# Patient Record
Sex: Female | Born: 1945
Health system: Southern US, Community
[De-identification: ages and names within clinical notes are randomized; demographics above are authoritative.]

## PROBLEM LIST (undated history)

## (undated) DIAGNOSIS — R570 Cardiogenic shock: Secondary | ICD-10-CM

## (undated) DIAGNOSIS — E039 Hypothyroidism, unspecified: Secondary | ICD-10-CM

## (undated) DIAGNOSIS — M1990 Unspecified osteoarthritis, unspecified site: Secondary | ICD-10-CM

## (undated) DIAGNOSIS — I10 Essential (primary) hypertension: Secondary | ICD-10-CM

## (undated) DIAGNOSIS — E785 Hyperlipidemia, unspecified: Secondary | ICD-10-CM

## (undated) DIAGNOSIS — E079 Disorder of thyroid, unspecified: Secondary | ICD-10-CM

## (undated) DIAGNOSIS — K635 Polyp of colon: Secondary | ICD-10-CM

## (undated) DIAGNOSIS — R112 Nausea with vomiting, unspecified: Secondary | ICD-10-CM

## (undated) DIAGNOSIS — J189 Pneumonia, unspecified organism: Secondary | ICD-10-CM

## (undated) DIAGNOSIS — Z9889 Other specified postprocedural states: Secondary | ICD-10-CM

## (undated) DIAGNOSIS — K5792 Diverticulitis of intestine, part unspecified, without perforation or abscess without bleeding: Secondary | ICD-10-CM

## (undated) DIAGNOSIS — N390 Urinary tract infection, site not specified: Secondary | ICD-10-CM

## (undated) HISTORY — DX: Disorder of thyroid, unspecified: E07.9

## (undated) HISTORY — DX: Urinary tract infection, site not specified: N39.0

## (undated) HISTORY — DX: Unspecified osteoarthritis, unspecified site: M19.90

## (undated) HISTORY — DX: Polyp of colon: K63.5

## (undated) HISTORY — DX: Diverticulitis of intestine, part unspecified, without perforation or abscess without bleeding: K57.92

## (undated) HISTORY — DX: Hyperlipidemia, unspecified: E78.5

## (undated) HISTORY — DX: Essential (primary) hypertension: I10

---

## 1991-11-03 HISTORY — PX: ABDOMINAL HYSTERECTOMY: SHX81

## 2000-11-02 HISTORY — PX: CHOLECYSTECTOMY: SHX55

## 2007-11-16 LAB — HM COLONOSCOPY: HM Colonoscopy: NORMAL

## 2009-02-18 ENCOUNTER — Encounter: Admission: RE | Admit: 2009-02-18 | Discharge: 2009-02-18 | Payer: Self-pay | Admitting: Family Medicine

## 2010-03-11 ENCOUNTER — Encounter: Admission: RE | Admit: 2010-03-11 | Discharge: 2010-03-11 | Payer: Self-pay | Admitting: Family Medicine

## 2011-02-10 ENCOUNTER — Other Ambulatory Visit: Payer: Self-pay | Admitting: Family Medicine

## 2011-02-10 DIAGNOSIS — Z1231 Encounter for screening mammogram for malignant neoplasm of breast: Secondary | ICD-10-CM

## 2011-03-16 ENCOUNTER — Ambulatory Visit
Admission: RE | Admit: 2011-03-16 | Discharge: 2011-03-16 | Disposition: A | Discharge status: Home / Self Care | Payer: BC Managed Care – PPO | Source: Ambulatory Visit | Attending: Family Medicine | Admitting: Family Medicine

## 2011-03-16 DIAGNOSIS — Z1231 Encounter for screening mammogram for malignant neoplasm of breast: Secondary | ICD-10-CM

## 2011-04-27 ENCOUNTER — Other Ambulatory Visit: Payer: Self-pay | Admitting: Otolaryngology

## 2011-04-27 DIAGNOSIS — J3800 Paralysis of vocal cords and larynx, unspecified: Secondary | ICD-10-CM

## 2011-04-30 ENCOUNTER — Ambulatory Visit
Admission: RE | Admit: 2011-04-30 | Discharge: 2011-04-30 | Disposition: A | Discharge status: Home / Self Care | Payer: BC Managed Care – PPO | Source: Ambulatory Visit | Attending: Otolaryngology | Admitting: Otolaryngology

## 2011-04-30 DIAGNOSIS — J3800 Paralysis of vocal cords and larynx, unspecified: Secondary | ICD-10-CM

## 2011-04-30 MED ORDER — IOHEXOL 300 MG/ML  SOLN
75.0000 mL | Freq: Once | INTRAMUSCULAR | Status: AC | PRN
  Administered 2011-04-30: 75 mL via INTRAVENOUS

## 2011-11-16 LAB — HM MAMMOGRAPHY: HM Mammogram: NEGATIVE

## 2012-02-17 ENCOUNTER — Other Ambulatory Visit: Payer: Self-pay | Admitting: Family Medicine

## 2012-02-17 DIAGNOSIS — Z1231 Encounter for screening mammogram for malignant neoplasm of breast: Secondary | ICD-10-CM

## 2012-03-18 ENCOUNTER — Ambulatory Visit
Admission: RE | Admit: 2012-03-18 | Discharge: 2012-03-18 | Disposition: A | Discharge status: Home / Self Care | Payer: Medicare Other | Source: Ambulatory Visit | Attending: Family Medicine | Admitting: Family Medicine

## 2012-03-18 DIAGNOSIS — Z1231 Encounter for screening mammogram for malignant neoplasm of breast: Secondary | ICD-10-CM

## 2012-03-22 ENCOUNTER — Other Ambulatory Visit: Payer: Self-pay | Admitting: Family Medicine

## 2012-03-22 DIAGNOSIS — R928 Other abnormal and inconclusive findings on diagnostic imaging of breast: Secondary | ICD-10-CM

## 2012-03-30 ENCOUNTER — Ambulatory Visit
Admission: RE | Admit: 2012-03-30 | Discharge: 2012-03-30 | Disposition: A | Discharge status: Home / Self Care | Payer: Medicare Other | Source: Ambulatory Visit | Attending: Family Medicine | Admitting: Family Medicine

## 2012-03-30 DIAGNOSIS — R928 Other abnormal and inconclusive findings on diagnostic imaging of breast: Secondary | ICD-10-CM

## 2012-09-15 ENCOUNTER — Other Ambulatory Visit: Payer: Self-pay | Admitting: Family Medicine

## 2012-09-15 DIAGNOSIS — Z780 Asymptomatic menopausal state: Secondary | ICD-10-CM

## 2012-09-20 ENCOUNTER — Ambulatory Visit
Admission: RE | Admit: 2012-09-20 | Discharge: 2012-09-20 | Disposition: A | Discharge status: Home / Self Care | Payer: Medicare Other | Source: Ambulatory Visit | Attending: Family Medicine | Admitting: Family Medicine

## 2012-09-20 DIAGNOSIS — Z780 Asymptomatic menopausal state: Secondary | ICD-10-CM

## 2012-11-15 ENCOUNTER — Encounter: Payer: Self-pay | Admitting: Family Medicine

## 2012-11-15 ENCOUNTER — Ambulatory Visit (INDEPENDENT_AMBULATORY_CARE_PROVIDER_SITE_OTHER): Payer: Medicare Other | Admitting: Family Medicine

## 2012-11-15 VITALS — BP 120/60 | HR 72 | Temp 98.5°F | Resp 12 | Ht 65.0 in | Wt 141.0 lb

## 2012-11-15 DIAGNOSIS — M899 Disorder of bone, unspecified: Secondary | ICD-10-CM

## 2012-11-15 DIAGNOSIS — K219 Gastro-esophageal reflux disease without esophagitis: Secondary | ICD-10-CM | POA: Insufficient documentation

## 2012-11-15 DIAGNOSIS — Z8719 Personal history of other diseases of the digestive system: Secondary | ICD-10-CM

## 2012-11-15 DIAGNOSIS — M1990 Unspecified osteoarthritis, unspecified site: Secondary | ICD-10-CM

## 2012-11-15 DIAGNOSIS — I10 Essential (primary) hypertension: Secondary | ICD-10-CM

## 2012-11-15 DIAGNOSIS — M8580 Other specified disorders of bone density and structure, unspecified site: Secondary | ICD-10-CM | POA: Insufficient documentation

## 2012-11-15 DIAGNOSIS — F411 Generalized anxiety disorder: Secondary | ICD-10-CM

## 2012-11-15 DIAGNOSIS — E039 Hypothyroidism, unspecified: Secondary | ICD-10-CM | POA: Insufficient documentation

## 2012-11-15 DIAGNOSIS — M949 Disorder of cartilage, unspecified: Secondary | ICD-10-CM

## 2012-11-15 DIAGNOSIS — F419 Anxiety disorder, unspecified: Secondary | ICD-10-CM

## 2012-11-15 DIAGNOSIS — E785 Hyperlipidemia, unspecified: Secondary | ICD-10-CM

## 2012-11-15 DIAGNOSIS — J019 Acute sinusitis, unspecified: Secondary | ICD-10-CM

## 2012-11-15 MED ORDER — AMOXICILLIN 875 MG PO TABS: 875.0000 mg | ORAL_TABLET | Freq: Two times a day (BID) | ORAL | Status: DC

## 2012-11-15 NOTE — Patient Instructions (Addendum)

## 2012-11-15 NOTE — Progress Notes (Signed)
  Subjective:    Patient ID: Tonya Hamilton, female    DOB: 01/14/1946, 67 y.o.   MRN: [id number]  HPI New to establish care. Patient has past medical history significant osteoarthritis mostly involving hands, diverticulitis, hypertension, hyperlipidemia with intolerance to many statins, benign colon polyps, hypothyroidism, recurrent UTI, and chronic anxiety. Previous cholecystectomy 2002. Total abdominal hysterectomy 1993 secondary to endometriosis.  Family history - both parents with hypertension and hyperlipidemia. Father had coronary disease in his 67s and history of stroke.  Patient is married. She is retired and previously did Orthoptist work. Nonsmoker. No alcohol use.  Acute issue of 2 week history of cough. Productive of yellow sputum. Intermittent chills but no fever. No dyspnea. She has fatigue and mild dizziness which is non-orthostatic related. Dizziness has improved past couple days. She apparently been taking lisinopril 40 mg 2 daily and she reduced this on her own to once daily and symptoms have improved. Good fluid intake. No nausea or vomiting. No diarrhea. Nonsmoker. No history of wheezing. No GERD symptoms. Right maxillary sinus pressure.   Review of Systems  Constitutional: Positive for chills and fatigue. Negative for fever.  HENT: Positive for congestion and sinus pressure.   Respiratory: Positive for cough. Negative for shortness of breath and wheezing.   Cardiovascular: Negative for chest pain and leg swelling.  Gastrointestinal: Negative for abdominal pain.  Genitourinary: Negative for dysuria.  Musculoskeletal: Positive for arthralgias. Negative for gait problem.  Skin: Negative for rash.  Neurological: Positive for dizziness. Negative for syncope and weakness.  Hematological: Negative for adenopathy.  Psychiatric/Behavioral: Negative for dysphoric mood.       Objective:   Physical Exam  Constitutional: She appears well-developed and well-nourished.  HENT:    Right Ear: External ear normal.  Left Ear: External ear normal.  Mouth/Throat: Oropharynx is clear and moist.  Neck: Neck supple. No thyromegaly present.  Cardiovascular: Normal rate and regular rhythm.   Pulmonary/Chest: Effort normal and breath sounds normal. No respiratory distress. She has no wheezes. She has no rales.  Musculoskeletal: She exhibits no edema.  Lymphadenopathy:    She has no cervical adenopathy.          Assessment & Plan:  #1 cough. Question secondary to acute sinusitis. Given duration of symptoms start amoxicillin 875 mg twice daily for 10 days. #2 history of diverticulitis  #3 hypertension stable  #4 history of hyperlipidemia with reported intolerance to multiple statins #5 hypothyroidism. Patient reportedly had labs in October of this year with physical and TSH normal-per patient. #6 history of benign colon polyps  #7 history of osteoarthritis mostly involving hands

## 2013-01-26 ENCOUNTER — Other Ambulatory Visit: Payer: Self-pay | Admitting: Family Medicine

## 2013-02-20 ENCOUNTER — Ambulatory Visit (INDEPENDENT_AMBULATORY_CARE_PROVIDER_SITE_OTHER): Payer: Medicare Other | Admitting: Family Medicine

## 2013-02-20 ENCOUNTER — Ambulatory Visit (INDEPENDENT_AMBULATORY_CARE_PROVIDER_SITE_OTHER)
Admission: RE | Admit: 2013-02-20 | Discharge: 2013-02-20 | Disposition: A | Discharge status: Home / Self Care | Payer: Medicare Other | Source: Ambulatory Visit | Attending: Family Medicine | Admitting: Family Medicine

## 2013-02-20 ENCOUNTER — Encounter: Payer: Self-pay | Admitting: Family Medicine

## 2013-02-20 VITALS — BP 150/64 | Temp 98.6°F

## 2013-02-20 DIAGNOSIS — R300 Dysuria: Secondary | ICD-10-CM

## 2013-02-20 DIAGNOSIS — M1611 Unilateral primary osteoarthritis, right hip: Secondary | ICD-10-CM

## 2013-02-20 DIAGNOSIS — M5416 Radiculopathy, lumbar region: Secondary | ICD-10-CM

## 2013-02-20 DIAGNOSIS — IMO0002 Reserved for concepts with insufficient information to code with codable children: Secondary | ICD-10-CM

## 2013-02-20 DIAGNOSIS — M169 Osteoarthritis of hip, unspecified: Secondary | ICD-10-CM

## 2013-02-20 DIAGNOSIS — R109 Unspecified abdominal pain: Secondary | ICD-10-CM

## 2013-02-20 LAB — POCT URINALYSIS DIPSTICK
Bilirubin, UA: NEGATIVE
Glucose, UA: NEGATIVE
Ketones, UA: NEGATIVE
Protein, UA: NEGATIVE
pH, UA: 6

## 2013-02-20 MED ORDER — CIPROFLOXACIN HCL 250 MG PO TABS: 250.0000 mg | ORAL_TABLET | Freq: Two times a day (BID) | ORAL | Status: DC

## 2013-02-20 NOTE — Progress Notes (Signed)
  Subjective:    Patient ID: Tonya Hamilton, female    DOB: [birth year], 67 y.o.   MRN: [id number]  HPI Several week hx of right buttock pain radiating down RLE. Sharp pain.  Radiates all way to ankle at times. No weakness.  No numbness. Pain worse rolling over in bed.  Tried thermal OTC skin patch with some relief. No recent appetite or weight changes.  Patient also has some mild dysuria. Noted past couple days. She had some chills last night but no definite fever. She has had some mild suprapubic discomfort. No antibiotic allergies. No nausea or vomiting.  Past Medical History  Diagnosis Date  . Arthritis   . Diverticulitis   . Hypertension   . Hyperlipidemia   . Thyroid disease   . UTI (urinary tract infection)   . Colon polyps    Past Surgical History  Procedure Laterality Date  . Cholecystectomy  2002  . Abdominal hysterectomy  1993    TAH, endometriosis    reports that she has never smoked. She does not have any smokeless tobacco history on file. Her alcohol and drug histories are not on file. family history includes Arthritis in her mother; Heart disease in her father and mother; Hypertension in her father and mother; and Stroke in her father. Allergies  Allergen Reactions  . Statins     Body aches all over      Review of Systems  Constitutional: Negative for fever, appetite change and unexpected weight change.  Gastrointestinal: Negative for nausea and vomiting.  Genitourinary: Positive for dysuria. Negative for hematuria and flank pain.  Musculoskeletal: Positive for back pain.  Neurological: Negative for weakness and numbness.       Objective:   Physical Exam  Constitutional: She appears well-developed and well-nourished. No distress.  Cardiovascular: Normal rate and regular rhythm.   Pulmonary/Chest: Effort normal and breath sounds normal. No respiratory distress. She has no wheezes. She has no rales.  Musculoskeletal: She exhibits no edema.  Straight  leg raise are negative  Neurological:  Symmetric reflexes ankle and knee bilaterally. Full strength lower extremities. Normal sensory function throughout          Assessment & Plan:  Mild dysuria. Rule out UTI. Urine culture sent. Cipro 250 mg twice daily pending culture results  Right lumbar radiculopathy symptoms. Duration over 2 months. No history of injury. Nonfocal neuro exam. Start with plain lumbar films. Consider trial of physical therapy. May eventually need MRI scan to further assess  X-rays reveal degenerative changes.  Severe degenerative arthritis right hip.  I have discussed with patient.  I still think her symptoms above c/w with lumbar radiculopathy but she does describe some progressive difficulties with right hip pain in recent months.

## 2013-02-22 ENCOUNTER — Ambulatory Visit: Payer: Medicare Other | Admitting: Family Medicine

## 2013-02-23 LAB — URINE CULTURE

## 2013-02-24 NOTE — Progress Notes (Signed)
Quick Note:  Pt called back to report ongoing burning sensation. She has one Cipro to take tonight and then she will be out. ______

## 2013-02-24 NOTE — Progress Notes (Signed)
Quick Note:  LMTCB on personally identified VM ______

## 2013-04-28 ENCOUNTER — Encounter: Payer: Self-pay | Admitting: Family Medicine

## 2013-04-28 ENCOUNTER — Ambulatory Visit (INDEPENDENT_AMBULATORY_CARE_PROVIDER_SITE_OTHER): Payer: Medicare Other | Admitting: Family Medicine

## 2013-04-28 VITALS — BP 130/70 | HR 84 | Temp 98.5°F | Resp 20 | Wt 148.0 lb

## 2013-04-28 DIAGNOSIS — R0989 Other specified symptoms and signs involving the circulatory and respiratory systems: Secondary | ICD-10-CM

## 2013-04-28 DIAGNOSIS — I10 Essential (primary) hypertension: Secondary | ICD-10-CM

## 2013-04-28 DIAGNOSIS — Z01818 Encounter for other preprocedural examination: Secondary | ICD-10-CM

## 2013-04-28 DIAGNOSIS — E785 Hyperlipidemia, unspecified: Secondary | ICD-10-CM

## 2013-04-28 NOTE — Progress Notes (Signed)
  Subjective:    Patient ID: Tonya Hamilton, female    DOB: September 30, 1946, 67 y.o.   MRN: [id number]  HPI Is here for preoperative visit. Scheduled for total hip  replacement in July. She has history of hypertension treated with lisinopril. She has hypothyroidism on levothyroxin. GERD controlled with omeprazole. Nonsmoker. No history of peripheral vascular disease or CAD. No chronic respiratory difficulties. Blood pressures have been stable. Denies recent chest pains. No syncope. No dizziness. No recent TIA-type symptoms.  Father had coronary disease age 66 mother had MI 41.  Past Medical History  Diagnosis Date  . Arthritis   . Diverticulitis   . Hypertension   . Hyperlipidemia   . Thyroid disease   . UTI (urinary tract infection)   . Colon polyps    Past Surgical History  Procedure Laterality Date  . Cholecystectomy  2002  . Abdominal hysterectomy  1993    TAH, endometriosis    reports that she has never smoked. She does not have any smokeless tobacco history on file. Her alcohol and drug histories are not on file. family history includes Arthritis in her mother; Heart disease in her father and mother; Hypertension in her father and mother; and Stroke in her father. Allergies  Allergen Reactions  . Statins     Body aches all over      Review of Systems  Constitutional: Negative for fatigue.  Eyes: Negative for visual disturbance.  Respiratory: Negative for cough, chest tightness, shortness of breath and wheezing.   Cardiovascular: Negative for chest pain, palpitations and leg swelling.  Genitourinary: Negative for dysuria.  Neurological: Negative for dizziness, seizures, syncope, weakness, light-headedness and headaches.  Hematological: Negative for adenopathy. Does not bruise/bleed easily.  Psychiatric/Behavioral: Negative for dysphoric mood.       Objective:   Physical Exam  Constitutional: She appears well-developed and well-nourished.  HENT:  Mouth/Throat:  Oropharynx is clear and moist.  Eyes: Pupils are equal, round, and reactive to light.  Neck: Neck supple.  Patient has soft left carotid bruit. None noted on the right.  Cardiovascular: Normal rate and regular rhythm.   Pulmonary/Chest: Breath sounds normal. No respiratory distress. She has no wheezes. She has no rales.  Musculoskeletal: She exhibits no edema.          Assessment & Plan:  Preoperative assessment for total hip replacement. Obtain EKG. Consider carotid Dopplers to assess left carotid bruit  EKG NSR with no acute changes.

## 2013-05-01 ENCOUNTER — Telehealth: Payer: Self-pay | Admitting: Family Medicine

## 2013-05-01 NOTE — Telephone Encounter (Signed)
error 

## 2013-05-02 ENCOUNTER — Encounter (INDEPENDENT_AMBULATORY_CARE_PROVIDER_SITE_OTHER): Payer: Medicare Other

## 2013-05-02 DIAGNOSIS — I6529 Occlusion and stenosis of unspecified carotid artery: Secondary | ICD-10-CM

## 2013-05-02 DIAGNOSIS — R0989 Other specified symptoms and signs involving the circulatory and respiratory systems: Secondary | ICD-10-CM

## 2013-05-15 ENCOUNTER — Ambulatory Visit: Payer: Medicare Other | Admitting: Family Medicine

## 2013-05-21 ENCOUNTER — Other Ambulatory Visit: Payer: Self-pay | Admitting: Orthopedic Surgery

## 2013-05-21 MED ORDER — DEXAMETHASONE SODIUM PHOSPHATE 10 MG/ML IJ SOLN: 10.0000 mg | Freq: Once | INTRAMUSCULAR | Status: DC

## 2013-05-21 MED ORDER — BUPIVACAINE LIPOSOME 1.3 % IJ SUSP: 20.0000 mL | Freq: Once | INTRAMUSCULAR | Status: DC

## 2013-05-21 NOTE — Progress Notes (Signed)
Preoperative surgical orders have been place into the Epic hospital system for Hunt Oris on 05/21/2013, 10:11 AM  by Patrica Duel for surgery on 05/31/13.  Preop Total Hip - Anterior Approach orders including Experel Injecion, PO Tylenol, and IV Decadron as long as there are no contraindications to the above medications. Avel Peace, PA-C

## 2013-05-22 ENCOUNTER — Encounter (HOSPITAL_COMMUNITY): Payer: Self-pay | Admitting: Pharmacy Technician

## 2013-05-24 NOTE — Patient Instructions (Signed)
Tonya Hamilton  05/24/2013   Your procedure is scheduled on:  05/31/13              Surgery 230pm-430pm  Report to [facility] at   1200noon  Call this number if you have problems the morning of surgery: [phone]   Remember:   Do not eat food after midnite.  May have clear liquids until 0730am then npo.    Take these medicines the morning of surgery with A SIP OF WATER:    Do not wear jewelry, make-up or nail polish.  Do not wear lotions, powders, or perfumes.   Do not shave 48 hours prior to surgery.   Do not bring valuables to the hospital.  Contacts, dentures or bridgework may not be worn into surgery.  Leave suitcase in the car. After surgery it may be brought to your room.  For patients admitted to the hospital, checkout time is 11:00 AM the day of  discharge.    SEE CHG INSTRUCTION SHEET    Please read over the following fact sheets that you were given: MRSA Information, coughing and deep breathing exercises, leg exercises, Blood Transfusion Fact Sheet, Incentive Spirometry Fact Sheet                Failure to comply with these instructions may result in cancellation of your surgery.                Patient Signature ____________________________              Nurse Signature _____________________________

## 2013-05-25 ENCOUNTER — Encounter (HOSPITAL_COMMUNITY): Payer: Self-pay

## 2013-05-25 ENCOUNTER — Ambulatory Visit (HOSPITAL_COMMUNITY)
Admission: RE | Admit: 2013-05-25 | Discharge: 2013-05-25 | Disposition: A | Discharge status: Home / Self Care | Payer: Medicare Other | Source: Ambulatory Visit | Attending: Orthopedic Surgery | Admitting: Orthopedic Surgery

## 2013-05-25 ENCOUNTER — Encounter (HOSPITAL_COMMUNITY)
Admission: RE | Admit: 2013-05-25 | Discharge: 2013-05-25 | Disposition: A | Discharge status: Home / Self Care | Payer: Medicare Other | Source: Ambulatory Visit | Attending: Orthopedic Surgery | Admitting: Orthopedic Surgery

## 2013-05-25 DIAGNOSIS — Z01812 Encounter for preprocedural laboratory examination: Secondary | ICD-10-CM | POA: Insufficient documentation

## 2013-05-25 DIAGNOSIS — Z01818 Encounter for other preprocedural examination: Secondary | ICD-10-CM | POA: Insufficient documentation

## 2013-05-25 DIAGNOSIS — Z0183 Encounter for blood typing: Secondary | ICD-10-CM | POA: Insufficient documentation

## 2013-05-25 DIAGNOSIS — I700 Atherosclerosis of aorta: Secondary | ICD-10-CM | POA: Insufficient documentation

## 2013-05-25 HISTORY — DX: Nausea with vomiting, unspecified: R11.2

## 2013-05-25 HISTORY — DX: Other specified postprocedural states: Z98.890

## 2013-05-25 HISTORY — DX: Hypothyroidism, unspecified: E03.9

## 2013-05-25 LAB — CBC
MCHC: 32.8 g/dL (ref 30.0–36.0)
RDW: 15.2 % (ref 11.5–15.5)

## 2013-05-25 LAB — SURGICAL PCR SCREEN: Staphylococcus aureus: POSITIVE — AB

## 2013-05-25 LAB — COMPREHENSIVE METABOLIC PANEL
ALT: 19 U/L (ref 0–35)
Albumin: 3.9 g/dL (ref 3.5–5.2)
Alkaline Phosphatase: 69 U/L (ref 39–117)
BUN: 12 mg/dL (ref 6–23)
Potassium: 3.7 mEq/L (ref 3.5–5.1)
Sodium: 142 mEq/L (ref 135–145)
Total Protein: 7 g/dL (ref 6.0–8.3)

## 2013-05-25 LAB — URINALYSIS, ROUTINE W REFLEX MICROSCOPIC
Bilirubin Urine: NEGATIVE
Nitrite: NEGATIVE
Protein, ur: NEGATIVE mg/dL
Specific Gravity, Urine: 1.019 (ref 1.005–1.030)
Urobilinogen, UA: 0.2 mg/dL (ref 0.0–1.0)

## 2013-05-25 LAB — PROTIME-INR: Prothrombin Time: 11.8 seconds (ref 11.6–15.2)

## 2013-05-25 LAB — APTT: aPTT: 27 seconds (ref 24–37)

## 2013-05-25 LAB — URINE MICROSCOPIC-ADD ON

## 2013-05-26 ENCOUNTER — Other Ambulatory Visit: Payer: Self-pay

## 2013-05-26 MED ORDER — MUPIROCIN CALCIUM 2 % NA OINT: 1.0000 "application " | TOPICAL_OINTMENT | Freq: Two times a day (BID) | NASAL | Status: DC

## 2013-05-28 ENCOUNTER — Other Ambulatory Visit: Payer: Self-pay | Admitting: Surgical

## 2013-05-28 NOTE — H&P (Signed)
TOTAL HIP ADMISSION H&P  Patient is admitted for right total hip arthroplasty.  Subjective:  Chief Complaint: right hip pain  HPI: Tonya Hamilton, 67 y.o. female, has a history of pain and functional disability in the right hip(s) due to arthritis and patient has failed non-surgical conservative treatments for greater than 12 weeks to include NSAID's and/or analgesics, corticosteriod injections and activity modification.  Onset of symptoms was gradual starting 1 year ago with gradually worsening course since that time.The patient noted no past surgery on the right hip(s).  Patient currently rates pain in the right hip at 6 out of 10 with activity. Patient has night pain, worsening of pain with activity and weight bearing, pain that interfers with activities of daily living and pain with passive range of motion. Patient has evidence of subchondral sclerosis, periarticular osteophytes and joint space narrowing by imaging studies. This condition presents safety issues increasing the risk of falls.  There is no current active infection.  Patient Active Problem List   Diagnosis Date Noted  . Osteoarthritis of right hip 02/20/2013  . History of colonic diverticulitis 11/15/2012  . Osteoarthritis 11/15/2012  . Hypertension 11/15/2012  . Hyperlipidemia 11/15/2012  . Hypothyroid 11/15/2012  . GERD (gastroesophageal reflux disease) 11/15/2012  . Osteopenia 11/15/2012  . Anxiety 11/15/2012   Past Medical History  Diagnosis Date  . Arthritis   . Diverticulitis   . Hypertension   . Hyperlipidemia   . Thyroid disease   . UTI (urinary tract infection)   . Colon polyps   . PONV (postoperative nausea and vomiting)   . Hypothyroidism     Past Surgical History  Procedure Laterality Date  . Cholecystectomy  2002  . Abdominal hysterectomy  1993    TAH, endometriosis     Current outpatient prescriptions: alendronate (FOSAMAX) 70 MG tablet, Take 70 mg by mouth every 7 (seven) days. Take with a  full glass of water on an empty stomach., Disp: , Rfl: ;  levothyroxine (SYNTHROID, LEVOTHROID) 25 MCG tablet, Take 25 mcg by mouth daily before breakfast., Disp: , Rfl: ;  lisinopril (PRINIVIL,ZESTRIL) 40 MG tablet, Take 40 mg by mouth every evening. , Disp: , Rfl:  mupirocin nasal ointment (BACTROBAN) 2 %, Place 1 application into the nose 2 (two) times daily. Use one-half of tube in each nostril twice daily for five (5) days. After application, press sides of nose together and gently massage., Disp: 10 g, Rfl: 0;  omeprazole (PRILOSEC) 20 MG capsule, Take 20 mg by mouth daily. , Disp: , Rfl: ;  PREMARIN vaginal cream, Place 0.5 g vaginally once a week. No certain day, Disp: , Rfl:   Allergies  Allergen Reactions  . Statins     Body aches all over    History  Substance Use Topics  . Smoking status: Never Smoker   . Smokeless tobacco: Never Used  . Alcohol Use: No    Family History  Problem Relation Age of Onset  . Arthritis Mother   . Hypertension Mother   . Heart disease Mother   . Hypertension Father   . Stroke Father   . Heart disease Father      Review of Systems  Constitutional: Negative.   HENT: Negative.  Negative for neck pain.   Eyes: Negative.   Respiratory: Negative.   Cardiovascular: Negative.   Gastrointestinal: Negative.   Genitourinary: Negative.   Musculoskeletal: Positive for joint pain. Negative for myalgias, back pain and falls.       Right hip pain  Skin: Negative.   Neurological: Negative.   Endo/Heme/Allergies: Negative.   Psychiatric/Behavioral: Negative.     Objective:  Physical Exam  Constitutional: She is oriented to person, place, and time. She appears well-developed and well-nourished. No distress.  HENT:  Head: Normocephalic and atraumatic.  Right Ear: External ear normal.  Left Ear: External ear normal.  Mouth/Throat: Oropharynx is clear and moist.  Eyes: Conjunctivae and EOM are normal.  Neck: Normal range of motion. Neck supple. No  tracheal deviation present.  Cardiovascular: Normal rate, regular rhythm, normal heart sounds and intact distal pulses.   No murmur heard. Respiratory: Effort normal and breath sounds normal. No respiratory distress. She has no wheezes. She exhibits no tenderness.  GI: Soft. Bowel sounds are normal. She exhibits no distension and no mass. There is no tenderness.  Musculoskeletal:       Right hip: She exhibits decreased range of motion and decreased strength.       Left hip: Normal.       Right knee: Normal.       Left knee: Normal.       Right lower leg: She exhibits no tenderness and no swelling.       Left lower leg: She exhibits no tenderness and no swelling.  The opposite left hip side, she has excellent motion with flexion at 130 internal rotation of 40 degrees, external rotation 45, abduction 45. The right hip has reduced range of motion with only flexion to about 115. Internal rotation is reduced to 20. External rotation is very limited, only about 10 degrees. Abduction is still pretty good at 40 degrees. She does have a little bit of discomfort on that external rotation endpoint at 10 degrees.  Lymphadenopathy:    She has no cervical adenopathy.  Neurological: She is alert and oriented to person, place, and time. She has normal strength and normal reflexes. No sensory deficit.  Skin: No rash noted. She is not diaphoretic. No erythema.  Psychiatric: She has a normal mood and affect. Her behavior is normal.    Vitals Pulse: 84 (Regular) BP: 138/76 (Sitting, Left Arm, Standard)  Estimated body mass index is 23.46 kg/(m^2) as calculated from the following:   Height as of 11/15/12: 5\' 5"  (1.651 m).   Weight as of 11/15/12: 63.957 kg (141 lb).   Imaging Review Plain radiographs demonstrate severe degenerative joint disease of the right hip(s). The bone quality appears to be fair for age and reported activity level.  Assessment/Plan:  End stage arthritis, right  hip(s)  The patient history, physical examination, clinical judgement of the provider and imaging studies are consistent with end stage degenerative joint disease of the right hip(s) and total hip arthroplasty is deemed medically necessary. The treatment options including medical management, injection therapy, arthroscopy and arthroplasty were discussed at length. The risks and benefits of total hip arthroplasty were presented and reviewed. The risks due to aseptic loosening, infection, stiffness, dislocation/subluxation,  thromboembolic complications and other imponderables were discussed.  The patient acknowledged the explanation, agreed to proceed with the plan and consent was signed. Patient is being admitted for inpatient treatment for surgery, pain control, PT, OT, prophylactic antibiotics, VTE prophylaxis, progressive ambulation and ADL's and discharge planning.The patient is planning to be discharged home with home health services    [place], [place]

## 2013-05-31 ENCOUNTER — Inpatient Hospital Stay (HOSPITAL_COMMUNITY): Payer: Medicare Other

## 2013-05-31 ENCOUNTER — Encounter (HOSPITAL_COMMUNITY)
Admission: RE | Disposition: A | Discharge status: Home Health | Payer: Self-pay | Source: Ambulatory Visit | Attending: Orthopedic Surgery

## 2013-05-31 ENCOUNTER — Encounter (HOSPITAL_COMMUNITY): Payer: Self-pay | Admitting: *Deleted

## 2013-05-31 ENCOUNTER — Ambulatory Visit (HOSPITAL_COMMUNITY): Payer: Medicare Other | Admitting: Anesthesiology

## 2013-05-31 ENCOUNTER — Encounter (HOSPITAL_COMMUNITY): Payer: Self-pay | Admitting: Anesthesiology

## 2013-05-31 ENCOUNTER — Inpatient Hospital Stay (HOSPITAL_COMMUNITY)
Admission: RE | Admit: 2013-05-31 | Discharge: 2013-06-02 | DRG: 470 | Disposition: A | Discharge status: Home Health | Payer: Medicare Other | Source: Ambulatory Visit | Attending: Orthopedic Surgery | Admitting: Orthopedic Surgery

## 2013-05-31 ENCOUNTER — Ambulatory Visit (HOSPITAL_COMMUNITY): Payer: Medicare Other

## 2013-05-31 DIAGNOSIS — M1611 Unilateral primary osteoarthritis, right hip: Secondary | ICD-10-CM

## 2013-05-31 DIAGNOSIS — M949 Disorder of cartilage, unspecified: Secondary | ICD-10-CM | POA: Diagnosis present

## 2013-05-31 DIAGNOSIS — E039 Hypothyroidism, unspecified: Secondary | ICD-10-CM | POA: Diagnosis present

## 2013-05-31 DIAGNOSIS — K219 Gastro-esophageal reflux disease without esophagitis: Secondary | ICD-10-CM | POA: Diagnosis present

## 2013-05-31 DIAGNOSIS — E785 Hyperlipidemia, unspecified: Secondary | ICD-10-CM | POA: Diagnosis present

## 2013-05-31 DIAGNOSIS — F411 Generalized anxiety disorder: Secondary | ICD-10-CM | POA: Diagnosis present

## 2013-05-31 DIAGNOSIS — M169 Osteoarthritis of hip, unspecified: Principal | ICD-10-CM | POA: Diagnosis present

## 2013-05-31 DIAGNOSIS — Z96649 Presence of unspecified artificial hip joint: Secondary | ICD-10-CM

## 2013-05-31 DIAGNOSIS — M1610 Unilateral primary osteoarthritis, unspecified hip: Principal | ICD-10-CM | POA: Diagnosis present

## 2013-05-31 DIAGNOSIS — M899 Disorder of bone, unspecified: Secondary | ICD-10-CM | POA: Diagnosis present

## 2013-05-31 DIAGNOSIS — I10 Essential (primary) hypertension: Secondary | ICD-10-CM | POA: Diagnosis present

## 2013-05-31 HISTORY — PX: TOTAL HIP ARTHROPLASTY: SHX124

## 2013-05-31 LAB — TYPE AND SCREEN: Antibody Screen: NEGATIVE

## 2013-05-31 SURGERY — ARTHROPLASTY, HIP, TOTAL, ANTERIOR APPROACH
Anesthesia: General | Site: Hip | Laterality: Right | Wound class: Clean

## 2013-05-31 MED ORDER — BUPIVACAINE LIPOSOME 1.3 % IJ SUSP
20.0000 mL | Freq: Once | INTRAMUSCULAR | Status: DC
  Filled 2013-05-31: qty 20

## 2013-05-31 MED ORDER — METHOCARBAMOL 100 MG/ML IJ SOLN
500.0000 mg | Freq: Four times a day (QID) | INTRAVENOUS | Status: DC | PRN
  Administered 2013-05-31: 500 mg via INTRAVENOUS
  Filled 2013-05-31 (×2): qty 5

## 2013-05-31 MED ORDER — ZOLPIDEM TARTRATE 5 MG PO TABS
5.0000 mg | ORAL_TABLET | Freq: Every evening | ORAL | Status: DC | PRN
  Administered 2013-05-31: 5 mg via ORAL
  Filled 2013-05-31: qty 1

## 2013-05-31 MED ORDER — BISACODYL 10 MG RE SUPP: 10.0000 mg | Freq: Every day | RECTAL | Status: DC | PRN

## 2013-05-31 MED ORDER — PROPOFOL INFUSION 10 MG/ML OPTIME
INTRAVENOUS | Status: DC | PRN
  Administered 2013-05-31 (×2): 100 ug/kg/min via INTRAVENOUS

## 2013-05-31 MED ORDER — CEFAZOLIN SODIUM 1-5 GM-% IV SOLN
1.0000 g | Freq: Four times a day (QID) | INTRAVENOUS | Status: AC
  Administered 2013-05-31 – 2013-06-01 (×2): 1 g via INTRAVENOUS
  Filled 2013-05-31 (×2): qty 50

## 2013-05-31 MED ORDER — 0.9 % SODIUM CHLORIDE (POUR BTL) OPTIME
TOPICAL | Status: DC | PRN
  Administered 2013-05-31: 1000 mL

## 2013-05-31 MED ORDER — MENTHOL 3 MG MT LOZG: 1.0000 | LOZENGE | OROMUCOSAL | Status: DC | PRN

## 2013-05-31 MED ORDER — FLEET ENEMA 7-19 GM/118ML RE ENEM: 1.0000 | ENEMA | Freq: Once | RECTAL | Status: AC | PRN

## 2013-05-31 MED ORDER — ONDANSETRON HCL 4 MG/2ML IJ SOLN
4.0000 mg | Freq: Four times a day (QID) | INTRAMUSCULAR | Status: DC | PRN
  Administered 2013-05-31: 4 mg via INTRAVENOUS
  Filled 2013-05-31: qty 2

## 2013-05-31 MED ORDER — BUPIVACAINE HCL 0.25 % IJ SOLN
INTRAMUSCULAR | Status: DC | PRN
  Administered 2013-05-31: 20 mL

## 2013-05-31 MED ORDER — SODIUM CHLORIDE 0.9 % IJ SOLN
INTRAMUSCULAR | Status: DC | PRN
  Administered 2013-05-31: 16:00:00

## 2013-05-31 MED ORDER — DEXAMETHASONE SODIUM PHOSPHATE 10 MG/ML IJ SOLN
10.0000 mg | Freq: Every day | INTRAMUSCULAR | Status: AC
  Filled 2013-05-31: qty 1

## 2013-05-31 MED ORDER — TRANEXAMIC ACID 100 MG/ML IV SOLN
1000.0000 mg | INTRAVENOUS | Status: AC
  Administered 2013-05-31: 1000 mg via INTRAVENOUS
  Filled 2013-05-31: qty 10

## 2013-05-31 MED ORDER — TRAMADOL HCL 50 MG PO TABS: 50.0000 mg | ORAL_TABLET | Freq: Four times a day (QID) | ORAL | Status: DC | PRN

## 2013-05-31 MED ORDER — ONDANSETRON HCL 4 MG/2ML IJ SOLN
INTRAMUSCULAR | Status: DC | PRN
  Administered 2013-05-31: 4 mg via INTRAVENOUS

## 2013-05-31 MED ORDER — METOCLOPRAMIDE HCL 10 MG PO TABS: 5.0000 mg | ORAL_TABLET | Freq: Three times a day (TID) | ORAL | Status: DC | PRN

## 2013-05-31 MED ORDER — FENTANYL CITRATE 0.05 MG/ML IJ SOLN
INTRAMUSCULAR | Status: DC | PRN
  Administered 2013-05-31: 25 ug via INTRAVENOUS
  Administered 2013-05-31: 50 ug via INTRAVENOUS

## 2013-05-31 MED ORDER — EPHEDRINE SULFATE 50 MG/ML IJ SOLN
INTRAMUSCULAR | Status: DC | PRN
  Administered 2013-05-31 (×2): 10 mg via INTRAVENOUS

## 2013-05-31 MED ORDER — ACETAMINOPHEN 500 MG PO TABS
1000.0000 mg | ORAL_TABLET | Freq: Once | ORAL | Status: AC
  Administered 2013-05-31: 1000 mg via ORAL
  Filled 2013-05-31: qty 2

## 2013-05-31 MED ORDER — PROMETHAZINE HCL 25 MG/ML IJ SOLN: 6.2500 mg | INTRAMUSCULAR | Status: DC | PRN

## 2013-05-31 MED ORDER — PANTOPRAZOLE SODIUM 40 MG PO TBEC
40.0000 mg | DELAYED_RELEASE_TABLET | Freq: Every day | ORAL | Status: DC
  Filled 2013-05-31: qty 1

## 2013-05-31 MED ORDER — CEFAZOLIN SODIUM-DEXTROSE 2-3 GM-% IV SOLR
INTRAVENOUS | Status: AC
  Filled 2013-05-31: qty 50

## 2013-05-31 MED ORDER — LEVOTHYROXINE SODIUM 25 MCG PO TABS
25.0000 ug | ORAL_TABLET | Freq: Every day | ORAL | Status: DC
  Administered 2013-06-01 – 2013-06-02 (×2): 25 ug via ORAL
  Filled 2013-05-31 (×3): qty 1

## 2013-05-31 MED ORDER — PHENOL 1.4 % MT LIQD: 1.0000 | OROMUCOSAL | Status: DC | PRN

## 2013-05-31 MED ORDER — CEFAZOLIN SODIUM-DEXTROSE 2-3 GM-% IV SOLR
2.0000 g | INTRAVENOUS | Status: AC
  Administered 2013-05-31: 2 g via INTRAVENOUS

## 2013-05-31 MED ORDER — MUPIROCIN CALCIUM 2 % NA OINT
1.0000 "application " | TOPICAL_OINTMENT | Freq: Two times a day (BID) | NASAL | Status: DC
  Administered 2013-06-01: 1 via NASAL
  Filled 2013-05-31 (×29): qty 1

## 2013-05-31 MED ORDER — OXYCODONE HCL 5 MG PO TABS
5.0000 mg | ORAL_TABLET | ORAL | Status: DC | PRN
  Administered 2013-05-31: 5 mg via ORAL
  Administered 2013-05-31 – 2013-06-01 (×2): 10 mg via ORAL
  Administered 2013-06-01: 5 mg via ORAL
  Administered 2013-06-01 – 2013-06-02 (×5): 10 mg via ORAL
  Filled 2013-05-31 (×2): qty 2
  Filled 2013-05-31: qty 1
  Filled 2013-05-31 (×5): qty 2

## 2013-05-31 MED ORDER — KETOROLAC TROMETHAMINE 15 MG/ML IJ SOLN
INTRAMUSCULAR | Status: AC
  Filled 2013-05-31: qty 1

## 2013-05-31 MED ORDER — POTASSIUM CHLORIDE IN NACL 20-0.45 MEQ/L-% IV SOLN
INTRAVENOUS | Status: DC
  Administered 2013-05-31: 18:00:00 via INTRAVENOUS
  Filled 2013-05-31 (×2): qty 1000

## 2013-05-31 MED ORDER — DEXAMETHASONE SODIUM PHOSPHATE 10 MG/ML IJ SOLN
INTRAMUSCULAR | Status: DC | PRN
  Administered 2013-05-31: 10 mg via INTRAVENOUS

## 2013-05-31 MED ORDER — PHENYLEPHRINE HCL 10 MG/ML IJ SOLN
INTRAMUSCULAR | Status: DC | PRN
  Administered 2013-05-31 (×3): 80 ug via INTRAVENOUS

## 2013-05-31 MED ORDER — SODIUM CHLORIDE 0.9 % IV SOLN: INTRAVENOUS | Status: DC

## 2013-05-31 MED ORDER — ACETAMINOPHEN 500 MG PO TABS
1000.0000 mg | ORAL_TABLET | Freq: Four times a day (QID) | ORAL | Status: AC
  Administered 2013-05-31 – 2013-06-01 (×3): 1000 mg via ORAL
  Filled 2013-05-31 (×3): qty 2

## 2013-05-31 MED ORDER — CHLORHEXIDINE GLUCONATE 4 % EX LIQD
60.0000 mL | Freq: Once | CUTANEOUS | Status: DC
  Filled 2013-05-31: qty 60

## 2013-05-31 MED ORDER — MIDAZOLAM HCL 5 MG/5ML IJ SOLN
INTRAMUSCULAR | Status: DC | PRN
  Administered 2013-05-31: 2 mg via INTRAVENOUS

## 2013-05-31 MED ORDER — ONDANSETRON HCL 4 MG PO TABS: 4.0000 mg | ORAL_TABLET | Freq: Four times a day (QID) | ORAL | Status: DC | PRN

## 2013-05-31 MED ORDER — BUPIVACAINE HCL (PF) 0.25 % IJ SOLN
INTRAMUSCULAR | Status: AC
  Filled 2013-05-31: qty 30

## 2013-05-31 MED ORDER — KETOROLAC TROMETHAMINE 15 MG/ML IJ SOLN
7.5000 mg | Freq: Four times a day (QID) | INTRAMUSCULAR | Status: AC | PRN
  Administered 2013-05-31: 7.5 mg via INTRAVENOUS

## 2013-05-31 MED ORDER — POLYETHYLENE GLYCOL 3350 17 G PO PACK: 17.0000 g | PACK | Freq: Every day | ORAL | Status: DC | PRN

## 2013-05-31 MED ORDER — DEXAMETHASONE 6 MG PO TABS
10.0000 mg | ORAL_TABLET | Freq: Every day | ORAL | Status: AC
  Administered 2013-06-01: 10 mg via ORAL
  Filled 2013-05-31: qty 1

## 2013-05-31 MED ORDER — LACTATED RINGERS IV SOLN
INTRAVENOUS | Status: DC
  Administered 2013-05-31: 1000 mL via INTRAVENOUS
  Administered 2013-05-31: 16:00:00 via INTRAVENOUS

## 2013-05-31 MED ORDER — ACETAMINOPHEN 650 MG RE SUPP
650.0000 mg | Freq: Four times a day (QID) | RECTAL | Status: AC
Start: 2013-05-31 — End: 2013-06-01
  Filled 2013-05-31: qty 1

## 2013-05-31 MED ORDER — HYDROMORPHONE HCL PF 1 MG/ML IJ SOLN
0.2500 mg | INTRAMUSCULAR | Status: DC | PRN
  Administered 2013-05-31: 0.25 mg via INTRAVENOUS

## 2013-05-31 MED ORDER — DIPHENHYDRAMINE HCL 12.5 MG/5ML PO ELIX: 12.5000 mg | ORAL_SOLUTION | ORAL | Status: DC | PRN

## 2013-05-31 MED ORDER — MORPHINE SULFATE 2 MG/ML IJ SOLN: 1.0000 mg | INTRAMUSCULAR | Status: DC | PRN

## 2013-05-31 MED ORDER — HYDROMORPHONE HCL PF 1 MG/ML IJ SOLN
INTRAMUSCULAR | Status: AC
  Filled 2013-05-31: qty 1

## 2013-05-31 MED ORDER — BUPIVACAINE IN DEXTROSE 0.75-8.25 % IT SOLN
INTRATHECAL | Status: DC | PRN
  Administered 2013-05-31: 2 mL via INTRATHECAL

## 2013-05-31 MED ORDER — DOCUSATE SODIUM 100 MG PO CAPS
100.0000 mg | ORAL_CAPSULE | Freq: Two times a day (BID) | ORAL | Status: DC
  Administered 2013-05-31 – 2013-06-02 (×4): 100 mg via ORAL

## 2013-05-31 MED ORDER — METOCLOPRAMIDE HCL 5 MG/ML IJ SOLN: 5.0000 mg | Freq: Three times a day (TID) | INTRAMUSCULAR | Status: DC | PRN

## 2013-05-31 MED ORDER — RIVAROXABAN 10 MG PO TABS
10.0000 mg | ORAL_TABLET | Freq: Every day | ORAL | Status: DC
  Administered 2013-06-01 – 2013-06-02 (×2): 10 mg via ORAL
  Filled 2013-05-31 (×3): qty 1

## 2013-05-31 MED ORDER — METHOCARBAMOL 500 MG PO TABS
500.0000 mg | ORAL_TABLET | Freq: Four times a day (QID) | ORAL | Status: DC | PRN
  Administered 2013-06-01: 500 mg via ORAL
  Filled 2013-05-31: qty 1

## 2013-05-31 SURGICAL SUPPLY — 41 items
BAG ZIPLOCK 12X15 (MISCELLANEOUS) ×4 IMPLANT
BLADE SAW SGTL 18X1.27X75 (BLADE) ×2 IMPLANT
CAPT HIP PF COP ×2 IMPLANT
CLOTH BEACON ORANGE TIMEOUT ST (SAFETY) ×2 IMPLANT
CLSR STERI-STRIP ANTIMIC 1/2X4 (GAUZE/BANDAGES/DRESSINGS) ×4 IMPLANT
DECANTER SPIKE VIAL GLASS SM (MISCELLANEOUS) ×2 IMPLANT
DRAPE C-ARM 42X120 X-RAY (DRAPES) ×2 IMPLANT
DRAPE STERI IOBAN 125X83 (DRAPES) ×2 IMPLANT
DRAPE U-SHAPE 47X51 STRL (DRAPES) ×6 IMPLANT
DRSG ADAPTIC 3X8 NADH LF (GAUZE/BANDAGES/DRESSINGS) ×2 IMPLANT
DRSG MEPILEX BORDER 4X4 (GAUZE/BANDAGES/DRESSINGS) ×2 IMPLANT
DRSG MEPILEX BORDER 4X8 (GAUZE/BANDAGES/DRESSINGS) ×2 IMPLANT
DURAPREP 26ML APPLICATOR (WOUND CARE) ×2 IMPLANT
ELECT BLADE 6.5 EXT (BLADE) ×2 IMPLANT
ELECT REM PT RETURN 9FT ADLT (ELECTROSURGICAL) ×2
ELECTRODE REM PT RTRN 9FT ADLT (ELECTROSURGICAL) ×1 IMPLANT
EVACUATOR 1/8 PVC DRAIN (DRAIN) IMPLANT
FACESHIELD LNG OPTICON STERILE (SAFETY) ×8 IMPLANT
GLOVE BIO SURGEON STRL SZ7.5 (GLOVE) ×2 IMPLANT
GLOVE BIO SURGEON STRL SZ8 (GLOVE) ×4 IMPLANT
GLOVE BIOGEL PI IND STRL 8 (GLOVE) ×2 IMPLANT
GLOVE BIOGEL PI INDICATOR 8 (GLOVE) ×2
GOWN STRL NON-REIN LRG LVL3 (GOWN DISPOSABLE) ×2 IMPLANT
GOWN STRL REIN XL XLG (GOWN DISPOSABLE) ×2 IMPLANT
KIT BASIN OR (CUSTOM PROCEDURE TRAY) ×2 IMPLANT
NDL SAFETY ECLIPSE 18X1.5 (NEEDLE) ×1 IMPLANT
NEEDLE HYPO 18GX1.5 SHARP (NEEDLE) ×1
PACK TOTAL JOINT (CUSTOM PROCEDURE TRAY) ×2 IMPLANT
PADDING CAST COTTON 6X4 STRL (CAST SUPPLIES) ×2 IMPLANT
STRIP CLOSURE SKIN 1/2X4 (GAUZE/BANDAGES/DRESSINGS) ×2 IMPLANT
SUCTION FRAZIER 12FR DISP (SUCTIONS) ×2 IMPLANT
SUT ETHIBOND NAB CT1 #1 30IN (SUTURE) ×2 IMPLANT
SUT MNCRL AB 4-0 PS2 18 (SUTURE) ×2 IMPLANT
SUT VIC AB 1 CT1 27 (SUTURE) ×1
SUT VIC AB 1 CT1 27XBRD ANTBC (SUTURE) ×1 IMPLANT
SUT VIC AB 2-0 CT1 27 (SUTURE) ×2
SUT VIC AB 2-0 CT1 TAPERPNT 27 (SUTURE) ×2 IMPLANT
SUT VLOC 180 0 24IN GS25 (SUTURE) ×2 IMPLANT
SYR 50ML LL SCALE MARK (SYRINGE) ×2 IMPLANT
TOWEL OR 17X26 10 PK STRL BLUE (TOWEL DISPOSABLE) ×4 IMPLANT
TRAY FOLEY CATH 14FRSI W/METER (CATHETERS) ×2 IMPLANT

## 2013-05-31 NOTE — Op Note (Signed)
OPERATIVE REPORT  PREOPERATIVE DIAGNOSIS: Osteoarthritis of the Right hip.   POSTOPERATIVE DIAGNOSIS: Osteoarthritis of the Right  hip.   PROCEDURE: Right total hip arthroplasty, anterior approach.   SURGEON: Ollen Gross, MD   ASSISTANT: Avel Peace, PA-C  ANESTHESIA:  Spinal  ESTIMATED BLOOD LOSS:-150 ml  DRAINS: Hemovac x1.   COMPLICATIONS: None   CONDITION: PACU - hemodynamically stable.   BRIEF CLINICAL NOTE: Tonya Hamilton is a 67 y.o. female who has advanced end-  stage arthritis of his Right  hip with progressively worsening pain and  dysfunction.The patient has failed nonoperative management and presents for  total hip arthroplasty.   PROCEDURE IN DETAIL: After successful administration of spinal  anesthetic, the traction boots for the [facility] bed were placed on both  feet and the patient was placed onto the [facility] bed, boots placed into the leg  holders. The Right hip was then isolated from the perineum with plastic  drapes and prepped and draped in the usual sterile fashion. ASIS and  greater trochanter were marked and a oblique incision was made, starting  at about 1 cm lateral and 2 cm distal to the ASIS and coursing towards  the anterior cortex of the femur. The skin was cut with a 10 blade  through subcutaneous tissue to the level of the fascia overlying the  tensor fascia lata muscle. The fascia was then incised in line with the  incision at the junction of the anterior third and posterior 2/3rd. The  muscle was teased off the fascia and then the interval between the TFL  and the rectus was developed. The Hohmann retractor was then placed at  the top of the femoral neck over the capsule. The vessels overlying the  capsule were cauterized and the fat on top of the capsule was removed.  A Hohmann retractor was then placed anterior underneath the rectus  femoris to give exposure to the entire anterior capsule. A T-shaped  capsulotomy was performed.  The edges were tagged and the femoral head  was identified.       Osteophytes are removed off the superior acetabulum.  The femoral neck was then cut in situ with an oscillating saw. Traction  was then applied to the left lower extremity utilizing the [facility]  traction. The femoral head was then removed. Retractors were placed  around the acetabulum and then circumferential removal of the labrum was  performed. Osteophytes were also removed. Reaming starts at 45 mm to  medialize and  Increased in 2 mm increments to 49 mm. We reamed in  approximately 40 degrees of abduction, 20 degrees anteversion. A 50 mm  pinnacle acetabular shell was then impacted in anatomic position under  fluoroscopic guidance with excellent purchase. We did not need to place  any additional dome screws. A 32 mm neutral + 4 marathon liner was then  placed into the acetabular shell.       The femoral lift was then placed along the lateral aspect of the femur  just distal to the vastus ridge. The leg was  externally rotated and capsule  was stripped off the inferior aspect of the femoral neck down to the  level of the lesser trochanter, this was done with electrocautery. The femur was lifted after this was performed. The  leg was then placed and extended in adducted position to essentially delivering the femur. We also removed the capsule superiorly and the  piriformis from the piriformis fossa to  gain excellent exposure of the  proximal femur. Rongeur was used to remove some cancellous bone to get  into the lateral portion of the proximal femur for placement of the  initial starter reamer. The starter broaches was placed  the starter broach  and was shown to go down the center of the canal. Broaching  with the  Corail system was then performed starting at size 8, coursing  Up to size 10. A size 10 had excellent torsional and rotational  and axial stability. The trial standard offset neck was then placed  with a 32 + 1  trial head. The hip was then reduced. We confirmed that  the stem was in the canal both on AP and lateral x-rays. It also has excellent sizing. The hip was reduced with outstanding stability through full extension, full external rotation,  and then flexion in adduction internal rotation. AP pelvis was taken  and the leg lengths were measured and found to be exactly equal. Hip  was then dislocated again and the femoral head and neck removed. The  femoral broach was removed. Size 10 Corail stem with a standard offset  neck was then impacted into the femur following native anteversion. Has  excellent purchase in the canal. Excellent torsional and rotational and  axial stability. It is confirmed to be in the canal on AP and lateral  fluoroscopic views. The 32 + 1  ceramic head was placed and the hip  reduced with outstanding stability. Again AP pelvis was taken and it  confirmed that the leg lengths were equal. The wound was then copiously  irrigated with saline solution and the capsule reattached and repaired  with Ethibond suture.  20 mL of Exparel mixed with 50 mL of saline then additional 20 ml of .25% Bupivicaine injected into the capsule and into the edge of the tensor fascia lata as well as subcutaneous tissue. The fascia overlying the tensor fascia lata was  then closed with a running #1 V-Loc. Subcu was closed with interrupted  2-0 Vicryl and subcuticular running 4-0 Monocryl. Incision was cleaned  and dried. Steri-Strips and a bulky sterile dressing applied. Hemovac  drain was hooked to suction and then he was awakened and transported to  recovery in stable condition.        Please note that a surgical assistant was a medical necessity for this procedure to perform it in a safe and expeditious manner. Assistant was necessary to provide appropriate retraction of vital neurovascular structures and to prevent femoral fracture and allow for anatomic placement of the prosthesis.  Ollen Gross,  M.D.

## 2013-05-31 NOTE — Anesthesia Postprocedure Evaluation (Signed)
  Anesthesia Post-op Note  Patient: Tonya Hamilton  Procedure(s) Performed: Procedure(s) (LRB): RIGHT TOTAL HIP ARTHROPLASTY ANTERIOR APPROACH (Right)  Patient Location: PACU  Anesthesia Type: Spinal  Level of Consciousness: awake and alert   Airway and Oxygen Therapy: Patient Spontanous Breathing  Post-op Pain: mild  Post-op Assessment: Post-op Vital signs reviewed, Patient's Cardiovascular Status Stable, Respiratory Function Stable, Patent Airway and No signs of Nausea or vomiting  Last Vitals:  Filed Vitals:   05/31/13 1700  BP: 120/60  Pulse: 82  Temp:   Resp: 12    Post-op Vital Signs: stable   Complications: No apparent anesthesia complications. Moving both feet. No complaints.

## 2013-05-31 NOTE — Transfer of Care (Signed)
Immediate Anesthesia Transfer of Care Note  Patient: Tonya Hamilton  Procedure(s) Performed: Procedure(s) (LRB): RIGHT TOTAL HIP ARTHROPLASTY ANTERIOR APPROACH (Right)  Patient Location: PACU  Anesthesia Type: Spinal  Level of Consciousness: sedated, patient cooperative and responds to stimulaton  Airway & Oxygen Therapy: Patient Spontanous Breathing and Patient connected to face mask oxgen  Post-op Assessment: Report given to PACU RN and Post -op Vital signs reviewed and stable  Post vital signs: Reviewed and stable  Complications: No apparent anesthesia complications

## 2013-05-31 NOTE — Preoperative (Signed)
Beta Blockers   Reason not to administer Beta Blockers:Not Applicable 

## 2013-05-31 NOTE — Interval H&P Note (Signed)
History and Physical Interval Note:  05/31/2013 1:52 PM  Tonya Hamilton  has presented today for surgery, with the diagnosis of OA OF RIGHT HIP  The various methods of treatment have been discussed with the patient and family. After consideration of risks, benefits and other options for treatment, the patient has consented to  Procedure(s): RIGHT TOTAL HIP ARTHROPLASTY ANTERIOR APPROACH (Right) as a surgical intervention .  The patient's history has been reviewed, patient examined, no change in status, stable for surgery.  I have reviewed the patient's chart and labs.  Questions were answered to the patient's satisfaction.     Loanne Drilling

## 2013-05-31 NOTE — Anesthesia Preprocedure Evaluation (Addendum)
Anesthesia Evaluation  Patient identified by MRN, date of birth, ID band Patient awake  General Assessment Comment:  Arthritis     .  Diverticulitis     .  Hypertension     .  Hyperlipidemia     .  Thyroid disease     .  UTI (urinary tract infection)     .  Colon polyps     .  PONV (postoperative nausea and vomiting)     .  Hypothyroidism     Reviewed: Allergy & Precautions, H&P , NPO status , Patient's Chart, lab work & pertinent test results  History of Anesthesia Complications (+) PONV  Airway Mallampati: II TM Distance: >3 FB Neck ROM: Full    Dental no notable dental hx.    Pulmonary neg pulmonary ROS,  breath sounds clear to auscultation  Pulmonary exam normal       Cardiovascular Exercise Tolerance: Good hypertension, Pt. on medications negative cardio ROS  Rhythm:Regular Rate:Normal     Neuro/Psych PSYCHIATRIC DISORDERS Anxiety negative neurological ROS     GI/Hepatic Neg liver ROS, GERD-  Medicated,  Endo/Other  Hypothyroidism   Renal/GU negative Renal ROS  negative genitourinary   Musculoskeletal negative musculoskeletal ROS (+)   Abdominal   Peds negative pediatric ROS (+)  Hematology negative hematology ROS (+)   Anesthesia Other Findings   Reproductive/Obstetrics negative OB ROS                          Anesthesia Physical Anesthesia Plan  ASA: II  Anesthesia Plan: Spinal   Post-op Pain Management:    Induction: Intravenous  Airway Management Planned:   Additional Equipment:   Intra-op Plan:   Post-operative Plan: Extubation in OR  Informed Consent: I have reviewed the patients History and Physical, chart, labs and discussed the procedure including the risks, benefits and alternatives for the proposed anesthesia with the patient or authorized representative who has indicated his/her understanding and acceptance.   Dental advisory given  Plan Discussed  with: CRNA  Anesthesia Plan Comments: (Discussed general versus spinal. H/O severe nausea with general anesthesia. Plan spinal.Discussed risks/benefits of spinal including headache, backache, failure, bleeding, infection, and nerve damage. Patient consents to spinal. Questions answered. Coagulation studies and platelet count acceptable.)       Anesthesia Quick Evaluation

## 2013-05-31 NOTE — Anesthesia Procedure Notes (Addendum)
Spinal  Patient location during procedure: OR Start time: 05/31/2013 2:20 PM End time: 05/31/2013 2:23 PM Staffing CRNA/Resident: Early Osmond E Performed by: resident/CRNA  Preanesthetic Checklist Completed: patient identified, site marked, surgical consent, pre-op evaluation, timeout performed, IV checked, risks and benefits discussed and monitors and equipment checked Spinal Block Patient position: sitting Prep: Betadine Patient monitoring: heart rate, continuous pulse ox and blood pressure Approach: midline Location: L3-4 Injection technique: single-shot Needle Needle type: Spinocan  Needle gauge: 22 G Needle length: 9 cm Assessment Sensory level: T6 Additional Notes Clear CSF pre/post injection. Pt. Tolerated well.

## 2013-06-01 ENCOUNTER — Encounter (HOSPITAL_COMMUNITY): Payer: Self-pay | Admitting: Orthopedic Surgery

## 2013-06-01 LAB — CBC
Hemoglobin: 11.3 g/dL — ABNORMAL LOW (ref 12.0–15.0)
MCH: 28.8 pg (ref 26.0–34.0)
MCHC: 32.8 g/dL (ref 30.0–36.0)
RDW: 14.7 % (ref 11.5–15.5)

## 2013-06-01 LAB — BASIC METABOLIC PANEL
BUN: 8 mg/dL (ref 6–23)
Calcium: 8.9 mg/dL (ref 8.4–10.5)
GFR calc Af Amer: >90 mL/min (ref >90)
GFR calc non Af Amer: >90 mL/min (ref >90)
Glucose, Bld: 143 mg/dL — ABNORMAL HIGH (ref 70–99)
Potassium: 4.1 mEq/L (ref 3.5–5.1)
Sodium: 138 mEq/L (ref 135–145)

## 2013-06-01 MED ORDER — OMEPRAZOLE 20 MG PO CPDR
20.0000 mg | DELAYED_RELEASE_CAPSULE | Freq: Every day | ORAL | Status: DC
  Administered 2013-06-01 – 2013-06-02 (×2): 20 mg via ORAL
  Filled 2013-06-01 (×2): qty 1

## 2013-06-01 MED ORDER — NON FORMULARY: 20.0000 mg | Freq: Every day | Status: DC

## 2013-06-01 NOTE — Evaluation (Signed)
Physical Therapy Evaluation Patient Details Name: Tonya Hamilton MRN: [id number] DOB: 05/17/1946 Today's Date: 06/01/2013 Time: 7846-9629 PT Time Calculation (min): 30 min  PT Assessment / Plan / Recommendation History of Present Illness     Clinical Impression  Pt s/p R THR presents with functional mobility limitations 2* decreased R LE strength/ROM and post op discomfort.     PT Assessment  Patient needs continued PT services    Follow Up Recommendations  Home health PT    Does the patient have the potential to tolerate intense rehabilitation      Barriers to Discharge        Equipment Recommendations  None recommended by PT    Recommendations for Other Services OT consult   Frequency 7X/week    Precautions / Restrictions Precautions Precautions: Fall Restrictions Weight Bearing Restrictions: No   Pertinent Vitals/Pain 2/10; premed, ice pack provided      Mobility  Bed Mobility Bed Mobility: Supine to Sit Supine to Sit: 5: Supervision Transfers Transfers: Sit to Stand;Stand to Sit Sit to Stand: 4: Min guard Stand to Sit: 4: Min guard Details for Transfer Assistance: cues for saftey and use of UEs to self assist Ambulation/Gait Ambulation/Gait Assistance: 4: Min guard Ambulation Distance (Feet): 400 Feet Assistive device: Rolling [place] Ambulation/Gait Assistance Details: cues for posture and position from RW Gait Pattern: Step-to pattern;Step-through pattern Stairs: No    Exercises Total Joint Exercises Ankle Circles/Pumps: AROM;10 reps;Supine;Both Quad Sets: AROM;Both;10 reps;Supine Heel Slides: AAROM;15 reps;Supine;Right Hip ABduction/ADduction: Right;AAROM;15 reps;Supine   PT Diagnosis: Difficulty walking  PT Problem List: Decreased strength;Decreased range of motion;Decreased activity tolerance;Decreased balance;Decreased mobility;Decreased knowledge of use of DME;Pain PT Treatment Interventions: DME instruction;Gait training;Stair  training;Functional mobility training;Therapeutic activities;Therapeutic exercise;Patient/family education     PT Goals(Current goals can be found in the care plan section) Acute Rehab PT Goals Patient Stated Goal: Get back to shopping PT Goal Formulation: With patient Time For Goal Achievement: 06/07/13 Potential to Achieve Goals: Good  Visit Information  Last PT Received On: 06/01/13 Assistance Needed: +1       Prior Functioning  Home Living Family/patient expects to be discharged to:: Private residence Living Arrangements: Spouse/significant other Available Help at Discharge: Family Type of Home: House Home Access: Stairs to enter Secretary/administrator of Steps: 5 Entrance Stairs-Rails: Right Home Layout: One level Home Equipment: Environmental consultant - 2 wheels Prior Function Level of Independence: Independent Communication Communication: No difficulties Dominant Hand: Right    Cognition  Cognition Arousal/Alertness: Awake/alert Behavior During Therapy: WFL for tasks assessed/performed Overall Cognitive Status: Within Functional Limits for tasks assessed    Extremity/Trunk Assessment Upper Extremity Assessment Upper Extremity Assessment: Overall WFL for tasks assessed Lower Extremity Assessment Lower Extremity Assessment: RLE deficits/detail RLE Deficits / Details: Hip strength 3-/5 with AAROM at hip to 95 flex and 25 abd   Balance    End of Session PT - End of Session Equipment Utilized During Treatment: Gait belt Activity Tolerance: Patient tolerated treatment well Patient left: in chair;with call bell/phone within reach Nurse Communication: Mobility status  GP     Denica Web 06/01/2013, 12:18 PM

## 2013-06-01 NOTE — Progress Notes (Signed)
OT Cancellation Note  Patient Details Name: Tonya Hamilton MRN: [id number] DOB: Aug 11, 1946   Cancelled Treatment:    Reason Eval/Treat Not Completed: OT screened, no needs identified, will sign off  Larri Brewton 06/01/2013, 12:28 PM Marica Otter, OTR/L [phone] 06/01/2013

## 2013-06-01 NOTE — Progress Notes (Signed)
   Subjective: 1 Day Post-Op Procedure(s) (LRB): RIGHT TOTAL HIP ARTHROPLASTY ANTERIOR APPROACH (Right) Patient reports pain as mild.   Patient seen in rounds with Dr. Lequita Halt. Patient is well, and has had no acute complaints or problems We will start therapy today.  Plan is to go Home after hospital stay.  Objective: Vital signs in last 24 hours: Temp:  [97.8 F (36.6 C)-98.4 F (36.9 C)] 98.2 F (36.8 C) (07/31 0524) Pulse Rate:  [66-90] 66 (07/31 0524) Resp:  [12-20] 14 (07/31 0524) BP: (94-147)/(42-77) 108/65 mmHg (07/31 0524) SpO2:  [96 %-100 %] 96 % (07/31 0524) Weight:  [63.504 kg (140 lb)] 63.504 kg (140 lb) (07/30 2300)  Intake/Output from previous day:  Intake/Output Summary (Last 24 hours) at 06/01/13 1015 Last data filed at 06/01/13 0900  Gross per 24 hour  Intake 2128.58 ml  Output   2084 ml  Net  44.58 ml    Intake/Output this shift: Total I/O In: 240 [P.O.:240] Out: -   Labs:  Recent Labs  06/01/13 0420  HGB 11.3*    Recent Labs  06/01/13 0420  WBC 12.9*  RBC 3.93  HCT 34.5*  PLT 344    Recent Labs  06/01/13 0420  NA 138  K 4.1  CL 102  CO2 26  BUN 8  CREATININE 0.62  GLUCOSE 143*  CALCIUM 8.9   No results found for this basename: LABPT, INR,  in the last 72 hours  EXAM General - Patient is Alert, Appropriate and Oriented Extremity - Neurovascular intact Sensation intact distally Dorsiflexion/Plantar flexion intact Dressing - dressing C/D/I Motor Function - intact, moving foot and toes well on exam.  Hemovac pulled without difficulty.  Past Medical History  Diagnosis Date  . Arthritis   . Diverticulitis   . Hypertension   . Hyperlipidemia   . Thyroid disease   . UTI (urinary tract infection)   . Colon polyps   . PONV (postoperative nausea and vomiting)   . Hypothyroidism     Assessment/Plan: 1 Day Post-Op Procedure(s) (LRB): RIGHT TOTAL HIP ARTHROPLASTY ANTERIOR APPROACH (Right) Active Problems:   Hypertension  - lisinopril on hold, resume if pressure goes up   Hypothyroid - resumed levothyroxine   GERD (gastroesophageal reflux disease) - resumed omeprazole  Estimated body mass index is 24.02 kg/(m^2) as calculated from the following:   Height as of this encounter: 5\' 4"  (1.626 m).   Weight as of this encounter: 63.504 kg (140 lb). Up with therapy Plan for discharge tomorrow Discharge home with home health  DVT Prophylaxis - Xarelto Weight Bearing As Tolerated right Leg Hemovac Pulled Begin Therapy No vaccines.  Tonya Hamilton 06/01/2013, 10:15 AM

## 2013-06-01 NOTE — Progress Notes (Signed)
Physical Therapy Treatment Patient Details Name: Tonya Hamilton MRN: [id number] DOB: 1946-01-07 Today's Date: 06/01/2013 Time: 4098-1191 PT Time Calculation (min): 15 min  PT Assessment / Plan / Recommendation  History of Present Illness     PT Comments   Pt doing exceptionally well and at Sup level for all mobility.    Follow Up Recommendations  Home health PT     Does the patient have the potential to tolerate intense rehabilitation     Barriers to Discharge        Equipment Recommendations  None recommended by PT    Recommendations for Other Services OT consult  Frequency 7X/week   Progress towards PT Goals Progress towards PT goals: Progressing toward goals  Plan Current plan remains appropriate    Precautions / Restrictions Precautions Precautions: Fall Restrictions Weight Bearing Restrictions: No   Pertinent Vitals/Pain 2/10    Mobility  Bed Mobility Bed Mobility: Supine to Sit;Sit to Supine Supine to Sit: 5: Supervision Sit to Supine: 5: Supervision Transfers Transfers: Sit to Stand;Stand to Sit Sit to Stand: 5: Supervision Stand to Sit: 5: Supervision Details for Transfer Assistance: cues for saftey and use of UEs to self assist Ambulation/Gait Ambulation/Gait Assistance: 4: Min guard;5: Supervision Ambulation Distance (Feet): 400 Feet Assistive device: Rolling [place] Ambulation/Gait Assistance Details: cues for position from RW and pace Gait Pattern: Within Functional Limits Stairs: Yes Stairs Assistance: 4: Min guard;5: Supervision Stairs Assistance Details (indicate cue type and reason): cues for sequence Stair Management Technique: One rail Right;Forwards;Step to pattern Number of Stairs: 8    Exercises     PT Diagnosis:    PT Problem List:   PT Treatment Interventions:     PT Goals (current goals can now be found in the care plan section) Acute Rehab PT Goals Patient Stated Goal: Get back to shopping PT Goal Formulation: With  patient Time For Goal Achievement: 06/07/13 Potential to Achieve Goals: Good  Visit Information  Last PT Received On: 06/01/13 Assistance Needed: +1    Subjective Data  Subjective: I am ready, can I go without the [place] Patient Stated Goal: Get back to shopping   Cognition  Cognition Arousal/Alertness: Awake/alert Behavior During Therapy: WFL for tasks assessed/performed Overall Cognitive Status: Within Functional Limits for tasks assessed    Balance     End of Session PT - End of Session Equipment Utilized During Treatment: Gait belt Activity Tolerance: Patient tolerated treatment well Patient left: in bed;with call bell/phone within reach;with family/visitor present Nurse Communication: Mobility status   GP     Tonya Hamilton 06/01/2013, 4:08 PM

## 2013-06-01 NOTE — Progress Notes (Signed)
Utilization review completed.  

## 2013-06-02 LAB — CBC
HCT: 33.2 % — ABNORMAL LOW (ref 36.0–46.0)
MCH: 29.4 pg (ref 26.0–34.0)
MCHC: 33.1 g/dL (ref 30.0–36.0)
MCV: 88.8 fL (ref 78.0–100.0)
Platelets: 357 10*3/uL (ref 150–400)
RDW: 15.2 % (ref 11.5–15.5)
WBC: 17.7 10*3/uL — ABNORMAL HIGH (ref 4.0–10.5)

## 2013-06-02 LAB — BASIC METABOLIC PANEL
BUN: 13 mg/dL (ref 6–23)
Calcium: 8.9 mg/dL (ref 8.4–10.5)
Chloride: 103 mEq/L (ref 96–112)
Creatinine, Ser: 0.84 mg/dL (ref 0.50–1.10)
GFR calc Af Amer: 82 mL/min — ABNORMAL LOW (ref >90)

## 2013-06-02 MED ORDER — RIVAROXABAN 10 MG PO TABS: 10.0000 mg | ORAL_TABLET | Freq: Every day | ORAL | Status: DC

## 2013-06-02 MED ORDER — METHOCARBAMOL 500 MG PO TABS: 500.0000 mg | ORAL_TABLET | Freq: Four times a day (QID) | ORAL | Status: DC | PRN

## 2013-06-02 MED ORDER — OXYCODONE HCL 5 MG PO TABS: 5.0000 mg | ORAL_TABLET | ORAL | Status: DC | PRN

## 2013-06-02 MED ORDER — TRAMADOL HCL 50 MG PO TABS: 50.0000 mg | ORAL_TABLET | Freq: Four times a day (QID) | ORAL | Status: DC | PRN

## 2013-06-02 NOTE — Discharge Summary (Signed)
Physician Discharge Summary   Patient ID: Tonya Hamilton MRN: [id number] DOB/AGE: May 22, 1946 67 y.o.  Admit date: 05/31/2013 Discharge date: 06/02/2013  Primary Diagnosis:  Osteoarthritis of the Right hip.   Admission Diagnoses:  Past Medical History  Diagnosis Date  . Arthritis   . Diverticulitis   . Hypertension   . Hyperlipidemia   . Thyroid disease   . UTI (urinary tract infection)   . Colon polyps   . PONV (postoperative nausea and vomiting)   . Hypothyroidism    Discharge Diagnoses:   Active Problems:   Hypertension   Hypothyroid   GERD (gastroesophageal reflux disease)  Estimated body mass index is 24.02 kg/(m^2) as calculated from the following:   Height as of this encounter: 5\' 4"  (1.626 m).   Weight as of this encounter: 63.504 kg (140 lb).  Procedure(s) (LRB): RIGHT TOTAL HIP ARTHROPLASTY ANTERIOR APPROACH (Right)   Consults: None  HPI: Tonya Hamilton is a 67 y.o. female who has advanced end-  stage arthritis of his Right hip with progressively worsening pain and  dysfunction.The patient has failed nonoperative management and presents for  total hip arthroplasty.  Laboratory Data: Admission on 05/31/2013, Discharged on 06/02/2013  Component Date Value Range Status  . WBC 06/01/2013 12.9* 4.0 - 10.5 K/uL Final  . RBC 06/01/2013 3.93  3.87 - 5.11 MIL/uL Final  . Hemoglobin 06/01/2013 11.3* 12.0 - 15.0 g/dL Final  . HCT 40/98/1191 34.5* 36.0 - 46.0 % Final  . MCV 06/01/2013 87.8  78.0 - 100.0 fL Final  . MCH 06/01/2013 28.8  26.0 - 34.0 pg Final  . MCHC 06/01/2013 32.8  30.0 - 36.0 g/dL Final  . RDW 47/82/9562 14.7  11.5 - 15.5 % Final  . Platelets 06/01/2013 344  150 - 400 K/uL Final  . Sodium 06/01/2013 138  135 - 145 mEq/L Final  . Potassium 06/01/2013 4.1  3.5 - 5.1 mEq/L Final  . Chloride 06/01/2013 102  96 - 112 mEq/L Final  . CO2 06/01/2013 26  19 - 32 mEq/L Final  . Glucose, Bld 06/01/2013 143* 70 - 99 mg/dL Final  . BUN 13/06/6577 8  6 - 23  mg/dL Final  . Creatinine, Ser 06/01/2013 0.62  0.50 - 1.10 mg/dL Final  . Calcium 46/96/2952 8.9  8.4 - 10.5 mg/dL Final  . GFR calc non Af Amer 06/01/2013 >90  >90 mL/min Final  . GFR calc Af Amer 06/01/2013 >90  >90 mL/min Final   Comment:                                 The eGFR has been calculated                          using the CKD EPI equation.                          This calculation has not been                          validated in all clinical                          situations.  eGFR's persistently                          <90 mL/min signify                          possible Chronic Kidney Disease.  . WBC 06/02/2013 17.7* 4.0 - 10.5 K/uL Final  . RBC 06/02/2013 3.74* 3.87 - 5.11 MIL/uL Final  . Hemoglobin 06/02/2013 11.0* 12.0 - 15.0 g/dL Final  . HCT 16/08/9603 33.2* 36.0 - 46.0 % Final  . MCV 06/02/2013 88.8  78.0 - 100.0 fL Final  . MCH 06/02/2013 29.4  26.0 - 34.0 pg Final  . MCHC 06/02/2013 33.1  30.0 - 36.0 g/dL Final  . RDW 54/07/8118 15.2  11.5 - 15.5 % Final  . Platelets 06/02/2013 357  150 - 400 K/uL Final  . Sodium 06/02/2013 141  135 - 145 mEq/L Final  . Potassium 06/02/2013 3.8  3.5 - 5.1 mEq/L Final  . Chloride 06/02/2013 103  96 - 112 mEq/L Final  . CO2 06/02/2013 29  19 - 32 mEq/L Final  . Glucose, Bld 06/02/2013 169* 70 - 99 mg/dL Final  . BUN 14/78/2956 13  6 - 23 mg/dL Final  . Creatinine, Ser 06/02/2013 0.84  0.50 - 1.10 mg/dL Final  . Calcium 21/30/8657 8.9  8.4 - 10.5 mg/dL Final  . GFR calc non Af Amer 06/02/2013 70* >90 mL/min Final  . GFR calc Af Amer 06/02/2013 82* >90 mL/min Final   Comment:                                 The eGFR has been calculated                          using the CKD EPI equation.                          This calculation has not been                          validated in all clinical                          situations.                          eGFR's persistently                           <90 mL/min signify                          possible Chronic Kidney Disease.  Hospital Outpatient Visit on 05/25/2013  Component Date Value Range Status  . aPTT 05/25/2013 27  24 - 37 seconds Final  . WBC 05/25/2013 6.0  4.0 - 10.5 K/uL Final  . RBC 05/25/2013 4.53  3.87 - 5.11 MIL/uL Final  . Hemoglobin 05/25/2013 13.2  12.0 - 15.0 g/dL Final  . HCT 84/69/6295 40.2  36.0 - 46.0 % Final  . MCV 05/25/2013 88.7  78.0 - 100.0 fL Final  . MCH 05/25/2013 29.1  26.0 - 34.0 pg Final  . MCHC 05/25/2013 32.8  30.0 - 36.0 g/dL Final  . RDW 40/34/7425 15.2  11.5 - 15.5 % Final  . Platelets 05/25/2013 392  150 - 400 K/uL Final  . Sodium 05/25/2013 142  135 - 145 mEq/L Final  . Potassium 05/25/2013 3.7  3.5 - 5.1 mEq/L Final  . Chloride 05/25/2013 103  96 - 112 mEq/L Final  . CO2 05/25/2013 31  19 - 32 mEq/L Final  . Glucose, Bld 05/25/2013 103* 70 - 99 mg/dL Final  . BUN 95/63/8756 12  6 - 23 mg/dL Final  . Creatinine, Ser 05/25/2013 0.71  0.50 - 1.10 mg/dL Final  . Calcium 43/32/9518 9.3  8.4 - 10.5 mg/dL Final  . Total Protein 05/25/2013 7.0  6.0 - 8.3 g/dL Final  . Albumin 84/16/6063 3.9  3.5 - 5.2 g/dL Final  . AST 01/60/1093 16  0 - 37 U/L Final  . ALT 05/25/2013 19  0 - 35 U/L Final  . Alkaline Phosphatase 05/25/2013 69  39 - 117 U/L Final  . Total Bilirubin 05/25/2013 0.4  0.3 - 1.2 mg/dL Final  . GFR calc non Af Amer 05/25/2013 87* >90 mL/min Final  . GFR calc Af Amer 05/25/2013 >90  >90 mL/min Final   Comment:                                 The eGFR has been calculated                          using the CKD EPI equation.                          This calculation has not been                          validated in all clinical                          situations.                          eGFR's persistently                          <90 mL/min signify                          possible Chronic Kidney Disease.  Marland Kitchen Prothrombin Time 05/25/2013 11.8  11.6 - 15.2 seconds Final  . INR  05/25/2013 0.88  0.00 - 1.49 Final  . ABO/RH(D) 05/25/2013 O NEG   Final  . Antibody Screen 05/25/2013 NEG   Final  . Sample Expiration 05/25/2013 06/03/2013   Final  . Color, Urine 05/25/2013 YELLOW  YELLOW Final  . APPearance 05/25/2013 CLEAR  CLEAR Final  . Specific Gravity, Urine 05/25/2013 1.019  1.005 - 1.030 Final  . pH 05/25/2013 5.5  5.0 - 8.0 Final  . Glucose, UA 05/25/2013 NEGATIVE  NEGATIVE mg/dL Final  . Hgb urine dipstick 05/25/2013 NEGATIVE  NEGATIVE Final  . Bilirubin Urine 05/25/2013 NEGATIVE  NEGATIVE Final  . Ketones, ur 05/25/2013 NEGATIVE  NEGATIVE mg/dL Final  . Protein, ur 23/55/7322 NEGATIVE  NEGATIVE mg/dL Final  . Urobilinogen, UA 05/25/2013 0.2  0.0 - 1.0 mg/dL Final  .  Nitrite 05/25/2013 NEGATIVE  NEGATIVE Final  . Leukocytes, UA 05/25/2013 MODERATE* NEGATIVE Final  . MRSA, PCR 05/25/2013 NEGATIVE  NEGATIVE Final  . Staphylococcus aureus 05/25/2013 POSITIVE* NEGATIVE Final   Comment:                                 The Xpert SA Assay (FDA                          approved for NASAL specimens                          in patients over 62 years of age),                          is one component of                          a comprehensive surveillance                          program.  Test performance has                          been validated by [employer] for patients greater                          than or equal to 42 year old.                          It is not intended                          to diagnose infection nor to                          guide or monitor treatment.  . ABO/RH(D) 05/25/2013 O NEG   Final  . Squamous Epithelial / LPF 05/25/2013 RARE  RARE Final  . WBC, UA 05/25/2013 7-10  <3 WBC/hpf Final  . RBC / HPF 05/25/2013 0-2  <3 RBC/hpf Final  . Bacteria, UA 05/25/2013 RARE  RARE Final     X-Rays:Dg Chest 2 View  05/25/2013   *RADIOLOGY REPORT*  Clinical Data: Preoperative evaluation for right-sided hip  replacement.  CHEST - 2 VIEW  Comparison: No priors.  Findings: Lung volumes are normal.  No consolidative airspace disease.  No pleural effusions.  No pneumothorax.  No pulmonary nodule or mass noted.  Pulmonary vasculature and the cardiomediastinal silhouette are within normal limits. Atherosclerosis in the thoracic aorta.  Surgical clips project over the right upper quadrant of the abdomen, likely from prior cholecystectomy.  IMPRESSION: 1. No radiographic evidence of acute cardiopulmonary disease. 2.  Atherosclerosis.   Original Report Authenticated By: Trudie Reed, M.D.   Dg Hip Complete Right  05/25/2013   *RADIOLOGY REPORT*  Clinical Data: Preoperative evaluation prior to right hip replacement.  RIGHT HIP - COMPLETE 2+ VIEW  Comparison: No priors.  Findings: AP view of the pelvis and  AP and lateral views of the right hip demonstrate no acute displaced fracture, subluxation or dislocation.  There is advanced joint space narrowing, subchondral sclerosis, subchondral cyst formation and osteophyte formation at the right hip joint (with similar but lesser changes in the left hip joint), compatible with advanced osteoarthritis.  A suture line is noted in the right hemi pelvis and surgical clips are noted along the left pelvic side wall.  IMPRESSION: 1.  Advanced degenerative changes of osteoarthritis of the right hip joint, as above.   Original Report Authenticated By: Trudie Reed, M.D.   Dg Pelvis Portable  05/31/2013   *RADIOLOGY REPORT*  Clinical Data: Postop right total hip arthroplasty.  PORTABLE PELVIS  Comparison: None.  Findings: Exam demonstrates a right total hip arthroplasty which appears in adequate position and is intact.  There is a surgical drain over the right hip.  There are surgical sutures over the pelvis with metallic clips over the left pelvis.  IMPRESSION: Postoperative changes compatible with recent right total hip arthroplasty. No complicating features.   Original Report  Authenticated By: Elberta Fortis, M.D.   Dg C-arm 1-60 Min-no Report  05/31/2013   CLINICAL DATA: right anterior hip   C-ARM 1-60 MINUTES  Fluoroscopy was utilized by the requesting physician.  No radiographic  interpretation.     EKG: Orders placed in visit on 04/28/13  . EKG 12-LEAD     Hospital Course: Patient was admitted to [facility] and taken to the OR and underwent the above state procedure without complications.  Patient tolerated the procedure well and was later transferred to the recovery room and then to the orthopaedic floor for postoperative care.  They were given PO and IV analgesics for pain control following their surgery.  They were given 24 hours of postoperative antibiotics of  Anti-infectives   Start     Dose/Rate Route Frequency Ordered Stop   05/31/13 2000  ceFAZolin (ANCEF) IVPB 1 g/50 mL premix     1 g 100 mL/hr over 30 Minutes Intravenous Every 6 hours 05/31/13 1752 06/01/13 0300   05/31/13 1145  ceFAZolin (ANCEF) IVPB 2 g/50 mL premix     2 g 100 mL/hr over 30 Minutes Intravenous On call to O.R. 05/31/13 1138 05/31/13 1420     and started on DVT prophylaxis in the form of Xarelto.   PT and OT were ordered for total hip protocol.  The patient was allowed to be WBAT with therapy. Discharge planning was consulted to help with postop disposition and equipment needs.  Patient had a decent night on the evening of surgery.  They started to get up OOB with therapy on day one.  Hemovac drain was pulled without difficulty.  The knee i wellmmobilizer was removed and discontinued.  Continued to work with therapy into day two.  Dressing was changed on day two and the incision was healing.   Patient was seen in rounds and was ready to go home later on POD 2.   Discharge Medications: Prior to Admission medications   Medication Sig Start Date End Date Taking? Authorizing Provider  levothyroxine (SYNTHROID, LEVOTHROID) 25 MCG tablet Take 25 mcg by mouth daily before  breakfast.   Yes Historical Provider, MD  lisinopril (PRINIVIL,ZESTRIL) 40 MG tablet Take 40 mg by mouth every evening.  09/02/12  Yes Historical Provider, MD  omeprazole (PRILOSEC) 20 MG capsule Take 20 mg by mouth daily.  09/14/12  Yes Historical Provider, MD  methocarbamol (ROBAXIN) 500 MG tablet Take 1 tablet (  500 mg total) by mouth every 6 (six) hours as needed. 06/02/13   Alexzandrew Perkins, PA-C  oxyCODONE (OXY IR/ROXICODONE) 5 MG immediate release tablet Take 1-2 tablets (5-10 mg total) by mouth every 3 (three) hours as needed. 06/02/13   Alexzandrew Julien Girt, PA-C  rivaroxaban (XARELTO) 10 MG TABS tablet Take 1 tablet (10 mg total) by mouth daily with breakfast. Take Xarelto for two and a half more weeks, then discontinue Xarelto. Once the patient has completed the blood thinner regimen, then take a Baby 81 mg Aspirin daily for four more weeks. 06/02/13   Alexzandrew Perkins, PA-C  traMADol (ULTRAM) 50 MG tablet Take 1-2 tablets (50-100 mg total) by mouth every 6 (six) hours as needed (mild pain). 06/02/13   Alexzandrew Julien Girt, PA-C   Discharge home with home health  Diet - Cardiac diet  Follow up - in 2 weeks  Activity - WBAT  Disposition - Home  Condition Upon Discharge - Good  D/C Meds - See DC Summary  DVT Prophylaxis - Xarelto       Discharge Orders   Future Orders Complete By Expires     Call MD / Call 911  As directed     Comments:      If you experience chest pain or shortness of breath, CALL 911 and be transported to the hospital emergency room.  If you develope a fever above 101 F, pus (white drainage) or increased drainage or redness at the wound, or calf pain, call your surgeon's office.    Change dressing  As directed     Comments:      You may change your dressing dressing daily with sterile 4 x 4 inch gauze dressing and paper tape.  Do not submerge the incision under water.    Constipation Prevention  As directed     Comments:      Drink plenty of fluids.  Prune juice  may be helpful.  You may use a stool softener, such as Colace (over the counter) 100 mg twice a day.  Use MiraLax (over the counter) for constipation as needed.    Diet - low sodium heart healthy  As directed     Discharge instructions  As directed     Comments:      Pick up stool softner and laxative for home. Do not submerge incision under water. May shower. Continue to use ice for pain and swelling from surgery.  Total Hip Protocol.  Take Xarelto for two and a half more weeks, then discontinue Xarelto. Once the patient has completed the blood thinner regimen, then take a Baby 81 mg Aspirin daily for four more weeks.    Do not sit on low chairs, stoools or toilet seats, as it may be difficult to get up from low surfaces  As directed     Driving restrictions  As directed     Comments:      No driving until released by the physician.    Increase activity slowly as tolerated  As directed     Lifting restrictions  As directed     Comments:      No lifting until released by the physician.    Patient may shower  As directed     Comments:      You may shower without a dressing once there is no drainage.  Do not wash over the wound.  If drainage remains, do not shower until drainage stops.    TED hose  As directed  Comments:      Use stockings (TED hose) for 3 weeks on both leg(s).  You may remove them at night for sleeping.    Weight bearing as tolerated  As directed         Medication List    STOP taking these medications       alendronate 70 MG tablet  Commonly known as:  FOSAMAX     mupirocin nasal ointment 2 %  Commonly known as:  BACTROBAN     PREMARIN vaginal cream  Generic drug:  conjugated estrogens      TAKE these medications       levothyroxine 25 MCG tablet  Commonly known as:  SYNTHROID, LEVOTHROID  Take 25 mcg by mouth daily before breakfast.     lisinopril 40 MG tablet  Commonly known as:  PRINIVIL,ZESTRIL  Take 40 mg by mouth every evening.      methocarbamol 500 MG tablet  Commonly known as:  ROBAXIN  Take 1 tablet (500 mg total) by mouth every 6 (six) hours as needed.     omeprazole 20 MG capsule  Commonly known as:  PRILOSEC  Take 20 mg by mouth daily.     oxyCODONE 5 MG immediate release tablet  Commonly known as:  Oxy IR/ROXICODONE  Take 1-2 tablets (5-10 mg total) by mouth every 3 (three) hours as needed.     rivaroxaban 10 MG Tabs tablet  Commonly known as:  XARELTO  - Take 1 tablet (10 mg total) by mouth daily with breakfast. Take Xarelto for two and a half more weeks, then discontinue Xarelto.  - Once the patient has completed the blood thinner regimen, then take a Baby 81 mg Aspirin daily for four more weeks.     traMADol 50 MG tablet  Commonly known as:  ULTRAM  Take 1-2 tablets (50-100 mg total) by mouth every 6 (six) hours as needed (mild pain).       Follow-up Information   Follow up with Loanne Drilling, MD. Schedule an appointment as soon as possible for a visit in 2 weeks.   Contact information:   [address] [place] [address] [phone]       Signed: Patrica Duel 06/07/2013, 6:43 PM

## 2013-06-02 NOTE — Progress Notes (Signed)
Physical Therapy Treatment Patient Details Name: Tonya Hamilton MRN: [id number] DOB: 25-Nov-1945 Today's Date: 06/02/2013 Time: 7829-5621 PT Time Calculation (min): 30 min  PT Assessment / Plan / Recommendation  History of Present Illness     PT Comments     Follow Up Recommendations  Home health PT     Does the patient have the potential to tolerate intense rehabilitation     Barriers to Discharge        Equipment Recommendations  None recommended by PT    Recommendations for Other Services OT consult  Frequency 7X/week   Progress towards PT Goals Progress towards PT goals: Progressing toward goals  Plan Current plan remains appropriate    Precautions / Restrictions Precautions Precautions: Fall Restrictions Weight Bearing Restrictions: No   Pertinent Vitals/Pain 2/10    Mobility  Bed Mobility Bed Mobility: Supine to Sit;Sit to Supine Supine to Sit: 5: Supervision Sit to Supine: 5: Supervision Transfers Transfers: Sit to Stand;Stand to Sit Sit to Stand: 5: Supervision Stand to Sit: 5: Supervision Details for Transfer Assistance: cues for saftey and use of UEs to self assist Ambulation/Gait Ambulation/Gait Assistance: 5: Supervision Ambulation Distance (Feet): 400 Feet Assistive device: [place] Ambulation/Gait Assistance Details: min cues for position from RW Gait Pattern: Within Functional Limits Stairs: Yes Stairs Assistance: 5: Supervision Stair Management Technique: One rail Right;Forwards;Step to pattern Number of Stairs: 4    Exercises Total Joint Exercises Ankle Circles/Pumps: AROM;Supine;Both;20 reps Quad Sets: AROM;Both;10 reps;Supine Gluteal Sets: AROM;10 reps;Both;Supine Heel Slides: AAROM;Supine;Right;20 reps Hip ABduction/ADduction: Right;AAROM;Supine;20 reps;10 reps;Standing Marching in Standing: AROM;10 reps;Right;Standing   PT Diagnosis:    PT Problem List:   PT Treatment Interventions:     PT Goals (current goals can now be  found in the care plan section) Acute Rehab PT Goals Patient Stated Goal: Get back to shopping PT Goal Formulation: With patient Time For Goal Achievement: 06/07/13 Potential to Achieve Goals: Good  Visit Information  Last PT Received On: 06/02/13 Assistance Needed: +1    Subjective Data  Patient Stated Goal: Get back to shopping   Cognition  Cognition Arousal/Alertness: Awake/alert Behavior During Therapy: [facility] for tasks assessed/performed Overall Cognitive Status: Within Functional Limits for tasks assessed    Balance     End of Session PT - End of Session Equipment Utilized During Treatment: Gait belt Activity Tolerance: Patient tolerated treatment well Patient left: in chair;with call bell/phone within reach Nurse Communication: Mobility status   GP     Tonya Hamilton 06/02/2013, 12:46 PM

## 2013-06-02 NOTE — Care Management Note (Signed)
    Page 1 of 1   06/02/2013     7:29:22 PM   CARE MANAGEMENT NOTE 06/02/2013  Patient:  Tonya Hamilton, Tonya Hamilton   Account Number:  [id number]  Date Initiated:  06/02/2013  Documentation initiated by:  Colleen Can  Subjective/Objective Assessment:   dx rt hip arthroplasty     Action/Plan:   CM spoke with patient. Plans are for her to return to her home where spouse will be caregiver. Already has dme.   Anticipated DC Date:  06/02/2013   Anticipated DC Plan:  HOME W HOME HEALTH SERVICES      DC Planning Services  CM consult      [facility] Choice  HOME HEALTH   Choice offered to / List presented to:  C-1 Patient        HH arranged  HH-2 PT      [facility] agency  [facility]   Status of service:  Completed, signed off Medicare Important Message given?  NA - LOS <3 / Initial given by admissions (If response is "NO", the following Medicare IM given date fields will be blank) Date Medicare IM given:   Date Additional Medicare IM given:    Discharge Disposition:  HOME W HOME HEALTH SERVICES  Per UR Regulation:    If discussed at Long Length of Stay Meetings, dates discussed:    Comments:  06/02/2013 Colleen Can BSN RN CCM [phone] Aundra Millet will provide [facility] services within 48hrs of discharge.

## 2013-06-02 NOTE — Progress Notes (Signed)
Discharge summary sent to payer through [facility]  

## 2013-06-02 NOTE — Progress Notes (Signed)
   Subjective: 2 Days Post-Op Procedure(s) (LRB): RIGHT TOTAL HIP ARTHROPLASTY ANTERIOR APPROACH (Right) Patient reports pain as mild.   Patient seen in rounds with Dr. Lequita Halt. Patient is well, and has had no acute complaints or problems Patient is ready to go home toady.  Objective: Vital signs in last 24 hours: Temp:  [97.7 F (36.5 C)-98.8 F (37.1 C)] 98.8 F (37.1 C) (08/01 0605) Pulse Rate:  [69-79] 76 (08/01 0605) Resp:  [16] 16 (08/01 0605) BP: (110-125)/(58-68) 110/58 mmHg (08/01 0605) SpO2:  [94 %-96 %] 94 % (08/01 0605)  Intake/Output from previous day:  Intake/Output Summary (Last 24 hours) at 06/02/13 0736 Last data filed at 06/01/13 1700  Gross per 24 hour  Intake    720 ml  Output   1800 ml  Net  -1080 ml    Intake/Output this shift:    Labs:  Recent Labs  06/01/13 0420 06/02/13 0420  HGB 11.3* 11.0*    Recent Labs  06/01/13 0420 06/02/13 0420  WBC 12.9* 17.7*  RBC 3.93 3.74*  HCT 34.5* 33.2*  PLT 344 357    Recent Labs  06/01/13 0420 06/02/13 0420  NA 138 141  K 4.1 3.8  CL 102 103  CO2 26 29  BUN 8 13  CREATININE 0.62 0.84  GLUCOSE 143* 169*  CALCIUM 8.9 8.9   No results found for this basename: LABPT, INR,  in the last 72 hours  EXAM: General - Patient is Alert, Appropriate and Oriented Extremity - Neurovascular intact Sensation intact distally Dorsiflexion/Plantar flexion intact No cellulitis present Incision - clean, dry, no drainage, healing Motor Function - intact, moving foot and toes well on exam.   Assessment/Plan: 2 Days Post-Op Procedure(s) (LRB): RIGHT TOTAL HIP ARTHROPLASTY ANTERIOR APPROACH (Right) Procedure(s) (LRB): RIGHT TOTAL HIP ARTHROPLASTY ANTERIOR APPROACH (Right) Past Medical History  Diagnosis Date  . Arthritis   . Diverticulitis   . Hypertension   . Hyperlipidemia   . Thyroid disease   . UTI (urinary tract infection)   . Colon polyps   . PONV (postoperative nausea and vomiting)   .  Hypothyroidism    Active Problems:   Hypertension   Hypothyroid   GERD (gastroesophageal reflux disease)  Estimated body mass index is 24.02 kg/(m^2) as calculated from the following:   Height as of this encounter: 5\' 4"  (1.626 m).   Weight as of this encounter: 63.504 kg (140 lb). Up with therapy Discharge home with home health Diet - Cardiac diet Follow up - in 2 weeks Activity - WBAT Disposition - Home Condition Upon Discharge - Good D/C Meds - See DC Summary DVT Prophylaxis - Xarelto  PERKINS, ALEXZANDREW 06/02/2013, 7:36 AM

## 2013-06-07 ENCOUNTER — Other Ambulatory Visit: Payer: Self-pay

## 2013-07-10 ENCOUNTER — Other Ambulatory Visit: Payer: Self-pay | Admitting: Family Medicine

## 2013-08-18 ENCOUNTER — Other Ambulatory Visit: Payer: Self-pay | Admitting: Family Medicine

## 2013-08-21 ENCOUNTER — Other Ambulatory Visit: Payer: Self-pay

## 2013-08-21 DIAGNOSIS — Z1231 Encounter for screening mammogram for malignant neoplasm of breast: Secondary | ICD-10-CM

## 2013-08-30 ENCOUNTER — Ambulatory Visit
Admission: RE | Admit: 2013-08-30 | Discharge: 2013-08-30 | Disposition: A | Discharge status: Home / Self Care | Payer: Medicare Other | Source: Ambulatory Visit

## 2013-08-30 DIAGNOSIS — Z1231 Encounter for screening mammogram for malignant neoplasm of breast: Secondary | ICD-10-CM

## 2013-09-04 ENCOUNTER — Telehealth: Payer: Self-pay | Admitting: Family Medicine

## 2013-09-04 NOTE — Telephone Encounter (Signed)
Lets see about getting her added on to end of morning tomorrow to be seen.

## 2013-09-04 NOTE — Telephone Encounter (Signed)
Pt has another uti and also a sinus inf? Pt refused to see another provider. Only a sd on tues. Pt would like to come in or have RX sent to [facility] [address]

## 2013-09-05 ENCOUNTER — Encounter: Payer: Self-pay | Admitting: Family Medicine

## 2013-09-05 ENCOUNTER — Ambulatory Visit (INDEPENDENT_AMBULATORY_CARE_PROVIDER_SITE_OTHER): Payer: Medicare Other | Admitting: Family Medicine

## 2013-09-05 ENCOUNTER — Ambulatory Visit: Payer: Medicare Other

## 2013-09-05 VITALS — BP 126/80 | HR 91 | Temp 98.2°F | Wt 143.0 lb

## 2013-09-05 DIAGNOSIS — R300 Dysuria: Secondary | ICD-10-CM

## 2013-09-05 DIAGNOSIS — J019 Acute sinusitis, unspecified: Secondary | ICD-10-CM

## 2013-09-05 LAB — POCT URINALYSIS DIPSTICK
Bilirubin, UA: NEGATIVE
Blood, UA: NEGATIVE
Nitrite, UA: NEGATIVE
Protein, UA: NEGATIVE
pH, UA: 7

## 2013-09-05 MED ORDER — LEVOFLOXACIN 500 MG PO TABS: 500.0000 mg | ORAL_TABLET | Freq: Every day | ORAL | Status: DC

## 2013-09-05 NOTE — Progress Notes (Signed)
  Subjective:    Patient ID: Tonya Hamilton, female    DOB: 01-31-1946, 67 y.o.   MRN: [id number]  HPI  Acute visit. Patient seen with 2 day history of some frequent urination and burning with urination. She has some bilateral flank pain. No nausea or vomiting. No definite fever but she states her temperature is about 1 higher than unusual. She also complains of some sinus symptoms with right facial pain and upper teeth pain. Nasal congestion. Occasional dry cough. Denies sore throat. Sinus symptoms present for a few days.  Past Medical History  Diagnosis Date  . Arthritis   . Diverticulitis   . Hypertension   . Hyperlipidemia   . Thyroid disease   . UTI (urinary tract infection)   . Colon polyps   . PONV (postoperative nausea and vomiting)   . Hypothyroidism    Past Surgical History  Procedure Laterality Date  . Cholecystectomy  2002  . Abdominal hysterectomy  1993    TAH, endometriosis  . Total hip arthroplasty Right 05/31/2013    Procedure: RIGHT TOTAL HIP ARTHROPLASTY ANTERIOR APPROACH;  Surgeon: Loanne Drilling, MD;  Location: [facility];  Service: Orthopedics;  Laterality: Right;    reports that she has never smoked. She has never used smokeless tobacco. She reports that she does not drink alcohol or use illicit drugs. family history includes Arthritis in her mother; Heart disease in her father and mother; Hypertension in her father and mother; Stroke in her father. Allergies  Allergen Reactions  . Statins     Body aches all over      Review of Systems  Constitutional: Positive for fatigue. Negative for chills.  HENT: Positive for sinus pressure. Negative for sore throat.   Respiratory: Positive for cough. Negative for shortness of breath and wheezing.   Neurological: Negative for headaches.       Objective:   Physical Exam  Constitutional: She appears well-developed and well-nourished.  HENT:  Right Ear: External ear normal.  Left Ear: External ear normal.   Mouth/Throat: Oropharynx is clear and moist.  Neck: Neck supple.  Cardiovascular: Normal rate and regular rhythm.   Pulmonary/Chest: Effort normal and breath sounds normal. No respiratory distress. She has no wheezes. She has no rales.  Lymphadenopathy:    She has no cervical adenopathy.          Assessment & Plan:  #1 possible UTI. Start Levaquin 500 milligrams once daily for 7 days (see below) #2 possible acute sinusitis. Given the above, start Levaquin. Avoid amoxicillin because of possible resistance issues (for UTI)

## 2013-09-05 NOTE — Patient Instructions (Signed)

## 2013-09-07 ENCOUNTER — Other Ambulatory Visit: Payer: Self-pay

## 2013-12-15 ENCOUNTER — Telehealth: Payer: Self-pay | Admitting: Family Medicine

## 2013-12-15 NOTE — Telephone Encounter (Signed)
Refill OK times 6

## 2013-12-15 NOTE — Telephone Encounter (Signed)
[id number] - [place], [place] requesting script for AZELASTINE 0.15% NASAL SPRAY(not on pt med list) last filled 07/17/13

## 2013-12-16 MED ORDER — AZELASTINE HCL 0.15 % NA SOLN: 2.0000 | Freq: Two times a day (BID) | NASAL | Status: DC

## 2014-01-13 ENCOUNTER — Other Ambulatory Visit: Payer: Self-pay | Admitting: Family Medicine

## 2014-04-16 ENCOUNTER — Encounter: Payer: Self-pay | Admitting: Family Medicine

## 2014-04-16 ENCOUNTER — Ambulatory Visit (INDEPENDENT_AMBULATORY_CARE_PROVIDER_SITE_OTHER): Payer: Commercial Managed Care - HMO | Admitting: Family Medicine

## 2014-04-16 VITALS — BP 120/68 | HR 81 | Temp 98.5°F | Wt 146.0 lb

## 2014-04-16 DIAGNOSIS — L259 Unspecified contact dermatitis, unspecified cause: Secondary | ICD-10-CM

## 2014-04-16 MED ORDER — METHYLPREDNISOLONE ACETATE 80 MG/ML IJ SUSP
80.0000 mg | Freq: Once | INTRAMUSCULAR | Status: AC
Start: 2014-04-16 — End: 2014-04-16
  Administered 2014-04-16: 80 mg via INTRAMUSCULAR

## 2014-04-16 NOTE — Progress Notes (Signed)
   Subjective:    Patient ID: Tonya Hamilton, female    DOB: 05/07/1946, 68 y.o.   MRN: [id number]  Rash Pertinent negatives include no fever.   Pruritic rash right wrist. Onset of last week. Rash is moderately pruritic. History of similar rash of contact dermatitis in past. No fevers or chills. No associated pain. Aggravated by heat. No alleviating factors. Has received cortisone injection in past for similar rash.  Past Medical History  Diagnosis Date  . Arthritis   . Diverticulitis   . Hypertension   . Hyperlipidemia   . Thyroid disease   . UTI (urinary tract infection)   . Colon polyps   . PONV (postoperative nausea and vomiting)   . Hypothyroidism    Past Surgical History  Procedure Laterality Date  . Cholecystectomy  2002  . Abdominal hysterectomy  1993    TAH, endometriosis  . Total hip arthroplasty Right 05/31/2013    Procedure: RIGHT TOTAL HIP ARTHROPLASTY ANTERIOR APPROACH;  Surgeon: Gearlean Alf, MD;  Location: [facility];  Service: Orthopedics;  Laterality: Right;    reports that she has never smoked. She has never used smokeless tobacco. She reports that she does not drink alcohol or use illicit drugs. family history includes Arthritis in her mother; Heart disease in her father and mother; Hypertension in her father and mother; Stroke in her father. Allergies  Allergen Reactions  . Statins     Body aches all over      Review of Systems  Constitutional: Negative for fever and chills.  Skin: Positive for rash.       Objective:   Physical Exam  Constitutional: She appears well-developed and well-nourished.  Cardiovascular: Normal rate and regular rhythm.   Pulmonary/Chest: Effort normal and breath sounds normal. No respiratory distress. She has no wheezes. She has no rales.  Skin: Rash noted.  Patient has slightly raised vesicular rash with erythematous base right wrist scattered and a few patches. Nontender          Assessment & Plan:  Contact  dermatitis. Patient requesting corticosteroid injection. Reviewed risks and benefits. Depo-Medrol 80 mg IM given

## 2014-04-16 NOTE — Progress Notes (Signed)
Pre visit review using our clinic review tool, if applicable. No additional management support is needed unless otherwise documented below in the visit note. 

## 2014-04-16 NOTE — Patient Instructions (Signed)
Poison Ivy Poison ivy is a inflammation of the skin (contact dermatitis) caused by touching the allergens on the leaves of the ivy plant following previous exposure to the plant. The rash usually appears 48 hours after exposure. The rash is usually bumps (papules) or blisters (vesicles) in a linear pattern. Depending on your own sensitivity, the rash may simply cause redness and itching, or it may also progress to blisters which may break open. These must be well cared for to prevent secondary bacterial (germ) infection, followed by scarring. Keep any open areas dry, clean, dressed, and covered with an antibacterial ointment if needed. The eyes may also get puffy. The puffiness is worst in the morning and gets better as the day progresses. This dermatitis usually heals without scarring, within 2 to 3 weeks without treatment. HOME CARE INSTRUCTIONS  Thoroughly wash with soap and water as soon as you have been exposed to poison ivy. You have about one half hour to remove the plant resin before it will cause the rash. This washing will destroy the oil or antigen on the skin that is causing, or will cause, the rash. Be sure to wash under your fingernails as any plant resin there will continue to spread the rash. Do not rub skin vigorously when washing affected area. Poison ivy cannot spread if no oil from the plant remains on your body. A rash that has progressed to weeping sores will not spread the rash unless you have not washed thoroughly. It is also important to wash any clothes you have been wearing as these may carry active allergens. The rash will return if you wear the unwashed clothing, even several days later. Avoidance of the plant in the future is the best measure. Poison ivy plant can be recognized by the number of leaves. Generally, poison ivy has three leaves with flowering branches on a single stem. Diphenhydramine may be purchased over the counter and used as needed for itching. Do not drive with  this medication if it makes you drowsy.Ask your caregiver about medication for children. SEEK MEDICAL CARE IF:  Open sores develop.  Redness spreads beyond area of rash.  You notice purulent (pus-like) discharge.  You have increased pain.  Other signs of infection develop (such as fever). Document Released: 10/16/2000 Document Revised: 01/11/2012 Document Reviewed: 09/04/2009 [employer] Patient Information 2014 [employer].  

## 2014-04-16 NOTE — Addendum Note (Signed)
Addended by: Marcina Millard on: 04/16/2014 10:31 AM   Modules accepted: Orders

## 2014-05-27 ENCOUNTER — Other Ambulatory Visit: Payer: Self-pay | Admitting: Family Medicine

## 2014-08-10 ENCOUNTER — Ambulatory Visit: Payer: Commercial Managed Care - HMO

## 2014-09-04 ENCOUNTER — Encounter: Payer: Self-pay | Admitting: Family Medicine

## 2014-09-04 ENCOUNTER — Ambulatory Visit (INDEPENDENT_AMBULATORY_CARE_PROVIDER_SITE_OTHER): Payer: Commercial Managed Care - HMO | Admitting: Family Medicine

## 2014-09-04 VITALS — BP 128/82 | HR 65 | Temp 98.1°F | Ht 64.0 in | Wt 144.6 lb

## 2014-09-04 DIAGNOSIS — R300 Dysuria: Secondary | ICD-10-CM

## 2014-09-04 DIAGNOSIS — B349 Viral infection, unspecified: Secondary | ICD-10-CM

## 2014-09-04 LAB — POCT URINALYSIS DIPSTICK
Bilirubin, UA: NEGATIVE
GLUCOSE UA: NEGATIVE
Ketones, UA: NEGATIVE
NITRITE UA: NEGATIVE
Protein, UA: NEGATIVE
RBC UA: NEGATIVE
Spec Grav, UA: 1.01
UROBILINOGEN UA: 0.2
pH, UA: 6

## 2014-09-04 MED ORDER — CIPROFLOXACIN HCL 250 MG PO TABS: 250.0000 mg | ORAL_TABLET | Freq: Two times a day (BID) | ORAL | Status: DC

## 2014-09-04 NOTE — Addendum Note (Signed)
Addended by: Lucretia Kern on: 09/04/2014 11:32 AM   Modules accepted: Orders

## 2014-09-04 NOTE — Addendum Note (Signed)
Addended by: Lahoma Crocker A on: 09/04/2014 11:15 AM   Modules accepted: Orders

## 2014-09-04 NOTE — Progress Notes (Signed)
Pre visit review using our clinic review tool, if applicable. No additional management support is needed unless otherwise documented below in the visit note. 

## 2014-09-04 NOTE — Patient Instructions (Signed)
BEFORE YOU LEAVE: -urine dip and culture - the culture will take several days -follow up with PCP in 1-2 weeks or sooner if needed  Viral Infections A virus is a type of germ. Viruses can cause:  Minor sore throats.  Aches and pains.  Headaches.  Runny nose.  Rashes.  Watery eyes.  Tiredness.  Coughs.  Loss of appetite.  Diarrhea  Feeling sick to your stomach (nausea).  Throwing up (vomiting).  Watery poop (diarrhea). HOME CARE   Only take medicines as told by your doctor.  Drink enough water and fluids to keep your pee (urine) clear or pale yellow. Sports drinks are a good choice.  Get plenty of rest and eat healthy. Soups and broths with crackers or rice are fine. GET HELP RIGHT AWAY IF:   You have a very bad headache.  You have shortness of breath.  You have chest pain or neck pain.  You have an unusual rash.  You cannot stop throwing up.  You have watery poop that does not stop.  You cannot keep fluids down.  You or your child has a temperature by mouth above 102 F (38.9 C), not controlled by medicine.  Your baby is older than 3 months with a rectal temperature of 102 F (38.9 C) or higher.  Your baby is 90 months old or younger with a rectal temperature of 100.4 F (38 C) or higher. MAKE SURE YOU:   Understand these instructions.  Will watch this condition.  Will get help right away if you are not doing well or get worse. Document Released: 10/01/2008 Document Revised: 01/11/2012 Document Reviewed: 02/24/2011 [facility] Patient Information 2015 [place], [place]. This information is not intended to replace advice given to you by your health care provider. Make sure you discuss any questions you have with your health care provider.

## 2014-09-04 NOTE — Progress Notes (Addendum)
No chief complaint on file.   HPI:   Acute visit for:  1) Dysuria: -started about 2 weeks ago, feels like prior UTI -urinary urgency and frequency, low back pain, dysuria -denies: fevers, hematuria, vomiting, flank pain -hx of UTI  2)URI/viral infection: -started the last week -symptoms: nasal congestion, cough, PND, body aches, diarrhea, nausea, chills, R ear pain -denies: fevers, vomiting, flu exposure, recent travel outside the country, SOB or wheezing -has allergies - has not been using her nasal spray   ROS: See pertinent positives and negatives per HPI.  Past Medical History  Diagnosis Date  . Arthritis   . Diverticulitis   . Hypertension   . Hyperlipidemia   . Thyroid disease   . UTI (urinary tract infection)   . Colon polyps   . PONV (postoperative nausea and vomiting)   . Hypothyroidism     Past Surgical History  Procedure Laterality Date  . Cholecystectomy  2002  . Abdominal hysterectomy  1993    TAH, endometriosis  . Total hip arthroplasty Right 05/31/2013    Procedure: RIGHT TOTAL HIP ARTHROPLASTY ANTERIOR APPROACH;  Surgeon: Gearlean Alf, MD;  Location: [facility];  Service: Orthopedics;  Laterality: Right;    Family History  Problem Relation Age of Onset  . Arthritis Mother   . Hypertension Mother   . Heart disease Mother   . Hypertension Father   . Stroke Father   . Heart disease Father     History   Social History  . Marital Status: Married    Spouse Name: N/A    Number of Children: N/A  . Years of Education: N/A   Social History Main Topics  . Smoking status: Never Smoker   . Smokeless tobacco: Never Used  . Alcohol Use: No  . Drug Use: No  . Sexual Activity: None   Other Topics Concern  . None   Social History Narrative    Current outpatient prescriptions: aspirin 81 MG tablet, Take 81 mg by mouth daily., Disp: , Rfl: ;  levothyroxine (SYNTHROID, LEVOTHROID) 25 MCG tablet, TAKE 1 TABLET BY MOUTH DAILY, Disp: 90 tablet, Rfl:  2;  lisinopril (PRINIVIL,ZESTRIL) 40 MG tablet, TAKE 1 TABLET BY MOUTH TWICE DAILY, Disp: 180 tablet, Rfl: 1;  omeprazole (PRILOSEC) 20 MG capsule, Take 20 mg by mouth daily. , Disp: , Rfl:  ciprofloxacin (CIPRO) 250 MG tablet, Take 1 tablet (250 mg total) by mouth 2 (two) times daily., Disp: 6 tablet, Rfl: 0  EXAM:  Filed Vitals:   09/04/14 1044  BP: 128/82  Pulse: 65  Temp: 98.1 F (36.7 C)    Body mass index is 24.81 kg/(m^2).  GENERAL: vitals reviewed and listed above, alert, oriented, appears well hydrated and in no acute distress  HEENT: atraumatic, conjunttiva clear, no obvious abnormalities on inspection of external nose and ears, normal appearance of ear canals and TMs, clear nasal congestion, mild post oropharyngeal erythema with PND, no tonsillar edema or exudate, no sinus TTP  NECK: no obvious masses on inspection  LUNGS: clear to auscultation bilaterally, no wheezes, rales or rhonchi, good air movement  ABD: BS+, soft, NTTP, no CVA TTP  CV: HRRR, no peripheral edema  MS: moves all extremities without noticeable abnormality  PSYCH: pleasant and cooperative, no obvious depression or anxiety  ASSESSMENT AND PLAN:  Discussed the following assessment and plan:  Dysuria -check udip and culture Addendum: leuks on udip and after discussion she opted for empiric abx given similar symptoms to UTI in the past.  Viral illness -benign exam with only upper resp findings -suspect viral illness -advised supportive care and follow up with PCP if persists or new symptoms  -Patient advised to return or notify a doctor immediately if symptoms worsen or persist or new concerns arise.  Patient Instructions  BEFORE YOU LEAVE: -urine dip and culture - the culture will take several days -follow up with PCP in 1-2 weeks or sooner if needed  Viral Infections A virus is a type of germ. Viruses can cause:  Minor sore throats.  Aches and pains.  Headaches.  Runny  nose.  Rashes.  Watery eyes.  Tiredness.  Coughs.  Loss of appetite.  Diarrhea  Feeling sick to your stomach (nausea).  Throwing up (vomiting).  Watery poop (diarrhea). HOME CARE   Only take medicines as told by your doctor.  Drink enough water and fluids to keep your pee (urine) clear or pale yellow. Sports drinks are a good choice.  Get plenty of rest and eat healthy. Soups and broths with crackers or rice are fine. GET HELP RIGHT AWAY IF:   You have a very bad headache.  You have shortness of breath.  You have chest pain or neck pain.  You have an unusual rash.  You cannot stop throwing up.  You have watery poop that does not stop.  You cannot keep fluids down.  You or your child has a temperature by mouth above 102 F (38.9 C), not controlled by medicine.  Your baby is older than 3 months with a rectal temperature of 102 F (38.9 C) or higher.  Your baby is 4 months old or younger with a rectal temperature of 100.4 F (38 C) or higher. MAKE SURE YOU:   Understand these instructions.  Will watch this condition.  Will get help right away if you are not doing well or get worse. Document Released: 10/01/2008 Document Revised: 01/11/2012 Document Reviewed: 02/24/2011 [facility] Patient Information 2015 [place], [place]. This information is not intended to replace advice given to you by your health care provider. Make sure you discuss any questions you have with your health care provider.      Colin Benton R.

## 2014-09-05 ENCOUNTER — Ambulatory Visit: Payer: Commercial Managed Care - HMO | Admitting: Family Medicine

## 2014-09-06 LAB — URINE CULTURE

## 2014-09-19 ENCOUNTER — Ambulatory Visit (INDEPENDENT_AMBULATORY_CARE_PROVIDER_SITE_OTHER): Payer: Commercial Managed Care - HMO

## 2014-09-19 DIAGNOSIS — Z23 Encounter for immunization: Secondary | ICD-10-CM

## 2014-10-23 ENCOUNTER — Encounter: Payer: Self-pay | Admitting: Family Medicine

## 2014-10-23 ENCOUNTER — Ambulatory Visit (INDEPENDENT_AMBULATORY_CARE_PROVIDER_SITE_OTHER): Payer: Commercial Managed Care - HMO | Admitting: Family Medicine

## 2014-10-23 VITALS — BP 130/70 | HR 69 | Wt 142.0 lb

## 2014-10-23 DIAGNOSIS — R1032 Left lower quadrant pain: Secondary | ICD-10-CM

## 2014-10-23 DIAGNOSIS — R300 Dysuria: Secondary | ICD-10-CM

## 2014-10-23 LAB — POCT URINALYSIS DIPSTICK
Bilirubin, UA: NEGATIVE
Glucose, UA: NEGATIVE
Ketones, UA: NEGATIVE
Nitrite, UA: NEGATIVE
PH UA: 5.5
PROTEIN UA: NEGATIVE
SPEC GRAV UA: 1.01
UROBILINOGEN UA: 0.2

## 2014-10-23 MED ORDER — METRONIDAZOLE 500 MG PO TABS: 500.0000 mg | ORAL_TABLET | Freq: Three times a day (TID) | ORAL | Status: DC

## 2014-10-23 MED ORDER — CIPROFLOXACIN HCL 500 MG PO TABS: 500.0000 mg | ORAL_TABLET | Freq: Two times a day (BID) | ORAL | Status: DC

## 2014-10-23 NOTE — Progress Notes (Signed)
   Subjective:    Patient ID: Tonya Hamilton, female    DOB: 19-May-1946, 68 y.o.   MRN: [id number]  HPI Patient just returned from a cruise. She has had couple weeks of some urine frequency and mild burning with urination and some suprapubic pressure. Over the past several day she's also developed some left lower quadrant abdominal pain. This is somewhat reminiscent of when she had acute diverticulitis many years ago. She's had decreased appetite. No nausea or vomiting. She states her fever was 101 on Sunday but none since then. Normal bowel movements. No diarrhea.  No radiation of pain. Pain is moderate at times. No alleviating factors.  Past Medical History  Diagnosis Date  . Arthritis   . Diverticulitis   . Hypertension   . Hyperlipidemia   . Thyroid disease   . UTI (urinary tract infection)   . Colon polyps   . PONV (postoperative nausea and vomiting)   . Hypothyroidism    Past Surgical History  Procedure Laterality Date  . Cholecystectomy  2002  . Abdominal hysterectomy  1993    TAH, endometriosis  . Total hip arthroplasty Right 05/31/2013    Procedure: RIGHT TOTAL HIP ARTHROPLASTY ANTERIOR APPROACH;  Surgeon: Gearlean Alf, MD;  Location: [facility];  Service: Orthopedics;  Laterality: Right;    reports that she has never smoked. She has never used smokeless tobacco. She reports that she does not drink alcohol or use illicit drugs. family history includes Arthritis in her mother; Heart disease in her father and mother; Hypertension in her father and mother; Stroke in her father. Allergies  Allergen Reactions  . Statins     Body aches all over      Review of Systems  Constitutional: Positive for appetite change. Negative for fever and chills.  Respiratory: Negative for cough.   Cardiovascular: Negative for chest pain.  Gastrointestinal: Positive for abdominal pain.  Genitourinary: Positive for dysuria and frequency. Negative for hematuria, vaginal discharge and pelvic pain.        Objective:   Physical Exam  Constitutional: She appears well-developed and well-nourished.  HENT:  Mouth/Throat: Oropharynx is clear and moist.  Cardiovascular: Normal rate and regular rhythm.   Pulmonary/Chest: Effort normal and breath sounds normal. No respiratory distress. She has no wheezes. She has no rales.  Abdominal: Soft. Bowel sounds are normal. She exhibits no distension and no mass. There is tenderness. There is no rebound and no guarding.  Mild tenderness left lower quadrant. No guarding or rebound  Musculoskeletal: She exhibits no edema.          Assessment & Plan:  Patient presents with couple week history of dysuria and couple day history of possible low-grade fever along with left lower quadrant abdominal pain. Suspect she may have recurrent acute diverticulitis. Nonacute abdomen. Urine culture sent. Cover with Cipro 500 milligrams twice a day and Flagyl 500 mg 3 times a day for 10 days. Low residue diet. Follow-up promptly for any fever or worsening symptoms.

## 2014-10-23 NOTE — Patient Instructions (Signed)

## 2014-10-23 NOTE — Progress Notes (Signed)
Pre visit review using our clinic review tool, if applicable. No additional management support is needed unless otherwise documented below in the visit note. 

## 2014-10-24 LAB — URINE CULTURE
COLONY COUNT: NO GROWTH
Organism ID, Bacteria: NO GROWTH

## 2014-10-31 ENCOUNTER — Telehealth: Payer: Self-pay | Admitting: Family Medicine

## 2014-10-31 NOTE — Telephone Encounter (Signed)
Urine cx negative.  OK to take stool softener.

## 2014-10-31 NOTE — Telephone Encounter (Signed)
Pt states she is having trouble having a bm. Would like to know if it is ok to take a stool softener w/ her diverticulitis acting up and the meds she was put on for the UTI as well  Would like to hear if the urine culture has come back yet.  Pt states she is not completely over her diverticulitis and only has one day of meds left. Its better but not gone. pls advise.

## 2014-10-31 NOTE — Telephone Encounter (Signed)
Pt aware.

## 2014-11-04 ENCOUNTER — Encounter (HOSPITAL_BASED_OUTPATIENT_CLINIC_OR_DEPARTMENT_OTHER): Payer: Self-pay | Admitting: *Deleted

## 2014-11-04 ENCOUNTER — Emergency Department (HOSPITAL_BASED_OUTPATIENT_CLINIC_OR_DEPARTMENT_OTHER)
Admission: EM | Admit: 2014-11-04 | Discharge: 2014-11-05 | Disposition: A | Discharge status: Home / Self Care | Payer: Commercial Managed Care - HMO | Attending: Emergency Medicine | Admitting: Emergency Medicine

## 2014-11-04 DIAGNOSIS — Y9389 Activity, other specified: Secondary | ICD-10-CM | POA: Insufficient documentation

## 2014-11-04 DIAGNOSIS — I10 Essential (primary) hypertension: Secondary | ICD-10-CM | POA: Insufficient documentation

## 2014-11-04 DIAGNOSIS — X58XXXA Exposure to other specified factors, initial encounter: Secondary | ICD-10-CM | POA: Diagnosis not present

## 2014-11-04 DIAGNOSIS — E039 Hypothyroidism, unspecified: Secondary | ICD-10-CM | POA: Insufficient documentation

## 2014-11-04 DIAGNOSIS — Z86010 Personal history of colonic polyps: Secondary | ICD-10-CM | POA: Diagnosis not present

## 2014-11-04 DIAGNOSIS — Z792 Long term (current) use of antibiotics: Secondary | ICD-10-CM | POA: Insufficient documentation

## 2014-11-04 DIAGNOSIS — M1990 Unspecified osteoarthritis, unspecified site: Secondary | ICD-10-CM | POA: Diagnosis not present

## 2014-11-04 DIAGNOSIS — Z8719 Personal history of other diseases of the digestive system: Secondary | ICD-10-CM | POA: Diagnosis not present

## 2014-11-04 DIAGNOSIS — Z79899 Other long term (current) drug therapy: Secondary | ICD-10-CM | POA: Insufficient documentation

## 2014-11-04 DIAGNOSIS — Y998 Other external cause status: Secondary | ICD-10-CM | POA: Insufficient documentation

## 2014-11-04 DIAGNOSIS — Y9289 Other specified places as the place of occurrence of the external cause: Secondary | ICD-10-CM | POA: Diagnosis not present

## 2014-11-04 DIAGNOSIS — T783XXA Angioneurotic edema, initial encounter: Secondary | ICD-10-CM | POA: Insufficient documentation

## 2014-11-04 DIAGNOSIS — Z7982 Long term (current) use of aspirin: Secondary | ICD-10-CM | POA: Diagnosis not present

## 2014-11-04 DIAGNOSIS — E079 Disorder of thyroid, unspecified: Secondary | ICD-10-CM | POA: Diagnosis not present

## 2014-11-04 DIAGNOSIS — Z87440 Personal history of urinary (tract) infections: Secondary | ICD-10-CM | POA: Insufficient documentation

## 2014-11-04 MED ORDER — METHYLPREDNISOLONE SODIUM SUCC 125 MG IJ SOLR
125.0000 mg | Freq: Once | INTRAMUSCULAR | Status: AC
  Administered 2014-11-04: 125 mg via INTRAVENOUS
  Filled 2014-11-04: qty 2

## 2014-11-04 MED ORDER — FAMOTIDINE IN NACL 20-0.9 MG/50ML-% IV SOLN
20.0000 mg | Freq: Once | INTRAVENOUS | Status: AC
  Administered 2014-11-04: 20 mg via INTRAVENOUS
  Filled 2014-11-04: qty 50

## 2014-11-04 NOTE — ED Notes (Addendum)
C/o lip swelling, onset at 2200, facial & lip swelling noted, minimal throat swelling noted. Mentions sensation of itching on chin. Takes lisinopril, took 2 benadryl at 2205. Alert, NAD, calm, interactive, resps e/u, speaking in clear complete sentences, no dyspnea noted. No hives noted. Husband with pt.

## 2014-11-04 NOTE — ED Provider Notes (Signed)
CSN: [id number]     Arrival date & time 11/04/14  2306 History  This chart was scribed for Malvin Johns, MD by Lowella Petties, ED Scribe. The patient was seen in room MH06/MH06. Patient's care was started at 11:33 PM.    Chief Complaint  Patient presents with  . Angioedema   The history is provided by the patient. No language interpreter was used.   HPI Comments: Tonya Hamilton is a 69 y.o. female who presents to the Emergency Department complaining of gradually worsening, upper lip and chin swelling which began 1.5 hours ago. She reports associated itchiness on her chin. She denies tongue swelling. She denies SOB. She denies having a reaction like this in the past. She denies starting any new medications or using any new soaps or lotions. She reports talking lisinopril tonight as usual, and she has been taking this medication for the past 10 years. She reports taking 2 benadryl at 10:05 without relief. She reports an attack of diverticulitis which began December 26th, and she has completed her course of ABX for this.   Past Medical History  Diagnosis Date  . Arthritis   . Diverticulitis   . Hypertension   . Hyperlipidemia   . Thyroid disease   . UTI (urinary tract infection)   . Colon polyps   . PONV (postoperative nausea and vomiting)   . Hypothyroidism    Past Surgical History  Procedure Laterality Date  . Cholecystectomy  2002  . Abdominal hysterectomy  1993    TAH, endometriosis  . Total hip arthroplasty Right 05/31/2013    Procedure: RIGHT TOTAL HIP ARTHROPLASTY ANTERIOR APPROACH;  Surgeon: Gearlean Alf, MD;  Location: [facility];  Service: Orthopedics;  Laterality: Right;   Family History  Problem Relation Age of Onset  . Arthritis Mother   . Hypertension Mother   . Heart disease Mother   . Hypertension Father   . Stroke Father   . Heart disease Father    History  Substance Use Topics  . Smoking status: Never Smoker   . Smokeless tobacco: Never Used  . Alcohol Use:  No   OB History    No data available     Review of Systems  Constitutional: Negative for fever, chills, diaphoresis and fatigue.  HENT: Positive for facial swelling. Negative for congestion, rhinorrhea and sneezing.   Eyes: Negative.   Respiratory: Negative for cough, chest tightness and shortness of breath.   Cardiovascular: Negative for chest pain and leg swelling.  Gastrointestinal: Negative for nausea, vomiting, abdominal pain, diarrhea and blood in stool.  Genitourinary: Negative for frequency, hematuria, flank pain and difficulty urinating.  Musculoskeletal: Negative for back pain and arthralgias.  Skin: Positive for rash.  Neurological: Negative for dizziness, speech difficulty, weakness, numbness and headaches.    Allergies  Statins  Home Medications   Prior to Admission medications   Medication Sig Start Date End Date Taking? Authorizing Provider  aspirin 81 MG tablet Take 81 mg by mouth daily.    Historical Provider, MD  ciprofloxacin (CIPRO) 500 MG tablet Take 1 tablet (500 mg total) by mouth 2 (two) times daily. 10/23/14   [name], MD  levothyroxine (SYNTHROID, LEVOTHROID) 25 MCG tablet TAKE 1 TABLET BY MOUTH DAILY    [name], MD  metroNIDAZOLE (FLAGYL) 500 MG tablet Take 1 tablet (500 mg total) by mouth 3 (three) times daily. 10/23/14   [name], MD  omeprazole (PRILOSEC) 20 MG capsule Take 20 mg by mouth  daily.  09/14/12   Historical Provider, MD  predniSONE (DELTASONE) 20 MG tablet 3 tabs po day one, then 2 po daily x 4 days 11/05/14   Malvin Johns, MD   Triage Vitals: BP 150/65 mmHg  Pulse 73  Temp(Src) 98.4 F (36.9 C) (Oral)  Resp 18  SpO2 97% Physical Exam  Constitutional: She is oriented to person, place, and time. She appears well-developed and well-nourished.  HENT:  Head: Normocephalic and atraumatic.  Patient swelling to the upper lip and some swelling to the chin. There some erythema to the face as well. There is no  tongue swelling. No trismus.  Eyes: Pupils are equal, round, and reactive to light.  Neck: Normal range of motion. Neck supple.  Cardiovascular: Normal rate, regular rhythm and normal heart sounds.   Pulmonary/Chest: Effort normal and breath sounds normal. No respiratory distress. She has no wheezes. She has no rales. She exhibits no tenderness.  Abdominal: Soft. Bowel sounds are normal. There is no tenderness. There is no rebound and no guarding.  Musculoskeletal: Normal range of motion. She exhibits no edema.  Lymphadenopathy:    She has no cervical adenopathy.  Neurological: She is alert and oriented to person, place, and time.  Skin: Skin is warm and dry. No rash noted.  Psychiatric: She has a normal mood and affect.    ED Course  Procedures (including critical care time) DIAGNOSTIC STUDIES: Oxygen Saturation is 97% on room air, normal by my interpretation.    COORDINATION OF CARE: 11:39 PM-Discussed treatment plan which includes lab work, Solu-medrol, Benadryl, and Pepcid with pt at bedside and pt agreed to plan.   Labs Review Labs Reviewed - No data to display  Imaging Review No results found.   EKG Interpretation None      MDM   Final diagnoses:  Angioedema, initial encounter    Patient is given Solu-Medrol and Pepcid. She started taken 50 mg of Benadryl prior to arrival. She was monitored in the ED for about 3 and half hours. She had continuing improvement of symptoms. She never had any tongue swelling or airway compromise. Her symptoms are likely related to her lisinopril. I advised her not to take anymore of the lisinopril and contact her primary care physician tomorrow regarding switching her medication to something else for blood pressure management. Strict return precautions were given. She was discharged with a prescription for a five-day course of prednisone to start tomorrow.  I personally performed the services described in this documentation, which was  scribed in my presence.  The recorded information has been reviewed and considered.    Malvin Johns, MD 11/05/14 [phone]

## 2014-11-05 ENCOUNTER — Telehealth: Payer: Self-pay | Admitting: Family Medicine

## 2014-11-05 MED ORDER — PREDNISONE 20 MG PO TABS: ORAL_TABLET | ORAL | Status: DC

## 2014-11-05 NOTE — Telephone Encounter (Signed)
ED notes in chart.

## 2014-11-05 NOTE — Telephone Encounter (Signed)
Pt went to ed Sunday and diagnosed w/ Angioedema.  Advised pt to call dr Elease Hashimoto and have her  lisinopril (PRINIVIL,ZESTRIL) 40 MG tablet dc'd and switched to something else. Pt would like generic whatever it is.  Do you want pt to make a fup?  Advised pt to make appt. She did not want to, but states if she really needs to come in she will.  Pt states feels fine. Pt will start prednisone tonight.  [facility]/ [address]

## 2014-11-05 NOTE — Discharge Instructions (Signed)
Angioedema °Angioedema is a sudden swelling of tissues, often of the skin. It can occur on the face or genitals or in the abdomen or other body parts. The swelling usually develops over a short period and gets better in 24 to 48 hours. It often begins during the night and is found when the person wakes up. The person may also get red, itchy patches of skin (hives). Angioedema can be dangerous if it involves swelling of the air passages.  °Depending on the cause, episodes of angioedema may only happen once, come back in unpredictable patterns, or repeat for several years and then gradually fade away.  °CAUSES  °Angioedema can be caused by an allergic reaction to various triggers. It can also result from nonallergic causes, including reactions to drugs, immune system disorders, viral infections, or an abnormal gene that is passed to you from your parents (hereditary). For some people with angioedema, the cause is unknown.  °Some things that can trigger angioedema include:  °· Foods.   °· Medicines, such as ACE inhibitors, ARBs, nonsteroidal anti-inflammatory agents, or estrogen.   °· Latex.   °· Animal saliva.   °· Insect stings.   °· Dyes used in X-rays.   °· Mild injury.   °· Dental work. °· Surgery. °· Stress.   °· Sudden changes in temperature.   °· Exercise. °SIGNS AND SYMPTOMS  °· Swelling of the skin. °· Hives. If these are present, there is also intense itching. °· Redness in the affected area.   °· Pain in the affected area. °· Swollen lips or tongue. °· Breathing problems. This may happen if the air passages swell. °· Wheezing. °If internal organs are involved, there may be:  °· Nausea.   °· Abdominal pain.   °· Vomiting.   °· Difficulty swallowing.   °· Difficulty passing urine. °DIAGNOSIS  °· Your health care provider will examine the affected area and take a medical and family history. °· Various tests may be done to help determine the cause. Tests may include: °¨ Allergy skin tests to see if the problem  is an allergic reaction.   °¨ Blood tests to check for hereditary angioedema.   °¨ Tests to check for underlying diseases that could cause the condition.   °· A review of your medicines, including over-the-counter medicines, may be done. °TREATMENT  °Treatment will depend on the cause of the angioedema. Possible treatments include:  °· Removal of anything that triggered the condition (such as stopping certain medicines).   °· Medicines to treat symptoms or prevent attacks. Medicines given may include:   °¨ Antihistamines.   °¨ Epinephrine injection.   °¨ Steroids.   °· Hospitalization may be required for severe attacks. If the air passages are affected, it can be an emergency. Tubes may need to be placed to keep the airway open. °HOME CARE INSTRUCTIONS  °· Take all medicines as directed by your health care provider. °· If you were given medicines for emergency allergy treatment, always carry them with you. °· Wear a medical bracelet as directed by your health care provider.   °· Avoid known triggers. °SEEK MEDICAL CARE IF:  °· You have repeat attacks of angioedema.   °· Your attacks are more frequent or more severe despite preventive measures.   °· You have hereditary angioedema and are considering having children. It is important to discuss with your health care provider the risks of passing the condition on to your children. °SEEK IMMEDIATE MEDICAL CARE IF:  °· You have severe swelling of the mouth, tongue, or lips. °· You have difficulty breathing.   °· You have difficulty swallowing.   °· You faint. °MAKE   SURE YOU: °· Understand these instructions. °· Will watch your condition. °· Will get help right away if you are not doing well or get worse. °Document Released: 12/28/2001 Document Revised: 03/05/2014 Document Reviewed: 06/12/2013 °Exit[employer]® Patient Information ©2015 [employer]. This information is not intended to replace advice given to you by your health care provider. Make sure you discuss any questions  you have with your health care provider. ° °

## 2014-11-05 NOTE — Telephone Encounter (Signed)
Really does need follow up.  We need to consider several factors in deciding which to change to.

## 2014-11-06 ENCOUNTER — Ambulatory Visit (INDEPENDENT_AMBULATORY_CARE_PROVIDER_SITE_OTHER): Payer: Commercial Managed Care - HMO | Admitting: Family Medicine

## 2014-11-06 ENCOUNTER — Encounter: Payer: Self-pay | Admitting: Family Medicine

## 2014-11-06 VITALS — BP 162/78 | HR 81 | Temp 98.1°F | Wt 141.0 lb

## 2014-11-06 DIAGNOSIS — I10 Essential (primary) hypertension: Secondary | ICD-10-CM

## 2014-11-06 DIAGNOSIS — T783XXA Angioneurotic edema, initial encounter: Secondary | ICD-10-CM

## 2014-11-06 MED ORDER — EPINEPHRINE 0.3 MG/0.3ML IJ SOAJ: 0.3000 mg | Freq: Once | INTRAMUSCULAR | Status: DC

## 2014-11-06 MED ORDER — AMLODIPINE BESYLATE 5 MG PO TABS: 5.0000 mg | ORAL_TABLET | Freq: Every day | ORAL | Status: DC

## 2014-11-06 NOTE — Patient Instructions (Signed)
Angioedema °Angioedema is a sudden swelling of tissues, often of the skin. It can occur on the face or genitals or in the abdomen or other body parts. The swelling usually develops over a short period and gets better in 24 to 48 hours. It often begins during the night and is found when the person wakes up. The person may also get red, itchy patches of skin (hives). Angioedema can be dangerous if it involves swelling of the air passages.  °Depending on the cause, episodes of angioedema may only happen once, come back in unpredictable patterns, or repeat for several years and then gradually fade away.  °CAUSES  °Angioedema can be caused by an allergic reaction to various triggers. It can also result from nonallergic causes, including reactions to drugs, immune system disorders, viral infections, or an abnormal gene that is passed to you from your parents (hereditary). For some people with angioedema, the cause is unknown.  °Some things that can trigger angioedema include:  °· Foods.   °· Medicines, such as ACE inhibitors, ARBs, nonsteroidal anti-inflammatory agents, or estrogen.   °· Latex.   °· Animal saliva.   °· Insect stings.   °· Dyes used in X-rays.   °· Mild injury.   °· Dental work. °· Surgery. °· Stress.   °· Sudden changes in temperature.   °· Exercise. °SIGNS AND SYMPTOMS  °· Swelling of the skin. °· Hives. If these are present, there is also intense itching. °· Redness in the affected area.   °· Pain in the affected area. °· Swollen lips or tongue. °· Breathing problems. This may happen if the air passages swell. °· Wheezing. °If internal organs are involved, there may be:  °· Nausea.   °· Abdominal pain.   °· Vomiting.   °· Difficulty swallowing.   °· Difficulty passing urine. °DIAGNOSIS  °· Your health care provider will examine the affected area and take a medical and family history. °· Various tests may be done to help determine the cause. Tests may include: °¨ Allergy skin tests to see if the problem  is an allergic reaction.   °¨ Blood tests to check for hereditary angioedema.   °¨ Tests to check for underlying diseases that could cause the condition.   °· A review of your medicines, including over-the-counter medicines, may be done. °TREATMENT  °Treatment will depend on the cause of the angioedema. Possible treatments include:  °· Removal of anything that triggered the condition (such as stopping certain medicines).   °· Medicines to treat symptoms or prevent attacks. Medicines given may include:   °¨ Antihistamines.   °¨ Epinephrine injection.   °¨ Steroids.   °· Hospitalization may be required for severe attacks. If the air passages are affected, it can be an emergency. Tubes may need to be placed to keep the airway open. °HOME CARE INSTRUCTIONS  °· Take all medicines as directed by your health care provider. °· If you were given medicines for emergency allergy treatment, always carry them with you. °· Wear a medical bracelet as directed by your health care provider.   °· Avoid known triggers. °SEEK MEDICAL CARE IF:  °· You have repeat attacks of angioedema.   °· Your attacks are more frequent or more severe despite preventive measures.   °· You have hereditary angioedema and are considering having children. It is important to discuss with your health care provider the risks of passing the condition on to your children. °SEEK IMMEDIATE MEDICAL CARE IF:  °· You have severe swelling of the mouth, tongue, or lips. °· You have difficulty breathing.   °· You have difficulty swallowing.   °· You faint. °MAKE   SURE YOU: °· Understand these instructions. °· Will watch your condition. °· Will get help right away if you are not doing well or get worse. °Document Released: 12/28/2001 Document Revised: 03/05/2014 Document Reviewed: 06/12/2013 °Exit[employer]® Patient Information ©2015 [employer]. This information is not intended to replace advice given to you by your health care provider. Make sure you discuss any questions  you have with your health care provider. ° °

## 2014-11-06 NOTE — Progress Notes (Signed)
Pre visit review using our clinic review tool, if applicable. No additional management support is needed unless otherwise documented below in the visit note. 

## 2014-11-06 NOTE — Progress Notes (Signed)
   Subjective:    Patient ID: Tonya Hamilton, female    DOB: 1945/11/21, 69 y.o.   MRN: [id number]  HPI Patient seen for ER follow-up. She presented there on January 3 with some upper lip and chin edema. This started about one half hours prior to admission there. She had taken her lisinopril it just minutes before onset of the symptoms. She took 2 Benadryl initially and then reported to ED for further day way should. She denied any tongue swelling or any shortness of breath. No prior history of angioedema reaction. No history of food allergy. She had been on lisinopril for several years. She was treated with site Medrol and Pepcid and Benadryl and symptoms quickly resolved. She has not had any difficulty since then. She is finishing up course of prednisone. It was felt that her angioedema type reaction was likely related to lisinopril. She was not given prescription for EpiPen. She's had no recent skin rash.  Not monitoring blood pressure since discharge. Currently on no blood pressure medication. No headaches or dizziness.  Past Medical History  Diagnosis Date  . Arthritis   . Diverticulitis   . Hypertension   . Hyperlipidemia   . Thyroid disease   . UTI (urinary tract infection)   . Colon polyps   . PONV (postoperative nausea and vomiting)   . Hypothyroidism    Past Surgical History  Procedure Laterality Date  . Cholecystectomy  2002  . Abdominal hysterectomy  1993    TAH, endometriosis  . Total hip arthroplasty Right 05/31/2013    Procedure: RIGHT TOTAL HIP ARTHROPLASTY ANTERIOR APPROACH;  Surgeon: Gearlean Alf, MD;  Location: [facility];  Service: Orthopedics;  Laterality: Right;    reports that she has never smoked. She has never used smokeless tobacco. She reports that she does not drink alcohol or use illicit drugs. family history includes Arthritis in her mother; Heart disease in her father and mother; Hypertension in her father and mother; Stroke in her father. Allergies    Allergen Reactions  . Lisinopril Swelling  . Statins     Body aches all over     Review of Systems  Constitutional: Negative for fatigue.  Eyes: Negative for visual disturbance.  Respiratory: Negative for cough, chest tightness, shortness of breath and wheezing.   Cardiovascular: Negative for chest pain, palpitations and leg swelling.  Neurological: Negative for dizziness, seizures, syncope, weakness, light-headedness and headaches.       Objective:   Physical Exam  Constitutional: She appears well-developed and well-nourished.  HENT:  Mouth/Throat: Oropharynx is clear and moist.  Neck: Neck supple.  Cardiovascular: Normal rate and regular rhythm.   No murmur heard. Pulmonary/Chest: Effort normal and breath sounds normal. No respiratory distress. She has no wheezes. She has no rales.  Skin: No rash noted.          Assessment & Plan:  #1 recent angioedema type reaction probably related to ACE inhibitor. She's not had any further recurrence. Avoid ACE inhibitor and also angiotensin receptor blockers. Prescription for EpiPen given. Follow-up immediately for any recurrent angioedema type symptoms. #2 hypertension currently untreated. Elevated systolic reading today. Start amlodipine 5 mg once daily. Reassess blood pressure one month

## 2014-11-06 NOTE — Telephone Encounter (Signed)
Confirmed w/ pt, she will be here.

## 2014-11-07 ENCOUNTER — Other Ambulatory Visit: Payer: Self-pay

## 2014-11-07 DIAGNOSIS — Z1231 Encounter for screening mammogram for malignant neoplasm of breast: Secondary | ICD-10-CM

## 2014-11-08 ENCOUNTER — Telehealth: Payer: Self-pay | Admitting: Family Medicine

## 2014-11-08 NOTE — Telephone Encounter (Signed)
emmi emailed °

## 2014-11-20 ENCOUNTER — Ambulatory Visit
Admission: RE | Admit: 2014-11-20 | Discharge: 2014-11-20 | Disposition: A | Discharge status: Home / Self Care | Payer: Commercial Managed Care - HMO | Source: Ambulatory Visit

## 2014-11-20 DIAGNOSIS — Z1231 Encounter for screening mammogram for malignant neoplasm of breast: Secondary | ICD-10-CM

## 2014-12-07 ENCOUNTER — Encounter: Payer: Self-pay | Admitting: Family Medicine

## 2014-12-07 ENCOUNTER — Ambulatory Visit (INDEPENDENT_AMBULATORY_CARE_PROVIDER_SITE_OTHER): Payer: Commercial Managed Care - HMO | Admitting: Family Medicine

## 2014-12-07 VITALS — BP 130/74 | HR 78 | Temp 97.9°F | Wt 139.0 lb

## 2014-12-07 DIAGNOSIS — I10 Essential (primary) hypertension: Secondary | ICD-10-CM

## 2014-12-07 NOTE — Progress Notes (Signed)
Pre visit review using our clinic review tool, if applicable. No additional management support is needed unless otherwise documented below in the visit note. 

## 2014-12-07 NOTE — Progress Notes (Signed)
   Subjective:    Patient ID: Tonya Hamilton, female    DOB: 1946-04-03, 69 y.o.   MRN: [id number]  HPI Patient seen for follow-up hypertension. We added amlodipine last visit. She's had previous probable angioedema with ACE inhibitor and thus we were avoiding ace inhibitors and angiotensin receptor blockers. Blood pressure greatly improved. No headaches. No peripheral edema. Compliant with diet. Watches sodium closely. No excessive alcohol use.  Past Medical History  Diagnosis Date  . Arthritis   . Diverticulitis   . Hypertension   . Hyperlipidemia   . Thyroid disease   . UTI (urinary tract infection)   . Colon polyps   . PONV (postoperative nausea and vomiting)   . Hypothyroidism    Past Surgical History  Procedure Laterality Date  . Cholecystectomy  2002  . Abdominal hysterectomy  1993    TAH, endometriosis  . Total hip arthroplasty Right 05/31/2013    Procedure: RIGHT TOTAL HIP ARTHROPLASTY ANTERIOR APPROACH;  Surgeon: Gearlean Alf, MD;  Location: [facility];  Service: Orthopedics;  Laterality: Right;    reports that she has never smoked. She has never used smokeless tobacco. She reports that she does not drink alcohol or use illicit drugs. family history includes Arthritis in her mother; Heart disease in her father and mother; Hypertension in her father and mother; Stroke in her father. Allergies  Allergen Reactions  . Lisinopril Swelling  . Statins     Body aches all over      Review of Systems  Constitutional: Negative for fatigue.  Eyes: Negative for visual disturbance.  Respiratory: Negative for cough, chest tightness, shortness of breath and wheezing.   Cardiovascular: Negative for chest pain, palpitations and leg swelling.  Neurological: Negative for dizziness, seizures, syncope, weakness, light-headedness and headaches.       Objective:   Physical Exam  Constitutional: She appears well-developed and well-nourished.  Cardiovascular: Normal rate and regular  rhythm.  Exam reveals no gallop.   Pulmonary/Chest: Effort normal and breath sounds normal. No respiratory distress. She has no wheezes. She has no rales.  Musculoskeletal: She exhibits no edema.          Assessment & Plan:  #1 hypertension. Improved and at goal. Continue amlodipine. Information on DASH diet given. Routine follow-up 6 months

## 2014-12-07 NOTE — Patient Instructions (Signed)
DASH Eating Plan °DASH stands for "Dietary Approaches to Stop Hypertension." The DASH eating plan is a healthy eating plan that has been shown to reduce high blood pressure (hypertension). Additional health benefits may include reducing the risk of type 2 diabetes mellitus, heart disease, and stroke. The DASH eating plan may also help with weight loss. °WHAT DO I NEED TO KNOW ABOUT THE DASH EATING PLAN? °For the DASH eating plan, you will follow these general guidelines: °· Choose foods with a percent daily value for sodium of less than 5% (as listed on the food label). °· Use salt-free seasonings or herbs instead of table salt or sea salt. °· Check with your health care provider or pharmacist before using salt substitutes. °· Eat lower-sodium products, often labeled as "lower sodium" or "no salt added." °· Eat fresh foods. °· Eat more vegetables, fruits, and low-fat dairy products. °· Choose whole grains. Look for the word "whole" as the first word in the ingredient list. °· Choose fish and skinless chicken or turkey more often than red meat. Limit fish, poultry, and meat to 6 oz (170 g) each day. °· Limit sweets, desserts, sugars, and sugary drinks. °· Choose heart-healthy fats. °· Limit cheese to 1 oz (28 g) per day. °· Eat more home-cooked food and less restaurant, buffet, and fast food. °· Limit fried foods. °· Cook foods using methods other than frying. °· Limit canned vegetables. If you do use them, rinse them well to decrease the sodium. °· When eating at a restaurant, ask that your food be prepared with less salt, or no salt if possible. °WHAT FOODS CAN I EAT? °Seek help from a dietitian for individual calorie needs. °Grains °Whole grain or whole wheat bread. Brown rice. Whole grain or whole wheat pasta. Quinoa, bulgur, and whole grain cereals. Low-sodium cereals. Corn or whole wheat flour tortillas. Whole grain cornbread. Whole grain crackers. Low-sodium crackers. °Vegetables °Fresh or frozen vegetables  (raw, steamed, roasted, or grilled). Low-sodium or reduced-sodium tomato and vegetable juices. Low-sodium or reduced-sodium tomato sauce and paste. Low-sodium or reduced-sodium canned vegetables.  °Fruits °All fresh, canned (in natural juice), or frozen fruits. °Meat and Other Protein Products °Ground beef (85% or leaner), grass-fed beef, or beef trimmed of fat. Skinless chicken or turkey. Ground chicken or turkey. Pork trimmed of fat. All fish and seafood. Eggs. Dried beans, peas, or lentils. Unsalted nuts and seeds. Unsalted canned beans. °Dairy °Low-fat dairy products, such as skim or 1% milk, 2% or reduced-fat cheeses, low-fat ricotta or cottage cheese, or plain low-fat yogurt. Low-sodium or reduced-sodium cheeses. °Fats and Oils °Tub margarines without trans fats. Light or reduced-fat mayonnaise and salad dressings (reduced sodium). Avocado. Safflower, olive, or canola oils. Natural peanut or almond butter. °Other °Unsalted popcorn and pretzels. °The items listed above may not be a complete list of recommended foods or beverages. Contact your dietitian for more options. °WHAT FOODS ARE NOT RECOMMENDED? °Grains °White bread. White pasta. White rice. Refined cornbread. Bagels and croissants. Crackers that contain trans fat. °Vegetables °Creamed or fried vegetables. Vegetables in a cheese sauce. Regular canned vegetables. Regular canned tomato sauce and paste. Regular tomato and vegetable juices. °Fruits °Dried fruits. Canned fruit in light or heavy syrup. Fruit juice. °Meat and Other Protein Products °Fatty cuts of meat. Ribs, chicken wings, bacon, sausage, bologna, salami, chitterlings, fatback, hot dogs, bratwurst, and packaged luncheon meats. Salted nuts and seeds. Canned beans with salt. °Dairy °Whole or 2% milk, cream, half-and-half, and cream cheese. Whole-fat or sweetened yogurt. Full-fat   cheeses or blue cheese. Nondairy creamers and whipped toppings. Processed cheese, cheese spreads, or cheese  curds. °Condiments °Onion and garlic salt, seasoned salt, table salt, and sea salt. Canned and packaged gravies. Worcestershire sauce. Tartar sauce. Barbecue sauce. Teriyaki sauce. Soy sauce, including reduced sodium. Steak sauce. Fish sauce. Oyster sauce. Cocktail sauce. Horseradish. Ketchup and mustard. Meat flavorings and tenderizers. Bouillon cubes. Hot sauce. Tabasco sauce. Marinades. Taco seasonings. Relishes. °Fats and Oils °Butter, stick margarine, lard, shortening, ghee, and bacon fat. Coconut, palm kernel, or palm oils. Regular salad dressings. °Other °Pickles and olives. Salted popcorn and pretzels. °The items listed above may not be a complete list of foods and beverages to avoid. Contact your dietitian for more information. °WHERE CAN I FIND MORE INFORMATION? °[employer], [employer], [employer]: www.nhlbi.nih.gov/health/health-topics/topics/dash/ °Document Released: 10/08/2011 Document Revised: 03/05/2014 Document Reviewed: 08/23/2013 °Exit[employer]® Patient Information ©2015 [employer]. This information is not intended to replace advice given to you by your health care provider. Make sure you discuss any questions you have with your health care provider. ° °

## 2014-12-31 ENCOUNTER — Encounter: Payer: Self-pay | Admitting: Family Medicine

## 2014-12-31 ENCOUNTER — Ambulatory Visit (INDEPENDENT_AMBULATORY_CARE_PROVIDER_SITE_OTHER): Payer: Commercial Managed Care - HMO | Admitting: Family Medicine

## 2014-12-31 DIAGNOSIS — R300 Dysuria: Secondary | ICD-10-CM

## 2014-12-31 LAB — POCT URINALYSIS DIPSTICK
Bilirubin, UA: NEGATIVE
Glucose, UA: NEGATIVE
Ketones, UA: NEGATIVE
Nitrite, UA: POSITIVE
PROTEIN UA: NEGATIVE
Urobilinogen, UA: 0.2
pH, UA: 5

## 2014-12-31 MED ORDER — CEPHALEXIN 500 MG PO CAPS: 500.0000 mg | ORAL_CAPSULE | Freq: Three times a day (TID) | ORAL | Status: DC

## 2014-12-31 NOTE — Patient Instructions (Signed)

## 2014-12-31 NOTE — Progress Notes (Signed)
Pre visit review using our clinic review tool, if applicable. No additional management support is needed unless otherwise documented below in the visit note. 

## 2014-12-31 NOTE — Progress Notes (Signed)
   Subjective:    Patient ID: Tonya Hamilton, female    DOB: 1946/05/03, 69 y.o.   MRN: [id number]  HPI Patient seen for acute visit with 3 day history of urine frequency and burning with urination. She started Azo-Standard couple days ago. This helped slightly. She's not any fevers or chills. No flank pain. No nausea or vomiting. History of frequent UTIs in the past  Past Medical History  Diagnosis Date  . Arthritis   . Diverticulitis   . Hypertension   . Hyperlipidemia   . Thyroid disease   . UTI (urinary tract infection)   . Colon polyps   . PONV (postoperative nausea and vomiting)   . Hypothyroidism    Past Surgical History  Procedure Laterality Date  . Cholecystectomy  2002  . Abdominal hysterectomy  1993    TAH, endometriosis  . Total hip arthroplasty Right 05/31/2013    Procedure: RIGHT TOTAL HIP ARTHROPLASTY ANTERIOR APPROACH;  Surgeon: Gearlean Alf, MD;  Location: [facility];  Service: Orthopedics;  Laterality: Right;    reports that she has never smoked. She has never used smokeless tobacco. She reports that she does not drink alcohol or use illicit drugs. family history includes Arthritis in her mother; Heart disease in her father and mother; Hypertension in her father and mother; Stroke in her father. Allergies  Allergen Reactions  . Lisinopril Swelling  . Statins     Body aches all over      Review of Systems  Constitutional: Negative for fever, chills and appetite change.  Gastrointestinal: Negative for nausea, vomiting, abdominal pain, diarrhea and constipation.  Genitourinary: Positive for dysuria and frequency.  Musculoskeletal: Negative for back pain.  Neurological: Negative for dizziness.       Objective:   Physical Exam  Constitutional: She appears well-developed and well-nourished.  HENT:  Head: Normocephalic and atraumatic.  Cardiovascular: Normal rate, regular rhythm and normal heart sounds.   Pulmonary/Chest: Breath sounds normal.          Assessment & Plan:  Dysuria. Suspect uncomplicated cystitis. Urine culture sent. Keflex 500 mg 3 times a day for 7 days pending culture

## 2015-01-03 LAB — URINE CULTURE: Colony Count: >=100000

## 2015-02-15 ENCOUNTER — Ambulatory Visit (INDEPENDENT_AMBULATORY_CARE_PROVIDER_SITE_OTHER): Payer: Commercial Managed Care - HMO | Admitting: Adult Health

## 2015-02-15 ENCOUNTER — Encounter: Payer: Self-pay | Admitting: Adult Health

## 2015-02-15 VITALS — BP 124/78 | Temp 98.3°F | Wt 140.0 lb

## 2015-02-15 DIAGNOSIS — N390 Urinary tract infection, site not specified: Secondary | ICD-10-CM

## 2015-02-15 DIAGNOSIS — R300 Dysuria: Secondary | ICD-10-CM

## 2015-02-15 LAB — POCT URINALYSIS DIPSTICK
BILIRUBIN UA: NEGATIVE
Glucose, UA: NEGATIVE
KETONES UA: NEGATIVE
Nitrite, UA: POSITIVE
PROTEIN UA: NEGATIVE
RBC UA: NEGATIVE
Spec Grav, UA: <=1.005
Urobilinogen, UA: 0.2
pH, UA: 6

## 2015-02-15 MED ORDER — CIPROFLOXACIN HCL 500 MG PO TABS: 500.0000 mg | ORAL_TABLET | Freq: Two times a day (BID) | ORAL | Status: DC

## 2015-02-15 MED ORDER — PHENAZOPYRIDINE HCL 200 MG PO TABS: 200.0000 mg | ORAL_TABLET | Freq: Three times a day (TID) | ORAL | Status: DC | PRN

## 2015-02-15 NOTE — Progress Notes (Signed)
   Subjective:    Patient ID: Tonya Hamilton, female    DOB: 03-Apr-1946, 69 y.o.   MRN: [id number]  HPI  Patient presents to the office for UTI type symptoms. She endorses burning with urination that started this morning around 5:30 am. Endorses back pain, frequency and odor.   Took Azo with slight relief.   Review of Systems  Constitutional: Negative for fever and chills.  Gastrointestinal: Positive for abdominal pain (pressure in lower abdomen). Negative for nausea, vomiting, diarrhea, constipation, abdominal distention and rectal pain.  Genitourinary: Positive for dysuria, urgency, frequency and flank pain. Negative for hematuria, vaginal discharge, difficulty urinating and vaginal pain.       Objective:   Physical Exam  Constitutional: She is oriented to person, place, and time. She appears well-developed and well-nourished.  Abdominal: She exhibits no distension and no mass. There is no tenderness. There is no rebound and no guarding.  Musculoskeletal: Normal range of motion. She exhibits no edema or tenderness.  Neurological: She is alert and oriented to person, place, and time.  Skin: Skin is warm and dry. No rash noted. No erythema. No pallor.  Psychiatric: She has a normal mood and affect. Her behavior is normal. Judgment normal.        Assessment & Plan:  1. Dysuria  - POCT urinalysis dipstick; Standing - POCT urinalysis dipstick - Culture, Urine  2. Urinary tract infection without hematuria, site unspecified - Prescription for Cipro 500 mg BID x 7 days given  - Prescription for Pyridium 200mg  TID PRN given for symptom relief.  - Ambulatory referral to Urology for constant UTI's

## 2015-02-15 NOTE — Progress Notes (Signed)
Pre visit review using our clinic review tool, if applicable. No additional management support is needed unless otherwise documented below in the visit note. 

## 2015-02-15 NOTE — Patient Instructions (Signed)

## 2015-02-18 ENCOUNTER — Encounter: Payer: Self-pay | Admitting: Adult Health

## 2015-02-18 ENCOUNTER — Telehealth: Payer: Self-pay | Admitting: Family Medicine

## 2015-02-18 MED ORDER — CEPHALEXIN 500 MG PO CAPS: 500.0000 mg | ORAL_CAPSULE | Freq: Three times a day (TID) | ORAL | Status: DC

## 2015-02-18 NOTE — Telephone Encounter (Signed)
Called and spoke with pt and pt is aware new rx sent to pharmacy.  Pt verbalizes understanding. Advised pt to call office if she experiences any difficulty with new medication.

## 2015-02-18 NOTE — Addendum Note (Signed)
Addended by: Apolinar Junes on: 02/18/2015 09:57 AM   Modules accepted: Orders

## 2015-02-18 NOTE — Telephone Encounter (Signed)
Pt called to say that the following medicine made her sick ciprofloxacin (CIPRO) 500 MG tablet   She is asking if something else can be called in for her    [place] [facility]

## 2015-02-18 NOTE — Telephone Encounter (Signed)
Sent script for Keflex, take three times a day for 7 days.

## 2015-02-18 NOTE — Telephone Encounter (Signed)
Pls advise.  

## 2015-02-19 LAB — URINE CULTURE: Colony Count: >=100000

## 2015-03-08 ENCOUNTER — Encounter: Payer: Self-pay | Admitting: Family Medicine

## 2015-03-08 ENCOUNTER — Ambulatory Visit (INDEPENDENT_AMBULATORY_CARE_PROVIDER_SITE_OTHER): Payer: Commercial Managed Care - HMO | Admitting: Family Medicine

## 2015-03-08 VITALS — BP 128/74 | HR 82 | Temp 98.2°F | Wt 137.0 lb

## 2015-03-08 DIAGNOSIS — R300 Dysuria: Secondary | ICD-10-CM

## 2015-03-08 LAB — POCT URINALYSIS DIPSTICK
Bilirubin, UA: NEGATIVE
Blood, UA: NEGATIVE
Glucose, UA: NEGATIVE
Ketones, UA: NEGATIVE
Nitrite, UA: NEGATIVE
PH UA: 5.5
PROTEIN UA: NEGATIVE
SPEC GRAV UA: 1.015
Urobilinogen, UA: 0.2

## 2015-03-08 MED ORDER — NITROFURANTOIN MONOHYD MACRO 100 MG PO CAPS
100.0000 mg | ORAL_CAPSULE | Freq: Two times a day (BID) | ORAL | Status: DC
Start: 2015-03-08 — End: 2015-07-09

## 2015-03-08 NOTE — Progress Notes (Signed)
   Subjective:    Patient ID: Tonya Hamilton, female    DOB: 1946/05/04, 69 y.o.   MRN: [id number]  HPI Acute visit for dysuria. Patient has history of recurrent UTI. She was treated just a few weeks ago for Klebsiella pneumonia with Cipro. She developed some abdominal cramps and pain and was transitioned over to Keflex. Her symptoms did improve but she is not for sure if these fully resolve. She has some burning with urination and frequency for the past couple days. No fevers or chills. No flank pain. No nausea or vomiting. She has pending appointment with urology scheduled  Past Medical History  Diagnosis Date  . Arthritis   . Diverticulitis   . Hypertension   . Hyperlipidemia   . Thyroid disease   . UTI (urinary tract infection)   . Colon polyps   . PONV (postoperative nausea and vomiting)   . Hypothyroidism    Past Surgical History  Procedure Laterality Date  . Cholecystectomy  2002  . Abdominal hysterectomy  1993    TAH, endometriosis  . Total hip arthroplasty Right 05/31/2013    Procedure: RIGHT TOTAL HIP ARTHROPLASTY ANTERIOR APPROACH;  Surgeon: Gearlean Alf, MD;  Location: [facility];  Service: Orthopedics;  Laterality: Right;    reports that she has never smoked. She has never used smokeless tobacco. She reports that she does not drink alcohol or use illicit drugs. family history includes Arthritis in her mother; Heart disease in her father and mother; Hypertension in her father and mother; Stroke in her father. Allergies  Allergen Reactions  . Celecoxib   . Diclofenac   . Lisinopril Swelling  . Simvastatin   . Statins     Body aches all over      Review of Systems  Constitutional: Negative for fever and chills.  Gastrointestinal: Negative for nausea, vomiting and abdominal pain.  Genitourinary: Positive for dysuria.       Objective:   Physical Exam  Constitutional: She appears well-developed and well-nourished.  Cardiovascular: Normal rate and regular rhythm.    Pulmonary/Chest: Effort normal and breath sounds normal. No respiratory distress. She has no wheezes. She has no rales.  Neurological: She is alert.          Assessment & Plan:  Recurrent dysuria. Urine dipstick reveals 1+ leukocytes, otherwise negative. Urine culture sent. Macrobid 1 twice a day for 7 days pending culture results.

## 2015-03-08 NOTE — Progress Notes (Signed)
Pre visit review using our clinic review tool, if applicable. No additional management support is needed unless otherwise documented below in the visit note. 

## 2015-03-08 NOTE — Patient Instructions (Signed)

## 2015-03-10 LAB — URINE CULTURE

## 2015-06-11 ENCOUNTER — Other Ambulatory Visit: Payer: Self-pay | Admitting: Family Medicine

## 2015-06-11 ENCOUNTER — Ambulatory Visit (INDEPENDENT_AMBULATORY_CARE_PROVIDER_SITE_OTHER): Payer: Commercial Managed Care - HMO | Admitting: Family Medicine

## 2015-06-11 ENCOUNTER — Encounter: Payer: Self-pay | Admitting: Family Medicine

## 2015-06-11 VITALS — BP 130/80 | HR 69 | Temp 97.8°F | Wt 137.0 lb

## 2015-06-11 DIAGNOSIS — J069 Acute upper respiratory infection, unspecified: Secondary | ICD-10-CM

## 2015-06-11 DIAGNOSIS — R21 Rash and other nonspecific skin eruption: Secondary | ICD-10-CM | POA: Diagnosis not present

## 2015-06-11 DIAGNOSIS — B9789 Other viral agents as the cause of diseases classified elsewhere: Principal | ICD-10-CM

## 2015-06-11 DIAGNOSIS — J029 Acute pharyngitis, unspecified: Secondary | ICD-10-CM | POA: Diagnosis not present

## 2015-06-11 LAB — POCT RAPID STREP A (OFFICE): Rapid Strep A Screen: NEGATIVE

## 2015-06-11 NOTE — Addendum Note (Signed)
Addended by: Noe Gens E on: 06/11/2015 01:10 PM   Modules accepted: Orders

## 2015-06-11 NOTE — Progress Notes (Signed)
Pre visit review using our clinic review tool, if applicable. No additional management support is needed unless otherwise documented below in the visit note. 

## 2015-06-11 NOTE — Patient Instructions (Addendum)
Viral Infections A viral infection can be caused by different types of viruses.Most viral infections are not serious and resolve on their own. However, some infections may cause severe symptoms and may lead to further complications. SYMPTOMS Viruses can frequently cause:  Minor sore throat.  Aches and pains.  Headaches.  Runny nose.  Different types of rashes.  Watery eyes.  Tiredness.  Cough.  Loss of appetite.  Gastrointestinal infections, resulting in nausea, vomiting, and diarrhea. These symptoms do not respond to antibiotics because the infection is not caused by bacteria. However, you might catch a bacterial infection following the viral infection. This is sometimes called a "superinfection." Symptoms of such a bacterial infection may include:  Worsening sore throat with pus and difficulty swallowing.  Swollen neck glands.  Chills and a high or persistent fever.  Severe headache.  Tenderness over the sinuses.  Persistent overall ill feeling (malaise), muscle aches, and tiredness (fatigue).  Persistent cough.  Yellow, green, or brown mucus production with coughing. HOME CARE INSTRUCTIONS   Only take over-the-counter or prescription medicines for pain, discomfort, diarrhea, or fever as directed by your caregiver.  Drink enough water and fluids to keep your urine clear or pale yellow. Sports drinks can provide valuable electrolytes, sugars, and hydration.  Get plenty of rest and maintain proper nutrition. Soups and broths with crackers or rice are fine. SEEK IMMEDIATE MEDICAL CARE IF:   You have severe headaches, shortness of breath, chest pain, neck pain, or an unusual rash.  You have uncontrolled vomiting, diarrhea, or you are unable to keep down fluids.  You or your child has an oral temperature above 102 F (38.9 C), not controlled by medicine.  Your baby is older than 3 months with a rectal temperature of 102 F (38.9 C) or higher.  Your baby is 60  months old or younger with a rectal temperature of 100.4 F (38 C) or higher. MAKE SURE YOU:   Understand these instructions.  Will watch your condition.  Will get help right away if you are not doing well or get worse. Document Released: 07/29/2005 Document Revised: 01/11/2012 Document Reviewed: 02/23/2011 [facility] Patient Information 2015 [place], [place]. This information is not intended to replace advice given to you by your health care provider. Make sure you discuss any questions you have with your health care provider.   Stay well hydrated Continue with Advil as needed for sore throat and body aches Consider Afrin nasal as needed for nasal congestion but no more than 3 days continuous May also use Flonase or Nasacort for nasal congestion.

## 2015-06-11 NOTE — Progress Notes (Signed)
   Subjective:    Patient ID: Tonya Hamilton, female    DOB: [birth year], 69 y.o.   MRN: [id number]  HPI Patient seen for couple of issues  Acute upper respiratory illness. Started about 4 days ago. She's had some nasal congestion, sore throat, intermittent headaches, body aches, and sinusitis symptoms, and dry cough. Increased fatigue. No sick contacts. Denies any nausea, vomiting, or diarrhea. She's taken Advil with mild relief of sore throat. Cough is relatively mild. No dyspnea.  Small scaly spot right anterior leg. She was using some type of prescription medication her husband had- presumably steroid-induced which has helped slightly.  Past Medical History  Diagnosis Date  . Arthritis   . Diverticulitis   . Hypertension   . Hyperlipidemia   . Thyroid disease   . UTI (urinary tract infection)   . Colon polyps   . PONV (postoperative nausea and vomiting)   . Hypothyroidism    Past Surgical History  Procedure Laterality Date  . Cholecystectomy  2002  . Abdominal hysterectomy  1993    TAH, endometriosis  . Total hip arthroplasty Right 05/31/2013    Procedure: RIGHT TOTAL HIP ARTHROPLASTY ANTERIOR APPROACH;  Surgeon: Gearlean Alf, MD;  Location: [facility];  Service: Orthopedics;  Laterality: Right;    reports that she has never smoked. She has never used smokeless tobacco. She reports that she does not drink alcohol or use illicit drugs. family history includes Arthritis in her mother; Heart disease in her father and mother; Hypertension in her father and mother; Stroke in her father. Allergies  Allergen Reactions  . Celecoxib   . Diclofenac   . Lisinopril Swelling  . Simvastatin   . Statins     Body aches all over     Review of Systems  Constitutional: Positive for chills and fatigue. Negative for fever.  HENT: Positive for congestion, postnasal drip and sore throat.   Respiratory: Positive for cough.   Cardiovascular: Negative for chest pain.  Neurological: Positive  for headaches.       Objective:   Physical Exam  Constitutional: She appears well-developed and well-nourished.  HENT:  Right Ear: External ear normal.  Left Ear: External ear normal.  Oropharynx is erythematous. No exudate.  Neck: Neck supple.  Cardiovascular: Normal rate and regular rhythm.   Pulmonary/Chest: Effort normal and breath sounds normal. No respiratory distress. She has no wheezes. She has no rales.  Lymphadenopathy:    She has no cervical adenopathy.  Skin: Rash noted.  Right anterior leg approximately 1 cm area which is slightly scaly. Nontender. No pustules. No vesicles.          Assessment & Plan:  #1 viral URI with cough. Rapid strep is negative. Treat symptomatically. Stay well-hydrated. Continue Advil as needed. She will try Flonase or Nasacort for nasal congestive symptoms. #2 nonspecific rash right anterior leg. Questionable reaction from recent bite. Continue topical cortisone cream. No signs of abscess or infection

## 2015-06-13 ENCOUNTER — Telehealth: Payer: Self-pay | Admitting: Family Medicine

## 2015-06-13 NOTE — Telephone Encounter (Signed)
Pt seen tues for sore throat. Pt states she is not any better. Pt states this is the worst sore throat she can ever remember having. Pt was told she should be better by Friday, and pt has seen no improvement.   Worse if anything. Would like your advice please   [employer] [facility] [place]

## 2015-06-14 MED ORDER — AMOXICILLIN 875 MG PO TABS: 875.0000 mg | ORAL_TABLET | Freq: Two times a day (BID) | ORAL | Status: DC

## 2015-06-14 NOTE — Telephone Encounter (Signed)
Pt is informed. Rx sent to pharmacy

## 2015-06-14 NOTE — Telephone Encounter (Signed)
Let's call in Amoxicillin 875 mg po bid for 10 days.  We will be happy to see her back if necessary.

## 2015-07-09 ENCOUNTER — Ambulatory Visit (INDEPENDENT_AMBULATORY_CARE_PROVIDER_SITE_OTHER): Payer: Commercial Managed Care - HMO | Admitting: Family Medicine

## 2015-07-09 ENCOUNTER — Encounter: Payer: Self-pay | Admitting: Family Medicine

## 2015-07-09 VITALS — BP 128/78 | HR 82 | Temp 97.8°F | Wt 138.0 lb

## 2015-07-09 DIAGNOSIS — R300 Dysuria: Secondary | ICD-10-CM

## 2015-07-09 LAB — POCT URINALYSIS DIPSTICK
BILIRUBIN UA: NEGATIVE
GLUCOSE UA: NEGATIVE
KETONES UA: NEGATIVE
Nitrite, UA: NEGATIVE
Protein, UA: NEGATIVE
Spec Grav, UA: <=1.005
UROBILINOGEN UA: 0.2
pH, UA: 6

## 2015-07-09 MED ORDER — PHENAZOPYRIDINE HCL 200 MG PO TABS: 200.0000 mg | ORAL_TABLET | Freq: Three times a day (TID) | ORAL | Status: DC | PRN

## 2015-07-09 MED ORDER — CEPHALEXIN 500 MG PO CAPS: 500.0000 mg | ORAL_CAPSULE | Freq: Three times a day (TID) | ORAL | Status: DC

## 2015-07-09 NOTE — Progress Notes (Signed)
   Subjective:    Patient ID: Tonya Hamilton, female    DOB: August 15, 1946, 69 y.o.   MRN: [id number]  HPI Acute visit for today history of dysuria. Frequent urination and burning with urination. She took some Pyridium which has helped symptomatically. She had some chills last night but no documented fever. No nausea or vomiting. No flank pain. No pelvic pain. History of frequent UTIs in the past. No gross hematuria  Past Medical History  Diagnosis Date  . Arthritis   . Diverticulitis   . Hypertension   . Hyperlipidemia   . Thyroid disease   . UTI (urinary tract infection)   . Colon polyps   . PONV (postoperative nausea and vomiting)   . Hypothyroidism    Past Surgical History  Procedure Laterality Date  . Cholecystectomy  2002  . Abdominal hysterectomy  1993    TAH, endometriosis  . Total hip arthroplasty Right 05/31/2013    Procedure: RIGHT TOTAL HIP ARTHROPLASTY ANTERIOR APPROACH;  Surgeon: Gearlean Alf, MD;  Location: [facility];  Service: Orthopedics;  Laterality: Right;    reports that she has never smoked. She has never used smokeless tobacco. She reports that she does not drink alcohol or use illicit drugs. family history includes Arthritis in her mother; Heart disease in her father and mother; Hypertension in her father and mother; Stroke in her father. Allergies  Allergen Reactions  . Celecoxib   . Diclofenac   . Lisinopril Swelling  . Simvastatin   . Statins     Body aches all over      Review of Systems  Constitutional: Negative for fever, chills and appetite change.  Gastrointestinal: Negative for nausea, vomiting, abdominal pain, diarrhea and constipation.  Genitourinary: Positive for dysuria and frequency.  Musculoskeletal: Negative for back pain.  Neurological: Negative for dizziness.       Objective:   Physical Exam  Constitutional: She appears well-developed and well-nourished.  HENT:  Head: Normocephalic and atraumatic.  Neck: Neck supple. No  thyromegaly present.  Cardiovascular: Normal rate, regular rhythm and normal heart sounds.   Pulmonary/Chest: Breath sounds normal.  Abdominal: Soft. Bowel sounds are normal. There is no tenderness.          Assessment & Plan:  Dysuria. Probable uncomplicated cystitis. Urine culture sent. Keflex 500 milligrams 3 times a day pending culture results. Refill Pyridium for as needed use.

## 2015-07-09 NOTE — Progress Notes (Signed)
Pre visit review using our clinic review tool, if applicable. No additional management support is needed unless otherwise documented below in the visit note. 

## 2015-07-09 NOTE — Patient Instructions (Signed)

## 2015-07-12 LAB — URINE CULTURE: Colony Count: >=100000

## 2015-07-17 ENCOUNTER — Telehealth: Payer: Self-pay

## 2015-07-17 NOTE — Telephone Encounter (Signed)
Open by mistake

## 2015-07-18 ENCOUNTER — Other Ambulatory Visit (INDEPENDENT_AMBULATORY_CARE_PROVIDER_SITE_OTHER): Payer: Commercial Managed Care - HMO

## 2015-07-18 ENCOUNTER — Other Ambulatory Visit: Payer: Self-pay | Admitting: Family Medicine

## 2015-07-18 DIAGNOSIS — R300 Dysuria: Secondary | ICD-10-CM

## 2015-07-18 LAB — POCT URINALYSIS DIPSTICK
Bilirubin, UA: NEGATIVE
Blood, UA: NEGATIVE
Glucose, UA: NEGATIVE
Ketones, UA: NEGATIVE
NITRITE UA: NEGATIVE
PROTEIN UA: NEGATIVE
SPEC GRAV UA: 1.015
UROBILINOGEN UA: 0.2
pH, UA: 5.5

## 2015-07-19 LAB — URINE CULTURE

## 2015-10-30 ENCOUNTER — Other Ambulatory Visit: Payer: Self-pay | Admitting: Family Medicine

## 2015-12-10 ENCOUNTER — Other Ambulatory Visit: Payer: Self-pay | Admitting: Family Medicine

## 2015-12-31 ENCOUNTER — Other Ambulatory Visit: Payer: Self-pay

## 2015-12-31 DIAGNOSIS — Z1231 Encounter for screening mammogram for malignant neoplasm of breast: Secondary | ICD-10-CM

## 2016-01-02 ENCOUNTER — Ambulatory Visit (INDEPENDENT_AMBULATORY_CARE_PROVIDER_SITE_OTHER): Payer: Commercial Managed Care - HMO | Admitting: Family Medicine

## 2016-01-02 VITALS — BP 142/82 | HR 82 | Temp 98.5°F | Ht 64.0 in | Wt 139.0 lb

## 2016-01-02 DIAGNOSIS — R300 Dysuria: Secondary | ICD-10-CM | POA: Diagnosis not present

## 2016-01-02 DIAGNOSIS — J019 Acute sinusitis, unspecified: Secondary | ICD-10-CM | POA: Diagnosis not present

## 2016-01-02 LAB — POCT URINALYSIS DIPSTICK
Bilirubin, UA: NEGATIVE
Blood, UA: NEGATIVE
Glucose, UA: NEGATIVE
Ketones, UA: NEGATIVE
Nitrite, UA: NEGATIVE
Protein, UA: NEGATIVE
Spec Grav, UA: 1.025
UROBILINOGEN UA: 0.2
pH, UA: 5.5

## 2016-01-02 MED ORDER — DOXYCYCLINE HYCLATE 100 MG PO CAPS: 100.0000 mg | ORAL_CAPSULE | Freq: Two times a day (BID) | ORAL | Status: DC

## 2016-01-02 NOTE — Progress Notes (Signed)
Pre visit review using our clinic review tool, if applicable. No additional management support is needed unless otherwise documented below in the visit note. 

## 2016-01-02 NOTE — Progress Notes (Signed)
   Subjective:    Patient ID: Tonya Hamilton, female    DOB: [birth year], 70 y.o.   MRN: [id number]  HPI  Patient seen for the following issues  Onset this past Sunday of some urinary frequency and burning. Symptoms somewhat intermittent. No flank pain. No fever. Chills intermittently.   Patient noticed onset about the same time of increased maxillary and frontal sinus pressure bilaterally with some colored nasal discharge. Mild headaches. Increased malaise. Occasional dry cough. Intermittent sore throat.  Past Medical History  Diagnosis Date  . Arthritis   . Diverticulitis   . Hypertension   . Hyperlipidemia   . Thyroid disease   . UTI (urinary tract infection)   . Colon polyps   . PONV (postoperative nausea and vomiting)   . Hypothyroidism    Past Surgical History  Procedure Laterality Date  . Cholecystectomy  2002  . Abdominal hysterectomy  1993    TAH, endometriosis  . Total hip arthroplasty Right 05/31/2013    Procedure: RIGHT TOTAL HIP ARTHROPLASTY ANTERIOR APPROACH;  Surgeon: Gearlean Alf, MD;  Location: [facility];  Service: Orthopedics;  Laterality: Right;    reports that she has never smoked. She has never used smokeless tobacco. She reports that she does not drink alcohol or use illicit drugs. family history includes Arthritis in her mother; Heart disease in her father and mother; Hypertension in her father and mother; Stroke in her father. Allergies  Allergen Reactions  . Celecoxib   . Diclofenac   . Lisinopril Swelling  . Simvastatin   . Statins     Body aches all over     Review of Systems  Constitutional: Negative for fever, chills and appetite change.  HENT: Positive for congestion and sinus pressure.   Respiratory: Positive for cough.   Gastrointestinal: Negative for nausea, vomiting, abdominal pain, diarrhea and constipation.  Genitourinary: Positive for dysuria and frequency.  Musculoskeletal: Negative for back pain.  Neurological: Negative for  dizziness.       Objective:   Physical Exam  Constitutional: She appears well-developed and well-nourished.  HENT:  Right Ear: External ear normal.  Left Ear: External ear normal.  Mouth/Throat: Oropharynx is clear and moist.  Neck: Neck supple.  Cardiovascular: Normal rate and regular rhythm.   Pulmonary/Chest: Effort normal. No respiratory distress. She has no wheezes. She has no rales.  She has a few coarse rhonchi in both bases. No rales  Lymphadenopathy:    She has no cervical adenopathy.          Assessment & Plan:   #1 dysuria. Urine dipstick reveals small leukocytes otherwise negative. Urine culture sent. Cover with doxycycline 100 mg twice daily  #2 acute sinusitis. Question viral. Covering with antibiotics as above for possible UTI.

## 2016-01-02 NOTE — Patient Instructions (Signed)

## 2016-01-05 LAB — URINE CULTURE
Colony Count: NO GROWTH
ORGANISM ID, BACTERIA: NO GROWTH

## 2016-02-06 ENCOUNTER — Ambulatory Visit
Admission: RE | Admit: 2016-02-06 | Discharge: 2016-02-06 | Disposition: A | Discharge status: Home / Self Care | Payer: Commercial Managed Care - HMO | Source: Ambulatory Visit

## 2016-02-06 DIAGNOSIS — Z1231 Encounter for screening mammogram for malignant neoplasm of breast: Secondary | ICD-10-CM

## 2016-04-07 ENCOUNTER — Ambulatory Visit (INDEPENDENT_AMBULATORY_CARE_PROVIDER_SITE_OTHER): Payer: Commercial Managed Care - HMO | Admitting: Family Medicine

## 2016-04-07 VITALS — HR 100 | Temp 98.2°F | Ht 64.0 in | Wt 141.0 lb

## 2016-04-07 DIAGNOSIS — R300 Dysuria: Secondary | ICD-10-CM | POA: Diagnosis not present

## 2016-04-07 LAB — POCT URINALYSIS DIPSTICK
BILIRUBIN UA: NEGATIVE
GLUCOSE UA: NEGATIVE
KETONES UA: NEGATIVE
NITRITE UA: NEGATIVE
Protein, UA: NEGATIVE
Spec Grav, UA: 1.01
Urobilinogen, UA: 0.2
pH, UA: 5.5

## 2016-04-07 MED ORDER — TRIMETHOPRIM 100 MG PO TABS: 100.0000 mg | ORAL_TABLET | Freq: Every day | ORAL | Status: DC

## 2016-04-07 MED ORDER — NITROFURANTOIN MONOHYD MACRO 100 MG PO CAPS: 100.0000 mg | ORAL_CAPSULE | Freq: Two times a day (BID) | ORAL | Status: DC

## 2016-04-07 NOTE — Patient Instructions (Signed)

## 2016-04-07 NOTE — Progress Notes (Signed)
Pre visit review using our clinic review tool, if applicable. No additional management support is needed unless otherwise documented below in the visit note. 

## 2016-04-07 NOTE — Progress Notes (Signed)
   Subjective:    Patient ID: Tonya Hamilton, female    DOB: 07-16-1946, 70 y.o.   MRN: [id number]  HPI Sunday night chills and fatigue Onset yesterday on urine frequency and burning. No fever.  No flank pain She's had multiple episodes over the past year of dysuria. On many occasions her cultures have come back negative. No history of pyelonephritis  Past Medical History  Diagnosis Date  . Arthritis   . Diverticulitis   . Hypertension   . Hyperlipidemia   . Thyroid disease   . UTI (urinary tract infection)   . Colon polyps   . PONV (postoperative nausea and vomiting)   . Hypothyroidism    Past Surgical History  Procedure Laterality Date  . Cholecystectomy  2002  . Abdominal hysterectomy  1993    TAH, endometriosis  . Total hip arthroplasty Right 05/31/2013    Procedure: RIGHT TOTAL HIP ARTHROPLASTY ANTERIOR APPROACH;  Surgeon: Gearlean Alf, MD;  Location: [facility];  Service: Orthopedics;  Laterality: Right;    reports that she has never smoked. She has never used smokeless tobacco. She reports that she does not drink alcohol or use illicit drugs. family history includes Arthritis in her mother; Heart disease in her father and mother; Hypertension in her father and mother; Stroke in her father. Allergies  Allergen Reactions  . Celecoxib   . Diclofenac   . Lisinopril Swelling  . Simvastatin   . Statins     Body aches all over      Review of Systems  Constitutional: Negative for fever, chills and appetite change.  Gastrointestinal: Negative for nausea, vomiting, abdominal pain, diarrhea and constipation.  Genitourinary: Positive for dysuria and frequency.  Musculoskeletal: Negative for back pain.  Neurological: Negative for dizziness.       Objective:   Physical Exam  Constitutional: She appears well-developed and well-nourished.  HENT:  Head: Normocephalic and atraumatic.  Neck: Neck supple. No thyromegaly present.  Cardiovascular: Normal rate, regular  rhythm and normal heart sounds.   Pulmonary/Chest: Breath sounds normal.  Abdominal: Soft. Bowel sounds are normal. There is no tenderness.          Assessment & Plan:  Recurrent dysuria. Urine culture sent. Urine dipstick reveals 2+ blood and leukocytes. Cover with Macrobid 1 twice a day for 7 days pending culture results. She had several questions regarding prophylactic antibiotic. Even though some of her cultures have been negative consider low-dose trimethoprim as prophylaxis- 100 mg po qd.  Tonya Post MD [facility] at [facility]

## 2016-04-10 LAB — URINE CULTURE: Colony Count: 80000

## 2016-04-22 ENCOUNTER — Ambulatory Visit: Payer: Commercial Managed Care - HMO | Admitting: Family Medicine

## 2016-05-01 ENCOUNTER — Ambulatory Visit (INDEPENDENT_AMBULATORY_CARE_PROVIDER_SITE_OTHER): Payer: Commercial Managed Care - HMO | Admitting: Family Medicine

## 2016-05-01 DIAGNOSIS — Z23 Encounter for immunization: Secondary | ICD-10-CM | POA: Diagnosis not present

## 2016-06-03 ENCOUNTER — Other Ambulatory Visit: Payer: Self-pay | Admitting: Family Medicine

## 2016-07-01 ENCOUNTER — Ambulatory Visit (INDEPENDENT_AMBULATORY_CARE_PROVIDER_SITE_OTHER): Payer: Commercial Managed Care - HMO | Admitting: Family Medicine

## 2016-07-01 VITALS — BP 120/74 | HR 113 | Temp 98.2°F | Ht 64.0 in | Wt 139.4 lb

## 2016-07-01 DIAGNOSIS — Z23 Encounter for immunization: Secondary | ICD-10-CM | POA: Diagnosis not present

## 2016-07-01 DIAGNOSIS — H6121 Impacted cerumen, right ear: Secondary | ICD-10-CM

## 2016-07-01 DIAGNOSIS — J0140 Acute pansinusitis, unspecified: Secondary | ICD-10-CM

## 2016-07-01 MED ORDER — AMOXICILLIN-POT CLAVULANATE 875-125 MG PO TABS: 1.0000 | ORAL_TABLET | Freq: Two times a day (BID) | ORAL | 0 refills | Status: DC

## 2016-07-01 NOTE — Progress Notes (Signed)
Subjective:     Patient ID: Tonya Hamilton, female   DOB: 1946/10/08, 70 y.o.   MRN: [id number]  HPI Patient here for the following items  Requesting flu vaccination. No contraindications  2 month history of intermittent headaches which have become almost daily. She has facial pressure and tenderness over her maxillary sinuses and frontal sinuses bilaterally with occasional headaches occipital area. Increased fatigue. No fevers or chills. Frequent postnasal drip symptoms. Occasional hoarseness. No cough. Headaches and facial pressure worse when lying flat. Also has frequent upper teeth pain. No fevers or chills  Increased fullness in the right ear. No dizziness. No total hearing loss.  Past Medical History:  Diagnosis Date  . Arthritis   . Colon polyps   . Diverticulitis   . Hyperlipidemia   . Hypertension   . Hypothyroidism   . PONV (postoperative nausea and vomiting)   . Thyroid disease   . UTI (urinary tract infection)    Past Surgical History:  Procedure Laterality Date  . ABDOMINAL HYSTERECTOMY  1993   TAH, endometriosis  . CHOLECYSTECTOMY  2002  . TOTAL HIP ARTHROPLASTY Right 05/31/2013   Procedure: RIGHT TOTAL HIP ARTHROPLASTY ANTERIOR APPROACH;  Surgeon: Gearlean Alf, MD;  Location: [facility];  Service: Orthopedics;  Laterality: Right;    reports that she has never smoked. She has never used smokeless tobacco. She reports that she does not drink alcohol or use drugs. family history includes Arthritis in her mother; Heart disease in her father and mother; Hypertension in her father and mother; Stroke in her father. Allergies  Allergen Reactions  . Celecoxib   . Diclofenac   . Lisinopril Swelling  . Simvastatin   . Statins     Body aches all over     Review of Systems  Constitutional: Positive for fatigue. Negative for chills and fever.  HENT: Positive for congestion and sinus pressure.   Respiratory: Negative for cough.   Cardiovascular: Negative for chest pain.   Neurological: Positive for headaches.       Objective:   Physical Exam  Constitutional: She is oriented to person, place, and time. She appears well-developed and well-nourished.  HENT:  Cerumen impaction right canal. Left eardrum appears normal. Nasal mucosa erythematous otherwise normal  Neck: Neck supple.  Cardiovascular: Normal rate and regular rhythm.   Pulmonary/Chest: Effort normal and breath sounds normal. No respiratory distress. She has no wheezes. She has no rales.  Lymphadenopathy:    She has no cervical adenopathy.  Neurological: She is alert and oriented to person, place, and time. No cranial nerve deficit.       Assessment:     #1 cerumen impaction right ear canal- this was removed by irrigation with no difficulty.  #2 probable pansinusitis involving maxillary and frontal sinuses bilaterally with symptoms over 2 months duration    Plan:     -Flu vaccine given -Irrigation right ear canal -Augmentin 875 mrem twice daily for 10 days -Drink plenty fluids and consider plain Mucinex. Touch base in 2 weeks if symptoms not resolving  [name] MD [facility] at [facility]

## 2016-07-01 NOTE — Progress Notes (Signed)
Pre visit review using our clinic review tool, if applicable. No additional management support is needed unless otherwise documented below in the visit note. 

## 2016-07-01 NOTE — Patient Instructions (Signed)

## 2016-08-06 ENCOUNTER — Other Ambulatory Visit: Payer: Self-pay | Admitting: Family Medicine

## 2016-08-31 ENCOUNTER — Other Ambulatory Visit: Payer: Self-pay | Admitting: Family Medicine

## 2016-09-14 ENCOUNTER — Telehealth: Payer: Self-pay | Admitting: Family Medicine

## 2016-09-14 NOTE — Telephone Encounter (Signed)
Pt would like to have the Rx Celebrex that you all spoke about at last visit.  [facility] [address].

## 2016-09-14 NOTE — Telephone Encounter (Signed)
Pt would like to try Celebrex per your last OV conversation.    Dr. Elease Hashimoto - Please advise. Thanks!

## 2016-09-14 NOTE — Telephone Encounter (Signed)
We have Celebrex listed on her allergy list.  I would not feel comfortable giving without further clarification.

## 2016-09-15 NOTE — Telephone Encounter (Signed)
Pt has made appt to discuss further evaluation on this med

## 2016-09-18 ENCOUNTER — Ambulatory Visit (INDEPENDENT_AMBULATORY_CARE_PROVIDER_SITE_OTHER): Payer: Commercial Managed Care - HMO | Admitting: Family Medicine

## 2016-09-18 VITALS — BP 134/82 | HR 97 | Temp 98.2°F | Ht 64.0 in | Wt 137.9 lb

## 2016-09-18 DIAGNOSIS — M19041 Primary osteoarthritis, right hand: Secondary | ICD-10-CM | POA: Diagnosis not present

## 2016-09-18 DIAGNOSIS — E039 Hypothyroidism, unspecified: Secondary | ICD-10-CM | POA: Diagnosis not present

## 2016-09-18 DIAGNOSIS — R300 Dysuria: Secondary | ICD-10-CM

## 2016-09-18 DIAGNOSIS — R5383 Other fatigue: Secondary | ICD-10-CM

## 2016-09-18 DIAGNOSIS — I10 Essential (primary) hypertension: Secondary | ICD-10-CM

## 2016-09-18 DIAGNOSIS — M19042 Primary osteoarthritis, left hand: Secondary | ICD-10-CM

## 2016-09-18 LAB — HEPATIC FUNCTION PANEL
ALK PHOS: 64 U/L (ref 39–117)
ALT: 15 U/L (ref 0–35)
AST: 14 U/L (ref 0–37)
Albumin: 4.2 g/dL (ref 3.5–5.2)
BILIRUBIN DIRECT: 0.1 mg/dL (ref 0.0–0.3)
BILIRUBIN TOTAL: 0.4 mg/dL (ref 0.2–1.2)
Total Protein: 6.4 g/dL (ref 6.0–8.3)

## 2016-09-18 LAB — POCT URINALYSIS DIPSTICK
Bilirubin, UA: NEGATIVE
Blood, UA: NEGATIVE
Glucose, UA: NEGATIVE
Ketones, UA: NEGATIVE
Nitrite, UA: NEGATIVE
PROTEIN UA: NEGATIVE
SPEC GRAV UA: 1.01
UROBILINOGEN UA: 0.2
pH, UA: 7

## 2016-09-18 LAB — CBC WITH DIFFERENTIAL/PLATELET
BASOS ABS: 0.1 10*3/uL (ref 0.0–0.1)
Basophils Relative: 1.4 % (ref 0.0–3.0)
EOS ABS: 0.2 10*3/uL (ref 0.0–0.7)
Eosinophils Relative: 3.5 % (ref 0.0–5.0)
HCT: 38.9 % (ref 36.0–46.0)
Hemoglobin: 12.9 g/dL (ref 12.0–15.0)
LYMPHS ABS: 1.5 10*3/uL (ref 0.7–4.0)
Lymphocytes Relative: 24.6 % (ref 12.0–46.0)
MCHC: 33.1 g/dL (ref 30.0–36.0)
MCV: 89.3 fl (ref 78.0–100.0)
MONO ABS: 0.5 10*3/uL (ref 0.1–1.0)
MONOS PCT: 7.7 % (ref 3.0–12.0)
NEUTROS ABS: 3.8 10*3/uL (ref 1.4–7.7)
NEUTROS PCT: 62.8 % (ref 43.0–77.0)
PLATELETS: 351 10*3/uL (ref 150.0–400.0)
RBC: 4.35 Mil/uL (ref 3.87–5.11)
RDW: 14.8 % (ref 11.5–15.5)
WBC: 6.1 10*3/uL (ref 4.0–10.5)

## 2016-09-18 LAB — BASIC METABOLIC PANEL
BUN: 13 mg/dL (ref 6–23)
CALCIUM: 9 mg/dL (ref 8.4–10.5)
CO2: 28 meq/L (ref 19–32)
CREATININE: 0.72 mg/dL (ref 0.40–1.20)
Chloride: 105 mEq/L (ref 96–112)
GFR: 84.93 mL/min (ref >60.00)
GLUCOSE: 100 mg/dL — AB (ref 70–99)
Potassium: 4.4 mEq/L (ref 3.5–5.1)
SODIUM: 141 meq/L (ref 135–145)

## 2016-09-18 LAB — TSH: TSH: 2.21 u[IU]/mL (ref 0.35–4.50)

## 2016-09-18 MED ORDER — CELECOXIB 200 MG PO CAPS: 200.0000 mg | ORAL_CAPSULE | Freq: Every day | ORAL | 1 refills | Status: DC

## 2016-09-18 MED ORDER — CEPHALEXIN 500 MG PO CAPS: 500.0000 mg | ORAL_CAPSULE | Freq: Three times a day (TID) | ORAL | 0 refills | Status: DC

## 2016-09-18 NOTE — Patient Instructions (Signed)

## 2016-09-18 NOTE — Progress Notes (Signed)
Pre visit review using our clinic review tool, if applicable. No additional management support is needed unless otherwise documented below in the visit note. 

## 2016-09-18 NOTE — Progress Notes (Signed)
Subjective:     Patient ID: Tonya Hamilton, female   DOB: 05/10/1946, 70 y.o.   MRN: [id number]  HPI Patient is here to discuss multiple issues as follows  Primary osteoarthritis of the hands. She's taken Tylenol in the past without success. She currently takes Advil about 3-4 per day. She specifically called requesting Celebrex. We will, we saw this listed as an allergy. However, patient states she took this years ago with great success with no side effects whatsoever. She denies any allergies. She states she had this added to the list with never some negative reports about possible association with Celebrex heart disease but again she never had any issues whatsoever. She has no history of sulfa allergy.  Dysuria past 1-2 days. History of frequent UTI in the past. Currently takes trimethoprim 100 mg once those for prevention. No fevers or chills. No gross hematuria. No flank pain.  Hypothyroidism. Levels have not been assessed in quite some time. She is compliant with low-dose levothyroxine. She does have some general fatigue issues. She has hypertension treated with amlodipine and blood pressure stable. No dizziness. No chest pains. No dyspnea. Appetite and weight are stable.  Past Medical History:  Diagnosis Date  . Arthritis   . Colon polyps   . Diverticulitis   . Hyperlipidemia   . Hypertension   . Hypothyroidism   . PONV (postoperative nausea and vomiting)   . Thyroid disease   . UTI (urinary tract infection)    Past Surgical History:  Procedure Laterality Date  . ABDOMINAL HYSTERECTOMY  1993   TAH, endometriosis  . CHOLECYSTECTOMY  2002  . TOTAL HIP ARTHROPLASTY Right 05/31/2013   Procedure: RIGHT TOTAL HIP ARTHROPLASTY ANTERIOR APPROACH;  Surgeon: Gearlean Alf, MD;  Location: [facility];  Service: Orthopedics;  Laterality: Right;    reports that she has never smoked. She has never used smokeless tobacco. She reports that she does not drink alcohol or use drugs. family history  includes Arthritis in her mother; Heart disease in her father and mother; Hypertension in her father and mother; Stroke in her father. Allergies  Allergen Reactions  . Diclofenac   . Lisinopril Swelling  . Simvastatin   . Statins     Body aches all over     Review of Systems  Constitutional: Positive for fatigue.  Eyes: Negative for visual disturbance.  Respiratory: Negative for cough, chest tightness, shortness of breath and wheezing.   Cardiovascular: Negative for chest pain, palpitations and leg swelling.  Genitourinary: Positive for dysuria.  Musculoskeletal: Positive for arthralgias.  Neurological: Negative for dizziness, seizures, syncope, weakness, light-headedness and headaches.       Objective:   Physical Exam  Constitutional: She appears well-developed and well-nourished.  Eyes: Pupils are equal, round, and reactive to light.  Neck: Neck supple. No JVD present. No thyromegaly present.  Cardiovascular: Normal rate and regular rhythm.  Exam reveals no gallop.   Pulmonary/Chest: Effort normal and breath sounds normal. No respiratory distress. She has no wheezes. She has no rales.  Musculoskeletal: She exhibits no edema.  She has significant degenerative changes of DIP and PIP joints of both hands  Neurological: She is alert.       Assessment:     #1 primary osteoarthritis of both hands. Poorly controlled with Advil and Tylenol  #2 hypertension stable and at goal  #3 dysuria with trace leukocytes on dipstick otherwise normal  #4 hypothyroidism    Plan:     -Check labs with TSH, CBC, basic  metabolic panel, hepatic panel -Urine culture sent -Start Keflex 500 mg 3 times a day for 5 days pending culture results -Start Celebrex 200 mg once daily. We reviewed potential risk of Celebrex. Avoid concomitant use of other non-steroidals  [name] MD [facility] at [facility]

## 2016-09-20 LAB — URINE CULTURE: ORGANISM ID, BACTERIA: NO GROWTH

## 2016-09-21 ENCOUNTER — Telehealth: Payer: Self-pay | Admitting: Family Medicine

## 2016-09-21 NOTE — Telephone Encounter (Signed)
Pt is aware of results. 

## 2016-09-21 NOTE — Telephone Encounter (Signed)
Pt would like results of labs. Please call back. °

## 2016-09-28 ENCOUNTER — Telehealth: Payer: Self-pay | Admitting: Family Medicine

## 2016-09-28 MED ORDER — CELECOXIB 200 MG PO CAPS: 200.0000 mg | ORAL_CAPSULE | Freq: Every day | ORAL | 1 refills | Status: DC

## 2016-09-28 MED ORDER — TRIMETHOPRIM 100 MG PO TABS: 100.0000 mg | ORAL_TABLET | Freq: Every day | ORAL | 1 refills | Status: DC

## 2016-09-28 MED ORDER — AMLODIPINE BESYLATE 5 MG PO TABS: 5.0000 mg | ORAL_TABLET | Freq: Every day | ORAL | 2 refills | Status: DC

## 2016-09-28 NOTE — Telephone Encounter (Signed)
Medication sent in for patient. 

## 2016-09-28 NOTE — Telephone Encounter (Signed)
° °  Pt request refill of the following:  trimethoprim (TRIMPEX) 100 MG tablet  amLODipine (NORVASC) 5 MG tablet   [facility]  [address]   celecoxib (CELEBREX) 200 MG capsule   Phamacy:  [facility]

## 2016-12-10 ENCOUNTER — Telehealth: Payer: Self-pay

## 2016-12-10 NOTE — Telephone Encounter (Signed)
Received PA request from [facility] for Celecoxib 200 mg capsules. PA submitted & is pending. Key:  Tonya Hamilton

## 2016-12-10 NOTE — Telephone Encounter (Signed)
Received form from insurance requesting more information. Form filled out & faxed back.

## 2016-12-11 NOTE — Telephone Encounter (Signed)
Please read message below. Thank you

## 2016-12-11 NOTE — Telephone Encounter (Signed)
I do not have a list of preferred drugs.

## 2016-12-11 NOTE — Telephone Encounter (Signed)
PA denied. The drug is not on the formulary and before it can be covered, the insurance needs to know why patient cannot take the preferred drugs.

## 2016-12-14 NOTE — Telephone Encounter (Signed)
Patient will have to call

## 2016-12-14 NOTE — Telephone Encounter (Signed)
Is there a way to get the preferred list or does the patient need to call?

## 2016-12-15 NOTE — Telephone Encounter (Signed)
Pt is aware to contact her insurance company--FYI.

## 2017-01-07 ENCOUNTER — Other Ambulatory Visit: Payer: Self-pay | Admitting: Family Medicine

## 2017-01-07 DIAGNOSIS — Z1231 Encounter for screening mammogram for malignant neoplasm of breast: Secondary | ICD-10-CM

## 2017-02-08 ENCOUNTER — Ambulatory Visit
Admission: RE | Admit: 2017-02-08 | Discharge: 2017-02-08 | Disposition: A | Discharge status: Home / Self Care | Payer: Medicare PPO | Source: Ambulatory Visit | Attending: Family Medicine | Admitting: Family Medicine

## 2017-02-08 DIAGNOSIS — Z1231 Encounter for screening mammogram for malignant neoplasm of breast: Secondary | ICD-10-CM

## 2017-03-18 ENCOUNTER — Other Ambulatory Visit: Payer: Self-pay | Admitting: Family Medicine

## 2017-03-22 ENCOUNTER — Telehealth: Payer: Self-pay | Admitting: *Deleted

## 2017-03-22 MED ORDER — TRIMETHOPRIM 100 MG PO TABS: 100.0000 mg | ORAL_TABLET | Freq: Every day | ORAL | 1 refills | Status: DC

## 2017-03-22 NOTE — Telephone Encounter (Signed)
trimethoprim (TRIMPEX) 100 MG tablet Last refill 09/28/16 and last office visit 09/18/16.  Okay to fill?

## 2017-03-22 NOTE — Telephone Encounter (Signed)
Refill for 6 months. 

## 2017-03-23 MED ORDER — TRIMETHOPRIM 100 MG PO TABS: 100.0000 mg | ORAL_TABLET | Freq: Every day | ORAL | 1 refills | Status: DC

## 2017-03-23 NOTE — Addendum Note (Signed)
Addended by: Westley Hummer B on: 03/23/2017 08:16 AM   Modules accepted: Orders

## 2017-04-17 ENCOUNTER — Other Ambulatory Visit: Payer: Self-pay | Admitting: Family Medicine

## 2017-04-20 ENCOUNTER — Telehealth: Payer: Self-pay | Admitting: Family Medicine

## 2017-04-20 NOTE — Telephone Encounter (Signed)
Called pt to advise dr Elease Hashimoto would prefer to see her. Pt states her back is really hurting and she does not feel well. Offered pt to see cory today at 5:15, pt declined and will wait for appointment in the am.

## 2017-04-20 NOTE — Telephone Encounter (Signed)
Pt was in the office yesterday with her husband.  Pt states she is having pain with urination and lower back pain.  Was hoping she could come in and give a urine.  Advised pt she would need to be seen.  Declined to see another provider today.  appt made for wed am. Would like the UA if possible

## 2017-04-21 ENCOUNTER — Ambulatory Visit (INDEPENDENT_AMBULATORY_CARE_PROVIDER_SITE_OTHER): Payer: Medicare PPO | Admitting: Family Medicine

## 2017-04-21 ENCOUNTER — Encounter: Payer: Self-pay | Admitting: Family Medicine

## 2017-04-21 DIAGNOSIS — R300 Dysuria: Secondary | ICD-10-CM

## 2017-04-21 LAB — POCT URINALYSIS DIPSTICK
Bilirubin, UA: NEGATIVE
Glucose, UA: NEGATIVE
Ketones, UA: NEGATIVE
NITRITE UA: NEGATIVE
PROTEIN UA: NEGATIVE
Spec Grav, UA: 1.015 (ref 1.010–1.025)
UROBILINOGEN UA: 0.2 U/dL
pH, UA: 6 (ref 5.0–8.0)

## 2017-04-21 MED ORDER — CEPHALEXIN 500 MG PO CAPS: 500.0000 mg | ORAL_CAPSULE | Freq: Three times a day (TID) | ORAL | 0 refills | Status: DC

## 2017-04-21 NOTE — Patient Instructions (Signed)
Stay well hydrated. Go ahead and start the antibiotic.

## 2017-04-21 NOTE — Progress Notes (Signed)
Subjective:     Patient ID: Tonya Hamilton, female   DOB: December 06, 1945, 71 y.o.   MRN: [id number]  HPI Patient seen with approximately one-week history of some dysuria. She has some frequency of urination and burning with urination. She's had frequent symptoms in the past with frequent UTIs. Last culture was last November and negative. She has no known antibiotic allergies. She has been taking trimethoprim 100 mg once daily for prevention. She has some bilateral lower lumbar pain. No gross hematuria. No documented fever. No nausea or vomiting.  Tried topical estrogen once previously for atrophic vaginitis but had severe burning symptoms  Past Medical History:  Diagnosis Date  . Arthritis   . Colon polyps   . Diverticulitis   . Hyperlipidemia   . Hypertension   . Hypothyroidism   . PONV (postoperative nausea and vomiting)   . Thyroid disease   . UTI (urinary tract infection)    Past Surgical History:  Procedure Laterality Date  . ABDOMINAL HYSTERECTOMY  1993   TAH, endometriosis  . CHOLECYSTECTOMY  2002  . TOTAL HIP ARTHROPLASTY Right 05/31/2013   Procedure: RIGHT TOTAL HIP ARTHROPLASTY ANTERIOR APPROACH;  Surgeon: Gearlean Alf, MD;  Location: [facility];  Service: Orthopedics;  Laterality: Right;    reports that she has never smoked. She has never used smokeless tobacco. She reports that she does not drink alcohol or use drugs. family history includes Arthritis in her mother; Heart disease in her father and mother; Hypertension in her father and mother; Stroke in her father. Allergies  Allergen Reactions  . Diclofenac   . Lisinopril Swelling  . Simvastatin   . Statins     Body aches all over     Review of Systems  Constitutional: Negative for chills and fever.  Genitourinary: Positive for dysuria. Negative for flank pain, hematuria, pelvic pain and vaginal discharge.       Objective:   Physical Exam  Constitutional: She appears well-developed and well-nourished.   Cardiovascular: Normal rate and regular rhythm.   Pulmonary/Chest: Effort normal and breath sounds normal. No respiratory distress. She has no wheezes. She has no rales.       Assessment:     Dysuria. Rule out UTI. Urine dipstick reveals trace leukocytes and positive blood    Plan:     -Urine culture sent -Start Keflex 500 mg 3 times daily for 7 days pending culture results -Increase hydration  [name] MD [facility] at [facility]

## 2017-04-24 LAB — URINE CULTURE

## 2017-04-27 ENCOUNTER — Telehealth: Payer: Self-pay | Admitting: Family Medicine

## 2017-04-27 NOTE — Telephone Encounter (Signed)
Left message on machine for patient to return our call 

## 2017-04-27 NOTE — Telephone Encounter (Signed)
Tonya Hamilton pt returning your call

## 2017-04-28 ENCOUNTER — Other Ambulatory Visit: Payer: Self-pay | Admitting: Family Medicine

## 2017-04-28 MED ORDER — CIPROFLOXACIN HCL 500 MG PO TABS: 500.0000 mg | ORAL_TABLET | Freq: Two times a day (BID) | ORAL | 0 refills | Status: DC

## 2017-04-28 NOTE — Telephone Encounter (Signed)
Patient is aware of lab result. 

## 2017-05-20 ENCOUNTER — Other Ambulatory Visit: Payer: Self-pay | Admitting: Family Medicine

## 2017-06-14 ENCOUNTER — Ambulatory Visit (INDEPENDENT_AMBULATORY_CARE_PROVIDER_SITE_OTHER): Payer: Medicare PPO | Admitting: Family Medicine

## 2017-06-14 ENCOUNTER — Encounter: Payer: Self-pay | Admitting: Family Medicine

## 2017-06-14 VITALS — BP 130/80 | HR 88 | Temp 98.2°F | Wt 135.4 lb

## 2017-06-14 DIAGNOSIS — R300 Dysuria: Secondary | ICD-10-CM | POA: Diagnosis not present

## 2017-06-14 DIAGNOSIS — H6121 Impacted cerumen, right ear: Secondary | ICD-10-CM

## 2017-06-14 LAB — POCT URINALYSIS DIPSTICK
Bilirubin, UA: NEGATIVE
Blood, UA: NEGATIVE
GLUCOSE UA: NEGATIVE
Ketones, UA: NEGATIVE
LEUKOCYTES UA: NEGATIVE
Nitrite, UA: NEGATIVE
PROTEIN UA: NEGATIVE
Spec Grav, UA: 1.01 (ref 1.010–1.025)
UROBILINOGEN UA: 0.2 U/dL
pH, UA: 6 (ref 5.0–8.0)

## 2017-06-14 NOTE — Progress Notes (Addendum)
Subjective:     Patient ID: Tonya Hamilton, female   DOB: [birth year], 71 y.o.   MRN: [id number]  HPI Patient seen as a work in with one-day history of some urine frequency and burning with urination. She's had some diffuse mild headaches which are typical of frequent UTIs in the past. No fever. No flank pain. No gross hematuria. She had urine infection back in June with Klebsiella. She's been taking prevention with trimethoprim 100 mg once daily. Denies any vaginal discharge. No vaginal drying.  Also complains of some fullness right ear.  No drainage.  No hearing changes.  Past Medical History:  Diagnosis Date  . Arthritis   . Colon polyps   . Diverticulitis   . Hyperlipidemia   . Hypertension   . Hypothyroidism   . PONV (postoperative nausea and vomiting)   . Thyroid disease   . UTI (urinary tract infection)    Past Surgical History:  Procedure Laterality Date  . ABDOMINAL HYSTERECTOMY  1993   TAH, endometriosis  . CHOLECYSTECTOMY  2002  . TOTAL HIP ARTHROPLASTY Right 05/31/2013   Procedure: RIGHT TOTAL HIP ARTHROPLASTY ANTERIOR APPROACH;  Surgeon: Gearlean Alf, MD;  Location: [facility];  Service: Orthopedics;  Laterality: Right;    reports that she has never smoked. She has never used smokeless tobacco. She reports that she does not drink alcohol or use drugs. family history includes Arthritis in her mother; Heart disease in her father and mother; Hypertension in her father and mother; Stroke in her father. Allergies  Allergen Reactions  . Diclofenac   . Lisinopril Swelling  . Simvastatin   . Statins     Body aches all over     Review of Systems  Constitutional: Negative for chills and fever.  Genitourinary: Positive for dysuria and frequency. Negative for difficulty urinating, flank pain and hematuria.       Objective:   Physical Exam  Constitutional: She appears well-developed and well-nourished.  HENT:  Cerumen impaction right canal.  Left clear.  Cardiovascular:  Normal rate and regular rhythm.   Pulmonary/Chest: Effort normal and breath sounds normal. No respiratory distress. She has no wheezes. She has no rales.  Neurological: No cranial nerve deficit.       Assessment:     #1 Dysuria. Urine dipstick is normal.  #2 cerumen impaction right canal-cleared with curette.    Plan:     -We elected to go ahead and get urine culture as she has classic symptoms -We decided not to start any new antibiotics pending culture results but follow-up promptly for any fever, gross hematuria, or other concerns  [name] MD [facility] at [facility]

## 2017-06-14 NOTE — Patient Instructions (Signed)
We will call you with the culture results Stay well hydrated.

## 2017-06-15 ENCOUNTER — Other Ambulatory Visit: Payer: Self-pay | Admitting: Family Medicine

## 2017-06-15 ENCOUNTER — Ambulatory Visit: Payer: Medicare PPO | Admitting: Family Medicine

## 2017-06-15 LAB — URINE CULTURE: ORGANISM ID, BACTERIA: NO GROWTH

## 2017-07-19 ENCOUNTER — Other Ambulatory Visit: Payer: Self-pay | Admitting: Family Medicine

## 2017-09-16 NOTE — Progress Notes (Addendum)
Subjective:   Tonya Hamilton is a 71 y.o. female who presents for Medicare Annual (Subsequent) preventive examination.  The Patient was informed that the wellness visit is to identify future health risk and educate and initiate measures that can reduce risk for increased disease through the lifespan.    Annual Wellness Assessment  Reports health as fine But spouse has bladder cancer dx this past year  Will go back Dec 17th and more tx expected  Had just moved to townhome when dx.   Dtr got  married in [place]  Just got a new puppy / Bella; part sheep dog and poodle   [place] from [place]  [place]; [place]  Has upstairs and bedroom downstairs  Walk in shower  [place] from [place] in 2005  Dtr lives in [place] / son in [place] -Counseling & Management  Medicare Annual Preventive Care Visit - Subsequent Last OV 06/2017  Health Maintenance Due  Topic Date Due  . Hepatitis C Screening  [birth year]  . PNA vac Low Risk Adult (2 of 2 - PPSV23) 05/01/2017  . INFLUENZA VACCINE  06/02/2017   Agreed to Hep c at the next blood draw Colonoscopy 11/2007 due 11/2017 Mammogram 01/2017  Dexa 09/2012 lFN -1.9  Re-schedule Dexa was done at [facility]  Also referred to the [facility] osteoporosis [facility] for more information on treatment if needed in the future   Will take psv 23 today  VS reviewed;   Diet  Breakfast - cheerios; go out for breakfast  Lunch; Sandwich;  Supper good supper ; balanced meal Meat with vegetables or salad   BMI 23   Exercise Walks with the new dog Up and moving all the time  Is constantly moving   Hearing Screening Comments: No hearing issues  Vision Screening Comments: Vision is good Just was seen recently  Dr. Macarthur Critchley     Cardiac Risk Factors Addressed Family hx HTN, HD Stroke  Hyperlipidemia Diabetes - glucose 100   Advanced Directives completed   Patient Care Team: [name], MD as PCP - General (Family Medicine)   Cardiac Risk Factors include: advanced age (>31men, >47 women);hypertension    Objective:     Vitals: BP 120/70   Pulse 75   Ht 5\' 4"  (1.626 m)   Wt 135 lb 5 oz (61.4 kg)   SpO2 98%   BMI 23.23 kg/m   Body mass index is 23.23 kg/m.   Tobacco Social History   Tobacco Use  Smoking Status Never Smoker  Smokeless Tobacco Never Used     Counseling given: Yes   Past Medical History:  Diagnosis Date  . Arthritis   . Colon polyps   . Diverticulitis   . Hyperlipidemia   . Hypertension   . Hypothyroidism   . PONV (postoperative nausea and vomiting)   . Thyroid disease   . UTI (urinary tract infection)    Past Surgical History:  Procedure Laterality Date  . ABDOMINAL HYSTERECTOMY  1993   TAH, endometriosis  . CHOLECYSTECTOMY  2002  . RIGHT TOTAL HIP ARTHROPLASTY ANTERIOR APPROACH Right 05/31/2013   Performed by Gearlean Alf, MD at [facility]   Family History  Problem Relation Age of Onset  . Arthritis Mother   . Hypertension Mother   . Heart disease Mother   . Hypertension Father   . Stroke Father   . Heart disease Father    Social History  Substance and Sexual Activity  Sexual Activity Not on file    Outpatient Encounter Medications as of 09/17/2017  Medication Sig  . amLODipine (NORVASC) 5 MG tablet TAKE ONE TABLET BY MOUTH DAILY  . celecoxib (CELEBREX) 200 MG capsule TAKE 1 CAPSULE(200 MG) BY MOUTH DAILY  . levothyroxine (SYNTHROID, LEVOTHROID) 25 MCG tablet TAKE ONE TABLET BY MOUTH DAILY  . omeprazole (PRILOSEC) 20 MG capsule Take 20 mg by mouth daily.   . phenazopyridine (PYRIDIUM) 200 MG tablet Take 1 tablet (200 mg total) by mouth 3 (three) times daily as needed for pain.  Marland Kitchen trimethoprim (TRIMPEX) 100 MG tablet TAKE 1 TABLET(100 MG) BY MOUTH DAILY   No facility-administered encounter medications on file as of 09/17/2017.     Activities of Daily Living In your present state of health, do you have any  difficulty performing the following activities: 09/17/2017  Hearing? N  Vision? N  Difficulty concentrating or making decisions? N  Walking or climbing stairs? N  Dressing or bathing? N  Doing errands, shopping? N  Preparing Food and eating ? N  Using the Toilet? N  In the past six months, have you accidently leaked urine? N  Do you have problems with loss of bowel control? N  Managing your Medications? N  Managing your Finances? N  Housekeeping or managing your Housekeeping? N  Some recent data might be hidden    Patient Care Team: [name], MD as PCP - General (Family Medicine)    Assessment:     Exercise Activities and Dietary recommendations Current Exercise Habits: Home exercise routine, Type of exercise: walking;strength training/weights;Other - see comments(house keeping, just moved, extremely active ), Frequency (Times/Week): 6, Intensity: Moderate  Goals    None     Fall Risk Fall Risk  09/17/2017 04/07/2016 03/08/2015 10/23/2014 09/05/2013  Falls in the past year? No No No No No   Depression Screen PHQ 2/9 Scores 09/17/2017 04/07/2016 03/08/2015 10/23/2014  PHQ - 2 Score 0 0 0 0     Cognitive Function MMSE - Mini Mental State Exam 09/17/2017  Not completed: (No Data)     Ad8 score reviewed for issues:  Issues making decisions:  Less interest in hobbies / activities:  Repeats questions, stories (family complaining):  Trouble using ordinary gadgets (microwave, computer, phone):  Forgets the month or year:   Mismanaging finances:   Remembering appts:  Daily problems with thinking and/or memory: Ad8 score is=0    Immunization History  Administered Date(s) Administered  . Influenza Split 08/15/2012  . Influenza, High Dose Seasonal PF 09/19/2014, 07/01/2016  . Influenza-Unspecified 08/31/2017  . Pneumococcal Conjugate-13 05/01/2016  . Pneumococcal Polysaccharide-23 09/17/2017  . Tdap 11/15/2008   Screening Tests Health Maintenance  Topic  Date Due  . Hepatitis C Screening  1946/06/07  . PNA vac Low Risk Adult (2 of 2 - PPSV23) 05/01/2017  . INFLUENZA VACCINE  06/02/2017  . COLONOSCOPY  11/15/2017  . TETANUS/TDAP  11/15/2018  . MAMMOGRAM  02/09/2019  . DEXA SCAN  Completed      Plan:     PCP Notes  Health Maintenance Educated on hep c screening- will have at her next blood draw  Will repeat dexa to track any further osteopenia;  Last dexa was 09/2012  Mammogram annually  And completed this year  Had flu vaccine at pharmacy  Agreed to PSV 23 today    Abnormal Screens  No   Referrals  None   Patient concerns; Was curious as to her  lipids. Requested she make apt with Dr. Elease Hashimoto to review annual labs;  Also c/o of being "fatigued" but agreed it has been overwhelming year with move; spouse dx bladder cancer and recent marriage of her dtr.  Was afraid her fatigue may be due to UTI which she has often; POC ur checked today and was neg    Nurse Concerns; As noted  Next PCP apt 12/12 2018 made today for medical fup of blood work (lipids per her request) and other    I have personally reviewed and noted the following in the patient's chart:   . Medical and social history . Use of alcohol, tobacco or illicit drugs  . Current medications and supplements . Functional ability and status . Nutritional status . Physical activity . Advanced directives . List of other physicians . Hospitalizations, surgeries, and ER visits in previous 12 months . Vitals . Screenings to include cognitive, depression, and falls . Referrals and appointments  In addition, I have reviewed and discussed with patient certain preventive protocols, quality metrics, and best practice recommendations. A written personalized care plan for preventive services as well as general preventive health recommendations were provided to patient.     AXENM,MHWKG, RN  09/17/2017  Above reviewed and agree  [name] MD [place]  Primary Care at [facility]

## 2017-09-17 ENCOUNTER — Ambulatory Visit (INDEPENDENT_AMBULATORY_CARE_PROVIDER_SITE_OTHER): Payer: Medicare PPO

## 2017-09-17 VITALS — BP 120/70 | HR 75 | Ht 64.0 in | Wt 135.3 lb

## 2017-09-17 DIAGNOSIS — Z23 Encounter for immunization: Secondary | ICD-10-CM | POA: Diagnosis not present

## 2017-09-17 DIAGNOSIS — Z1159 Encounter for screening for other viral diseases: Secondary | ICD-10-CM

## 2017-09-17 DIAGNOSIS — R5383 Other fatigue: Secondary | ICD-10-CM | POA: Diagnosis not present

## 2017-09-17 DIAGNOSIS — E2839 Other primary ovarian failure: Secondary | ICD-10-CM

## 2017-09-17 DIAGNOSIS — Z0000 Encounter for general adult medical examination without abnormal findings: Secondary | ICD-10-CM

## 2017-09-17 LAB — POCT URINALYSIS DIPSTICK
BILIRUBIN UA: NEGATIVE
Blood, UA: NEGATIVE
Clarity, UA: NEGATIVE
Color, UA: NEGATIVE
GLUCOSE UA: NEGATIVE
Ketones, UA: NEGATIVE
LEUKOCYTES UA: NEGATIVE
NITRITE UA: NEGATIVE
PH UA: 5 (ref 5.0–8.0)
Protein, UA: NEGATIVE
Spec Grav, UA: 1.025 (ref 1.010–1.025)
UROBILINOGEN UA: NEGATIVE U/dL — AB

## 2017-09-17 NOTE — Patient Instructions (Addendum)
Tonya Hamilton , Thank you for taking time to come for your Medicare Wellness Visit. I appreciate your ongoing commitment to your health goals. Please review the following plan we discussed and let me know if I can assist you in the future.   Will make apt with Dr. Elease Hashimoto for your annual medical review  Will order DEXA scan and will give you 6 months to get it   [employer]   Consider pedometer to track your steps   Will check on Urine  Will check on lipid draw    These are the goals we discussed:but continue to MOVE; continue to walk the dogs Goals    None      This is a list of the screening recommended for you and due dates:  Health Maintenance  Topic Date Due  .  Hepatitis C: One time screening is recommended by Center for Disease Control  (CDC) for  adults born from [birth year] through 1965.   71 25, 1947  . Pneumonia vaccines (2 of 2 - PPSV23) 71/30/2018  . Flu Shot  71/11/2016  . Colon Cancer Screening  71/14/2019  . Tetanus Vaccine  71/14/2020  . Mammogram  71/07/2019  . DEXA scan (bone density measurement)  Completed     Bone Densitometry Bone densitometry is an imaging test that uses a special X-ray to measure the amount of calcium and other minerals in your bones (bone density). This test is also known as a bone mineral density test or dual-energy X-ray absorptiometry (DXA). The test can measure bone density at your hip and your spine. It is similar to having a regular X-ray. You may have this test to:  Diagnose a condition that causes weak or thin bones (osteoporosis).  Predict your risk of a broken bone (fracture).  Determine how well osteoporosis treatment is working.  Tell a health care provider about:  Any allergies you have.  All medicines you are taking, including vitamins, herbs, eye drops, creams, and over-the-counter medicines.  Any problems you or family members have had with anesthetic medicines.  Any blood disorders you  have.  Any surgeries you have had.  Any medical conditions you have.  Possibility of pregnancy.  Any other medical test you had within the previous 14 days that used contrast material. What are the risks? Generally, this is a safe procedure. However, problems can occur and may include the following:  This test exposes you to a very small amount of radiation.  The risks of radiation exposure may be greater to unborn children.  What happens before the procedure?  Do not take any calcium supplements for 24 hours before having the test. You can otherwise eat and drink what you usually do.  Take off all metal jewelry, eyeglasses, dental appliances, and any other metal objects. What happens during the procedure?  You may lie on an exam table. There will be an X-ray generator below you and an imaging device above you.  Other devices, such as boxes or braces, may be used to position your body properly for the scan.  You will need to lie still while the machine slowly scans your body.  The images will show up on a computer monitor. What happens after the procedure? You may need more testing at a later time. This information is not intended to replace advice given to you by your health care provider. Make sure you discuss any questions you have with your health care provider. Document Released: 11/10/2004 Document Revised: 03/26/2016 Document Reviewed:  03/29/2014 Elsevier Interactive Patient Education  2018 [employer] Prevention in the Home Falls can cause injuries. They can happen to people of all ages. There are many things you can do to make your home safe and to help prevent falls. What can I do on the outside of my home?  Regularly fix the edges of walkways and driveways and fix any cracks.  Remove anything that might make you trip as you walk through a door, such as a raised step or threshold.  Trim any bushes or trees on the path to your home.  Use bright  outdoor lighting.  Clear any walking paths of anything that might make someone trip, such as rocks or tools.  Regularly check to see if handrails are loose or broken. Make sure that both sides of any steps have handrails.  Any raised decks and porches should have guardrails on the edges.  Have any leaves, snow, or ice cleared regularly.  Use sand or salt on walking paths during winter.  Clean up any spills in your garage right away. This includes oil or grease spills. What can I do in the bathroom?  Use night lights.  Install grab bars by the toilet and in the tub and shower. Do not use towel bars as grab bars.  Use non-skid mats or decals in the tub or shower.  If you need to sit down in the shower, use a plastic, non-slip stool.  Keep the floor dry. Clean up any water that spills on the floor as soon as it happens.  Remove soap buildup in the tub or shower regularly.  Attach bath mats securely with double-sided non-slip rug tape.  Do not have throw rugs and other things on the floor that can make you trip. What can I do in the bedroom?  Use night lights.  Make sure that you have a light by your bed that is easy to reach.  Do not use any sheets or blankets that are too big for your bed. They should not hang down onto the floor.  Have a firm chair that has side arms. You can use this for support while you get dressed.  Do not have throw rugs and other things on the floor that can make you trip. What can I do in the kitchen?  Clean up any spills right away.  Avoid walking on wet floors.  Keep items that you use a lot in easy-to-reach places.  If you need to reach something above you, use a strong step stool that has a grab bar.  Keep electrical cords out of the way.  Do not use floor polish or wax that makes floors slippery. If you must use wax, use non-skid floor wax.  Do not have throw rugs and other things on the floor that can make you trip. What can I do  with my stairs?  Do not leave any items on the stairs.  Make sure that there are handrails on both sides of the stairs and use them. Fix handrails that are broken or loose. Make sure that handrails are as long as the stairways.  Check any carpeting to make sure that it is firmly attached to the stairs. Fix any carpet that is loose or worn.  Avoid having throw rugs at the top or bottom of the stairs. If you do have throw rugs, attach them to the floor with carpet tape.  Make sure that you have a light switch at the top  of the stairs and the bottom of the stairs. If you do not have them, ask someone to add them for you. What else can I do to help prevent falls?  Wear shoes that: ? Do not have high heels. ? Have rubber bottoms. ? Are comfortable and fit you well. ? Are closed at the toe. Do not wear sandals.  If you use a stepladder: ? Make sure that it is fully opened. Do not climb a closed stepladder. ? Make sure that both sides of the stepladder are locked into place. ? Ask someone to hold it for you, if possible.  Clearly mark and make sure that you can see: ? Any grab bars or handrails. ? First and last steps. ? Where the edge of each step is.  Use tools that help you move around (mobility aids) if they are needed. These include: ? Canes. ? Walkers. ? Scooters. ? Crutches.  Turn on the lights when you go into a dark area. Replace any light bulbs as soon as they burn out.  Set up your furniture so you have a clear path. Avoid moving your furniture around.  If any of your floors are uneven, fix them.  If there are any pets around you, be aware of where they are.  Review your medicines with your doctor. Some medicines can make you feel dizzy. This can increase your chance of falling. Ask your doctor what other things that you can do to help prevent falls. This information is not intended to replace advice given to you by your health care provider. Make sure you discuss any  questions you have with your health care provider. Document Released: 08/15/2009 Document Revised: 03/26/2016 Document Reviewed: 11/23/2014 Elsevier Interactive Patient Education  2018 [place] Maintenance, Female Adopting a healthy lifestyle and getting preventive care can go a long way to promote health and wellness. Talk with your health care provider about what schedule of regular examinations is right for you. This is a good chance for you to check in with your provider about disease prevention and staying healthy. In between checkups, there are plenty of things you can do on your own. Experts have done a lot of research about which lifestyle changes and preventive measures are most likely to keep you healthy. Ask your health care provider for more information. Weight and diet Eat a healthy diet  Be sure to include plenty of vegetables, fruits, low-fat dairy products, and lean protein.  Do not eat a lot of foods high in solid fats, added sugars, or salt.  Get regular exercise. This is one of the most important things you can do for your health. ? Most adults should exercise for at least 150 minutes each week. The exercise should increase your heart rate and make you sweat (moderate-intensity exercise). ? Most adults should also do strengthening exercises at least twice a week. This is in addition to the moderate-intensity exercise.  Maintain a healthy weight  Body mass index (BMI) is a measurement that can be used to identify possible weight problems. It estimates body fat based on height and weight. Your health care provider can help determine your BMI and help you achieve or maintain a healthy weight.  For females 71 years of age and older: ? A BMI below 18.5 is considered underweight. ? A BMI of 18.5 to 24.9 is normal. ? A BMI of 25 to 29.9 is considered overweight. ? A BMI of 30 and above is considered obese.  Watch levels of cholesterol and blood lipids  You  should start having your blood tested for lipids and cholesterol at 71 years of age, then have this test every 5 years.  You may need to have your cholesterol levels checked more often if: ? Your lipid or cholesterol levels are high. ? You are older than 71 years of age. ? You are at high risk for heart disease.  Cancer screening Lung Cancer  Lung cancer screening is recommended for adults 6-11 years old who are at high risk for lung cancer because of a history of smoking.  A yearly low-dose CT scan of the lungs is recommended for people who: ? Currently smoke. ? Have quit within the past 15 years. ? Have at least a 30-pack-year history of smoking. A pack year is smoking an average of one pack of cigarettes a day for 1 year.  Yearly screening should continue until it has been 15 years since you quit.  Yearly screening should stop if you develop a health problem that would prevent you from having lung cancer treatment.  Breast Cancer  Practice breast self-awareness. This means understanding how your breasts normally appear and feel.  It also means doing regular breast self-exams. Let your health care provider know about any changes, no matter how small.  If you are in your 20s or 30s, you should have a clinical breast exam (CBE) by a health care provider every 1-3 years as part of a regular health exam.  If you are 77 or older, have a CBE every year. Also consider having a breast X-ray (mammogram) every year.  If you have a family history of breast cancer, talk to your health care provider about genetic screening.  If you are at high risk for breast cancer, talk to your health care provider about having an MRI and a mammogram every year.  Breast cancer gene (BRCA) assessment is recommended for women who have family members with BRCA-related cancers. BRCA-related cancers include: ? Breast. ? Ovarian. ? Tubal. ? Peritoneal cancers.  Results of the assessment will determine the  need for genetic counseling and BRCA1 and BRCA2 testing.  Cervical Cancer Your health care provider may recommend that you be screened regularly for cancer of the pelvic organs (ovaries, uterus, and vagina). This screening involves a pelvic examination, including checking for microscopic changes to the surface of your cervix (Pap test). You may be encouraged to have this screening done every 3 years, beginning at age 64.  For women ages 12-65, health care providers may recommend pelvic exams and Pap testing every 3 years, or they may recommend the Pap and pelvic exam, combined with testing for human papilloma virus (HPV), every 5 years. Some types of HPV increase your risk of cervical cancer. Testing for HPV may also be done on women of any age with unclear Pap test results.  Other health care providers may not recommend any screening for nonpregnant women who are considered low risk for pelvic cancer and who do not have symptoms. Ask your health care provider if a screening pelvic exam is right for you.  If you have had past treatment for cervical cancer or a condition that could lead to cancer, you need Pap tests and screening for cancer for at least 20 years after your treatment. If Pap tests have been discontinued, your risk factors (such as having a new sexual partner) need to be reassessed to determine if screening should resume. Some women have medical problems that increase  the chance of getting cervical cancer. In these cases, your health care provider may recommend more frequent screening and Pap tests.  Colorectal Cancer  This type of cancer can be detected and often prevented.  Routine colorectal cancer screening usually begins at 71 years of age and continues through 71 years of age.  Your health care provider may recommend screening at an earlier age if you have risk factors for colon cancer.  Your health care provider may also recommend using home test kits to check for hidden blood  in the stool.  A small camera at the end of a tube can be used to examine your colon directly (sigmoidoscopy or colonoscopy). This is done to check for the earliest forms of colorectal cancer.  Routine screening usually begins at age 29.  Direct examination of the colon should be repeated every 5-10 years through 71 years of age. However, you may need to be screened more often if early forms of precancerous polyps or small growths are found.  Skin Cancer  Check your skin from head to toe regularly.  Tell your health care provider about any new moles or changes in moles, especially if there is a change in a mole's shape or color.  Also tell your health care provider if you have a mole that is larger than the size of a pencil eraser.  Always use sunscreen. Apply sunscreen liberally and repeatedly throughout the day.  Protect yourself by wearing long sleeves, pants, a wide-brimmed hat, and sunglasses whenever you are outside.  Heart disease, diabetes, and high blood pressure  High blood pressure causes heart disease and increases the risk of stroke. High blood pressure is more likely to develop in: ? People who have blood pressure in the high end of the normal range (130-139/85-89 mm Hg). ? People who are overweight or obese. ? People who are African American.  If you are 5-40 years of age, have your blood pressure checked every 3-5 years. If you are 100 years of age or older, have your blood pressure checked every year. You should have your blood pressure measured twice-once when you are at a hospital or clinic, and once when you are not at a hospital or clinic. Record the average of the two measurements. To check your blood pressure when you are not at a hospital or clinic, you can use: ? An automated blood pressure machine at a pharmacy. ? A home blood pressure monitor.  If you are between 19 years and 70 years old, ask your health care provider if you should take aspirin to prevent  strokes.  Have regular diabetes screenings. This involves taking a blood sample to check your fasting blood sugar level. ? If you are at a normal weight and have a low risk for diabetes, have this test once every three years after 70 years of age. ? If you are overweight and have a high risk for diabetes, consider being tested at a younger age or more often. Preventing infection Hepatitis B  If you have a higher risk for hepatitis B, you should be screened for this virus. You are considered at high risk for hepatitis B if: ? You were born in a country where hepatitis B is common. Ask your health care provider which countries are considered high risk. ? Your parents were born in a high-risk country, and you have not been immunized against hepatitis B (hepatitis B vaccine). ? You have HIV or AIDS. ? You use needles to inject street  drugs. ? You live with someone who has hepatitis B. ? You have had sex with someone who has hepatitis B. ? You get hemodialysis treatment. ? You take certain medicines for conditions, including cancer, organ transplantation, and autoimmune conditions.  Hepatitis C  Blood testing is recommended for: ? Everyone born from [birth year] through 1965. ? Anyone with known risk factors for hepatitis C.  Sexually transmitted infections (STIs)  You should be screened for sexually transmitted infections (STIs) including gonorrhea and chlamydia if: ? You are sexually active and are younger than 71 years of age. ? You are older than 71 years of age and your health care provider tells you that you are at risk for this type of infection. ? Your sexual activity has changed since you were last screened and you are at an increased risk for chlamydia or gonorrhea. Ask your health care provider if you are at risk.  If you do not have HIV, but are at risk, it may be recommended that you take a prescription medicine daily to prevent HIV infection. This is called pre-exposure prophylaxis  (PrEP). You are considered at risk if: ? You are sexually active and do not regularly use condoms or know the HIV status of your partner(s). ? You take drugs by injection. ? You are sexually active with a partner who has HIV.  Talk with your health care provider about whether you are at high risk of being infected with HIV. If you choose to begin PrEP, you should first be tested for HIV. You should then be tested every 3 months for as long as you are taking PrEP. Pregnancy  If you are premenopausal and you may become pregnant, ask your health care provider about preconception counseling.  If you may become pregnant, take 400 to 800 micrograms (mcg) of folic acid every day.  If you want to prevent pregnancy, talk to your health care provider about birth control (contraception). Osteoporosis and menopause  Osteoporosis is a disease in which the bones lose minerals and strength with aging. This can result in serious bone fractures. Your risk for osteoporosis can be identified using a bone density scan.  If you are 33 years of age or older, or if you are at risk for osteoporosis and fractures, ask your health care provider if you should be screened.  Ask your health care provider whether you should take a calcium or vitamin D supplement to lower your risk for osteoporosis.  Menopause may have certain physical symptoms and risks.  Hormone replacement therapy may reduce some of these symptoms and risks. Talk to your health care provider about whether hormone replacement therapy is right for you. Follow these instructions at home:  Schedule regular health, dental, and eye exams.  Stay current with your immunizations.  Do not use any tobacco products including cigarettes, chewing tobacco, or electronic cigarettes.  If you are pregnant, do not drink alcohol.  If you are breastfeeding, limit how much and how often you drink alcohol.  Limit alcohol intake to no more than 1 drink per day for  nonpregnant women. One drink equals 12 ounces of beer, 5 ounces of wine, or 1 ounces of hard liquor.  Do not use street drugs.  Do not share needles.  Ask your health care provider for help if you need support or information about quitting drugs.  Tell your health care provider if you often feel depressed.  Tell your health care provider if you have ever been abused or do not  feel safe at home. This information is not intended to replace advice given to you by your health care provider. Make sure you discuss any questions you have with your health care provider. Document Released: 05/04/2011 Document Revised: 03/26/2016 Document Reviewed: 07/23/2015 Elsevier Interactive Patient Education  [employer].

## 2017-10-05 ENCOUNTER — Other Ambulatory Visit: Payer: Self-pay | Admitting: Family Medicine

## 2017-10-13 ENCOUNTER — Encounter: Payer: Medicare PPO | Admitting: Family Medicine

## 2017-10-19 ENCOUNTER — Other Ambulatory Visit: Payer: Self-pay | Admitting: Family Medicine

## 2017-10-28 ENCOUNTER — Ambulatory Visit (INDEPENDENT_AMBULATORY_CARE_PROVIDER_SITE_OTHER): Payer: Medicare PPO | Admitting: Family Medicine

## 2017-10-28 ENCOUNTER — Encounter: Payer: Self-pay | Admitting: Family Medicine

## 2017-10-28 VITALS — BP 128/70 | HR 77 | Temp 98.6°F | Ht 64.5 in | Wt 131.8 lb

## 2017-10-28 DIAGNOSIS — Z0000 Encounter for general adult medical examination without abnormal findings: Secondary | ICD-10-CM | POA: Diagnosis not present

## 2017-10-28 DIAGNOSIS — Z1331 Encounter for screening for depression: Secondary | ICD-10-CM

## 2017-10-28 LAB — LIPID PANEL
CHOL/HDL RATIO: 5
Cholesterol: 190 mg/dL (ref 0–200)
HDL: 39 mg/dL — AB (ref >39.00)
LDL CALC: 131 mg/dL — AB (ref 0–99)
NonHDL: 151.3
TRIGLYCERIDES: 101 mg/dL (ref 0.0–149.0)
VLDL: 20.2 mg/dL (ref 0.0–40.0)

## 2017-10-28 LAB — BASIC METABOLIC PANEL
BUN: 12 mg/dL (ref 6–23)
CHLORIDE: 104 meq/L (ref 96–112)
CO2: 31 meq/L (ref 19–32)
CREATININE: 0.78 mg/dL (ref 0.40–1.20)
Calcium: 9 mg/dL (ref 8.4–10.5)
GFR: 77.19 mL/min (ref >60.00)
Glucose, Bld: 96 mg/dL (ref 70–99)
POTASSIUM: 4 meq/L (ref 3.5–5.1)
Sodium: 143 mEq/L (ref 135–145)

## 2017-10-28 LAB — CBC WITH DIFFERENTIAL/PLATELET
BASOS ABS: 0.1 10*3/uL (ref 0.0–0.1)
Basophils Relative: 0.9 % (ref 0.0–3.0)
EOS ABS: 0.3 10*3/uL (ref 0.0–0.7)
Eosinophils Relative: 3.2 % (ref 0.0–5.0)
HCT: 39.2 % (ref 36.0–46.0)
Hemoglobin: 13 g/dL (ref 12.0–15.0)
LYMPHS ABS: 1.3 10*3/uL (ref 0.7–4.0)
LYMPHS PCT: 13.4 % (ref 12.0–46.0)
MCHC: 33.1 g/dL (ref 30.0–36.0)
MCV: 90.1 fl (ref 78.0–100.0)
MONO ABS: 0.7 10*3/uL (ref 0.1–1.0)
Monocytes Relative: 7 % (ref 3.0–12.0)
NEUTROS ABS: 7.1 10*3/uL (ref 1.4–7.7)
NEUTROS PCT: 75.5 % (ref 43.0–77.0)
PLATELETS: 400 10*3/uL (ref 150.0–400.0)
RBC: 4.36 Mil/uL (ref 3.87–5.11)
RDW: 14 % (ref 11.5–15.5)
WBC: 9.4 10*3/uL (ref 4.0–10.5)

## 2017-10-28 LAB — HEPATIC FUNCTION PANEL
ALT: 17 U/L (ref 0–35)
AST: 13 U/L (ref 0–37)
Albumin: 4.1 g/dL (ref 3.5–5.2)
Alkaline Phosphatase: 71 U/L (ref 39–117)
BILIRUBIN DIRECT: 0.1 mg/dL (ref 0.0–0.3)
BILIRUBIN TOTAL: 0.5 mg/dL (ref 0.2–1.2)
Total Protein: 6.6 g/dL (ref 6.0–8.3)

## 2017-10-28 LAB — TSH: TSH: 3.51 u[IU]/mL (ref 0.35–4.50)

## 2017-10-28 NOTE — Progress Notes (Signed)
Subjective:     Patient ID: Tonya Hamilton, female   DOB: 03/20/1946, 71 y.o.   MRN: [id number]  HPI Patient is here for physical exam. Her chronic problems include history of hypertension, GERD, hypothyroidism, osteoarthritis, recurrent UTIs. She is currently doing well on trimethoprim for prevention.  She had recent Pneumovax in October and apparently had some local reaction. She is due for repeat colonoscopy in 2019. She has been scheduled for repeat bone density scan February 5. Previous DEXA scan showed osteopenia 2013. No history of hepatitis C screening. Low risk. Patient has never smoked.  She had some recent fleeting abdominal pain which she thinks may been a mild diverticulitis flare. Symptoms improved at this time  Past Medical History:  Diagnosis Date  . Arthritis   . Colon polyps   . Diverticulitis   . Hyperlipidemia   . Hypertension   . Hypothyroidism   . PONV (postoperative nausea and vomiting)   . Thyroid disease   . UTI (urinary tract infection)    Past Surgical History:  Procedure Laterality Date  . ABDOMINAL HYSTERECTOMY  1993   TAH, endometriosis  . CHOLECYSTECTOMY  2002  . TOTAL HIP ARTHROPLASTY Right 05/31/2013   Procedure: RIGHT TOTAL HIP ARTHROPLASTY ANTERIOR APPROACH;  Surgeon: Gearlean Alf, MD;  Location: [facility];  Service: Orthopedics;  Laterality: Right;    reports that  has never smoked. she has never used smokeless tobacco. She reports that she does not drink alcohol or use drugs. family history includes Arthritis in her mother; Heart disease in her father and mother; Hypertension in her father and mother; Stroke in her father. Allergies  Allergen Reactions  . Diclofenac   . Lisinopril Swelling  . Simvastatin   . Statins     Body aches all over     Review of Systems  Constitutional: Negative for activity change, appetite change, fatigue, fever and unexpected weight change.  HENT: Negative for ear pain, hearing loss, sore throat and trouble  swallowing.   Eyes: Negative for visual disturbance.  Respiratory: Negative for cough and shortness of breath.   Cardiovascular: Negative for chest pain and palpitations.  Gastrointestinal: Negative for abdominal pain, blood in stool, constipation and diarrhea.  Endocrine: Negative for polydipsia and polyuria.  Genitourinary: Negative for dysuria and hematuria.  Musculoskeletal: Positive for arthralgias. Negative for back pain and myalgias.  Skin: Negative for rash.  Neurological: Negative for dizziness, syncope and headaches.  Hematological: Negative for adenopathy.  Psychiatric/Behavioral: Negative for confusion and dysphoric mood.       Objective:   Physical Exam  Constitutional: She is oriented to person, place, and time. She appears well-developed and well-nourished.  HENT:  Head: Normocephalic and atraumatic.  Mouth/Throat: Oropharynx is clear and moist.  Eyes: EOM are normal. Pupils are equal, round, and reactive to light.  Neck: Normal range of motion. Neck supple. No thyromegaly present.  Cardiovascular: Normal rate, regular rhythm and normal heart sounds. Exam reveals no gallop.  Pulmonary/Chest: Breath sounds normal. No respiratory distress. She has no wheezes. She has no rales.  Abdominal: Soft. Bowel sounds are normal. She exhibits no distension and no mass. There is no tenderness. There is no rebound and no guarding.  Musculoskeletal: Normal range of motion. She exhibits no edema.  Lymphadenopathy:    She has no cervical adenopathy.  Neurological: She is alert and oriented to person, place, and time. She displays normal reflexes. No cranial nerve deficit.  Skin: No rash noted.  Psychiatric: She has a normal mood  and affect. Her behavior is normal. Judgment and thought content normal.       Assessment:     Physical exam. The following issues were addressed    Plan:     -set up lab work including hepatitis C antibody -We discussed shingles vaccine and she will  check on coverage -Needs repeat colonoscopy 2019. Patient is not ready to schedule this point and will let us know when ready -Repeat DEXA scan early next year -Continue daily calcium and vitamin D along with weightbearing exercise  [name] MD [facility] at [facility]

## 2017-10-28 NOTE — Patient Instructions (Signed)
Consider new shingles vaccine- Shingrix We need to set up repeat colonoscopy for 2019.

## 2017-10-29 LAB — HEPATITIS C ANTIBODY
Hepatitis C Ab: NONREACTIVE
SIGNAL TO CUT-OFF: 0.01 (ref <1.00)

## 2017-11-05 ENCOUNTER — Other Ambulatory Visit: Payer: Self-pay | Admitting: Family Medicine

## 2017-11-30 ENCOUNTER — Encounter: Payer: Self-pay | Admitting: Family Medicine

## 2017-11-30 ENCOUNTER — Ambulatory Visit (INDEPENDENT_AMBULATORY_CARE_PROVIDER_SITE_OTHER): Payer: Medicare HMO | Admitting: Family Medicine

## 2017-11-30 VITALS — BP 130/80 | HR 90 | Temp 98.2°F | Wt 133.0 lb

## 2017-11-30 DIAGNOSIS — J0110 Acute frontal sinusitis, unspecified: Secondary | ICD-10-CM | POA: Diagnosis not present

## 2017-11-30 MED ORDER — AMOXICILLIN-POT CLAVULANATE 875-125 MG PO TABS: 1.0000 | ORAL_TABLET | Freq: Two times a day (BID) | ORAL | 0 refills | Status: DC

## 2017-11-30 NOTE — Patient Instructions (Signed)

## 2017-11-30 NOTE — Progress Notes (Signed)
Subjective:     Patient ID: Tonya Hamilton, female   DOB: September 19, 1946, 72 y.o.   MRN: [id number]  HPI Patient seen with sinus congestion and some yellowish nasal discharge over the past week or so. She feels she is getting worse each day. She's tried Mucinex and Sudafed without much improvement. She has frontal sinus pressure and some neck pain. Cough. Yellowish nasal discharge. Initially had some sore throat  Past Medical History:  Diagnosis Date  . Arthritis   . Colon polyps   . Diverticulitis   . Hyperlipidemia   . Hypertension   . Hypothyroidism   . PONV (postoperative nausea and vomiting)   . Thyroid disease   . UTI (urinary tract infection)    Past Surgical History:  Procedure Laterality Date  . ABDOMINAL HYSTERECTOMY  1993   TAH, endometriosis  . CHOLECYSTECTOMY  2002  . TOTAL HIP ARTHROPLASTY Right 05/31/2013   Procedure: RIGHT TOTAL HIP ARTHROPLASTY ANTERIOR APPROACH;  Surgeon: Gearlean Alf, MD;  Location: [facility];  Service: Orthopedics;  Laterality: Right;    reports that  has never smoked. she has never used smokeless tobacco. She reports that she does not drink alcohol or use drugs. family history includes Arthritis in her mother; Heart disease in her father and mother; Hypertension in her father and mother; Stroke in her father. Allergies  Allergen Reactions  . Diclofenac   . Lisinopril Swelling  . Simvastatin   . Statins     Body aches all over     Review of Systems  Constitutional: Negative for fever.  HENT: Positive for congestion, sinus pressure and sinus pain. Negative for sore throat.   Respiratory: Positive for cough.   Neurological: Positive for headaches.       Objective:   Physical Exam  Constitutional: She appears well-developed and well-nourished.  HENT:  Right Ear: External ear normal.  Left Ear: External ear normal.  Mouth/Throat: Oropharynx is clear and moist.  Nasal mucosa erythematous  Neck: Neck supple.  Cardiovascular: Normal rate  and regular rhythm.  Pulmonary/Chest: Effort normal and breath sounds normal. No respiratory distress. She has no wheezes. She has no rales.  Lymphadenopathy:    She has no cervical adenopathy.       Assessment:     Acute sinusitis-frontal and maxillary    Plan:     -Augmentin 875 mg twice daily for 10 days -Continue Mucinex and good hydration -Touch base in one week if not improving  [name] MD [facility] at [facility]

## 2017-12-07 ENCOUNTER — Inpatient Hospital Stay: Admission: RE | Admit: 2017-12-07 | Payer: Medicare PPO | Source: Ambulatory Visit

## 2017-12-14 ENCOUNTER — Other Ambulatory Visit: Payer: Self-pay | Admitting: Family Medicine

## 2017-12-15 ENCOUNTER — Telehealth: Payer: Self-pay | Admitting: Family Medicine

## 2017-12-15 NOTE — Telephone Encounter (Signed)
Copied from [place]. Topic: General - Other >> Dec 15, 2017  1:55 PM Synthia Innocent wrote: Reason for CRM: Patient was told by Dr Elease Hashimoto to contact insurance to see if cologuard would be covered, per pt insurance will cover. Would like to move forward.

## 2017-12-22 ENCOUNTER — Ambulatory Visit
Admission: RE | Admit: 2017-12-22 | Discharge: 2017-12-22 | Disposition: A | Discharge status: Home / Self Care | Payer: Medicare HMO | Source: Ambulatory Visit | Attending: Family Medicine | Admitting: Family Medicine

## 2017-12-22 DIAGNOSIS — Z780 Asymptomatic menopausal state: Secondary | ICD-10-CM | POA: Diagnosis not present

## 2017-12-22 DIAGNOSIS — E2839 Other primary ovarian failure: Secondary | ICD-10-CM

## 2017-12-22 DIAGNOSIS — M85852 Other specified disorders of bone density and structure, left thigh: Secondary | ICD-10-CM | POA: Diagnosis not present

## 2017-12-22 NOTE — Telephone Encounter (Signed)
Order has been placed.

## 2018-01-10 ENCOUNTER — Other Ambulatory Visit: Payer: Self-pay | Admitting: Family Medicine

## 2018-01-10 DIAGNOSIS — Z1231 Encounter for screening mammogram for malignant neoplasm of breast: Secondary | ICD-10-CM

## 2018-01-17 DIAGNOSIS — Z1212 Encounter for screening for malignant neoplasm of rectum: Secondary | ICD-10-CM | POA: Diagnosis not present

## 2018-01-17 DIAGNOSIS — Z1211 Encounter for screening for malignant neoplasm of colon: Secondary | ICD-10-CM | POA: Diagnosis not present

## 2018-01-17 LAB — COLOGUARD: COLOGUARD: NEGATIVE

## 2018-02-07 ENCOUNTER — Telehealth: Payer: Self-pay | Admitting: Family Medicine

## 2018-02-07 NOTE — Telephone Encounter (Signed)
Copied from [place]. Topic: Quick Communication - See Telephone Encounter >> Feb 07, 2018 10:08 AM Percell Belt A wrote: CRM for notification. See Telephone encounter for: 02/07/18. Pt called in looking to get her cologaurd results .  She would like someone to call her with those results   Best number  [phone]

## 2018-02-07 NOTE — Telephone Encounter (Signed)
Cologuard is negative.  Patient is aware.  Noted in chart.

## 2018-02-09 ENCOUNTER — Ambulatory Visit
Admission: RE | Admit: 2018-02-09 | Discharge: 2018-02-09 | Disposition: A | Discharge status: Home / Self Care | Payer: Medicare HMO | Source: Ambulatory Visit | Attending: Family Medicine | Admitting: Family Medicine

## 2018-02-09 DIAGNOSIS — Z1231 Encounter for screening mammogram for malignant neoplasm of breast: Secondary | ICD-10-CM | POA: Diagnosis not present

## 2018-02-22 ENCOUNTER — Ambulatory Visit: Payer: Self-pay | Admitting: Family Medicine

## 2018-02-28 ENCOUNTER — Encounter: Payer: Self-pay | Admitting: Family Medicine

## 2018-02-28 ENCOUNTER — Ambulatory Visit (INDEPENDENT_AMBULATORY_CARE_PROVIDER_SITE_OTHER): Payer: Medicare HMO | Admitting: Family Medicine

## 2018-02-28 VITALS — BP 102/70 | HR 100 | Temp 98.9°F | Wt 134.9 lb

## 2018-02-28 DIAGNOSIS — M8580 Other specified disorders of bone density and structure, unspecified site: Secondary | ICD-10-CM

## 2018-02-28 NOTE — Progress Notes (Signed)
  Subjective:     Patient ID: Tonya Hamilton, female   DOB: [birth year], 72 y.o.   MRN: [id number]  HPI Patient here to discuss recent bone density report. She had T score -2.2 left femur. T score of 0.1 spine. She's had previous right hip replacement secondary to osteoarthritis. No history of fracture. She takes vitamin D 2000 international units daily and recently started some calcium supplementation. She walks frequently for exercise.  She's never been on any FDA approved treatments for osteoporosis. Her FRAX score came back 20.3% 10 year risk of major osteoporotic fracture and 5% risk of hip fracture  She has long-standing history of GERD and takes omeprazole. She is very reluctant to the idea of taking bisphosphonates.  Past Medical History:  Diagnosis Date  . Arthritis   . Colon polyps   . Diverticulitis   . Hyperlipidemia   . Hypertension   . Hypothyroidism   . PONV (postoperative nausea and vomiting)   . Thyroid disease   . UTI (urinary tract infection)    Past Surgical History:  Procedure Laterality Date  . ABDOMINAL HYSTERECTOMY  1993   TAH, endometriosis  . CHOLECYSTECTOMY  2002  . TOTAL HIP ARTHROPLASTY Right 05/31/2013   Procedure: RIGHT TOTAL HIP ARTHROPLASTY ANTERIOR APPROACH;  Surgeon: Gearlean Alf, MD;  Location: [facility];  Service: Orthopedics;  Laterality: Right;    reports that she has never smoked. She has never used smokeless tobacco. She reports that she does not drink alcohol or use drugs. family history includes Arthritis in her mother; Heart disease in her father and mother; Hypertension in her father and mother; Stroke in her father. Allergies  Allergen Reactions  . Diclofenac   . Lisinopril Swelling  . Simvastatin   . Statins     Body aches all over     Review of Systems  Constitutional: Negative for fatigue.  Eyes: Negative for visual disturbance.  Respiratory: Negative for cough, chest tightness, shortness of breath and wheezing.    Cardiovascular: Negative for chest pain, palpitations and leg swelling.  Neurological: Negative for dizziness, seizures, syncope, weakness, light-headedness and headaches.       Objective:   Physical Exam  Constitutional: She appears well-developed and well-nourished.  Cardiovascular: Normal rate, regular rhythm and normal heart sounds.  Pulmonary/Chest: Effort normal and breath sounds normal.       Assessment:     Osteopenia.  FRAX score of 20.3 and 5.0% 10 year risk of major osteoporotic fractures and hip fracture    Plan:     -we discussed options for treatment and she is very reluctant to look at any FDA approved medical therapies. She has history of GERD and declines bisphosphonates. We discussed limitations of Evista (possible side effects and no data for hip fracture prevention). We discussed potential injectable therapy such as Prolia and Reclast but she declines at this time -Continue regular weightbearing exercise -Continue daily vitamin D 1000 to 2000 international units -we have recommended goal of calcium 1200 milligrams daily and get as much as possible through dietary means -Recheck DEXA scan in 2 years  [name] MD [facility] at [facility]

## 2018-02-28 NOTE — Patient Instructions (Signed)
Calcium Intake Recommendations Calcium is a mineral that affects many functions in the body, including:  Blood clotting.  Blood vessel function.  Nerve impulse conduction.  Hormone secretion.  Muscle contraction.  Bone and teeth functions.  Most of your body's calcium supply is stored in your bones and teeth. When your calcium stores are low, you may be at risk for low bone mass, bone loss, and bone fractures. Consuming enough calcium helps to grow healthy bones and teeth and to prevent breakdown over time. It is very important that you get enough calcium if you are:  A child undergoing rapid growth.  An adolescent girl.  A pre- or post-menopausal woman.  A woman whose menstrual cycle has stopped due to anorexia nervosa or regular intense exercise.  An individual with lactose intolerance or a milk allergy.  A vegetarian.  What is my plan? Try to consume the recommended amount of calcium daily based on your age. Depending on your overall health, your health care provider may recommend increased calcium intake.General daily calcium intake recommendations by age are:  Birth to 6 months: 200 mg.  Infants 7 to 12 months: 260 mg.  Children 1 to 3 years: 700 mg.  Children 4 to 8 years: 1,000 mg.  Children 9 to 13 years: 1,300 mg.  Teens 14 to 18 years: 1,300 mg.  Adults 19 to 50 years: 1,000 mg.  Adult women 51 to 70 years: 1,200 mg.  Adult men 51 to 70 years: 1,000 mg.  Adults 71 years and older: 1,200 mg.  Pregnant and breastfeeding teens: 1,300 mg.  Pregnant and breastfeeding adults: 1,000 mg.  What do I need to know about calcium intake?  In order for the body to absorb calcium, it needs vitamin D. You can get vitamin D through: ? Direct exposure of the skin to sunlight. ? Foods, such as egg yolks, liver, saltwater fish, and fortified milk. ? Supplements.  Consuming too much calcium may cause: ? Constipation. ? Decreased absorption of iron and  zinc. ? Kidney stones.  Calcium supplements may interact with certain medicines. Check with your health care provider before starting any calcium supplements.  Try to get most of your calcium from food. What foods can I eat? Grains  Fortified oatmeal. Fortified ready-to-eat cereals. Fortified frozen waffles. Vegetables Turnip greens. Broccoli. Fruits Fortified orange juice. Meats and Other Protein Sources Canned sardines with bones. Canned salmon with bones. Soy beans. Tofu. Baked beans. Almonds. [place] nuts. Sunflower seeds. Dairy Milk. Yogurt. Cheese. Cottage cheese. Beverages Fortified soy milk. Fortified rice milk. Sweets/Desserts Pudding. Ice Cream. Milkshakes. Blackstrap molasses. The items listed above may not be a complete list of recommended foods or beverages. Contact your dietitian for more options. What foods can affect my calcium intake? It may be more difficult for your body to use calcium or calcium may leave your body more quickly if you consume large amounts of:  Sodium.  Protein.  Caffeine.  Alcohol.  This information is not intended to replace advice given to you by your health care provider. Make sure you discuss any questions you have with your health care provider. Document Released: 06/02/2004 Document Revised: 05/08/2016 Document Reviewed: 03/27/2014 Elsevier Interactive Patient Education  2018 [place] protect organs, store calcium, and anchor muscles. Good health habits, such as eating nutritious foods and exercising regularly, are important for maintaining healthy bones. They can also help to prevent a condition that causes bones to lose density and become weak and brittle (osteoporosis). Why  is bone mass important? Bone mass refers to the amount of bone tissue that you have. The higher your bone mass, the stronger your bones. An important step toward having healthy bones throughout life is to have strong and dense bones  during childhood. A young adult who has a high bone mass is more likely to have a high bone mass later in life. Bone mass at its greatest it is called peak bone mass. A large decline in bone mass occurs in older adults. In women, it occurs about the time of menopause. During this time, it is important to practice good health habits, because if more bone is lost than what is replaced, the bones will become less healthy and more likely to break (fracture). If you find that you have a low bone mass, you may be able to prevent osteoporosis or further bone loss by changing your diet and lifestyle. How can I find out if my bone mass is low? Bone mass can be measured with an X-ray test that is called a bone mineral density (BMD) test. This test is recommended for all women who are age 478 or older. It may also be recommended for men who are age 70 or older, or for people who are more likely to develop osteoporosis due to:  Having bones that break easily.  Having a long-term disease that weakens bones, such as kidney disease or rheumatoid arthritis.  Having menopause earlier than normal.  Taking medicine that weakens bones, such as steroids, thyroid hormones, or hormone treatment for breast cancer or prostate cancer.  Smoking.  Drinking three or more alcoholic drinks each day.  What are the nutritional recommendations for healthy bones? To have healthy bones, you need to get enough of the right minerals and vitamins. Most nutrition experts recommend getting these nutrients from the foods that you eat. Nutritional recommendations vary from person to person. Ask your health care provider what is healthy for you. Here are some general guidelines. Calcium Recommendations Calcium is the most important (essential) mineral for bone health. Most people can get enough calcium from their diet, but supplements may be recommended for people who are at risk for osteoporosis. Good sources of calcium include:  Dairy  products, such as low-fat or nonfat milk, cheese, and yogurt.  Dark green leafy vegetables, such as bok choy and broccoli.  Calcium-fortified foods, such as orange juice, cereal, bread, soy beverages, and tofu products.  Nuts, such as almonds.  Follow these recommended amounts for daily calcium intake:  Children, age 70?3: 700 mg.  Children, age 47?8: 1,000 mg.  Children, age 95?13: 1,300 mg.  Teens, age 32?18: 1,300 mg.  Adults, age 67?50: 1,000 mg.  Adults, age 53?70: ? Men: 1,000 mg. ? Women: 1,200 mg.  Adults, age 84 or older: 1,200 mg.  Pregnant and breastfeeding females: ? Teens: 1,300 mg. ? Adults: 1,000 mg.  Vitamin D Recommendations Vitamin D is the most essential vitamin for bone health. It helps the body to absorb calcium. Sunlight stimulates the skin to make vitamin D, so be sure to get enough sunlight. If you live in a cold climate or you do not get outside often, your health care provider may recommend that you take vitamin D supplements. Good sources of vitamin D in your diet include:  Egg yolks.  Saltwater fish.  Milk and cereal fortified with vitamin D.  Follow these recommended amounts for daily vitamin D intake:  Children and teens, age 475?18: 48 international units.  Adults, age  82 or younger: 400-800 international units.  Adults, age 80 or older: 800-1,000 international units.  Other Nutrients Other nutrients for bone health include:  Phosphorus. This mineral is found in meat, poultry, dairy foods, nuts, and legumes. The recommended daily intake for adult men and adult women is 700 mg.  Magnesium. This mineral is found in seeds, nuts, dark green vegetables, and legumes. The recommended daily intake for adult men is 400?420 mg. For adult women, it is 310?320 mg.  Vitamin K. This vitamin is found in green leafy vegetables. The recommended daily intake is 120 mg for adult men and 90 mg for adult women.  What type of physical activity is best for  building and maintaining healthy bones? Weight-bearing and strength-building activities are important for building and maintaining peak bone mass. Weight-bearing activities cause muscles and bones to work against gravity. Strength-building activities increases muscle strength that supports bones. Weight-bearing and muscle-building activities include:  Walking and hiking.  Jogging and running.  Dancing.  Gym exercises.  Lifting weights.  Tennis and racquetball.  Climbing stairs.  Aerobics.  Adults should get at least 30 minutes of moderate physical activity on most days. Children should get at least 60 minutes of moderate physical activity on most days. Ask your health care provide what type of exercise is best for you. Where can I find more information? For more information, check out the following websites:  [place]: YardHomes.se  [employer] of Health: http://www.niams.AnonymousEar.fr.asp  This information is not intended to replace advice given to you by your health care provider. Make sure you discuss any questions you have with your health care provider. Document Released: 01/09/2004 Document Revised: 05/08/2016 Document Reviewed: 10/24/2014 Elsevier Interactive Patient Education  2018 [place] D supplement    Aim for calcium 1,200 mg daily.

## 2018-04-22 DIAGNOSIS — L821 Other seborrheic keratosis: Secondary | ICD-10-CM | POA: Diagnosis not present

## 2018-04-22 DIAGNOSIS — L918 Other hypertrophic disorders of the skin: Secondary | ICD-10-CM | POA: Diagnosis not present

## 2018-04-22 DIAGNOSIS — L708 Other acne: Secondary | ICD-10-CM | POA: Diagnosis not present

## 2018-04-22 DIAGNOSIS — D229 Melanocytic nevi, unspecified: Secondary | ICD-10-CM | POA: Diagnosis not present

## 2018-05-18 ENCOUNTER — Other Ambulatory Visit: Payer: Self-pay | Admitting: *Deleted

## 2018-05-18 ENCOUNTER — Telehealth: Payer: Self-pay | Admitting: Family Medicine

## 2018-05-18 MED ORDER — AMLODIPINE BESYLATE 5 MG PO TABS: 5.0000 mg | ORAL_TABLET | Freq: Every day | ORAL | 5 refills | Status: DC

## 2018-05-18 MED ORDER — LEVOTHYROXINE SODIUM 25 MCG PO TABS: 25.0000 ug | ORAL_TABLET | Freq: Every day | ORAL | 1 refills | Status: DC

## 2018-05-18 NOTE — Telephone Encounter (Signed)
Rx refilled per protocol. LOV/lab: 10/28/17

## 2018-05-18 NOTE — Telephone Encounter (Signed)
Copied from [place] [phone]. Topic: Quick Communication - See Telephone Encounter >> May 18, 2018  8:29 AM Mylinda Latina, NT wrote: CRM for notification. See Telephone encounter for: 05/18/18. Patient  called and states she needs a refill of her amLODipine (NORVASC) 5 MG tablet,levothyroxine (SYNTHROID, LEVOTHROID) 25 MCG tablet  [id number] - [place], [place] - [address] [place] [address] [phone] (Phone) [phone] (Fax)

## 2018-05-19 ENCOUNTER — Telehealth: Payer: Self-pay

## 2018-05-19 NOTE — Telephone Encounter (Signed)
Pt is requesting refills for amlodipine 5 MG and levothyroxine 25 MCG   Last OV 02/28/2018   Sent to [facility]

## 2018-05-19 NOTE — Telephone Encounter (Signed)
Rxs sent on 7/17 by nurse at [facility].

## 2018-07-03 ENCOUNTER — Other Ambulatory Visit: Payer: Self-pay | Admitting: Family Medicine

## 2018-07-18 ENCOUNTER — Other Ambulatory Visit: Payer: Self-pay

## 2018-07-18 ENCOUNTER — Encounter: Payer: Self-pay | Admitting: Family Medicine

## 2018-07-18 ENCOUNTER — Ambulatory Visit (INDEPENDENT_AMBULATORY_CARE_PROVIDER_SITE_OTHER): Payer: Medicare HMO | Admitting: Family Medicine

## 2018-07-18 VITALS — BP 142/86 | HR 88 | Temp 98.4°F | Resp 17 | Ht 64.5 in | Wt 135.8 lb

## 2018-07-18 DIAGNOSIS — E039 Hypothyroidism, unspecified: Secondary | ICD-10-CM | POA: Diagnosis not present

## 2018-07-18 DIAGNOSIS — R5383 Other fatigue: Secondary | ICD-10-CM

## 2018-07-18 DIAGNOSIS — I10 Essential (primary) hypertension: Secondary | ICD-10-CM | POA: Diagnosis not present

## 2018-07-18 DIAGNOSIS — R002 Palpitations: Secondary | ICD-10-CM | POA: Diagnosis not present

## 2018-07-18 NOTE — Patient Instructions (Signed)
Palpitations A palpitation is the feeling that your heartbeat is irregular or is faster than normal. It may feel like your heart is fluttering or skipping a beat. Palpitations are usually not a serious problem. They may be caused by many things, including smoking, caffeine, alcohol, stress, and certain medicines. Although most causes of palpitations are not serious, palpitations can be a sign of a serious medical problem. In some cases, you may need further medical evaluation. Follow these instructions at home: Pay attention to any changes in your symptoms. Take these actions to help with your condition:  Avoid the following: ? Caffeinated coffee, tea, soft drinks, diet pills, and energy drinks. ? Chocolate. ? Alcohol.  Do not use any tobacco products, such as cigarettes, chewing tobacco, and e-cigarettes. If you need help quitting, ask your health care provider.  Try to reduce your stress and anxiety. Things that can help you relax include: ? Yoga. ? Meditation. ? Physical activity, such as swimming, jogging, or walking. ? Biofeedback. This is a method that helps you learn to use your mind to control things in your body, such as your heartbeats.  Get plenty of rest and sleep.  Take over-the-counter and prescription medicines only as told by your health care provider.  Keep all follow-up visits as told by your health care provider. This is important.  Contact a health care provider if:  You continue to have a fast or irregular heartbeat after 24 hours.  Your palpitations occur more often. Get help right away if:  You have chest pain or shortness of breath.  You have a severe headache.  You feel dizzy or you faint. This information is not intended to replace advice given to you by your health care provider. Make sure you discuss any questions you have with your health care provider. Document Released: 10/16/2000 Document Revised: 03/23/2016 Document Reviewed: 07/04/2015 Elsevier  Interactive Patient Education  2018 Elsevier Inc.  

## 2018-07-18 NOTE — Progress Notes (Signed)
  Subjective:     Patient ID: Tonya Hamilton, female   DOB: Aug 22, 1946, 72 y.o.   MRN: [id number]  HPI Patient states she's had about 2 weeks now of increased palpitations. She's had some lengthy travel with driving trip up to [place] and back. They drove 11.5 hours straight second day back. When she got home that night she laid down in bed and noticed some palpitations and then had a brief period where she had some "shakes". She states she was not febrile. No chest pain. No dyspnea. No syncope.  She states she has had some palpitations for years intermittently.  She has hypothyroidism on replacement. only drinks one cup of coffee per day and sometimes one soda. No regular alcohol use.  She also has some non-specific fatigue.  No appetite or weight changes.  Past Medical History:  Diagnosis Date  . Arthritis   . Colon polyps   . Diverticulitis   . Hyperlipidemia   . Hypertension   . Hypothyroidism   . PONV (postoperative nausea and vomiting)   . Thyroid disease   . UTI (urinary tract infection)    Past Surgical History:  Procedure Laterality Date  . ABDOMINAL HYSTERECTOMY  1993   TAH, endometriosis  . CHOLECYSTECTOMY  2002  . TOTAL HIP ARTHROPLASTY Right 05/31/2013   Procedure: RIGHT TOTAL HIP ARTHROPLASTY ANTERIOR APPROACH;  Surgeon: Gearlean Alf, MD;  Location: [facility];  Service: Orthopedics;  Laterality: Right;    reports that she has never smoked. She has never used smokeless tobacco. She reports that she does not drink alcohol or use drugs. family history includes Arthritis in her mother; Heart disease in her father and mother; Hypertension in her father and mother; Stroke in her father. Allergies  Allergen Reactions  . Diclofenac   . Lisinopril Swelling  . Simvastatin   . Statins     Body aches all over     Review of Systems  Constitutional: Positive for fatigue. Negative for chills and fever.  Eyes: Negative for visual disturbance.  Respiratory: Negative for  cough, chest tightness, shortness of breath and wheezing.   Cardiovascular: Positive for palpitations. Negative for chest pain and leg swelling.  Endocrine: Negative for polydipsia and polyuria.  Neurological: Negative for dizziness, seizures, syncope, weakness, light-headedness and headaches.       Objective:   Physical Exam  Constitutional: She appears well-developed and well-nourished.  Neck: Neck supple.  Cardiovascular: Normal rate and regular rhythm. Exam reveals no gallop.  No murmur heard. Pulmonary/Chest: Effort normal and breath sounds normal. She has no wheezes. She has no rales.  Musculoskeletal: She exhibits no edema.       Assessment:     #1  2 week history of increased palpitations. She does not have any red flags such as chest pains, dyspnea, dizziness. Question symptomatic PACs or PVCs.  #2 hypertension- stable    Plan:     -check EKG= NSR with no acute changes -Minimize caffeine intake -Repeat TSH -consider event monitor for any persistent or worsening symptoms.  [name] MD [facility] at [facility]

## 2018-07-19 ENCOUNTER — Encounter: Payer: Self-pay | Admitting: Family Medicine

## 2018-07-19 LAB — CBC WITH DIFFERENTIAL/PLATELET
BASOS ABS: 0.1 10*3/uL (ref 0.0–0.1)
BASOS PCT: 0.7 % (ref 0.0–3.0)
EOS ABS: 0.3 10*3/uL (ref 0.0–0.7)
Eosinophils Relative: 3 % (ref 0.0–5.0)
HEMATOCRIT: 39.2 % (ref 36.0–46.0)
Hemoglobin: 13.1 g/dL (ref 12.0–15.0)
LYMPHS ABS: 1.9 10*3/uL (ref 0.7–4.0)
LYMPHS PCT: 20.6 % (ref 12.0–46.0)
MCHC: 33.4 g/dL (ref 30.0–36.0)
MCV: 88.8 fl (ref 78.0–100.0)
MONOS PCT: 8 % (ref 3.0–12.0)
Monocytes Absolute: 0.8 10*3/uL (ref 0.1–1.0)
NEUTROS ABS: 6.4 10*3/uL (ref 1.4–7.7)
NEUTROS PCT: 67.7 % (ref 43.0–77.0)
PLATELETS: 344 10*3/uL (ref 150.0–400.0)
RBC: 4.41 Mil/uL (ref 3.87–5.11)
RDW: 14.2 % (ref 11.5–15.5)
WBC: 9.4 10*3/uL (ref 4.0–10.5)

## 2018-07-19 LAB — TSH: TSH: 2.35 u[IU]/mL (ref 0.35–4.50)

## 2018-07-19 NOTE — Telephone Encounter (Signed)
Patient informed of my chart message below (unable to access right at the moment), patient states that's good news but symptoms have not improved, patient feeling lethargic and fatigue, please advise

## 2018-08-03 ENCOUNTER — Other Ambulatory Visit: Payer: Self-pay | Admitting: Family Medicine

## 2018-09-03 DIAGNOSIS — R69 Illness, unspecified: Secondary | ICD-10-CM | POA: Diagnosis not present

## 2018-09-20 ENCOUNTER — Ambulatory Visit: Payer: Medicare PPO

## 2018-10-04 NOTE — Progress Notes (Addendum)
Subjective:   Tonya Hamilton is a 72 y.o. female who presents for Medicare Annual (Subsequent) preventive examination.  Reports health as good  07/18/2018 for palpitations- no c/o today  States stress is been very high Spouse had bladder cancer; no cancer now  Looking forward to the holidays New dog    Diet hdl 39  Breakfast yogurt, eggs, hash browns Lunch; depends on breakfast but generally has a salad Supper cooks and goes out  [employer], green beans and beets;  Pineapple chucks for desert  Exercise Walking the dog  1/2 poodle and 1/2 old english sheep dog.. 60 lbs   Stress relief is found through prayer and God gives her peace   There are no preventive care reminders to display for this patient.  Per Dr. Elease Hashimoto note; osteopenia with score -2.2  12/2017  Discussed meds and opted for conservative treatment Also invited to review the osteoporosis foundation site   Colonoscopy 2009 - had colo guard 12/2017 - negative  Repeated in 3 years   Mammogram 01/2018  Cardiac Risk Factors include: advanced age (>55men, >57 women);family history of premature cardiovascular disease;hypertension     Objective:     Vitals: BP 124/74   Pulse 85   Ht 5\' 4"  (1.626 m)   Wt 135 lb (61.2 kg)   SpO2 96%   BMI 23.17 kg/m   Body mass index is 23.17 kg/m.  Advanced Directives 10/05/2018 09/17/2017 05/31/2013 05/31/2013 05/25/2013  Does Patient Have a Medical Advance Directive? Yes Yes Patient has advance directive, copy not in chart - Patient has advance directive, copy not in chart  Type of Advance Directive - - [place];Living will - [place];Living will  Copy of [place] in Chart? - - Copy requested from family - -  Pre-existing out of facility DNR order (yellow form or pink MOST form) - - No No -    Tobacco Social History   Tobacco Use  Smoking Status Never Smoker  Smokeless Tobacco Never Used     Counseling  given: Yes   Clinical Intake:   Past Medical History:  Diagnosis Date  . Arthritis   . Colon polyps   . Diverticulitis   . Hyperlipidemia   . Hypertension   . Hypothyroidism   . PONV (postoperative nausea and vomiting)   . Thyroid disease   . UTI (urinary tract infection)    Past Surgical History:  Procedure Laterality Date  . ABDOMINAL HYSTERECTOMY  1993   TAH, endometriosis  . CHOLECYSTECTOMY  2002  . TOTAL HIP ARTHROPLASTY Right 05/31/2013   Procedure: RIGHT TOTAL HIP ARTHROPLASTY ANTERIOR APPROACH;  Surgeon: Gearlean Alf, MD;  Location: [facility];  Service: Orthopedics;  Laterality: Right;   Family History  Problem Relation Age of Onset  . Arthritis Mother   . Hypertension Mother   . Heart disease Mother   . Hypertension Father   . Stroke Father   . Heart disease Father    Social History   Socioeconomic History  . Marital status: Married    Spouse name: Not on file  . Number of children: Not on file  . Years of education: Not on file  . Highest education level: Not on file  Occupational History  . Not on file  Social Needs  . Financial resource strain: Not on file  . Food insecurity:    Worry: Not on file    Inability: Not on file  . Transportation needs:  Medical: Not on file    Non-medical: Not on file  Tobacco Use  . Smoking status: Never Smoker  . Smokeless tobacco: Never Used  Substance and Sexual Activity  . Alcohol use: No  . Drug use: No  . Sexual activity: Not Currently  Lifestyle  . Physical activity:    Days per week: Not on file    Minutes per session: Not on file  . Stress: Not on file  Relationships  . Social connections:    Talks on phone: Not on file    Gets together: Not on file    Attends religious service: Not on file    Active member of club or organization: Not on file    Attends meetings of clubs or organizations: Not on file    Relationship status: Not on file  Other Topics Concern  . Not on file  Social History  Narrative  . Not on file    Outpatient Encounter Medications as of 10/05/2018  Medication Sig  . ALPRAZolam (XANAX) 0.5 MG tablet TAKE 1 TO 2 TABLETS BY MOUTH EVERY NIGHT AT BEDTIME  . amLODipine (NORVASC) 5 MG tablet Take 1 tablet (5 mg total) by mouth daily.  . Cholecalciferol 1000 units capsule Take by mouth.  . levothyroxine (SYNTHROID, LEVOTHROID) 25 MCG tablet Take 1 tablet (25 mcg total) by mouth daily.  . Multiple Vitamins-Minerals (CENTRUM SILVER PO) Take by mouth.  . Multiple Vitamins-Minerals (OCUVITE ADULT 50+ PO) Take by mouth.  Marland Kitchen omeprazole (PRILOSEC) 20 MG capsule Take 20 mg by mouth daily.   Marland Kitchen trimethoprim (TRIMPEX) 100 MG tablet TAKE 1 TABLET BY MOUTH EVERY DAY   No facility-administered encounter medications on file as of 10/05/2018.     Activities of Daily Living In your present state of health, do you have any difficulty performing the following activities: 10/05/2018  Hearing? N  Vision? N  Difficulty concentrating or making decisions? N  Walking or climbing stairs? N  Dressing or bathing? N  Doing errands, shopping? N  Preparing Food and eating ? N  Using the Toilet? N  In the past six months, have you accidently leaked urine? N  Do you have problems with loss of bowel control? N  Managing your Medications? N  Managing your Finances? N  Housekeeping or managing your Housekeeping? N  Some recent data might be hidden    Patient Care Team: [name], MD as PCP - General (Family Medicine)    Assessment:   This is a routine wellness examination for Tonya Hamilton.  Exercise Activities and Dietary recommendations Current Exercise Habits: Home exercise routine, Type of exercise: walking, Time (Minutes): 60, Frequency (Times/Week): 7, Weekly Exercise (Minutes/Week): 420, Intensity: Moderate  Goals    . Patient Stated     Maintain your health and continue to take multi vitamins  And walk the dog     . Patient Stated     To learn more about kale  Add to  salad and learn more about how to integrate in the diet        Fall Risk Fall Risk  10/05/2018 10/28/2017 09/17/2017 04/07/2016 03/08/2015  Falls in the past year? 0 No No No No     Depression Screen PHQ 2/9 Scores 10/05/2018 10/28/2017 09/17/2017 04/07/2016  PHQ - 2 Score 0 0 0 0     Cognitive Function MMSE - Mini Mental State Exam 10/05/2018 09/17/2017  Not completed: (No Data) (No Data)     Ad8 score reviewed for issues:  Issues making decisions:  Less interest in hobbies / activities:  Repeats questions, stories (family complaining):  Trouble using ordinary gadgets (microwave, computer, phone):  Forgets the month or year:   Mismanaging finances:   Remembering appts:  Daily problems with thinking and/or memory: Ad8 score is=0        Immunization History  Administered Date(s) Administered  . Influenza Split 08/02/2010, 08/15/2012  . Influenza, High Dose Seasonal PF 08/20/2012, 09/19/2014, 07/01/2016, 08/17/2017, 09/03/2018  . Influenza-Unspecified 08/31/2017  . Pneumococcal Conjugate-13 05/01/2016  . Pneumococcal Polysaccharide-23 11/02/2004, 09/17/2017  . Tdap 11/03/2007, 11/15/2008  . Zoster 11/02/2005     Screening Tests Health Maintenance  Topic Date Due  . TETANUS/TDAP  11/15/2018  . MAMMOGRAM  02/10/2020  . Fecal DNA (Cologuard)  01/17/2021  . INFLUENZA VACCINE  Completed  . DEXA SCAN  Completed  . Hepatitis C Screening  Completed  . PNA vac Low Risk Adult  Completed        Plan:      PCP Notes   Health Maintenance Per Dr. Elease Hashimoto note; osteopenia with score -2.2  12/2017  Discussed meds and opted for conservative treatment Also invited to review the osteoporosis foundation site   Colonoscopy 2009 - had colo guard 12/2017 - negative  Repeated in 3 years   Mammogram 01/2018   Abnormal Screens  none  Referrals  none  Patient concerns; none  Nurse Concerns; As noted   Next PCP apt As noted     I have personally  reviewed and noted the following in the patient's chart:   . Medical and social history . Use of alcohol, tobacco or illicit drugs  . Current medications and supplements . Functional ability and status . Nutritional status . Physical activity . Advanced directives . List of other physicians . Hospitalizations, surgeries, and ER visits in previous 12 months . Vitals . Screenings to include cognitive, depression, and falls . Referrals and appointments  In addition, I have reviewed and discussed with patient certain preventive protocols, quality metrics, and best practice recommendations. A written personalized care plan for preventive services as well as general preventive health recommendations were provided to patient.     YJEHU,DJSHF, RN  10/05/2018  I have reviewed the documentation for the AWV and [place] provided by the health coach and agree with their documentation. I was immediately available for any questions  [name] MD [facility] at [facility]

## 2018-10-05 ENCOUNTER — Ambulatory Visit (INDEPENDENT_AMBULATORY_CARE_PROVIDER_SITE_OTHER): Payer: Medicare HMO

## 2018-10-05 VITALS — BP 124/74 | HR 85 | Ht 64.0 in | Wt 135.0 lb

## 2018-10-05 DIAGNOSIS — Z0000 Encounter for general adult medical examination without abnormal findings: Secondary | ICD-10-CM

## 2018-10-05 NOTE — Patient Instructions (Addendum)
Tonya Hamilton , Thank you for taking time to come for your Medicare Wellness Visit. I appreciate your ongoing commitment to your health goals. Please review the following plan we discussed and let me know if I can assist you in the future.    Calcium 1200mg  with Vit D 800u per day; more as directed by physician Strength building exercises discussed; can include walking; housework; small weights or stretch bands; [facility] sne[facility]  Please visit the osteoporosis foundation.org for up to date recommendations  Shingrix is a vaccine for the prevention of Shingles in Adults 50 and older.  If you are on Medicare, the shingrix is covered under your Part D plan, so you will take both of the vaccines in the series at your pharmacy. Please check with your benefits regarding applicable copays or out of pocket expenses.  The Shingrix is given in 2 vaccines approx 8 weeks apart. You must receive the 2nd dose prior to 6 months from receipt of the first. Please have the pharmacist print out you Immunization  dates for our office records     These are the goals we discussed: Goals    . Patient Stated     Maintain your health and continue to take multi vitamins  And walk the dog     . Patient Stated     To learn more about kale  Add to salad and learn more about how to integrate in the diet        This is a list of the screening recommended for you and due dates:  Health Maintenance  Topic Date Due  . Tetanus Vaccine  11/15/2018  . Mammogram  02/10/2020  . Cologuard (Stool DNA test)  01/17/2021  . Flu Shot  Completed  . DEXA scan (bone density measurement)  Completed  .  Hepatitis C: One time screening is recommended by Center for Disease Control  (CDC) for  adults born from [birth year] through 1965.   Completed  . Pneumonia vaccines  Completed      Fall Prevention in the Home Falls can cause injuries. They can happen to people of all ages. There are many things you can do to make  your home safe and to help prevent falls. What can I do on the outside of my home?  Regularly fix the edges of walkways and driveways and fix any cracks.  Remove anything that might make you trip as you walk through a door, such as a raised step or threshold.  Trim any bushes or trees on the path to your home.  Use bright outdoor lighting.  Clear any walking paths of anything that might make someone trip, such as rocks or tools.  Regularly check to see if handrails are loose or broken. Make sure that both sides of any steps have handrails.  Any raised decks and porches should have guardrails on the edges.  Have any leaves, snow, or ice cleared regularly.  Use sand or salt on walking paths during winter.  Clean up any spills in your garage right away. This includes oil or grease spills. What can I do in the bathroom?  Use night lights.  Install grab bars by the toilet and in the tub and shower. Do not use towel bars as grab bars.  Use non-skid mats or decals in the tub or shower.  If you need to sit down in the shower, use a plastic, non-slip stool.  Keep the floor dry. Clean up any  water that spills on the floor as soon as it happens.  Remove soap buildup in the tub or shower regularly.  Attach bath mats securely with double-sided non-slip rug tape.  Do not have throw rugs and other things on the floor that can make you trip. What can I do in the bedroom?  Use night lights.  Make sure that you have a light by your bed that is easy to reach.  Do not use any sheets or blankets that are too big for your bed. They should not hang down onto the floor.  Have a firm chair that has side arms. You can use this for support while you get dressed.  Do not have throw rugs and other things on the floor that can make you trip. What can I do in the kitchen?  Clean up any spills right away.  Avoid walking on wet floors.  Keep items that you use a lot in easy-to-reach  places.  If you need to reach something above you, use a strong step stool that has a grab bar.  Keep electrical cords out of the way.  Do not use floor polish or wax that makes floors slippery. If you must use wax, use non-skid floor wax.  Do not have throw rugs and other things on the floor that can make you trip. What can I do with my stairs?  Do not leave any items on the stairs.  Make sure that there are handrails on both sides of the stairs and use them. Fix handrails that are broken or loose. Make sure that handrails are as long as the stairways.  Check any carpeting to make sure that it is firmly attached to the stairs. Fix any carpet that is loose or worn.  Avoid having throw rugs at the top or bottom of the stairs. If you do have throw rugs, attach them to the floor with carpet tape.  Make sure that you have a light switch at the top of the stairs and the bottom of the stairs. If you do not have them, ask someone to add them for you. What else can I do to help prevent falls?  Wear shoes that: ? Do not have high heels. ? Have rubber bottoms. ? Are comfortable and fit you well. ? Are closed at the toe. Do not wear sandals.  If you use a stepladder: ? Make sure that it is fully opened. Do not climb a closed stepladder. ? Make sure that both sides of the stepladder are locked into place. ? Ask someone to hold it for you, if possible.  Clearly mark and make sure that you can see: ? Any grab bars or handrails. ? First and last steps. ? Where the edge of each step is.  Use tools that help you move around (mobility aids) if they are needed. These include: ? Canes. ? Walkers. ? Scooters. ? Crutches.  Turn on the lights when you go into a dark area. Replace any light bulbs as soon as they burn out.  Set up your furniture so you have a clear path. Avoid moving your furniture around.  If any of your floors are uneven, fix them.  If there are any pets around you, be  aware of where they are.  Review your medicines with your doctor. Some medicines can make you feel dizzy. This can increase your chance of falling. Ask your doctor what other things that you can do to help prevent falls. This information  is not intended to replace advice given to you by your health care provider. Make sure you discuss any questions you have with your health care provider. Document Released: 08/15/2009 Document Revised: 03/26/2016 Document Reviewed: 11/23/2014 Elsevier Interactive Patient Education  2018 [employer].   Hearing Loss Hearing loss is a partial or total loss of the ability to hear. This can be temporary or permanent, and it can happen in one or both ears. Hearing loss may be referred to as deafness. Medical care is necessary to treat hearing loss properly and to prevent the condition from getting worse. Your hearing may partially or completely come back, depending on what caused your hearing loss and how severe it is. In some cases, hearing loss is permanent. What are the causes? Common causes of hearing loss include:  Too much wax in the ear canal.  Infection of the ear canal or middle ear.  Fluid in the middle ear.  Injury to the ear or surrounding area.  An object stuck in the ear.  Prolonged exposure to loud sounds, such as music.  Less common causes of hearing loss include:  Tumors in the ear.  Viral or bacterial infections, such as meningitis.  A hole in the eardrum (perforated eardrum).  Problems with the hearing nerve that sends signals between the brain and the ear.  Certain medicines.  What are the signs or symptoms? Symptoms of this condition may include:  Difficulty telling the difference between sounds.  Difficulty following a conversation when there is background noise.  Lack of response to sounds in your environment. This may be most noticeable when you do not respond to startling sounds.  Needing to turn up the volume on  the television, radio, etc.  Ringing in the ears.  Dizziness.  Pain in the ears.  How is this diagnosed? This condition is diagnosed based on a physical exam and a hearing test (audiometry). The audiometry test will be performed by a hearing specialist (audiologist). You may also be referred to an ear, nose, and throat (ENT) specialist (otolaryngologist). How is this treated? Treatment for recent onset of hearing loss may include:  Ear wax removal.  Being prescribed medicines to prevent infection (antibiotics).  Being prescribed medicines to reduce inflammation (corticosteroids).  Follow these instructions at home:  If you were prescribed an antibiotic medicine, take it as told by your health care provider. Do not stop taking the antibiotic even if you start to feel better.  Take over-the-counter and prescription medicines only as told by your health care provider.  Avoid loud noises.  Return to your normal activities as told by your health care provider. Ask your health care provider what activities are safe for you.  Keep all follow-up visits as told by your health care provider. This is important. Contact a health care provider if:  You feel dizzy.  You develop new symptoms.  You vomit or feel nauseous.  You have a fever. Get help right away if:  You develop sudden changes in your vision.  You have severe ear pain.  You have new or increased weakness.  You have a severe headache. This information is not intended to replace advice given to you by your health care provider. Make sure you discuss any questions you have with your health care provider. Document Released: 10/19/2005 Document Revised: 03/26/2016 Document Reviewed: 03/06/2015 Elsevier Interactive Patient Education  2018 [employer].

## 2018-11-01 ENCOUNTER — Other Ambulatory Visit: Payer: Self-pay | Admitting: Family Medicine

## 2018-11-28 ENCOUNTER — Other Ambulatory Visit: Payer: Self-pay

## 2018-11-28 ENCOUNTER — Encounter: Payer: Self-pay | Admitting: Family Medicine

## 2018-11-28 ENCOUNTER — Ambulatory Visit (INDEPENDENT_AMBULATORY_CARE_PROVIDER_SITE_OTHER): Payer: Medicare HMO | Admitting: Family Medicine

## 2018-11-28 VITALS — BP 122/68 | HR 92 | Temp 98.2°F | Ht 64.5 in | Wt 137.4 lb

## 2018-11-28 DIAGNOSIS — M542 Cervicalgia: Secondary | ICD-10-CM | POA: Diagnosis not present

## 2018-11-28 MED ORDER — BACLOFEN 10 MG PO TABS: 10.0000 mg | ORAL_TABLET | Freq: Every day | ORAL | 0 refills | Status: DC

## 2018-11-28 NOTE — Patient Instructions (Signed)
Try some moist heat and muscle massage.

## 2018-11-28 NOTE — Progress Notes (Signed)
Subjective:     Patient ID: Tonya Hamilton, female   DOB: 05/17/1946, 73 y.o.   MRN: [id number]  HPI Patient is seen with left-sided neck pain and upper back pain with occasional radiation toward the head going on for several weeks now.  Denies any recent injury.  She feels some increased muscle tension when moving the neck such as neck flexion.  She is developing some decreased range of motion.  She states she had x-rays of the neck years ago which showed significant spurring.  Denies any left upper extremity pain.  She has tried multiple things including Advil and Aleve without much relief.  She took tramadol previously but had nausea with that.  Pain is consistently worse with neck flexion.  Past Medical History:  Diagnosis Date  . Arthritis   . Colon polyps   . Diverticulitis   . Hyperlipidemia   . Hypertension   . Hypothyroidism   . PONV (postoperative nausea and vomiting)   . Thyroid disease   . UTI (urinary tract infection)    Past Surgical History:  Procedure Laterality Date  . ABDOMINAL HYSTERECTOMY  1993   TAH, endometriosis  . CHOLECYSTECTOMY  2002  . TOTAL HIP ARTHROPLASTY Right 05/31/2013   Procedure: RIGHT TOTAL HIP ARTHROPLASTY ANTERIOR APPROACH;  Surgeon: Gearlean Alf, MD;  Location: [facility];  Service: Orthopedics;  Laterality: Right;    reports that she has never smoked. She has never used smokeless tobacco. She reports that she does not drink alcohol or use drugs. family history includes Arthritis in her mother; Heart disease in her father and mother; Hypertension in her father and mother; Stroke in her father. Allergies  Allergen Reactions  . Diclofenac   . Lisinopril Swelling  . Simvastatin   . Statins     Body aches all over     Review of Systems  Constitutional: Negative for appetite change and unexpected weight change.  Respiratory: Negative for shortness of breath.   Cardiovascular: Negative for chest pain.  Musculoskeletal: Positive for neck  pain and neck stiffness.  Neurological: Negative for dizziness, weakness and numbness.       Objective:   Physical Exam Constitutional:      Appearance: She is well-developed.  HENT:     Head:     Comments: No evidence for any left mastoid swelling, warmth, or redness Cardiovascular:     Rate and Rhythm: Normal rate and regular rhythm.  Pulmonary:     Effort: Pulmonary effort is normal.     Breath sounds: Normal breath sounds. No wheezing or rales.  Musculoskeletal:     Comments: She has limited range of motion with lateral bending or rotation to the right or left.  She has fairly limited range of motion with neck extension as well.  She has significant palpable muscle tension and soreness including left trapezius and left paracervical muscles.  Lymphadenopathy:     Cervical: No cervical adenopathy.  Neurological:     Mental Status: She is alert.     Cranial Nerves: No cranial nerve deficit.     Motor: No weakness.     Deep Tendon Reflexes: Reflexes normal.        Assessment:     Patient presents with somewhat chronic left upper back and neck pain.  No radiculitis symptoms currently.  Nonfocal neuro exam    Plan:     -Recommend conservative measures with moist heat and muscle massage -She will consider topical sports cream along with muscle massage -  Wrote for limited baclofen 10 mg nightly -Schedule follow-up in 2 weeks to reassess.  Consider neck films to further evaluate if not improving -We also discussed possible trial of physical therapy if not improving at follow-up  [name] MD [facility] at [facility]

## 2018-12-12 ENCOUNTER — Ambulatory Visit (INDEPENDENT_AMBULATORY_CARE_PROVIDER_SITE_OTHER): Payer: Medicare HMO | Admitting: Family Medicine

## 2018-12-12 ENCOUNTER — Encounter: Payer: Self-pay | Admitting: Family Medicine

## 2018-12-12 ENCOUNTER — Other Ambulatory Visit: Payer: Self-pay

## 2018-12-12 VITALS — BP 120/74 | HR 72 | Temp 98.2°F | Ht 64.5 in | Wt 133.7 lb

## 2018-12-12 DIAGNOSIS — N3090 Cystitis, unspecified without hematuria: Secondary | ICD-10-CM

## 2018-12-12 DIAGNOSIS — M549 Dorsalgia, unspecified: Secondary | ICD-10-CM | POA: Diagnosis not present

## 2018-12-12 DIAGNOSIS — R300 Dysuria: Secondary | ICD-10-CM | POA: Diagnosis not present

## 2018-12-12 LAB — POCT URINALYSIS DIPSTICK
Bilirubin, UA: NEGATIVE
Blood, UA: NEGATIVE
Glucose, UA: NEGATIVE
Ketones, UA: NEGATIVE
NITRITE UA: NEGATIVE
PROTEIN UA: POSITIVE — AB
Spec Grav, UA: 1.015 (ref 1.010–1.025)
Urobilinogen, UA: 0.2 E.U./dL
pH, UA: 6 (ref 5.0–8.0)

## 2018-12-12 MED ORDER — CEPHALEXIN 500 MG PO CAPS: 500.0000 mg | ORAL_CAPSULE | Freq: Three times a day (TID) | ORAL | 0 refills | Status: DC

## 2018-12-12 NOTE — Patient Instructions (Signed)

## 2018-12-12 NOTE — Progress Notes (Signed)
Subjective:     Patient ID: Tonya Hamilton, female   DOB: [birth year], 73 y.o.   MRN: [id number]  HPI Patient is here for follow-up regarding recent left-sided neck pain and upper back pain.  She only took 1 dose of baclofen but had some dizziness the next day and has not take any further doses.  She has tried conservative things with heat and some topical rubs.  She does feel improved somewhat.  No further headaches.  No radiculitis symptoms.  No upper extremity numbness or weakness.  She has new problem of onset about 5 days ago of some burning with urination frequency.  Past history of frequent UTI.  She has done very well in the past year with trimethoprim prophylaxis.  No fevers or chills.  No flank pain.  No nausea or vomiting.  No gross hematuria.  Past Medical History:  Diagnosis Date  . Arthritis   . Colon polyps   . Diverticulitis   . Hyperlipidemia   . Hypertension   . Hypothyroidism   . PONV (postoperative nausea and vomiting)   . Thyroid disease   . UTI (urinary tract infection)    Past Surgical History:  Procedure Laterality Date  . ABDOMINAL HYSTERECTOMY  1993   TAH, endometriosis  . CHOLECYSTECTOMY  2002  . TOTAL HIP ARTHROPLASTY Right 05/31/2013   Procedure: RIGHT TOTAL HIP ARTHROPLASTY ANTERIOR APPROACH;  Surgeon: Gearlean Alf, MD;  Location: [facility];  Service: Orthopedics;  Laterality: Right;    reports that she has never smoked. She has never used smokeless tobacco. She reports that she does not drink alcohol or use drugs. family history includes Arthritis in her mother; Heart disease in her father and mother; Hypertension in her father and mother; Stroke in her father. Allergies  Allergen Reactions  . Diclofenac   . Lisinopril Swelling  . Simvastatin   . Statins     Body aches all over      Review of Systems  Constitutional: Negative for appetite change, chills and fever.  Gastrointestinal: Negative for abdominal pain, constipation, diarrhea, nausea  and vomiting.  Genitourinary: Positive for dysuria and frequency.  Musculoskeletal: Negative for back pain.  Neurological: Negative for dizziness.       Objective:   Physical Exam Constitutional:      Appearance: She is well-developed.  HENT:     Head: Normocephalic and atraumatic.  Neck:     Musculoskeletal: Neck supple.     Thyroid: No thyromegaly.  Cardiovascular:     Rate and Rhythm: Normal rate and regular rhythm.     Heart sounds: Normal heart sounds.  Pulmonary:     Breath sounds: Normal breath sounds.  Abdominal:     General: Bowel sounds are normal.     Palpations: Abdomen is soft.     Tenderness: There is no abdominal tenderness.        Assessment:     #1 left-sided neck and upper back pain improved.  No radiculitis symptoms.  #2 probable recurrent UTI    Plan:     -Patient initially gave insufficient volume for culture and she is trying to give another specimen and will try to send culture  -Start Keflex 500 mg 3 times daily for 7 days  -Drink plenty of fluids  -Discussed possible physical therapy for her neck and upper back pain but at this point she would like to observe since she has seen some improvement  [name] MD [facility] at [facility]

## 2018-12-13 LAB — URINE CULTURE
MICRO NUMBER:: 173350
Result:: NO GROWTH
SPECIMEN QUALITY:: ADEQUATE

## 2018-12-14 ENCOUNTER — Telehealth: Payer: Self-pay | Admitting: Family Medicine

## 2018-12-14 NOTE — Telephone Encounter (Signed)
Patient saw MyChart message showing the urine culture was negative. She continues to have burning, backache and frequency and would like to know if the Dr. Geraldine Solar her to take the Keflex he prescribed. Routing to PCP

## 2018-12-14 NOTE — Telephone Encounter (Signed)
Please see messages. Please advise. °

## 2018-12-14 NOTE — Telephone Encounter (Signed)
Copied from [place] [id number]. Topic: Quick Communication - See Telephone Encounter >> Dec 14, 2018 11:47 AM Bea Graff, NT wrote: CRM for notification. See Telephone encounter for: 12/14/18. Pt would like a call from a nurse to discuss her lab results as she is not able to get in her mychart.

## 2018-12-14 NOTE — Telephone Encounter (Signed)
Since culture negative, no need to finish the Keflex. Verify if she has taken topical estrogen in the past.  Wonder if she has some atropic vaginitis.

## 2018-12-14 NOTE — Telephone Encounter (Signed)
Called patient and left a detailed message on voicemail on urine culture results.  Ok for [facility] to discuss lab results / PCP recommendations/ schedule patient appointment.  Per Dr. Elease Hashimoto: Urine cx came back negative.   MyChart message sent and CRM Created in case patient calls back.

## 2018-12-15 NOTE — Telephone Encounter (Signed)
TC to patient. No answer. Left VM with Dr.Burchette's comment regarding the antibiotic-No need to finish the Keflex.

## 2018-12-20 ENCOUNTER — Other Ambulatory Visit: Payer: Self-pay | Admitting: Family Medicine

## 2019-01-05 ENCOUNTER — Other Ambulatory Visit: Payer: Self-pay | Admitting: Family Medicine

## 2019-01-06 NOTE — Telephone Encounter (Signed)
Refill OK.  She is taking this as prophylaxis for UTI.

## 2019-01-06 NOTE — Telephone Encounter (Signed)
OK to continue this medication? 

## 2019-04-05 DIAGNOSIS — H353130 Nonexudative age-related macular degeneration, bilateral, stage unspecified: Secondary | ICD-10-CM | POA: Diagnosis not present

## 2019-04-20 DIAGNOSIS — J309 Allergic rhinitis, unspecified: Secondary | ICD-10-CM | POA: Diagnosis not present

## 2019-04-20 DIAGNOSIS — M1990 Unspecified osteoarthritis, unspecified site: Secondary | ICD-10-CM | POA: Diagnosis not present

## 2019-04-20 DIAGNOSIS — R69 Illness, unspecified: Secondary | ICD-10-CM | POA: Diagnosis not present

## 2019-04-20 DIAGNOSIS — E039 Hypothyroidism, unspecified: Secondary | ICD-10-CM | POA: Diagnosis not present

## 2019-04-20 DIAGNOSIS — I10 Essential (primary) hypertension: Secondary | ICD-10-CM | POA: Diagnosis not present

## 2019-04-20 DIAGNOSIS — N390 Urinary tract infection, site not specified: Secondary | ICD-10-CM | POA: Diagnosis not present

## 2019-04-20 DIAGNOSIS — K219 Gastro-esophageal reflux disease without esophagitis: Secondary | ICD-10-CM | POA: Diagnosis not present

## 2019-04-20 DIAGNOSIS — Z823 Family history of stroke: Secondary | ICD-10-CM | POA: Diagnosis not present

## 2019-04-20 DIAGNOSIS — Z791 Long term (current) use of non-steroidal anti-inflammatories (NSAID): Secondary | ICD-10-CM | POA: Diagnosis not present

## 2019-04-20 DIAGNOSIS — G8929 Other chronic pain: Secondary | ICD-10-CM | POA: Diagnosis not present

## 2019-04-26 ENCOUNTER — Other Ambulatory Visit: Payer: Self-pay | Admitting: Family Medicine

## 2019-05-02 ENCOUNTER — Other Ambulatory Visit: Payer: Self-pay | Admitting: Family Medicine

## 2019-05-28 ENCOUNTER — Other Ambulatory Visit: Payer: Self-pay | Admitting: Family Medicine

## 2019-05-29 NOTE — Telephone Encounter (Signed)
OK to continue this medication? Last filled 11/28/18, # 30 with 0 refills. Last OV 12/12/18

## 2019-05-29 NOTE — Telephone Encounter (Signed)
Refill once OK. 

## 2019-06-22 ENCOUNTER — Other Ambulatory Visit: Payer: Self-pay | Admitting: Family Medicine

## 2019-06-23 NOTE — Telephone Encounter (Signed)
OK to continue? 

## 2019-06-23 NOTE — Telephone Encounter (Signed)
Refill once OK. 

## 2019-07-18 ENCOUNTER — Other Ambulatory Visit: Payer: Self-pay | Admitting: Family Medicine

## 2019-07-18 NOTE — Telephone Encounter (Signed)
OK to continue these? Refills?

## 2019-07-18 NOTE — Telephone Encounter (Signed)
Refill with one additional refill. 

## 2019-07-21 NOTE — Telephone Encounter (Signed)
Requested medication (s) are due for refill today: yes  Requested medication (s) are on the active medication list: yes   Last refill:  05/02/2019  Future visit scheduled: no  Notes to clinic:  Review for refill  Requested Prescriptions  Pending Prescriptions Disp Refills   trimethoprim (TRIMPEX) 100 MG tablet [Pharmacy Med Name: TRIMETHOPRIM 100 MG TABLET] 90 tablet 0    Sig: TAKE 1 TABLET BY MOUTH EVERY DAY     Off-Protocol Failed - 07/21/2019  9:35 AM      Failed - Medication not assigned to a protocol, review manually.      Passed - Valid encounter within last 12 months    Recent Outpatient Visits          7 months ago [place] at [facility], Alinda Sierras, MD   7 months ago Neck pain on left side   Therapist, music at [facility], Alinda Sierras, MD   1 year ago [place] at [facility], Alinda Sierras, MD   1 year ago Osteopenia, unspecified location   [facility] at [facility], Alinda Sierras, MD   1 year ago Acute frontal sinusitis, recurrence not specified   Therapist, music at [facility], Alinda Sierras, MD              amLODipine ([place]) 5 MG tablet [Pharmacy Med Name: AMLODIPINE BESYLATE 5 MG TAB] 90 tablet 0    Sig: TAKE 1 TABLET BY MOUTH EVERY DAY     Cardiovascular:  Calcium Channel Blockers Failed - 07/21/2019  9:35 AM      Failed - Valid encounter within last 6 months    Recent Outpatient Visits          7 months ago [place] at [facility], Alinda Sierras, MD   7 months ago Neck pain on left side   Therapist, music at [facility], Alinda Sierras, MD   1 year ago [place] at [facility], Alinda Sierras, MD   1 year ago Osteopenia, unspecified location   [employer] [facility] at [facility], Alinda Sierras, MD   1 year ago Acute frontal sinusitis, recurrence not specified   Therapist, music at [facility], Alinda Sierras, MD              Passed - Last BP in normal range    BP Readings from Last 1 Encounters:  12/12/18 120/74          levothyroxine (SYNTHROID) 25 MCG tablet [Pharmacy Med Name: LEVOTHYROXINE 25 MCG TABLET] 90 tablet 0    Sig: TAKE 1 TABLET BY MOUTH EVERY DAY     Endocrinology:  Hypothyroid Agents Failed - 07/21/2019  9:35 AM      Failed - TSH needs to be rechecked within 3 months after an abnormal result. Refill until TSH is due.      Failed - TSH in normal range and within 360 days    TSH  Date Value Ref Range Status  07/18/2018 2.35 0.35 - 4.50 uIU/mL Final         Passed - Valid encounter within last 12 months    Recent Outpatient Visits          7 months ago [place] [facility] [place], MD   7 months ago Neck pain on left side   Therapist, music at [place], MD   1 year ago Palpitations  Therapist, music at [facility], Alinda Sierras, MD   1 year ago Osteopenia, unspecified location   [employer] [facility] at [facility], Alinda Sierras, MD   1 year ago Acute frontal sinusitis, recurrence not specified   Therapist, music at [facility], Alinda Sierras, MD

## 2019-07-21 NOTE — Telephone Encounter (Signed)
Pt calling to check on the status of medication.  States that she is very concerned especially about the trimethoprim (TRIMPEX) 100 MG tablet because she doesn't have enough to get her through the weekend.

## 2019-07-22 ENCOUNTER — Ambulatory Visit (INDEPENDENT_AMBULATORY_CARE_PROVIDER_SITE_OTHER): Payer: Medicare HMO

## 2019-07-22 DIAGNOSIS — Z23 Encounter for immunization: Secondary | ICD-10-CM

## 2019-07-26 ENCOUNTER — Telehealth: Payer: Self-pay

## 2019-07-26 NOTE — Telephone Encounter (Signed)
Called patient and gave her the message from Dr. Burchette. Patient verbalized an understanding. 

## 2019-07-26 NOTE — Telephone Encounter (Signed)
Sometimes these medications get put on automatic refill from pharmacy.  We certainly do not recommend that she take this chronically.  If she is only taking 1 at night and there is really no need to taper

## 2019-07-26 NOTE — Telephone Encounter (Signed)
Called patient and let her know that they are for muscle spasms and she wants to come off of this and she read that she needs to taper off of these since she has been taking for a while.  Please advise how to taper off of the Baclofen

## 2019-07-26 NOTE — Telephone Encounter (Signed)
Copied from [place] [id number]. Topic: General - Other >> Jul 26, 2019 11:12 AM Carolyn Stare wrote: Pt said she has been question as to why she is taking the below medication and would like a call back , she said she does not have what the people say as to why she would be on the medication  baclofen (LIORESAL) 10 MG tablet

## 2019-08-16 ENCOUNTER — Other Ambulatory Visit: Payer: Self-pay | Admitting: Family Medicine

## 2019-08-17 NOTE — Telephone Encounter (Signed)
Please see telephone notes 07-26-19.   The pharmacy needs to quit sending this request.  This makes about the third time!

## 2019-08-17 NOTE — Telephone Encounter (Signed)
Okay to refill? 

## 2019-09-08 DIAGNOSIS — H524 Presbyopia: Secondary | ICD-10-CM | POA: Diagnosis not present

## 2019-09-19 DIAGNOSIS — H57813 Brow ptosis, bilateral: Secondary | ICD-10-CM | POA: Diagnosis not present

## 2019-09-19 DIAGNOSIS — H0235 Blepharochalasis left lower eyelid: Secondary | ICD-10-CM | POA: Diagnosis not present

## 2019-09-19 DIAGNOSIS — H0234 Blepharochalasis left upper eyelid: Secondary | ICD-10-CM | POA: Diagnosis not present

## 2019-09-19 DIAGNOSIS — D485 Neoplasm of uncertain behavior of skin: Secondary | ICD-10-CM | POA: Diagnosis not present

## 2019-09-19 DIAGNOSIS — H0231 Blepharochalasis right upper eyelid: Secondary | ICD-10-CM | POA: Diagnosis not present

## 2019-09-19 DIAGNOSIS — H0232 Blepharochalasis right lower eyelid: Secondary | ICD-10-CM | POA: Diagnosis not present

## 2019-09-24 NOTE — Telephone Encounter (Signed)
Pt no longer takes this medication so no refills needed..   I am not sure why pharmacy keeps requesting.   Thought they (pharmac) had already been contacted.

## 2019-10-11 ENCOUNTER — Other Ambulatory Visit: Payer: Self-pay | Admitting: Family Medicine

## 2019-10-11 NOTE — Telephone Encounter (Signed)
Patient has a follow up with labs scheduled on Friday for her medication refills.

## 2019-10-12 ENCOUNTER — Other Ambulatory Visit: Payer: Self-pay

## 2019-10-13 ENCOUNTER — Ambulatory Visit (INDEPENDENT_AMBULATORY_CARE_PROVIDER_SITE_OTHER): Payer: Medicare HMO | Admitting: Family Medicine

## 2019-10-13 ENCOUNTER — Encounter: Payer: Self-pay | Admitting: Family Medicine

## 2019-10-13 ENCOUNTER — Other Ambulatory Visit: Payer: Self-pay

## 2019-10-13 VITALS — BP 132/74 | HR 96 | Temp 97.5°F | Ht 64.5 in | Wt 139.6 lb

## 2019-10-13 DIAGNOSIS — R519 Headache, unspecified: Secondary | ICD-10-CM

## 2019-10-13 DIAGNOSIS — I10 Essential (primary) hypertension: Secondary | ICD-10-CM

## 2019-10-13 DIAGNOSIS — E039 Hypothyroidism, unspecified: Secondary | ICD-10-CM

## 2019-10-13 DIAGNOSIS — N390 Urinary tract infection, site not specified: Secondary | ICD-10-CM | POA: Diagnosis not present

## 2019-10-13 LAB — TSH: TSH: 2.12 u[IU]/mL (ref 0.35–4.50)

## 2019-10-13 MED ORDER — AMLODIPINE BESYLATE 5 MG PO TABS: 5.0000 mg | ORAL_TABLET | Freq: Every day | ORAL | 3 refills | Status: DC

## 2019-10-13 MED ORDER — TRIMETHOPRIM 100 MG PO TABS: 100.0000 mg | ORAL_TABLET | Freq: Every day | ORAL | 3 refills | Status: DC

## 2019-10-13 MED ORDER — LEVOTHYROXINE SODIUM 25 MCG PO TABS: 25.0000 ug | ORAL_TABLET | Freq: Every day | ORAL | 3 refills | Status: DC

## 2019-10-13 NOTE — Progress Notes (Signed)
Subjective:     Patient ID: Tonya Hamilton, female   DOB: 1946/10/06, 73 y.o.   MRN: [id number]  HPI   Tonya Hamilton is seen for medical follow-up.  She needs refills of several medications today including amlodipine, levothyroxine, and trimethoprim  History of recurrent UTI.  She has done very well since starting trimethoprim.  Her blood pressures been well controlled on amlodipine.  No side effects.  She has hypothyroidism on replacement and is due for follow-up labs.  Generally feels well.  Her main complaint is that she has had some persistent occipital headaches left-sided.  These generally are several days per week.  They seem to start in the occipital area and spread anterior.  She has not had any focal neurologic symptoms otherwise.  She tried multiple things including muscle relaxer and topical rubs and massage and heat without improvement.  She describes a sharp pain which is intermittent  No hx of migraines.  No associated nausea or vomiting.  Non-exertional.     Past Medical History:  Diagnosis Date  . Arthritis   . Colon polyps   . Diverticulitis   . Hyperlipidemia   . Hypertension   . Hypothyroidism   . PONV (postoperative nausea and vomiting)   . Thyroid disease   . UTI (urinary tract infection)    Past Surgical History:  Procedure Laterality Date  . ABDOMINAL HYSTERECTOMY  1993   TAH, endometriosis  . CHOLECYSTECTOMY  2002  . TOTAL HIP ARTHROPLASTY Right 05/31/2013   Procedure: RIGHT TOTAL HIP ARTHROPLASTY ANTERIOR APPROACH;  Surgeon: Gearlean Alf, MD;  Location: [facility];  Service: Orthopedics;  Laterality: Right;    reports that she has never smoked. She has never used smokeless tobacco. She reports that she does not drink alcohol or use drugs. family history includes Arthritis in her mother; Heart disease in her father and mother; Hypertension in her father and mother; Stroke in her father. Allergies  Allergen Reactions  . Diclofenac   . Lisinopril Swelling  .  Simvastatin   . Statins     Body aches all over  . Sulfa Antibiotics Itching     Review of Systems  Constitutional: Negative for fatigue.  Eyes: Negative for visual disturbance.  Respiratory: Negative for cough, chest tightness, shortness of breath and wheezing.   Cardiovascular: Negative for chest pain, palpitations and leg swelling.  Neurological: Positive for headaches. Negative for dizziness, seizures, syncope, weakness and light-headedness.       Objective:   Physical Exam Vitals reviewed.  Constitutional:      Appearance: She is well-developed.  Eyes:     Pupils: Pupils are equal, round, and reactive to light.  Neck:     Thyroid: No thyromegaly.     Vascular: No JVD.  Cardiovascular:     Rate and Rhythm: Normal rate and regular rhythm.     Heart sounds: No gallop.   Pulmonary:     Effort: Pulmonary effort is normal. No respiratory distress.     Breath sounds: Normal breath sounds. No wheezing or rales.  Musculoskeletal:     Cervical back: Neck supple.  Neurological:     Mental Status: She is alert.        Assessment:     #1 hypothyroidism on replacement and due for follow-up labs  #2 hypertension stable and at goal  #3 history of recurrent UTI doing well on prophylaxis with low-dose trimethoprim  #4 persistent left-sided occipital headaches    Plan:     -Recheck  TSH -Refill several medications including amlodipine, levothyroxine, and trimethoprim -Set up referral to local neurologist regarding her persistent occipital headaches which have been unimproved with multiple conservative therapies as above  [name] MD [facility] at [facility]

## 2019-10-14 ENCOUNTER — Encounter: Payer: Self-pay | Admitting: Family Medicine

## 2019-11-27 ENCOUNTER — Other Ambulatory Visit: Payer: Self-pay

## 2019-11-27 ENCOUNTER — Encounter: Payer: Self-pay | Admitting: Neurology

## 2019-11-27 ENCOUNTER — Ambulatory Visit: Payer: Medicare HMO | Admitting: Neurology

## 2019-11-27 VITALS — BP 144/84 | HR 83 | Temp 97.6°F | Ht 64.5 in | Wt 138.4 lb

## 2019-11-27 DIAGNOSIS — M5481 Occipital neuralgia: Secondary | ICD-10-CM | POA: Diagnosis not present

## 2019-11-27 DIAGNOSIS — R519 Headache, unspecified: Secondary | ICD-10-CM

## 2019-11-27 DIAGNOSIS — M129 Arthropathy, unspecified: Secondary | ICD-10-CM

## 2019-11-27 DIAGNOSIS — J383 Other diseases of vocal cords: Secondary | ICD-10-CM

## 2019-11-27 DIAGNOSIS — M542 Cervicalgia: Secondary | ICD-10-CM

## 2019-11-27 DIAGNOSIS — G8929 Other chronic pain: Secondary | ICD-10-CM | POA: Insufficient documentation

## 2019-11-27 DIAGNOSIS — R292 Abnormal reflex: Secondary | ICD-10-CM

## 2019-11-27 MED ORDER — IMIPRAMINE HCL 10 MG PO TABS: ORAL_TABLET | ORAL | 5 refills | Status: DC

## 2019-11-27 NOTE — Addendum Note (Signed)
Addended by: Britt Bottom on: 11/27/2019 08:34 PM   Modules accepted: Level of Service

## 2019-11-27 NOTE — Progress Notes (Addendum)
[facility]  PATIENT: Tonya Hamilton DOB: Dec 22, 1945  REFERRING DOCTOR OR PCP: Carolann Littler, MD SOURCE: Patient, notes from primary care  _________________________________   HISTORICAL  CHIEF COMPLAINT:  Chief Complaint  Patient presents with  . New Patient (Initial Visit)    RM 13, alone. Internal referral for headaches. Started this past year. Thought it sinus related at first but sx kept getting worse. She has arthritis, has been told she has bone spurs in shoulder.     HISTORY OF PRESENT ILLNESS:  I had the pleasure of seeing your patient, Tonya Hamilton, at [facility] for neurologic consultation regarding her headaches and neck pain.  She is a 74 year old woman with a left sided headache that started Fall 2019 but worsened over summer/fall 2020.   Initially allergies were suspected to play a role but allergy medications did not help.   Headaches are daily and radiate from the left occiput to the temple region.   Looking to the left worsens the pain.    A rolled up blanket or pillow reduces the pain when she lays down.   She notes reduced ROM in her neck.   She takes Excedrin or Advil 2 x daily with benefit.   She has not been on any prophylactic agents.     She also has shoulder pain and has been told she has arthritis there.   She also has arthritis in hr hands.  She has no neck or head maging studies.     She feels gait is fine.   She denies urinary dysfunction.     She also has spasmodic dysphonia and saw ENT at [facility].  Botox caused her to have too much weakness and she has not done again.   She felt she couldn't breath well for 2 months.     REVIEW OF SYSTEMS: Constitutional: No fevers, chills, sweats, or change in appetite Eyes: No visual changes, double vision, eye pain Ear, nose and throat: No hearing loss, ear pain, nasal congestion, sore throat.  She has spasmodic dysphonia. Cardiovascular: No chest pain,  palpitations Respiratory: No shortness of breath at rest or with exertion.   No wheezes GastrointestinaI: No nausea, vomiting, diarrhea, abdominal pain, fecal incontinence Genitourinary: No dysuria, urinary retention or frequency.  No nocturia. Musculoskeletal:As above.  Arthritis multiple joints.  She has osteopenia. Integumentary: No rash, pruritus, skin lesions Neurological: as above Psychiatric: No depression at this time.  No anxiety Endocrine: No palpitations, diaphoresis, change in appetite, change in weigh or increased thirst Hematologic/Lymphatic: No anemia, purpura, petechiae. Allergic/Immunologic: No itchy/runny eyes, nasal congestion, recent allergic reactions, rashes  ALLERGIES: Allergies  Allergen Reactions  . Botox [Onabotulinumtoxina]     Was given too much botox and at night, felt like her throat was closing up.   . Diclofenac   . Lisinopril Swelling  . Simvastatin   . Statins     Body aches all over  . Sulfa Antibiotics Itching    HOME MEDICATIONS:  Current Outpatient Medications:  .  amLODipine (NORVASC) 5 MG tablet, Take 1 tablet (5 mg total) by mouth daily., Disp: 90 tablet, Rfl: 3 .  Cholecalciferol 1000 units capsule, Take by mouth., Disp: , Rfl:  .  levothyroxine (SYNTHROID) 25 MCG tablet, Take 1 tablet (25 mcg total) by mouth daily., Disp: 90 tablet, Rfl: 3 .  Multiple Vitamins-Minerals (CENTRUM SILVER PO), Take by mouth., Disp: , Rfl:  .  Multiple Vitamins-Minerals (OCUVITE ADULT 50+ PO), Take by mouth., Disp: ,  Rfl:  .  omeprazole (PRILOSEC) 20 MG capsule, Take 20 mg by mouth daily. , Disp: , Rfl:  .  trimethoprim (TRIMPEX) 100 MG tablet, Take 1 tablet (100 mg total) by mouth daily., Disp: 90 tablet, Rfl: 3  PAST MEDICAL HISTORY: Past Medical History:  Diagnosis Date  . Arthritis   . Colon polyps   . Diverticulitis   . Hyperlipidemia   . Hypertension   . Hypothyroidism   . PONV (postoperative nausea and vomiting)   . Thyroid disease   .  UTI (urinary tract infection)     PAST SURGICAL HISTORY: Past Surgical History:  Procedure Laterality Date  . ABDOMINAL HYSTERECTOMY  1993   TAH, endometriosis  . CHOLECYSTECTOMY  2002  . TOTAL HIP ARTHROPLASTY Right 05/31/2013   Procedure: RIGHT TOTAL HIP ARTHROPLASTY ANTERIOR APPROACH;  Surgeon: Gearlean Alf, MD;  Location: [facility];  Service: Orthopedics;  Laterality: Right;    FAMILY HISTORY: Family History  Problem Relation Age of Onset  . Arthritis Mother   . Hypertension Mother   . Heart disease Mother   . Hypertension Father   . Stroke Father   . Heart disease Father   . Heart Problems Maternal Grandfather     SOCIAL HISTORY:  Social History   Socioeconomic History  . Marital status: Married    Spouse name: Juanda Crumble   . Number of children: 2  . Years of education: 52  . Highest education level: Not on file  Occupational History  . Not on file  Tobacco Use  . Smoking status: Never Smoker  . Smokeless tobacco: Never Used  Substance and Sexual Activity  . Alcohol use: No  . Drug use: No  . Sexual activity: Not Currently  Other Topics Concern  . Not on file  Social History Narrative   2 cups coffee per day   Soda daily (1/day)   Lives with husband   Right handed   Social Determinants of Health   Financial Resource Strain:   . Difficulty of Paying Living Expenses: Not on file  Food Insecurity:   . Worried About Charity fundraiser in the Last Year: Not on file  . Ran Out of Food in the Last Year: Not on file  Transportation Needs:   . Lack of Transportation (Medical): Not on file  . Lack of Transportation (Non-Medical): Not on file  Physical Activity:   . Days of Exercise per Week: Not on file  . Minutes of Exercise per Session: Not on file  Stress:   . Feeling of Stress : Not on file  Social Connections:   . Frequency of Communication with Friends and Family: Not on file  . Frequency of Social Gatherings with Friends and Family: Not on file  .  Attends Religious Services: Not on file  . Active Member of Clubs or Organizations: Not on file  . Attends Archivist Meetings: Not on file  . Marital Status: Not on file  Intimate Partner Violence:   . Fear of Current or Ex-Partner: Not on file  . Emotionally Abused: Not on file  . Physically Abused: Not on file  . Sexually Abused: Not on file     PHYSICAL EXAM  Vitals:   11/27/19 1025  BP: (!) 144/84  Pulse: 83  Temp: 97.6 F (36.4 C)  Weight: 138 lb 6.4 oz (62.8 kg)  Height: 5' 4.5" (1.638 m)    Body mass index is 23.39 kg/m.   General: The patient is well-developed  and well-nourished and in no acute distress  HEENT:  Head is [place]/AT.  Sclera are anicteric.  Funduscopic exam shows normal optic discs and retinal vessels.  Neck: No carotid bruits are noted.  The neck is tender over the occiput, left greater than right.  There is reduced range of motion.  Cardiovascular: The heart has a regular rate and rhythm with a normal S1 and S2. There were no murmurs, gallops or rubs.    Skin: Extremities are without rash or  edema.  Musculoskeletal:  Back is nontender  Neurologic Exam  Mental status: The patient is alert and oriented x 3 at the time of the examination. The patient has apparent normal recent and remote memory, with an apparently normal attention span and concentration ability.   Speech is normal.  Cranial nerves: Extraocular movements are full. Pupils are equal, round, and reactive to light and accomodation.  Visual fields are full.  Facial symmetry is present. There is good facial sensation to soft touch bilaterally.Facial strength is normal.  Trapezius and sternocleidomastoid strength is normal. No dysarthria is noted.  The tongue is midline, and the patient has symmetric elevation of the soft palate. No obvious hearing deficits are noted.  Motor:  Muscle bulk is normal.   Tone is normal. Strength is  5 / 5 in all 4 extremities.   Sensory: Sensory  testing is intact to pinprick, soft touch and vibration sensation in all 4 extremities.  Coordination: Cerebellar testing reveals good finger-nose-finger and heel-to-shin bilaterally.  Gait and station: Station is normal.   Gait is normal. Tandem gait is mildly wide. Romberg is negative.   Reflexes: Deep tendon reflexes are symmetric and increased in the legs with spread at the knees.  No ankle clonus..   Plantar responses are flexor.    DIAGNOSTIC DATA (LABS, IMAGING, TESTING) - I reviewed patient records, labs, notes, testing and imaging myself where available.  Lab Results  Component Value Date   WBC 9.4 07/18/2018   HGB 13.1 07/18/2018   HCT 39.2 07/18/2018   MCV 88.8 07/18/2018   PLT 344.0 07/18/2018      Component Value Date/Time   NA 143 10/28/2017 0855   K 4.0 10/28/2017 0855   CL 104 10/28/2017 0855   CO2 31 10/28/2017 0855   GLUCOSE 96 10/28/2017 0855   BUN 12 10/28/2017 0855   CREATININE 0.78 10/28/2017 0855   CALCIUM 9.0 10/28/2017 0855   PROT 6.6 10/28/2017 0855   ALBUMIN 4.1 10/28/2017 0855   AST 13 10/28/2017 0855   ALT 17 10/28/2017 0855   ALKPHOS 71 10/28/2017 0855   BILITOT 0.5 10/28/2017 0855   GFRNONAA 70 (L) 06/02/2013 0420   GFRAA 82 (L) 06/02/2013 0420   Lab Results  Component Value Date   CHOL 190 10/28/2017   HDL 39.00 (L) 10/28/2017   LDLCALC 131 (H) 10/28/2017   TRIG 101.0 10/28/2017   CHOLHDL 5 10/28/2017    Lab Results  Component Value Date   TSH 2.12 10/13/2019       ASSESSMENT AND PLAN  Chronic intractable headache, unspecified headache type - Plan: Rheumatoid factor, Sedimentation rate, C-reactive protein  Occipital neuralgia of left side - Plan: MR CERVICAL SPINE WO CONTRAST  Neck pain - Plan: MR CERVICAL SPINE WO CONTRAST, Rheumatoid factor, Sedimentation rate, C-reactive protein  Hyperreflexia - Plan: MR CERVICAL SPINE WO CONTRAST  Arthritis, multiple joint involvement - Plan: Rheumatoid factor, Sedimentation rate,  C-reactive protein  Spasmodic dysphonia   In summary, Tonya Hamilton is  a 74 year old woman with left-sided headache and neck pain.  On exam, she had tenderness over the left splenius capitis muscle and occipital nerve and hyperreflexia.   Due to the neck pain, reduced range of motion and hyperreflexia, we need to check a cervical spine MRI to determine if upper cervical spine degenerative changes may be contributing.     I will also check labs for RF, ESR, CRP to assess for rheumatoid arthritis and giant cell arteritis.   To help with the pain, I did a left splenius capitus trigger point injection/ occipital nerve block with 80 mg Depo-Medrol in 3 cc Marcaine.   She tolerated it well and there were no complications.  Pain was much improved afterwards and ROM in the neck improved.  I will also have her start imipramine for longer term prophylaxis.     We will let her know the results of the MRI and lab work.  She will return to see me based on results or if there are new or worsening neurologic symptoms.  Thank you for asking me to see Tonya Hamilton.  Please let me know if I can be of further assistance with her or other patients in the future.    Tatjana Turcott A. Felecia Shelling, MD, [facility] 3/90/5646, 98:06 AM Certified in Neurology, Clinical Neurophysiology, Sleep Medicine and Neuroimaging  [facility] [address], [place], [place] [address] [phone]

## 2019-11-28 ENCOUNTER — Telehealth: Payer: Self-pay | Admitting: *Deleted

## 2019-11-28 LAB — SEDIMENTATION RATE: Sed Rate: 3 mm/hr (ref 0–40)

## 2019-11-28 LAB — C-REACTIVE PROTEIN: CRP: 13 mg/L — ABNORMAL HIGH (ref 0–10)

## 2019-11-28 LAB — RHEUMATOID FACTOR: Rheumatoid fact SerPl-aCnc: <10 IU/mL (ref 0.0–13.9)

## 2019-11-28 NOTE — Telephone Encounter (Signed)
-----   Message from Britt Bottom, MD sent at 11/28/2019  8:34 AM EST ----- Please let her know that the lab work was okay.  The 1 test (CRP which measures information) was slightly high but not enough to be concerned about.  The rheumatoid arthritis test was negative.

## 2019-11-28 NOTE — Telephone Encounter (Signed)
I called and spoke with pt about results per Dr. Felecia Shelling note. She verbalized understanding. I also advised impramine required PA and it was approved.

## 2019-11-28 NOTE — Telephone Encounter (Signed)
PA imipramine submitted on CMM. Key: [id number]. Waiting on determination from [facility].

## 2019-11-28 NOTE — Telephone Encounter (Signed)
PA approved from 11/03/2019 - 11/01/2020

## 2019-12-21 ENCOUNTER — Other Ambulatory Visit: Payer: Medicare HMO

## 2019-12-21 ENCOUNTER — Telehealth: Payer: Self-pay | Admitting: Family Medicine

## 2019-12-21 ENCOUNTER — Other Ambulatory Visit: Payer: Self-pay | Admitting: Neurology

## 2019-12-21 NOTE — Telephone Encounter (Signed)
Called pt. No answer/VM full. Pt due to schedule Medicare Annual Wellness Visit (AWV) either virtually,audio only or in person (whichever the patient prefers--45 MINUTES).  Last AWV 12.4.19; please schedule at anytime with [facility]-Nurse Health Advisor at [facility] at [place].

## 2019-12-26 ENCOUNTER — Other Ambulatory Visit: Payer: Self-pay

## 2019-12-26 ENCOUNTER — Ambulatory Visit
Admission: RE | Admit: 2019-12-26 | Discharge: 2019-12-26 | Disposition: A | Discharge status: Home / Self Care | Payer: Medicare HMO | Source: Ambulatory Visit | Attending: Neurology | Admitting: Neurology

## 2019-12-26 DIAGNOSIS — M5481 Occipital neuralgia: Secondary | ICD-10-CM | POA: Diagnosis not present

## 2019-12-26 DIAGNOSIS — R292 Abnormal reflex: Secondary | ICD-10-CM | POA: Diagnosis not present

## 2019-12-26 DIAGNOSIS — M542 Cervicalgia: Secondary | ICD-10-CM | POA: Diagnosis not present

## 2019-12-28 ENCOUNTER — Telehealth: Payer: Self-pay | Admitting: Neurology

## 2019-12-28 MED ORDER — MELOXICAM 7.5 MG PO TABS: 7.5000 mg | ORAL_TABLET | Freq: Every day | ORAL | 5 refills | Status: DC

## 2019-12-28 NOTE — Telephone Encounter (Signed)
I spoke to Tonya Hamilton about the MRI of the cervical spine.  It does show severe degenerative changes with degenerative fusion of C5-C6, mild anterolisthesis of C3-C4 and C7-T1 and retrolisthesis of C6-C7.  Spinal stenosis at C5-C6 and C6-C7.  I discussed that the spinal stenosis could be playing a role in her increased reflexes.  I recommend her seeing a neurosurgeon but she states that she would not have surgery so would prefer not to see one at this time.  She will reconsider if symptoms got much worse.  The splenius capitis/occipital nerve block helped quite a bit for a few days but after a week the pain came back.  I will call in a prescription for meloxicam 7.5 mg.  In the chart there is a listing of an allergy to diclofenac but she reports that she has taken multiple anti-inflammatories over the years without any difficulties.

## 2020-01-10 ENCOUNTER — Other Ambulatory Visit: Payer: Medicare HMO

## 2020-02-01 DIAGNOSIS — D485 Neoplasm of uncertain behavior of skin: Secondary | ICD-10-CM | POA: Diagnosis not present

## 2020-02-01 DIAGNOSIS — L814 Other melanin hyperpigmentation: Secondary | ICD-10-CM | POA: Diagnosis not present

## 2020-05-15 ENCOUNTER — Other Ambulatory Visit: Payer: Self-pay

## 2020-05-15 ENCOUNTER — Encounter: Payer: Self-pay | Admitting: Family Medicine

## 2020-05-15 ENCOUNTER — Ambulatory Visit (INDEPENDENT_AMBULATORY_CARE_PROVIDER_SITE_OTHER): Payer: Medicare HMO | Admitting: Family Medicine

## 2020-05-15 VITALS — BP 132/68 | HR 95 | Temp 98.0°F | Wt 133.0 lb

## 2020-05-15 DIAGNOSIS — R519 Headache, unspecified: Secondary | ICD-10-CM

## 2020-05-15 DIAGNOSIS — R1319 Other dysphagia: Secondary | ICD-10-CM | POA: Diagnosis not present

## 2020-05-15 DIAGNOSIS — R21 Rash and other nonspecific skin eruption: Secondary | ICD-10-CM

## 2020-05-15 MED ORDER — CICLOPIROX OLAMINE 0.77 % EX CREA: TOPICAL_CREAM | Freq: Two times a day (BID) | CUTANEOUS | 1 refills | Status: DC

## 2020-05-15 NOTE — Progress Notes (Signed)
Established Patient Office Visit  Subjective:  Patient ID: Tonya Hamilton, female    DOB: [birth year]  Age: 74 y.o. MRN: [id number]  CC:  Chief Complaint  Patient presents with   Rash    on top right foot is not itchy but red, has been for about a month and spreading     HPI Tonya Hamilton presents for discussion of several items as below  She has basically asymptomatic foot rash right foot greater than left.  Noted on the medial aspect of the right foot and spreading dorsally and gradually spreading over the past month.  No pain.  No pruritus.  No change of shoewear.  She has not tried anything topically for this.  She has had some intermittent headaches.  These were unilateral and we sent her to neurology.  She had MRI of the neck which showed fairly severe degenerative changes at multiple levels.  Possible nerve impingement at a couple levels.  MRI results reviewed.  Basically she was told here was not much that could be done.  She takes Excedrin occasionally for headaches.  Tries to avoid regular use of nonsteroidals.  She had sed rate which was normal.  C-reactive protein minimally elevated.  Rheumatoid factor normal.  Headache is really not too severe at this time.  She is coping fairly well with the neck pain and does not have any major upper extremity weakness  Several months ago she developed some occasional pain with swallowing and even occasional dysphagia.  She does have occasional reflux symptoms.  She took generic PPI saw some improvement.  Still has occasional intermittent difficulties with things like bread.  Symptoms are not consistent.  She states her appetite is fair.  She has noted that Pepto-Bismol seems to help her symptoms.  She has had some mild recent weight loss but good appetite  Wt Readings from Last 3 Encounters:  05/15/20 133 lb (60.3 kg)  11/27/19 138 lb 6.4 oz (62.8 kg)  10/13/19 139 lb 9.6 oz (63.3 kg)     Past Medical History:  Diagnosis Date    Arthritis    Colon polyps    Diverticulitis    Hyperlipidemia    Hypertension    Hypothyroidism    PONV (postoperative nausea and vomiting)    Thyroid disease    UTI (urinary tract infection)     Past Surgical History:  Procedure Laterality Date   ABDOMINAL HYSTERECTOMY  1993   TAH, endometriosis   CHOLECYSTECTOMY  2002   TOTAL HIP ARTHROPLASTY Right 05/31/2013   Procedure: RIGHT TOTAL HIP ARTHROPLASTY ANTERIOR APPROACH;  Surgeon: Gearlean Alf, MD;  Location: [facility];  Service: Orthopedics;  Laterality: Right;    Family History  Problem Relation Age of Onset   Arthritis Mother    Hypertension Mother    Heart disease Mother    Hypertension Father    Stroke Father    Heart disease Father    Heart Problems Maternal Grandfather     Social History   Socioeconomic History   Marital status: Married    Spouse name: Tonya Hamilton    Number of children: 2   Years of education: 13   Highest education level: Not on file  Occupational History   Not on file  Tobacco Use   Smoking status: Never Smoker   Smokeless tobacco: Never Used  Vaping Use   Vaping Use: Never used  Substance and Sexual Activity   Alcohol use: No   Drug use: No  Sexual activity: Not Currently  Other Topics Concern   Not on file  Social History Narrative   2 cups coffee per day   Soda daily (1/day)   Lives with husband   Right handed   Social Determinants of Health   Financial Resource Strain:    Difficulty of Paying Living Expenses:   Food Insecurity:    Worried About Charity fundraiser in the Last Year:    Arboriculturist in the Last Year:   Transportation Needs:    Film/video editor (Medical):    Lack of Transportation (Non-Medical):   Physical Activity:    Days of Exercise per Week:    Minutes of Exercise per Session:   Stress:    Feeling of Stress :   Social Connections:    Frequency of Communication with Friends and Family:    Frequency of  Social Gatherings with Friends and Family:    Attends Religious Services:    Active Member of Clubs or Organizations:    Attends Music therapist:    Marital Status:   Intimate Partner Violence:    Fear of Current or Ex-Partner:    Emotionally Abused:    Physically Abused:    Sexually Abused:     Outpatient Medications Prior to Visit  Medication Sig Dispense Refill   amLODipine (NORVASC) 5 MG tablet Take 1 tablet (5 mg total) by mouth daily. 90 tablet 3   Cholecalciferol 1000 units capsule Take by mouth.     imipramine (TOFRANIL) 10 MG tablet TAKE 1-2 TABLETS BY MOUTH AT BEDTIME 180 tablet 2   levothyroxine (SYNTHROID) 25 MCG tablet Take 1 tablet (25 mcg total) by mouth daily. 90 tablet 3   meloxicam (MOBIC) 7.5 MG tablet Take 1 tablet (7.5 mg total) by mouth daily. 30 tablet 5   Multiple Vitamins-Minerals (CENTRUM SILVER PO) Take by mouth.     Multiple Vitamins-Minerals (OCUVITE ADULT 50+ PO) Take by mouth.     omeprazole (PRILOSEC) 20 MG capsule Take 20 mg by mouth daily.      trimethoprim (TRIMPEX) 100 MG tablet Take 1 tablet (100 mg total) by mouth daily. 90 tablet 3   No facility-administered medications prior to visit.    Allergies  Allergen Reactions   Botox [Onabotulinumtoxina]     Was given too much botox and at night, felt like her throat was closing up.    Diclofenac    Lisinopril Swelling   Simvastatin    Statins     Body aches all over   Sulfa Antibiotics Itching    ROS Review of Systems  Constitutional: Negative for chills and fever.  Respiratory: Negative for cough and shortness of breath.   Cardiovascular: Negative for chest pain.  Gastrointestinal: Negative for abdominal pain.  Musculoskeletal: Positive for neck pain and neck stiffness.  Skin: Positive for rash.      Objective:    Physical Exam Vitals reviewed.  Constitutional:      Appearance: Normal appearance.  Cardiovascular:     Rate and Rhythm: Normal  rate and regular rhythm.  Pulmonary:     Effort: Pulmonary effort is normal.     Breath sounds: Normal breath sounds.  Abdominal:     General: There is no distension.     Palpations: Abdomen is soft. There is no mass.     Tenderness: There is no abdominal tenderness. There is no guarding or rebound.  Skin:    Findings: Rash present.  Comments: She has skin rash involving the right foot which is on the medial border the foot and spread somewhat dorsally.  This slightly erythematous and scaly with well-demarcated border.  No interdigital involvement  Neurological:     General: No focal deficit present.     Mental Status: She is alert.     Cranial Nerves: No cranial nerve deficit.     BP 132/68 (BP Location: Left Arm, Patient Position: Sitting, Cuff Size: Normal)    Pulse 95    Temp 98 F (36.7 C) (Oral)    Wt 133 lb (60.3 kg)    SpO2 97%    BMI 22.48 kg/m  Wt Readings from Last 3 Encounters:  05/15/20 133 lb (60.3 kg)  11/27/19 138 lb 6.4 oz (62.8 kg)  10/13/19 139 lb 9.6 oz (63.3 kg)     Health Maintenance Due  Topic Date Due   COVID-19 Vaccine (1) Never done   TETANUS/TDAP  11/15/2018   MAMMOGRAM  02/10/2020    There are no preventive care reminders to display for this patient.  Lab Results  Component Value Date   TSH 2.12 10/13/2019   Lab Results  Component Value Date   WBC 9.4 07/18/2018   HGB 13.1 07/18/2018   HCT 39.2 07/18/2018   MCV 88.8 07/18/2018   PLT 344.0 07/18/2018   Lab Results  Component Value Date   NA 143 10/28/2017   K 4.0 10/28/2017   CO2 31 10/28/2017   GLUCOSE 96 10/28/2017   BUN 12 10/28/2017   CREATININE 0.78 10/28/2017   BILITOT 0.5 10/28/2017   ALKPHOS 71 10/28/2017   AST 13 10/28/2017   ALT 17 10/28/2017   PROT 6.6 10/28/2017   ALBUMIN 4.1 10/28/2017   CALCIUM 9.0 10/28/2017   GFR 77.19 10/28/2017   Lab Results  Component Value Date   CHOL 190 10/28/2017   Lab Results  Component Value Date   HDL 39.00 (L)  10/28/2017   Lab Results  Component Value Date   LDLCALC 131 (H) 10/28/2017   Lab Results  Component Value Date   TRIG 101.0 10/28/2017   Lab Results  Component Value Date   CHOLHDL 5 10/28/2017   No results found for: HGBA1C    Assessment & Plan:   #1 right foot rash.  Suspect fungal. -Keep feet dry as possible -Recommend Loprox cream twice daily and be in touch if this is not resolving over the next few weeks.  #2 chronic cervical neck pain.  She has had some atypical unilateral headaches which neurology felt were related.  We discussed possible medication options such as gabapentin but at this point she declines  #3 intermittent dysphagia.  Symptoms currently improved.  We discussed indications for EGD including weight loss, loss of appetite, persistent symptoms.  At this point she feels improved  Meds ordered this encounter  Medications   ciclopirox (LOPROX) 0.77 % cream    Sig: Apply topically 2 (two) times daily.    Dispense:  15 g    Refill:  1    Follow-up: No follow-ups on file.    Carolann Littler, MD

## 2020-05-30 ENCOUNTER — Telehealth: Payer: Self-pay | Admitting: Family Medicine

## 2020-05-30 NOTE — Telephone Encounter (Signed)
Pt call and want a RX for Meloxicam.

## 2020-05-31 ENCOUNTER — Other Ambulatory Visit: Payer: Self-pay | Admitting: *Deleted

## 2020-05-31 MED ORDER — MELOXICAM 7.5 MG PO TABS: 7.5000 mg | ORAL_TABLET | Freq: Every day | ORAL | 1 refills | Status: DC

## 2020-05-31 NOTE — Telephone Encounter (Signed)
Looks like she has gotten this in the past from Dr. Felecia Shelling.  May refill meloxicam 7.5 mg 1 p.o. daily as needed for headaches #30 with 1 refill

## 2020-05-31 NOTE — Telephone Encounter (Signed)
Please advise we have not prescribed this before

## 2020-05-31 NOTE — Telephone Encounter (Signed)
Rx sent to the pharmacy as requested. ?

## 2020-06-06 ENCOUNTER — Other Ambulatory Visit: Payer: Self-pay

## 2020-06-06 ENCOUNTER — Encounter: Payer: Self-pay | Admitting: Family Medicine

## 2020-06-06 ENCOUNTER — Ambulatory Visit (INDEPENDENT_AMBULATORY_CARE_PROVIDER_SITE_OTHER): Payer: Medicare HMO | Admitting: Family Medicine

## 2020-06-06 VITALS — BP 110/70 | HR 90 | Temp 98.4°F | Wt 131.0 lb

## 2020-06-06 DIAGNOSIS — Z8719 Personal history of other diseases of the digestive system: Secondary | ICD-10-CM | POA: Diagnosis not present

## 2020-06-06 DIAGNOSIS — K529 Noninfective gastroenteritis and colitis, unspecified: Secondary | ICD-10-CM

## 2020-06-06 LAB — POC URINALSYSI DIPSTICK (AUTOMATED)
Bilirubin, UA: NEGATIVE
Blood, UA: NEGATIVE
Glucose, UA: NEGATIVE
Leukocytes, UA: NEGATIVE
Nitrite, UA: NEGATIVE
Protein, UA: NEGATIVE
Spec Grav, UA: 1.015 (ref 1.010–1.025)
Urobilinogen, UA: 0.2 E.U./dL
pH, UA: 6 (ref 5.0–8.0)

## 2020-06-06 MED ORDER — ONDANSETRON HCL 4 MG PO TABS: 4.0000 mg | ORAL_TABLET | Freq: Three times a day (TID) | ORAL | 0 refills | Status: DC | PRN

## 2020-06-06 NOTE — Progress Notes (Signed)
Subjective:    Patient ID: Tonya Hamilton, female    DOB: 03-Mar-1946, 74 y.o.   MRN: [id number]  No chief complaint on file.   HPI Pt is a 74 yo female with pmh sig for HTN, GERD, hypothyroidism, OA, h/o diverticulitis, HLD, anxiety was seen today for acute concern.  Patient endorses right sided lower abdominal pain and bilateral low back pain starting early Sunday a.m.  Pt notes eating a grilled chicken sandwich on Saturday but only eating a few bites as it "did not taste right".  Since the pain started patient endorses nausea, emesis on Sunday.   Pt did take 2 Dulcolax on Saturday.  Had 1-2 loose stools today and was able to eat chicken and will sleep.  Symptoms improving, but patient did not want to go into the weekend still being sick.  Patient states her 48th wedding anniversary is tomorrow.  Past Medical History:  Diagnosis Date  . Arthritis   . Colon polyps   . Diverticulitis   . Hyperlipidemia   . Hypertension   . Hypothyroidism   . PONV (postoperative nausea and vomiting)   . Thyroid disease   . UTI (urinary tract infection)     Allergies  Allergen Reactions  . Botox [Onabotulinumtoxina]     Was given too much botox and at night, felt like her throat was closing up.   . Diclofenac   . Lisinopril Swelling  . Simvastatin   . Statins     Body aches all over  . Sulfa Antibiotics Itching    ROS General: Denies fever, chills, night sweats, changes in weight, changes in appetite HEENT: Denies headaches, ear pain, changes in vision, rhinorrhea, sore throat CV: Denies CP, palpitations, SOB, orthopnea Pulm: Denies SOB, cough, wheezing GI: Denies constipation +abdominal pain, nausea, vomiting, diarrhea, flatus GU: Denies dysuria, hematuria, frequency, vaginal discharge Msk: Denies muscle cramps, joint pains  + bilateral low back pain Neuro: Denies weakness, numbness, tingling Skin: Denies rashes, bruising Psych: Denies depression, anxiety, hallucinations     Objective:     Blood pressure 110/70, pulse 90, temperature 98.4 F (36.9 C), temperature source Oral, weight 131 lb (59.4 kg), SpO2 96 %.   Gen. Pleasant, well-nourished, in no distress, normal affect   HEENT: [place]/AT, face symmetric, conjunctiva clear, no scleral icterus, PERRLA, EOMI, MMM, nares patent without drainage Lungs: no accessory muscle use, CTAB, no wheezes or rales Cardiovascular: RRR, no m/r/g, no peripheral edema Abdomen: BS present, soft, TTP in RLQ, ND, no hepatosplenomegaly. Musculoskeletal: No deformities, no cyanosis or clubbing, normal tone Neuro:  A&Ox3, CN II-XII intact, normal gait Skin:  Warm, no lesions/ rash   Wt Readings from Last 3 Encounters:  06/06/20 131 lb (59.4 kg)  05/15/20 133 lb (60.3 kg)  11/27/19 138 lb 6.4 oz (62.8 kg)    Lab Results  Component Value Date   WBC 9.4 07/18/2018   HGB 13.1 07/18/2018   HCT 39.2 07/18/2018   PLT 344.0 07/18/2018   GLUCOSE 96 10/28/2017   CHOL 190 10/28/2017   TRIG 101.0 10/28/2017   HDL 39.00 (L) 10/28/2017   LDLCALC 131 (H) 10/28/2017   ALT 17 10/28/2017   AST 13 10/28/2017   NA 143 10/28/2017   K 4.0 10/28/2017   CL 104 10/28/2017   CREATININE 0.78 10/28/2017   BUN 12 10/28/2017   CO2 31 10/28/2017   TSH 2.12 10/13/2019   INR 0.88 05/25/2013    Assessment/Plan:  Gastroenteritis  -Given history also consider diverticulitis flare. -Will  obtain labs -Patient encouraged to eat a bland diet.  Advance as tolerated.  For continued or worsening symptoms tomorrow will start antibiotics (Cipro and Flagyl). - Plan: CMP with eGFR([facility]), CBC with Differential/Platelets, Lipase, ondansetron (ZOFRAN) 4 MG tablet, POCT Urinalysis Dipstick (Automated)  History of diverticulitis  F/u as needed  Grier Mitts, MD

## 2020-06-06 NOTE — Addendum Note (Signed)
Addended by: Wyvonne Lenz on: 06/06/2020 04:33 PM   Modules accepted: Orders

## 2020-06-06 NOTE — Patient Instructions (Signed)
Diverticulitis  Diverticulitis is infection or inflammation of small pouches (diverticula) in the colon that form due to a condition called diverticulosis. Diverticula can trap stool (feces) and bacteria, causing infection and inflammation. Diverticulitis may cause severe stomach pain and diarrhea. It may lead to tissue damage in the colon that causes bleeding. The diverticula may also burst (rupture) and cause infected stool to enter other areas of the abdomen. Complications of diverticulitis can include:  Bleeding.  Severe infection.  Severe pain.  Rupture (perforation) of the colon.  Blockage (obstruction) of the colon. What are the causes? This condition is caused by stool becoming trapped in the diverticula, which allows bacteria to grow in the diverticula. This leads to inflammation and infection. What increases the risk? You are more likely to develop this condition if:  You have diverticulosis. The risk for diverticulosis increases if: ? You are overweight or obese. ? You use tobacco products. ? You do not get enough exercise.  You eat a diet that does not include enough fiber. High-fiber foods include fruits, vegetables, beans, nuts, and whole grains. What are the signs or symptoms? Symptoms of this condition may include:  Pain and tenderness in the abdomen. The pain is normally located on the left side of the abdomen, but it may occur in other areas.  Fever and chills.  Bloating.  Cramping.  Nausea.  Vomiting.  Changes in bowel routines.  Blood in your stool. How is this diagnosed? This condition is diagnosed based on:  Your medical history.  A physical exam.  Tests to make sure there is nothing else causing your condition. These tests may include: ? Blood tests. ? Urine tests. ? Imaging tests of the abdomen, including X-rays, ultrasounds, MRIs, or CT scans. How is this treated? Most cases of this condition are mild and can be treated at home.  Treatment may include:  Taking over-the-counter pain medicines.  Following a clear liquid diet.  Taking antibiotic medicines by mouth.  Rest. More severe cases may need to be treated at a hospital. Treatment may include:  Not eating or drinking.  Taking prescription pain medicine.  Receiving antibiotic medicines through an IV tube.  Receiving fluids and nutrition through an IV tube.  Surgery. When your condition is under control, your health care provider may recommend that you have a colonoscopy. This is an exam to look at the entire large intestine. During the exam, a lubricated, bendable tube is inserted into the anus and then passed into the rectum, colon, and other parts of the large intestine. A colonoscopy can show how severe your diverticula are and whether something else may be causing your symptoms. Follow these instructions at home: Medicines  Take over-the-counter and prescription medicines only as told by your health care provider. These include fiber supplements, probiotics, and stool softeners.  If you were prescribed an antibiotic medicine, take it as told by your health care provider. Do not stop taking the antibiotic even if you start to feel better.  Do not drive or use heavy machinery while taking prescription pain medicine. General instructions   Follow a full liquid diet or another diet as directed by your health care provider. After your symptoms improve, your health care provider may tell you to change your diet. He or she may recommend that you eat a diet that contains at least 25 g (25 grams) of fiber daily. Fiber makes it easier to pass stool. Healthy sources of fiber include: ? Berries. One cup contains 4-8 grams of  fiber. ? Beans or lentils. One half cup contains 5-8 grams of fiber. ? Green vegetables. One cup contains 4 grams of fiber.  Exercise for at least 30 minutes, 3 times each week. You should exercise hard enough to raise your heart rate and  break a sweat.  Keep all follow-up visits as told by your health care provider. This is important. You may need a colonoscopy. Contact a health care provider if:  Your pain does not improve.  You have a hard time drinking or eating food.  Your bowel movements do not return to normal. Get help right away if:  Your pain gets worse.  Your symptoms do not get better with treatment.  Your symptoms suddenly get worse.  You have a fever.  You vomit more than one time.  You have stools that are bloody, black, or tarry. Summary  Diverticulitis is infection or inflammation of small pouches (diverticula) in the colon that form due to a condition called diverticulosis. Diverticula can trap stool (feces) and bacteria, causing infection and inflammation.  You are at higher risk for this condition if you have diverticulosis and you eat a diet that does not include enough fiber.  Most cases of this condition are mild and can be treated at home. More severe cases may need to be treated at a hospital.  When your condition is under control, your health care provider may recommend that you have an exam called a colonoscopy. This exam can show how severe your diverticula are and whether something else may be causing your symptoms. This information is not intended to replace advice given to you by your health care provider. Make sure you discuss any questions you have with your health care provider. Document Revised: 10/01/2017 Document Reviewed: 11/21/2016 Elsevier Patient Education  2020 [place] Diet A bland diet consists of foods that are often soft and do not have a lot of fat, fiber, or extra seasonings. Foods without fat, fiber, or seasoning are easier for the body to digest. They are also less likely to irritate your mouth, throat, stomach, and other parts of your digestive system. A bland diet is sometimes called a BRAT diet. What is my plan? Your health care provider or food  and nutrition specialist (dietitian) may recommend specific changes to your diet to prevent symptoms or to treat your symptoms. These changes may include:  Eating small meals often.  Cooking food until it is soft enough to chew easily.  Chewing your food well.  Drinking fluids slowly.  Not eating foods that are very spicy, sour, or fatty.  Not eating citrus fruits, such as oranges and grapefruit. What do I need to know about this diet?  Eat a variety of foods from the bland diet food list.  Do not follow a bland diet longer than needed.  Ask your health care provider whether you should take vitamins or supplements. What foods can I eat? Grains  Hot cereals, such as cream of wheat. Rice. Bread, crackers, or tortillas made from refined white flour. Vegetables Canned or cooked vegetables. Mashed or boiled potatoes. Fruits  Bananas. Applesauce. Other types of cooked or canned fruit with the skin and seeds removed, such as canned peaches or pears. Meats and other proteins  Scrambled eggs. Creamy peanut butter or other nut butters. Lean, well-cooked meats, such as chicken or fish. Tofu. Soups or broths. Dairy Low-fat dairy products, such as milk, cottage cheese, or yogurt. Beverages  Water. Herbal tea. Apple juice. Fats  and oils Mild salad dressings. Canola or olive oil. Sweets and desserts Pudding. Custard. Fruit gelatin. Ice cream. The items listed above may not be a complete list of recommended foods and beverages. Contact a dietitian for more options. What foods are not recommended? Grains Whole grain breads and cereals. Vegetables Raw vegetables. Fruits Raw fruits, especially citrus, berries, or dried fruits. Dairy Whole fat dairy foods. Beverages Caffeinated drinks. Alcohol. Seasonings and condiments Strongly flavored seasonings or condiments. Hot sauce. Salsa. Other foods Spicy foods. Fried foods. Sour foods, such as pickled or fermented foods. Foods with high  sugar content. Foods high in fiber. The items listed above may not be a complete list of foods and beverages to avoid. Contact a dietitian for more information. Summary  A bland diet consists of foods that are often soft and do not have a lot of fat, fiber, or extra seasonings.  Foods without fat, fiber, or seasoning are easier for the body to digest.  Check with your health care provider to see how long you should follow this diet plan. It is not meant to be followed for long periods. This information is not intended to replace advice given to you by your health care provider. Make sure you discuss any questions you have with your health care provider. Document Revised: 11/17/2017 Document Reviewed: 11/17/2017 Elsevier Patient Education  2020 [employer].

## 2020-06-07 ENCOUNTER — Telehealth: Payer: Self-pay | Admitting: Family Medicine

## 2020-06-07 DIAGNOSIS — K5792 Diverticulitis of intestine, part unspecified, without perforation or abscess without bleeding: Secondary | ICD-10-CM

## 2020-06-07 LAB — COMPLETE METABOLIC PANEL WITH GFR
AG Ratio: 1.7 (calc) (ref 1.0–2.5)
ALT: 20 U/L (ref 6–29)
AST: 20 U/L (ref 10–35)
Albumin: 4.1 g/dL (ref 3.6–5.1)
Alkaline phosphatase (APISO): 62 U/L (ref 37–153)
BUN: 9 mg/dL (ref 7–25)
CO2: 30 mmol/L (ref 20–32)
Calcium: 9.2 mg/dL (ref 8.6–10.4)
Chloride: 102 mmol/L (ref 98–110)
Creat: 0.7 mg/dL (ref 0.60–0.93)
GFR, Est African American: 99 mL/min/{1.73_m2} (ref >=60)
GFR, Est Non African American: 85 mL/min/{1.73_m2} (ref >=60)
Globulin: 2.4 g/dL (calc) (ref 1.9–3.7)
Glucose, Bld: 108 mg/dL — ABNORMAL HIGH (ref 65–99)
Potassium: 3.7 mmol/L (ref 3.5–5.3)
Sodium: 139 mmol/L (ref 135–146)
Total Bilirubin: 0.3 mg/dL (ref 0.2–1.2)
Total Protein: 6.5 g/dL (ref 6.1–8.1)

## 2020-06-07 LAB — CBC WITH DIFFERENTIAL/PLATELET
Absolute Monocytes: 893 cells/uL (ref 200–950)
Basophils Absolute: 38 cells/uL (ref 0–200)
Basophils Relative: 0.4 %
Eosinophils Absolute: 67 cells/uL (ref 15–500)
Eosinophils Relative: 0.7 %
HCT: 39.4 % (ref 35.0–45.0)
Hemoglobin: 13.1 g/dL (ref 11.7–15.5)
Lymphs Abs: 1536 cells/uL (ref 850–3900)
MCH: 29.6 pg (ref 27.0–33.0)
MCHC: 33.2 g/dL (ref 32.0–36.0)
MCV: 89.1 fL (ref 80.0–100.0)
MPV: 11.3 fL (ref 7.5–12.5)
Monocytes Relative: 9.3 %
Neutro Abs: 7066 cells/uL (ref 1500–7800)
Neutrophils Relative %: 73.6 %
Platelets: 459 10*3/uL — ABNORMAL HIGH (ref 140–400)
RBC: 4.42 10*6/uL (ref 3.80–5.10)
RDW: 13.3 % (ref 11.0–15.0)
Total Lymphocyte: 16 %
WBC: 9.6 10*3/uL (ref 3.8–10.8)

## 2020-06-07 LAB — LIPASE: Lipase: 11 U/L (ref 7–60)

## 2020-06-07 MED ORDER — METRONIDAZOLE 500 MG PO TABS: 500.0000 mg | ORAL_TABLET | Freq: Three times a day (TID) | ORAL | 0 refills | Status: AC

## 2020-06-07 MED ORDER — CIPROFLOXACIN HCL 500 MG PO TABS: 500.0000 mg | ORAL_TABLET | Freq: Two times a day (BID) | ORAL | 0 refills | Status: AC

## 2020-06-07 NOTE — Telephone Encounter (Signed)
Late entry: Pt called by this provider for f/u.  Pt notes improvement in symptoms today.  Endorses trying to eat bland foods.  Tried applesauce last night which "did not go well".  Caused loose stools immediately after eating and abdominal discomfort.  Pt notes increased flatus today and less nausea after taking Zofran.  Pt and her husband are celebrating their 5th wedding anniversary today.  Pt states her seafood dinner will have to wait until she is feeling better.  We'll send in Rx for Cipro and Flagyl in case symptoms become worse over the weekend.  Patient encouraged to continue bland diet and advance slowly as tolerated.  Given precautions.  Grier Mitts, MD

## 2020-06-10 ENCOUNTER — Other Ambulatory Visit: Payer: Self-pay

## 2020-06-10 ENCOUNTER — Emergency Department (HOSPITAL_COMMUNITY): Payer: Medicare HMO

## 2020-06-10 ENCOUNTER — Encounter (HOSPITAL_COMMUNITY): Payer: Self-pay

## 2020-06-10 ENCOUNTER — Emergency Department (HOSPITAL_COMMUNITY)
Admission: EM | Admit: 2020-06-10 | Discharge: 2020-06-10 | Disposition: A | Discharge status: Home / Self Care | Payer: Medicare HMO | Attending: Emergency Medicine | Admitting: Emergency Medicine

## 2020-06-10 DIAGNOSIS — Z79899 Other long term (current) drug therapy: Secondary | ICD-10-CM | POA: Diagnosis not present

## 2020-06-10 DIAGNOSIS — I700 Atherosclerosis of aorta: Secondary | ICD-10-CM | POA: Diagnosis not present

## 2020-06-10 DIAGNOSIS — R5383 Other fatigue: Secondary | ICD-10-CM | POA: Diagnosis not present

## 2020-06-10 DIAGNOSIS — E039 Hypothyroidism, unspecified: Secondary | ICD-10-CM | POA: Insufficient documentation

## 2020-06-10 DIAGNOSIS — R110 Nausea: Secondary | ICD-10-CM | POA: Diagnosis not present

## 2020-06-10 DIAGNOSIS — K5730 Diverticulosis of large intestine without perforation or abscess without bleeding: Secondary | ICD-10-CM | POA: Diagnosis not present

## 2020-06-10 DIAGNOSIS — Z7982 Long term (current) use of aspirin: Secondary | ICD-10-CM | POA: Diagnosis not present

## 2020-06-10 DIAGNOSIS — K219 Gastro-esophageal reflux disease without esophagitis: Secondary | ICD-10-CM | POA: Insufficient documentation

## 2020-06-10 DIAGNOSIS — R1111 Vomiting without nausea: Secondary | ICD-10-CM | POA: Diagnosis not present

## 2020-06-10 DIAGNOSIS — R197 Diarrhea, unspecified: Secondary | ICD-10-CM | POA: Diagnosis not present

## 2020-06-10 DIAGNOSIS — K760 Fatty (change of) liver, not elsewhere classified: Secondary | ICD-10-CM | POA: Diagnosis not present

## 2020-06-10 DIAGNOSIS — R531 Weakness: Secondary | ICD-10-CM | POA: Diagnosis not present

## 2020-06-10 DIAGNOSIS — R112 Nausea with vomiting, unspecified: Secondary | ICD-10-CM | POA: Diagnosis not present

## 2020-06-10 DIAGNOSIS — R1084 Generalized abdominal pain: Secondary | ICD-10-CM | POA: Diagnosis not present

## 2020-06-10 DIAGNOSIS — I10 Essential (primary) hypertension: Secondary | ICD-10-CM | POA: Diagnosis not present

## 2020-06-10 DIAGNOSIS — Z79890 Hormone replacement therapy (postmenopausal): Secondary | ICD-10-CM | POA: Diagnosis not present

## 2020-06-10 DIAGNOSIS — I959 Hypotension, unspecified: Secondary | ICD-10-CM | POA: Diagnosis not present

## 2020-06-10 LAB — CBC WITH DIFFERENTIAL/PLATELET
Abs Immature Granulocytes: 0.22 10*3/uL — ABNORMAL HIGH (ref 0.00–0.07)
Basophils Absolute: 0.1 10*3/uL (ref 0.0–0.1)
Basophils Relative: 0 %
Eosinophils Absolute: 0 10*3/uL (ref 0.0–0.5)
Eosinophils Relative: 0 %
HCT: 43.6 % (ref 36.0–46.0)
Hemoglobin: 14.4 g/dL (ref 12.0–15.0)
Immature Granulocytes: 1 %
Lymphocytes Relative: 3 %
Lymphs Abs: 0.9 10*3/uL (ref 0.7–4.0)
MCH: 30.1 pg (ref 26.0–34.0)
MCHC: 33 g/dL (ref 30.0–36.0)
MCV: 91 fL (ref 80.0–100.0)
Monocytes Absolute: 2.1 10*3/uL — ABNORMAL HIGH (ref 0.1–1.0)
Monocytes Relative: 7 %
Neutro Abs: 26.2 10*3/uL — ABNORMAL HIGH (ref 1.7–7.7)
Neutrophils Relative %: 89 %
Platelets: 462 10*3/uL — ABNORMAL HIGH (ref 150–400)
RBC: 4.79 MIL/uL (ref 3.87–5.11)
RDW: 14.4 % (ref 11.5–15.5)
WBC: 29.5 10*3/uL — ABNORMAL HIGH (ref 4.0–10.5)
nRBC: 0 % (ref 0.0–0.2)

## 2020-06-10 LAB — COMPREHENSIVE METABOLIC PANEL
ALT: 25 U/L (ref 0–44)
AST: 25 U/L (ref 15–41)
Albumin: 4.1 g/dL (ref 3.5–5.0)
Alkaline Phosphatase: 62 U/L (ref 38–126)
Anion gap: 12 (ref 5–15)
BUN: 8 mg/dL (ref 8–23)
CO2: 24 mmol/L (ref 22–32)
Calcium: 8.4 mg/dL — ABNORMAL LOW (ref 8.9–10.3)
Chloride: 105 mmol/L (ref 98–111)
Creatinine, Ser: 0.78 mg/dL (ref 0.44–1.00)
GFR calc Af Amer: >60 mL/min (ref >60)
GFR calc non Af Amer: >60 mL/min (ref >60)
Glucose, Bld: 140 mg/dL — ABNORMAL HIGH (ref 70–99)
Potassium: 2.9 mmol/L — ABNORMAL LOW (ref 3.5–5.1)
Sodium: 141 mmol/L (ref 135–145)
Total Bilirubin: 0.5 mg/dL (ref 0.3–1.2)
Total Protein: 6.8 g/dL (ref 6.5–8.1)

## 2020-06-10 LAB — URINALYSIS, ROUTINE W REFLEX MICROSCOPIC
Bacteria, UA: NONE SEEN
Bilirubin Urine: NEGATIVE
Glucose, UA: NEGATIVE mg/dL
Ketones, ur: 20 mg/dL — AB
Nitrite: NEGATIVE
Protein, ur: NEGATIVE mg/dL
Specific Gravity, Urine: 1.031 — ABNORMAL HIGH (ref 1.005–1.030)
pH: 5 (ref 5.0–8.0)

## 2020-06-10 LAB — LIPASE, BLOOD: Lipase: 24 U/L (ref 11–51)

## 2020-06-10 MED ORDER — POTASSIUM CHLORIDE 10 MEQ/100ML IV SOLN
10.0000 meq | INTRAVENOUS | Status: AC
  Administered 2020-06-10 (×2): 10 meq via INTRAVENOUS
  Filled 2020-06-10 (×2): qty 100

## 2020-06-10 MED ORDER — ONDANSETRON 4 MG PO TBDP
4.0000 mg | ORAL_TABLET | Freq: Three times a day (TID) | ORAL | 0 refills | Status: DC | PRN
Start: 2020-06-10 — End: 2020-07-23

## 2020-06-10 MED ORDER — IOHEXOL 300 MG/ML  SOLN
100.0000 mL | Freq: Once | INTRAMUSCULAR | Status: AC | PRN
  Administered 2020-06-10: 100 mL via INTRAVENOUS

## 2020-06-10 MED ORDER — SODIUM CHLORIDE 0.9 % IV BOLUS
1000.0000 mL | Freq: Once | INTRAVENOUS | Status: AC
  Administered 2020-06-10: 1000 mL via INTRAVENOUS

## 2020-06-10 MED ORDER — PROBIOTIC 1-250 BILLION-MG PO CAPS
1.0000 | ORAL_CAPSULE | Freq: Every day | ORAL | 0 refills | Status: DC
Start: 2020-06-10 — End: 2021-12-23

## 2020-06-10 MED ORDER — POTASSIUM CHLORIDE CRYS ER 20 MEQ PO TBCR
40.0000 meq | EXTENDED_RELEASE_TABLET | Freq: Once | ORAL | Status: AC
  Administered 2020-06-10: 40 meq via ORAL
  Filled 2020-06-10: qty 2

## 2020-06-10 NOTE — Discharge Instructions (Signed)
1. Medications: Take Zofran as needed for nausea.  Let this medicine dissolve under your tongue and wait around 20 minutes before eating or drinking after taking this medication.  Start taking a probiotic and/or eating yogurt daily to help replenish the good bacteria in the gut. 2. Treatment: rest, drink plenty of fluids, advance diet slowly.  See the attached information regarding food choices to help relieve diarrhea. 3. Follow Up: Please followup with your primary doctor in 3 days for discussion of your diagnoses and further evaluation after today's visit; if you do not have a primary care doctor use the resource guide provided to find one; Please return to the ER for persistent vomiting, high fevers or worsening symptoms

## 2020-06-10 NOTE — ED Triage Notes (Signed)
Pt came via EMS c/o N/V/D x8 days. Pt receieved 4mg  zofran and 531ml NaCl PTA.

## 2020-06-10 NOTE — ED Provider Notes (Signed)
[facility] DEPT Provider Note   CSN: [id number] Arrival date & time: 06/10/20  1436     History Chief Complaint  Patient presents with  . Emesis    Tonya Hamilton is a 74 y.o. female with history of diverticulitis hypertension, hyperlipidemia, thyroid disease presents for evaluation of acute onset, persistent and progressively worsening, nausea, vomiting, diarrhea for 9 days.  Symptoms began after eating a few bites of chicken "that did not taste right".  She reports multiple episodes of nonbloody nonbilious emesis and watery nonbloody diarrhea daily since that time.  She was seen by her PCP on 06/06/2020 4 days ago who drew labs.  The following day her PCP checked in on her over the phone and at that time her symptoms had improved a little bit.  She sent a prescription for Cipro and Flagyl in the event that her symptoms worsened to worsen given the patient's history of diverticulitis and the patient started taking the antibiotics on Saturday so has had about 2.5 days worth of antibiotics.  She has also been trying to take Zofran but states that she still is not able to keep down most food and fluids.  Denies fevers, chest pain, shortness of breath, or urinary symptoms.  She has had intermittent upper abdominal discomfort that improved after EMS administered IV fluids and Zofran.  She denies recent travel and does not think she has been treated with any antibiotics recently.  The history is provided by the patient.       Past Medical History:  Diagnosis Date  . Arthritis   . Colon polyps   . Diverticulitis   . Hyperlipidemia   . Hypertension   . Hypothyroidism   . PONV (postoperative nausea and vomiting)   . Thyroid disease   . UTI (urinary tract infection)     Patient Active Problem List   Diagnosis Date Noted  . Chronic intractable headache 11/27/2019  . Occipital neuralgia of left side 11/27/2019  . Neck pain 11/27/2019  . Hyperreflexia  11/27/2019  . Arthritis, multiple joint involvement 11/27/2019  . Spasmodic dysphonia 11/27/2019  . Angioedema 11/06/2014  . Osteoarthritis of right hip 02/20/2013  . History of colonic diverticulitis 11/15/2012  . Osteoarthritis 11/15/2012  . Hypertension 11/15/2012  . Hyperlipidemia 11/15/2012  . Hypothyroid 11/15/2012  . GERD (gastroesophageal reflux disease) 11/15/2012  . Osteopenia 11/15/2012  . Anxiety 11/15/2012    Past Surgical History:  Procedure Laterality Date  . ABDOMINAL HYSTERECTOMY  1993   TAH, endometriosis  . CHOLECYSTECTOMY  2002  . TOTAL HIP ARTHROPLASTY Right 05/31/2013   Procedure: RIGHT TOTAL HIP ARTHROPLASTY ANTERIOR APPROACH;  Surgeon: Gearlean Alf, MD;  Location: [facility];  Service: Orthopedics;  Laterality: Right;     OB History   No obstetric history on file.     Family History  Problem Relation Age of Onset  . Arthritis Mother   . Hypertension Mother   . Heart disease Mother   . Hypertension Father   . Stroke Father   . Heart disease Father   . Heart Problems Maternal Grandfather     Social History   Tobacco Use  . Smoking status: Never Smoker  . Smokeless tobacco: Never Used  Vaping Use  . Vaping Use: Never used  Substance Use Topics  . Alcohol use: No  . Drug use: No    Home Medications Prior to Admission medications   Medication Sig Start Date End Date Taking? Authorizing Provider  amLODipine (NORVASC) 5  MG tablet Take 1 tablet (5 mg total) by mouth daily. 10/13/19  Yes Burchette, Alinda Sierras, MD  aspirin-acetaminophen-caffeine (EXCEDRIN MIGRAINE) [phone] MG tablet Take 2 tablets by mouth every 6 (six) hours as needed for headache.   Yes [provider]  bismuth subsalicylate (PEPTO BISMOL) 262 MG/15ML suspension Take 30 mLs by mouth every 6 (six) hours as needed for indigestion or diarrhea or loose stools.   Yes [provider]  ciprofloxacin (CIPRO) 500 MG tablet Take 1 tablet (500 mg total) by mouth 2 (two)  times daily for 7 days. 06/07/20 06/14/20 Yes Billie Ruddy, MD  diphenhydramine-acetaminophen (TYLENOL PM) 25-500 MG TABS tablet Take 2 tablets by mouth at bedtime as needed (sleep/pain).   Yes [provider]  fluticasone (FLONASE) 50 MCG/ACT nasal spray Place 1-2 sprays into both nostrils daily as needed for allergies or rhinitis.   Yes [provider]  ibuprofen (ADVIL) 200 MG tablet Take 400 mg by mouth every 6 (six) hours as needed for headache or mild pain.   Yes [provider]  levothyroxine (SYNTHROID) 25 MCG tablet Take 1 tablet (25 mcg total) by mouth daily. 10/13/19  Yes Burchette, Alinda Sierras, MD  metroNIDAZOLE (FLAGYL) 500 MG tablet Take 1 tablet (500 mg total) by mouth 3 (three) times daily for 7 days. 06/07/20 06/14/20 Yes Billie Ruddy, MD  ondansetron (ZOFRAN) 4 MG tablet Take 1 tablet (4 mg total) by mouth every 8 (eight) hours as needed for nausea or vomiting. 06/06/20  Yes Billie Ruddy, MD  trimethoprim (TRIMPEX) 100 MG tablet Take 1 tablet (100 mg total) by mouth daily. 10/13/19  Yes Burchette, Alinda Sierras, MD  Bacillus Coagulans-Inulin (PROBIOTIC) 1-250 BILLION-MG CAPS Take 1 capsule by mouth daily. 06/10/20   Haddon Fyfe A, PA-C  ciclopirox (LOPROX) 0.77 % cream Apply topically 2 (two) times daily. Patient not taking: Reported on 06/10/2020 05/15/20   [name], MD  imipramine (TOFRANIL) 10 MG tablet TAKE 1-2 TABLETS BY MOUTH AT BEDTIME Patient not taking: Reported on 06/10/2020 12/21/19   Sater, Nanine Means, MD  meloxicam (MOBIC) 7.5 MG tablet Take 1 tablet (7.5 mg total) by mouth daily. 05/31/20   Burchette, Alinda Sierras, MD  ondansetron (ZOFRAN ODT) 4 MG disintegrating tablet Take 1 tablet (4 mg total) by mouth every 8 (eight) hours as needed for nausea or vomiting. 06/10/20   Rodell Perna A, PA-C    Allergies    Botox [onabotulinumtoxina], Diclofenac, Lisinopril, Simvastatin, Statins, and Sulfa antibiotics  Review of Systems   Review of Systems    Constitutional: Positive for fatigue. Negative for chills and fever.  Respiratory: Negative for cough and shortness of breath.   Cardiovascular: Negative for chest pain.  Gastrointestinal: Positive for abdominal pain, diarrhea, nausea and vomiting.  Genitourinary: Negative for dysuria, frequency, hematuria and urgency.  All other systems reviewed and are negative.   Physical Exam Updated Vital Signs BP (!) 117/59   Pulse (!) 102   Temp 98.2 F (36.8 C) (Oral)   Resp 18   Ht 5\' 4"  (1.626 m)   Wt 59 kg   SpO2 97%   BMI 22.31 kg/m   Physical Exam Vitals and nursing note reviewed.  Constitutional:      General: She is not in acute distress.    Appearance: She is well-developed.  HENT:     Head: Normocephalic and atraumatic.  Eyes:     General:        Right eye: No discharge.  Left eye: No discharge.     Conjunctiva/sclera: Conjunctivae normal.  Neck:     Vascular: No JVD.     Trachea: No tracheal deviation.  Cardiovascular:     Rate and Rhythm: Normal rate and regular rhythm.  Pulmonary:     Effort: Pulmonary effort is normal.     Breath sounds: Normal breath sounds.  Abdominal:     General: Abdomen is flat. Bowel sounds are increased. There is no distension.     Palpations: Abdomen is soft.     Tenderness: There is no abdominal tenderness. There is no right CVA tenderness, left CVA tenderness, guarding or rebound.  Musculoskeletal:     Cervical back: Neck supple.  Skin:    General: Skin is warm and dry.     Findings: No erythema.  Neurological:     Mental Status: She is alert.  Psychiatric:        Behavior: Behavior normal.     ED Results / Procedures / Treatments   Labs (all labs ordered are listed, but only abnormal results are displayed) Labs Reviewed  CBC WITH DIFFERENTIAL/PLATELET - Abnormal; Notable for the following components:      Result Value   WBC 29.5 (*)    Platelets 462 (*)    Neutro Abs 26.2 (*)    Monocytes Absolute 2.1 (*)     Abs Immature Granulocytes 0.22 (*)    All other components within normal limits  COMPREHENSIVE METABOLIC PANEL - Abnormal; Notable for the following components:   Potassium 2.9 (*)    Glucose, Bld 140 (*)    Calcium 8.4 (*)    All other components within normal limits  URINALYSIS, ROUTINE W REFLEX MICROSCOPIC - Abnormal; Notable for the following components:   Specific Gravity, Urine 1.031 (*)    Hgb urine dipstick MODERATE (*)    Ketones, ur 20 (*)    Leukocytes,Ua SMALL (*)    All other components within normal limits  GASTROINTESTINAL PANEL BY PCR, STOOL (REPLACES STOOL CULTURE)  C DIFFICILE QUICK SCREEN W PCR REFLEX  LIPASE, BLOOD    EKG None  Radiology CT ABDOMEN PELVIS W CONTRAST  Result Date: 06/10/2020 CLINICAL DATA:  Nausea, vomiting EXAM: CT ABDOMEN AND PELVIS WITH CONTRAST TECHNIQUE: Multidetector CT imaging of the abdomen and pelvis was performed using the standard protocol following bolus administration of intravenous contrast. CONTRAST:  154mL OMNIPAQUE IOHEXOL 300 MG/ML  SOLN COMPARISON:  None. FINDINGS: Lower chest: Lung bases are clear. No effusions. Heart is normal size. Hepatobiliary: Prior cholecystectomy. Diffuse fatty infiltration of the liver. No focal hepatic abnormality. Common bile duct is prominent measuring 12 mm, likely related to post cholecystectomy state. Pancreas: No focal abnormality or ductal dilatation. Spleen: No focal abnormality.  Normal size. Adrenals/Urinary Tract: No adrenal abnormality. No focal renal abnormality. No stones or hydronephrosis. Urinary bladder is unremarkable. Stomach/Bowel: Diffuse colonic diverticulosis. No active diverticulitis. Stomach and small bowel decompressed. Duodenal diverticulum in the region of the 3rd portion of the duodenum. Vascular/Lymphatic: Aortic atherosclerosis. No evidence of aneurysm or adenopathy. Reproductive: Prior hysterectomy.  No adnexal masses. Other: No free fluid or free air. Musculoskeletal: Prior  right hip replacement. No acute bony abnormality. IMPRESSION: Mild diffuse fatty infiltration of the liver. Biliary ductal dilatation, likely related to post cholecystectomy state. Diffuse aortic atherosclerosis. Diffuse colonic diverticulosis. No acute findings. Electronically Signed   By: Rolm Baptise M.D.   On: 06/10/2020 17:39    Procedures Procedures (including critical care time)  Medications Ordered in ED Medications  sodium chloride 0.9 % bolus 1,000 mL (0 mLs Intravenous Stopped 06/10/20 1725)  potassium chloride 10 mEq in 100 mL IVPB (0 mEq Intravenous Stopped 06/10/20 1955)  iohexol (OMNIPAQUE) 300 MG/ML solution 100 mL (100 mLs Intravenous Contrast Given 06/10/20 1716)  potassium chloride SA (KLOR-CON) CR tablet 40 mEq (40 mEq Oral Given 06/10/20 1846)    ED Course  I have reviewed the triage vital signs and the nursing notes.  Pertinent labs & imaging results that were available during my care of the patient were reviewed by me and considered in my medical decision making (see chart for details).    MDM Rules/Calculators/A&P                          Patient presenting for evaluation of nausea, vomiting, diarrhea for about 8 days.  Has been seen and evaluated by her PCP for this, recently started a course of Cipro and Flagyl for presumed diverticulitis.  She is afebrile, initially mildly tachycardic with improvement on reevaluation.  She is nontoxic in appearance.  She does appear mildly dehydrated.  Abdomen soft with no tenderness, rebound or guarding.  Lab work reviewed and interpreted by myself shows no leukocytosis of 29.5, no anemia.  No renal insufficiency.  She is hypokalemic with a potassium of 2.9, likely in the setting of vomiting and diarrhea.  UA suggestive of dehydration, less so of UTI or nephrolithiasis.  Imaging today shows evidence of diffuse fatty infiltration of the liver and biliary ductal dilatation likely related to postcholecystectomy state.  Her LFTs are within  normal limits today.  On reevaluation she is resting comfortably in no distress.  She received IV fluids and p.o. potassium in the ED, Zofran with EMS.  She reports that she is feeling much better and feels comfortable with discharge home.  Serial abdominal examinations remain benign.  She was unable to give [place] a stool sample in the ED but we discussed the possibility of her PCP obtaining one to evaluate for potential infectious causes of her symptoms.  Suspect likely gastroenteritis.  No evidence of obstruction, perforation, appendicitis, or diverticulitis at this time.  She was instructed to discontinue her antibiotics due to concern for potential GI side effects from the antibiotics without indication to continue them at this time.  Discussed symptomatic management with Zofran, pushing fluids, advancing diet slowly.  We will also prescribe probiotics.  She will follow-up with her PCP for reevaluation of her symptoms.  Discuss strict ED return precautions.  Patient verbalized understanding of and agreement with plan and patient is stable for discharge at this time.     Final Clinical Impression(s) / ED Diagnoses Final diagnoses:  Nausea vomiting and diarrhea  Generalized abdominal pain    Rx / DC Orders ED Discharge Orders         Ordered    ondansetron (ZOFRAN ODT) 4 MG disintegrating tablet  Every 8 hours PRN     Discontinue  Reprint     06/10/20 1907    Bacillus Coagulans-Inulin (PROBIOTIC) 1-250 BILLION-MG CAPS  Daily     Discontinue  Reprint     06/10/20 1907           Renita Papa, PA-C 06/11/20 0036    Milton Ferguson, MD 06/11/20 [phone]

## 2020-06-10 NOTE — ED Notes (Signed)
Pt tolerated ginger ale and Crackers.

## 2020-06-17 ENCOUNTER — Ambulatory Visit (INDEPENDENT_AMBULATORY_CARE_PROVIDER_SITE_OTHER): Payer: Medicare HMO | Admitting: Family Medicine

## 2020-06-17 ENCOUNTER — Encounter: Payer: Self-pay | Admitting: Family Medicine

## 2020-06-17 ENCOUNTER — Other Ambulatory Visit: Payer: Self-pay

## 2020-06-17 VITALS — BP 126/60 | HR 80 | Temp 98.4°F | Ht 64.0 in | Wt 125.0 lb

## 2020-06-17 DIAGNOSIS — R197 Diarrhea, unspecified: Secondary | ICD-10-CM | POA: Diagnosis not present

## 2020-06-17 DIAGNOSIS — D72829 Elevated white blood cell count, unspecified: Secondary | ICD-10-CM | POA: Diagnosis not present

## 2020-06-17 DIAGNOSIS — E876 Hypokalemia: Secondary | ICD-10-CM

## 2020-06-17 NOTE — Progress Notes (Signed)
Established Patient Office Visit  Subjective:  Patient ID: Tonya Hamilton, female    DOB: [birth year]  Age: 74 y.o. MRN: [id number]  CC:  Chief Complaint  Patient presents with  . Hospitalization Follow-up    HPI AARIAH GODETTE presents for follow-up from recent ER visit and for prolonged diarrhea, nausea and vomiting.  Refer to recent notes for details.  She relates onset of vomiting and diarrhea around August 1.  She was seen here on the fifth and diagnosed with presumed diverticulitis.  She was started on Cipro and Flagyl and Zofran but symptoms persisted.  She went to the ER on the ninth.  Her potassium came back 2.9.  White blood count elevated at 29,000.  Urine dipstick revealed blood but no leukocytes.  Patient was given IV fluids and felt better afterwards.  Her diarrhea gradually resolved.  She had no further diarrhea until this past Saturday when she had 1 day of diarrhea but none since then.  She has not had any bloody stools.  Her Cipro and Flagyl were discontinued in the ER.  She had not had any preceding antibiotics prior to that.  No recent travels.  She is at 8 pound weight loss from a month ago.  Her appetite is still poor but she is keeping down fluids and bland foods.  No abdominal pain.  She had Cologuard 2 years ago which was negative  Past Medical History:  Diagnosis Date  . Arthritis   . Colon polyps   . Diverticulitis   . Hyperlipidemia   . Hypertension   . Hypothyroidism   . PONV (postoperative nausea and vomiting)   . Thyroid disease   . UTI (urinary tract infection)     Past Surgical History:  Procedure Laterality Date  . ABDOMINAL HYSTERECTOMY  1993   TAH, endometriosis  . CHOLECYSTECTOMY  2002  . TOTAL HIP ARTHROPLASTY Right 05/31/2013   Procedure: RIGHT TOTAL HIP ARTHROPLASTY ANTERIOR APPROACH;  Surgeon: Gearlean Alf, MD;  Location: [facility];  Service: Orthopedics;  Laterality: Right;    Family History  Problem Relation Age of Onset  .  Arthritis Mother   . Hypertension Mother   . Heart disease Mother   . Hypertension Father   . Stroke Father   . Heart disease Father   . Heart Problems Maternal Grandfather     Social History   Socioeconomic History  . Marital status: Married    Spouse name: Juanda Crumble   . Number of children: 2  . Years of education: 9  . Highest education level: Not on file  Occupational History  . Not on file  Tobacco Use  . Smoking status: Never Smoker  . Smokeless tobacco: Never Used  Vaping Use  . Vaping Use: Never used  Substance and Sexual Activity  . Alcohol use: No  . Drug use: No  . Sexual activity: Not Currently  Other Topics Concern  . Not on file  Social History Narrative   2 cups coffee per day   Soda daily (1/day)   Lives with husband   Right handed   Social Determinants of Health   Financial Resource Strain:   . Difficulty of Paying Living Expenses:   Food Insecurity:   . Worried About Charity fundraiser in the Last Year:   . Arboriculturist in the Last Year:   Transportation Needs:   . Film/video editor (Medical):   Marland Kitchen Lack of Transportation (Non-Medical):   Physical Activity:   .  Days of Exercise per Week:   . Minutes of Exercise per Session:   Stress:   . Feeling of Stress :   Social Connections:   . Frequency of Communication with Friends and Family:   . Frequency of Social Gatherings with Friends and Family:   . Attends Religious Services:   . Active Member of Clubs or Organizations:   . Attends Archivist Meetings:   Marland Kitchen Marital Status:   Intimate Partner Violence:   . Fear of Current or Ex-Partner:   . Emotionally Abused:   Marland Kitchen Physically Abused:   . Sexually Abused:     Outpatient Medications Prior to Visit  Medication Sig Dispense Refill  . amLODipine (NORVASC) 5 MG tablet Take 1 tablet (5 mg total) by mouth daily. 90 tablet 3  . aspirin-acetaminophen-caffeine (EXCEDRIN MIGRAINE) 250-250-65 MG tablet Take 2 tablets by mouth every 6  (six) hours as needed for headache.    . Bacillus Coagulans-Inulin (PROBIOTIC) 1-250 BILLION-MG CAPS Take 1 capsule by mouth daily. 30 capsule 0  . bismuth subsalicylate (PEPTO BISMOL) 262 MG/15ML suspension Take 30 mLs by mouth every 6 (six) hours as needed for indigestion or diarrhea or loose stools.    . ciclopirox (LOPROX) 0.77 % cream Apply topically 2 (two) times daily. 15 g 1  . diphenhydramine-acetaminophen (TYLENOL PM) 25-500 MG TABS tablet Take 2 tablets by mouth at bedtime as needed (sleep/pain).    . fluticasone (FLONASE) 50 MCG/ACT nasal spray Place 1-2 sprays into both nostrils daily as needed for allergies or rhinitis.    Marland Kitchen ibuprofen (ADVIL) 200 MG tablet Take 400 mg by mouth every 6 (six) hours as needed for headache or mild pain.    Marland Kitchen imipramine (TOFRANIL) 10 MG tablet TAKE 1-2 TABLETS BY MOUTH AT BEDTIME 180 tablet 2  . levothyroxine (SYNTHROID) 25 MCG tablet Take 1 tablet (25 mcg total) by mouth daily. 90 tablet 3  . meloxicam (MOBIC) 7.5 MG tablet Take 1 tablet (7.5 mg total) by mouth daily. 30 tablet 1  . ondansetron (ZOFRAN ODT) 4 MG disintegrating tablet Take 1 tablet (4 mg total) by mouth every 8 (eight) hours as needed for nausea or vomiting. 10 tablet 0  . ondansetron (ZOFRAN) 4 MG tablet Take 1 tablet (4 mg total) by mouth every 8 (eight) hours as needed for nausea or vomiting. 20 tablet 0  . trimethoprim (TRIMPEX) 100 MG tablet Take 1 tablet (100 mg total) by mouth daily. 90 tablet 3   No facility-administered medications prior to visit.    Allergies  Allergen Reactions  . Botox [Onabotulinumtoxina]     Was given too much botox and at night, felt like her throat was closing up.   . Diclofenac   . Lisinopril Swelling  . Simvastatin   . Statins     Body aches all over  . Sulfa Antibiotics Itching    ROS Review of Systems  Constitutional: Positive for appetite change. Negative for chills and fever.  Respiratory: Negative for cough and shortness of breath.     Cardiovascular: Negative for chest pain.  Gastrointestinal: Negative for abdominal pain, blood in stool, nausea and vomiting.  Genitourinary: Negative for dysuria and flank pain.  Neurological: Negative for dizziness.      Objective:    Physical Exam Vitals reviewed.  Constitutional:      Appearance: Normal appearance.  Cardiovascular:     Rate and Rhythm: Normal rate and regular rhythm.  Pulmonary:     Effort: Pulmonary effort is normal.  Breath sounds: Normal breath sounds.  Abdominal:     General: Bowel sounds are normal. There is no distension.     Palpations: Abdomen is soft.     Tenderness: There is no abdominal tenderness. There is no guarding or rebound.  Musculoskeletal:     Right lower leg: No edema.     Left lower leg: No edema.  Neurological:     Mental Status: She is alert.     BP 126/60   Pulse 80   Temp 98.4 F (36.9 C) (Oral)   Ht 5\' 4"  (1.626 m)   Wt 125 lb (56.7 kg)   SpO2 99%   BMI 21.46 kg/m  Wt Readings from Last 3 Encounters:  06/17/20 125 lb (56.7 kg)  06/10/20 130 lb (59 kg)  06/06/20 131 lb (59.4 kg)     Health Maintenance Due  Topic Date Due  . COVID-19 Vaccine (1) Never done  . TETANUS/TDAP  11/15/2018  . MAMMOGRAM  02/10/2020  . INFLUENZA VACCINE  06/02/2020    There are no preventive care reminders to display for this patient.  Lab Results  Component Value Date   TSH 2.12 10/13/2019   Lab Results  Component Value Date   WBC 29.5 (H) 06/10/2020   HGB 14.4 06/10/2020   HCT 43.6 06/10/2020   MCV 91.0 06/10/2020   PLT 462 (H) 06/10/2020   Lab Results  Component Value Date   NA 141 06/10/2020   K 2.9 (L) 06/10/2020   CO2 24 06/10/2020   GLUCOSE 140 (H) 06/10/2020   BUN 8 06/10/2020   CREATININE 0.78 06/10/2020   BILITOT 0.5 06/10/2020   ALKPHOS 62 06/10/2020   AST 25 06/10/2020   ALT 25 06/10/2020   PROT 6.8 06/10/2020   ALBUMIN 4.1 06/10/2020   CALCIUM 8.4 (L) 06/10/2020   ANIONGAP 12 06/10/2020   GFR  77.19 10/28/2017   Lab Results  Component Value Date   CHOL 190 10/28/2017   Lab Results  Component Value Date   HDL 39.00 (L) 10/28/2017   Lab Results  Component Value Date   LDLCALC 131 (H) 10/28/2017   Lab Results  Component Value Date   TRIG 101.0 10/28/2017   Lab Results  Component Value Date   CHOLHDL 5 10/28/2017   No results found for: HGBA1C    Assessment & Plan:   Problem List Items Addressed This Visit    None    Visit Diagnoses    Hypokalemia    -  Primary   Relevant Orders   Basic metabolic panel   Leukocytosis, unspecified type       Relevant Orders   CBC with Differential/Platelet   Diarrhea, unspecified type        Patient presents for follow-up regarding recent prolonged nausea, vomiting, and diarrhea.  Presumed gastroenteritis.  Recent ER notes and labs reviewed.  She had substantial leukocytosis as well as hypokalemia.  Her symptoms are improved at this time though she has had 8 pound weight loss still has poor appetite  -Continue bland diet and plenty of fluids and electrolyte replacement. -Recheck basic metabolic panel and CBC with differential -Touch base for any recurrent diarrhea, vomiting, or other concerns -Consider stool pathogen panel and possible GI referral for any recurrent diarrhea  No orders of the defined types were placed in this encounter.   Follow-up: No follow-ups on file.    Carolann Littler, MD

## 2020-06-17 NOTE — Patient Instructions (Signed)
Diarrhea, Adult °Diarrhea is frequent loose and watery bowel movements. Diarrhea can make you feel weak and cause you to become dehydrated. Dehydration can make you tired and thirsty, cause you to have a dry mouth, and decrease how often you urinate. °Diarrhea typically lasts 2-3 days. However, it can last longer if it is a sign of something more serious. It is important to treat your diarrhea as told by your health care provider. °Follow these instructions at home: °Eating and drinking ° °  ° °Follow these recommendations as told by your health care provider: °· Take an oral rehydration solution (ORS). This is an over-the-counter medicine that helps return your body to its normal balance of nutrients and water. It is found at pharmacies and retail stores. °· Drink plenty of fluids, such as water, ice chips, diluted fruit juice, and low-calorie sports drinks. You can drink milk also, if desired. °· Avoid drinking fluids that contain a lot of sugar or caffeine, such as energy drinks, sports drinks, and soda. °· Eat bland, easy-to-digest foods in small amounts as you are able. These foods include bananas, applesauce, rice, lean meats, toast, and crackers. °· Avoid alcohol. °· Avoid spicy or fatty foods. ° °Medicines °· Take over-the-counter and prescription medicines only as told by your health care provider. °· If you were prescribed an antibiotic medicine, take it as told by your health care provider. Do not stop using the antibiotic even if you start to feel better. °General instructions ° °· Wash your hands often using soap and water. If soap and water are not available, use a hand sanitizer. Others in the household should wash their hands as well. Hands should be washed: °? After using the toilet or changing a diaper. °? Before preparing, cooking, or serving food. °? While caring for a sick person or while visiting someone in a hospital. °· Drink enough fluid to keep your urine pale yellow. °· Rest at home while  you recover. °· Watch your condition for any changes. °· Take a warm bath to relieve any burning or pain from frequent diarrhea episodes. °· Keep all follow-up visits as told by your health care provider. This is important. °Contact a health care provider if: °· You have a fever. °· Your diarrhea gets worse. °· You have new symptoms. °· You cannot keep fluids down. °· You feel light-headed or dizzy. °· You have a headache. °· You have muscle cramps. °Get help right away if: °· You have chest pain. °· You feel extremely weak or you faint. °· You have bloody or black stools or stools that look like tar. °· You have severe pain, cramping, or bloating in your abdomen. °· You have trouble breathing or you are breathing very quickly. °· Your heart is beating very quickly. °· Your skin feels cold and clammy. °· You feel confused. °· You have signs of dehydration, such as: °? Dark urine, very little urine, or no urine. °? Cracked lips. °? Dry mouth. °? Sunken eyes. °? Sleepiness. °? Weakness. °Summary °· Diarrhea is frequent loose and watery bowel movements. Diarrhea can make you feel weak and cause you to become dehydrated. °· Drink enough fluids to keep your urine pale yellow. °· Make sure that you wash your hands after using the toilet. If soap and water are not available, use hand sanitizer. °· Contact a health care provider if your diarrhea gets worse or you have new symptoms. °· Get help right away if you have signs of dehydration. °This   information is not intended to replace advice given to you by your health care provider. Make sure you discuss any questions you have with your health care provider. °Document Revised: 03/07/2019 Document Reviewed: 03/25/2018 °Elsevier Patient Education © 2020 [employer] ° °

## 2020-06-18 LAB — CBC WITH DIFFERENTIAL/PLATELET
Absolute Monocytes: 886 cells/uL (ref 200–950)
Basophils Absolute: 52 cells/uL (ref 0–200)
Basophils Relative: 0.5 %
Eosinophils Absolute: 82 cells/uL (ref 15–500)
Eosinophils Relative: 0.8 %
HCT: 39.7 % (ref 35.0–45.0)
Hemoglobin: 13.3 g/dL (ref 11.7–15.5)
Lymphs Abs: 1658 cells/uL (ref 850–3900)
MCH: 29.7 pg (ref 27.0–33.0)
MCHC: 33.5 g/dL (ref 32.0–36.0)
MCV: 88.6 fL (ref 80.0–100.0)
MPV: 11 fL (ref 7.5–12.5)
Monocytes Relative: 8.6 %
Neutro Abs: 7622 cells/uL (ref 1500–7800)
Neutrophils Relative %: 74 %
Platelets: 502 10*3/uL — ABNORMAL HIGH (ref 140–400)
RBC: 4.48 10*6/uL (ref 3.80–5.10)
RDW: 13.5 % (ref 11.0–15.0)
Total Lymphocyte: 16.1 %
WBC: 10.3 10*3/uL (ref 3.8–10.8)

## 2020-06-18 LAB — BASIC METABOLIC PANEL
BUN: 12 mg/dL (ref 7–25)
CO2: 30 mmol/L (ref 20–32)
Calcium: 9.5 mg/dL (ref 8.6–10.4)
Chloride: 99 mmol/L (ref 98–110)
Creat: 0.69 mg/dL (ref 0.60–0.93)
Glucose, Bld: 108 mg/dL — ABNORMAL HIGH (ref 65–99)
Potassium: 4.1 mmol/L (ref 3.5–5.3)
Sodium: 140 mmol/L (ref 135–146)

## 2020-07-05 ENCOUNTER — Telehealth: Payer: Self-pay | Admitting: Family Medicine

## 2020-07-05 DIAGNOSIS — R112 Nausea with vomiting, unspecified: Secondary | ICD-10-CM

## 2020-07-05 NOTE — Telephone Encounter (Signed)
Pt is calling in stating that she would like to have the referral to have the endoscopy done due to her doing a little throwing up last week for about a hour.

## 2020-07-10 NOTE — Telephone Encounter (Signed)
Please advise if you spoke to pt about this

## 2020-07-11 NOTE — Telephone Encounter (Signed)
Patient notified of update  and verbalized understanding. 

## 2020-07-11 NOTE — Telephone Encounter (Signed)
If having recurrent vomiting (or diarrhea) suggest go ahead with GI referral.

## 2020-07-11 NOTE — Telephone Encounter (Signed)
GI referral placed

## 2020-07-12 ENCOUNTER — Encounter: Payer: Self-pay | Admitting: Internal Medicine

## 2020-07-16 ENCOUNTER — Other Ambulatory Visit: Payer: Self-pay

## 2020-07-16 ENCOUNTER — Telehealth: Payer: Self-pay | Admitting: Family Medicine

## 2020-07-16 ENCOUNTER — Ambulatory Visit (INDEPENDENT_AMBULATORY_CARE_PROVIDER_SITE_OTHER): Payer: Medicare HMO | Admitting: Family Medicine

## 2020-07-16 ENCOUNTER — Encounter: Payer: Self-pay | Admitting: Family Medicine

## 2020-07-16 VITALS — BP 130/68 | HR 89 | Temp 98.1°F | Ht 64.0 in | Wt 125.5 lb

## 2020-07-16 DIAGNOSIS — R1010 Upper abdominal pain, unspecified: Secondary | ICD-10-CM

## 2020-07-16 DIAGNOSIS — R197 Diarrhea, unspecified: Secondary | ICD-10-CM | POA: Diagnosis not present

## 2020-07-16 DIAGNOSIS — R634 Abnormal weight loss: Secondary | ICD-10-CM | POA: Diagnosis not present

## 2020-07-16 NOTE — Telephone Encounter (Signed)
BB-Turgor test confirms dehydration after trying twice to obtain specimen/pt will come back after hydrates and has completed stool collection and we will redraw blood/thx dmf

## 2020-07-16 NOTE — Telephone Encounter (Signed)
Thanks

## 2020-07-16 NOTE — Progress Notes (Signed)
Established Patient Office Visit  Subjective:  Patient ID: Tonya Hamilton, female    DOB: 1945/11/17  Age: 74 y.o. MRN: [id number]  CC:  Chief Complaint  Patient presents with  . Weight Loss    patient is concerned with episodes of feeling sick, diarrhea, vomiting intermittently xAugust 1st, still complains of lack of appetite, states she has an appt with GI on November 3rd    HPI Tonya Hamilton presents for evaluation of ongoing intermittent episodes of vomiting, diarrhea, loss of appetite, and weight loss.  Refer to previous notes for details.  She developed initial symptoms on August 1.  She recalls eating lunch of a chicken sandwich on 31 July and she felt like the chicken "did not taste right".  She did not have any suspicion that this was undercooked.  By the next day she had vomiting and diarrhea.  She was seen here on the 5th by Dr. Volanda Napoleon and diagnosed with possible acute diverticulitis and placed on Cipro and Flagyl.  Her symptoms worsened and she went to the ER on the 9th.  CT scan was unremarkable.  She had low potassium.  White blood count of 29,000 with normal hemoglobin.  Patient was then seen back here on the 16th and repeat white count was 10.3 thousand and potassium was normal.  At that point her symptoms had improved some.  This past Friday night she again had episode of vomiting last about 2 hours with some diarrhea which lasted a couple days.  Her symptoms been very intermittent.  She has had previous cholecystectomy.  She had Cologuard 2 years ago negative.  Denies any recent melena.  She does have some epigastric pain intermittently.  Recent lipase was unremarkable. Her vomiting Friday night occurred about 2 hours after eating.  She has not seen a consistent pattern of postprandial vomiting and has really scaled back her intake and is eating very bland foods past few weeks.  Her weight is 125 pounds today which is unchanged from last visit few weeks ago but down about 8  pounds overall Denies any dysphagia.  Past Medical History:  Diagnosis Date  . Arthritis   . Colon polyps   . Diverticulitis   . Hyperlipidemia   . Hypertension   . Hypothyroidism   . PONV (postoperative nausea and vomiting)   . Thyroid disease   . UTI (urinary tract infection)     Past Surgical History:  Procedure Laterality Date  . ABDOMINAL HYSTERECTOMY  1993   TAH, endometriosis  . CHOLECYSTECTOMY  2002  . TOTAL HIP ARTHROPLASTY Right 05/31/2013   Procedure: RIGHT TOTAL HIP ARTHROPLASTY ANTERIOR APPROACH;  Surgeon: Gearlean Alf, MD;  Location: [facility];  Service: Orthopedics;  Laterality: Right;    Family History  Problem Relation Age of Onset  . Arthritis Mother   . Hypertension Mother   . Heart disease Mother   . Hypertension Father   . Stroke Father   . Heart disease Father   . Heart Problems Maternal Grandfather     Social History   Socioeconomic History  . Marital status: Married    Spouse name: Juanda Crumble   . Number of children: 2  . Years of education: 20  . Highest education level: Not on file  Occupational History  . Not on file  Tobacco Use  . Smoking status: Never Smoker  . Smokeless tobacco: Never Used  Vaping Use  . Vaping Use: Never used  Substance and Sexual Activity  . Alcohol  use: No  . Drug use: No  . Sexual activity: Not Currently  Other Topics Concern  . Not on file  Social History Narrative   2 cups coffee per day   Soda daily (1/day)   Lives with husband   Right handed   Social Determinants of Health   Financial Resource Strain:   . Difficulty of Paying Living Expenses: Not on file  Food Insecurity:   . Worried About Charity fundraiser in the Last Year: Not on file  . Ran Out of Food in the Last Year: Not on file  Transportation Needs:   . Lack of Transportation (Medical): Not on file  . Lack of Transportation (Non-Medical): Not on file  Physical Activity:   . Days of Exercise per Week: Not on file  . Minutes of  Exercise per Session: Not on file  Stress:   . Feeling of Stress : Not on file  Social Connections:   . Frequency of Communication with Friends and Family: Not on file  . Frequency of Social Gatherings with Friends and Family: Not on file  . Attends Religious Services: Not on file  . Active Member of Clubs or Organizations: Not on file  . Attends Archivist Meetings: Not on file  . Marital Status: Not on file  Intimate Partner Violence:   . Fear of Current or Ex-Partner: Not on file  . Emotionally Abused: Not on file  . Physically Abused: Not on file  . Sexually Abused: Not on file    Outpatient Medications Prior to Visit  Medication Sig Dispense Refill  . amLODipine (NORVASC) 5 MG tablet Take 1 tablet (5 mg total) by mouth daily. 90 tablet 3  . aspirin-acetaminophen-caffeine (EXCEDRIN MIGRAINE) 250-250-65 MG tablet Take 2 tablets by mouth every 6 (six) hours as needed for headache.    . Bacillus Coagulans-Inulin (PROBIOTIC) 1-250 BILLION-MG CAPS Take 1 capsule by mouth daily. 30 capsule 0  . bismuth subsalicylate (PEPTO BISMOL) 262 MG/15ML suspension Take 30 mLs by mouth every 6 (six) hours as needed for indigestion or diarrhea or loose stools.    . ciclopirox (LOPROX) 0.77 % cream Apply topically 2 (two) times daily. 15 g 1  . diphenhydramine-acetaminophen (TYLENOL PM) 25-500 MG TABS tablet Take 2 tablets by mouth at bedtime as needed (sleep/pain).    . fluticasone (FLONASE) 50 MCG/ACT nasal spray Place 1-2 sprays into both nostrils daily as needed for allergies or rhinitis.    Marland Kitchen ibuprofen (ADVIL) 200 MG tablet Take 400 mg by mouth every 6 (six) hours as needed for headache or mild pain.    Marland Kitchen imipramine (TOFRANIL) 10 MG tablet TAKE 1-2 TABLETS BY MOUTH AT BEDTIME 180 tablet 2  . levothyroxine (SYNTHROID) 25 MCG tablet Take 1 tablet (25 mcg total) by mouth daily. 90 tablet 3  . meloxicam (MOBIC) 7.5 MG tablet Take 1 tablet (7.5 mg total) by mouth daily. 30 tablet 1  .  ondansetron (ZOFRAN ODT) 4 MG disintegrating tablet Take 1 tablet (4 mg total) by mouth every 8 (eight) hours as needed for nausea or vomiting. 10 tablet 0  . ondansetron (ZOFRAN) 4 MG tablet Take 1 tablet (4 mg total) by mouth every 8 (eight) hours as needed for nausea or vomiting. 20 tablet 0  . trimethoprim (TRIMPEX) 100 MG tablet Take 1 tablet (100 mg total) by mouth daily. 90 tablet 3   No facility-administered medications prior to visit.    Allergies  Allergen Reactions  . Botox [Onabotulinumtoxina]  Was given too much botox and at night, felt like her throat was closing up.   . Diclofenac   . Lisinopril Swelling  . Simvastatin   . Statins     Body aches all over  . Sulfa Antibiotics Itching    ROS Review of Systems  Constitutional: Positive for appetite change and unexpected weight change. Negative for chills and fever.  Respiratory: Negative for shortness of breath.   Cardiovascular: Negative for chest pain.  Gastrointestinal: Positive for abdominal pain, nausea and vomiting. Negative for blood in stool.  Genitourinary: Negative for dysuria and flank pain.  Neurological: Negative for headaches.  Hematological: Negative for adenopathy.      Objective:    Physical Exam Vitals reviewed.  Constitutional:      Appearance: Normal appearance.  Cardiovascular:     Rate and Rhythm: Normal rate and regular rhythm.  Pulmonary:     Effort: Pulmonary effort is normal.     Breath sounds: Normal breath sounds.  Abdominal:     General: Bowel sounds are normal. There is no distension.     Palpations: Abdomen is soft.     Comments: She has some mild tenderness epigastric region.  No masses.  No guarding or rebound.  Neurological:     Mental Status: She is alert.     BP 130/68 (BP Location: Left Arm, Patient Position: Sitting, Cuff Size: Normal)   Pulse 89   Temp 98.1 F (36.7 C) (Oral)   Ht 5\' 4"  (1.626 m)   Wt 125 lb 8 oz (56.9 kg)   BMI 21.54 kg/m  Wt Readings  from Last 3 Encounters:  07/16/20 125 lb 8 oz (56.9 kg)  06/17/20 125 lb (56.7 kg)  06/10/20 130 lb (59 kg)     There are no preventive care reminders to display for this patient.  There are no preventive care reminders to display for this patient.  Lab Results  Component Value Date   TSH 2.12 10/13/2019   Lab Results  Component Value Date   WBC 10.3 06/17/2020   HGB 13.3 06/17/2020   HCT 39.7 06/17/2020   MCV 88.6 06/17/2020   PLT 502 (H) 06/17/2020   Lab Results  Component Value Date   NA 140 06/17/2020   K 4.1 06/17/2020   CO2 30 06/17/2020   GLUCOSE 108 (H) 06/17/2020   BUN 12 06/17/2020   CREATININE 0.69 06/17/2020   BILITOT 0.5 06/10/2020   ALKPHOS 62 06/10/2020   AST 25 06/10/2020   ALT 25 06/10/2020   PROT 6.8 06/10/2020   ALBUMIN 4.1 06/10/2020   CALCIUM 9.5 06/17/2020   ANIONGAP 12 06/10/2020   GFR 77.19 10/28/2017   Lab Results  Component Value Date   CHOL 190 10/28/2017   Lab Results  Component Value Date   HDL 39.00 (L) 10/28/2017   Lab Results  Component Value Date   LDLCALC 131 (H) 10/28/2017   Lab Results  Component Value Date   TRIG 101.0 10/28/2017   Lab Results  Component Value Date   CHOLHDL 5 10/28/2017   No results found for: HGBA1C    Assessment & Plan:   Patient presents with over 1 month history of intermittent vomiting, nausea, diarrhea, and weight loss.  Her weight is actually stable over the past few weeks but down about 8 pounds she states from her baseline.  Recent CT abdomen/ pelvis unremarkable for acute findings.  She has had previous cholecystectomy.  -Check further labs today with CBC, CMP, repeat lipase,  fecal elastase, Helicobacter stool antigen. -Patient has pending follow-up with GI on November 3 and will see about getting this moved up if possible. -If we cannot get her in sooner consider upper GI  No orders of the defined types were placed in this encounter.   Follow-up: No follow-ups on file.     Carolann Littler, MD

## 2020-07-16 NOTE — Patient Instructions (Signed)
We will call with lab results and be setting up UGI to further assess.

## 2020-07-18 ENCOUNTER — Other Ambulatory Visit: Payer: Self-pay

## 2020-07-18 DIAGNOSIS — R197 Diarrhea, unspecified: Secondary | ICD-10-CM | POA: Diagnosis not present

## 2020-07-18 DIAGNOSIS — R634 Abnormal weight loss: Secondary | ICD-10-CM | POA: Diagnosis not present

## 2020-07-20 ENCOUNTER — Other Ambulatory Visit: Payer: Self-pay | Admitting: Family Medicine

## 2020-07-20 DIAGNOSIS — K529 Noninfective gastroenteritis and colitis, unspecified: Secondary | ICD-10-CM

## 2020-07-22 DIAGNOSIS — Z87898 Personal history of other specified conditions: Secondary | ICD-10-CM | POA: Diagnosis not present

## 2020-07-22 DIAGNOSIS — R1110 Vomiting, unspecified: Secondary | ICD-10-CM | POA: Diagnosis not present

## 2020-07-22 NOTE — Telephone Encounter (Signed)
Please advise 

## 2020-07-22 NOTE — Telephone Encounter (Signed)
Refill once OK. 

## 2020-07-23 ENCOUNTER — Other Ambulatory Visit: Payer: Self-pay

## 2020-07-23 DIAGNOSIS — R1110 Vomiting, unspecified: Secondary | ICD-10-CM | POA: Diagnosis not present

## 2020-07-23 MED ORDER — ONDANSETRON 4 MG PO TBDP: 4.0000 mg | ORAL_TABLET | Freq: Three times a day (TID) | ORAL | 0 refills | Status: DC | PRN

## 2020-07-25 LAB — CBC WITH DIFFERENTIAL/PLATELET
Absolute Monocytes: 782 cells/uL (ref 200–950)
Basophils Absolute: 43 cells/uL (ref 0–200)
Basophils Relative: 0.5 %
Eosinophils Absolute: 111 cells/uL (ref 15–500)
Eosinophils Relative: 1.3 %
HCT: 38.6 % (ref 35.0–45.0)
Hemoglobin: 12.8 g/dL (ref 11.7–15.5)
Lymphs Abs: 1726 cells/uL (ref 850–3900)
MCH: 29.5 pg (ref 27.0–33.0)
MCHC: 33.2 g/dL (ref 32.0–36.0)
MCV: 88.9 fL (ref 80.0–100.0)
MPV: 12.2 fL (ref 7.5–12.5)
Monocytes Relative: 9.2 %
Neutro Abs: 5840 cells/uL (ref 1500–7800)
Neutrophils Relative %: 68.7 %
Platelets: 384 10*3/uL (ref 140–400)
RBC: 4.34 10*6/uL (ref 3.80–5.10)
RDW: 13.1 % (ref 11.0–15.0)
Total Lymphocyte: 20.3 %
WBC: 8.5 10*3/uL (ref 3.8–10.8)

## 2020-07-25 LAB — COMPREHENSIVE METABOLIC PANEL
AG Ratio: 1.8 (calc) (ref 1.0–2.5)
ALT: 18 U/L (ref 6–29)
AST: 23 U/L (ref 10–35)
Albumin: 4.3 g/dL (ref 3.6–5.1)
Alkaline phosphatase (APISO): 74 U/L (ref 37–153)
BUN: 7 mg/dL (ref 7–25)
CO2: 23 mmol/L (ref 20–32)
Calcium: 9.1 mg/dL (ref 8.6–10.4)
Chloride: 103 mmol/L (ref 98–110)
Creat: 0.65 mg/dL (ref 0.60–0.93)
Globulin: 2.4 g/dL (calc) (ref 1.9–3.7)
Glucose, Bld: 112 mg/dL — ABNORMAL HIGH (ref 65–99)
Potassium: 4.1 mmol/L (ref 3.5–5.3)
Sodium: 139 mmol/L (ref 135–146)
Total Bilirubin: 0.4 mg/dL (ref 0.2–1.2)
Total Protein: 6.7 g/dL (ref 6.1–8.1)

## 2020-07-25 LAB — HELICOBACTER PYLORI  SPECIAL ANTIGEN
MICRO NUMBER:: 10964389
SPECIMEN QUALITY: ADEQUATE

## 2020-07-25 LAB — LIPASE: Lipase: 14 U/L (ref 7–60)

## 2020-07-25 LAB — PANCREATIC ELASTASE, FECAL: Pancreatic Elastase-1, Stool: >500 mcg/g

## 2020-07-26 DIAGNOSIS — R1110 Vomiting, unspecified: Secondary | ICD-10-CM | POA: Diagnosis not present

## 2020-07-30 DIAGNOSIS — Z9229 Personal history of other drug therapy: Secondary | ICD-10-CM | POA: Diagnosis not present

## 2020-07-30 DIAGNOSIS — Z01818 Encounter for other preprocedural examination: Secondary | ICD-10-CM | POA: Diagnosis not present

## 2020-07-30 DIAGNOSIS — R112 Nausea with vomiting, unspecified: Secondary | ICD-10-CM | POA: Diagnosis not present

## 2020-08-06 DIAGNOSIS — Z01818 Encounter for other preprocedural examination: Secondary | ICD-10-CM | POA: Diagnosis not present

## 2020-08-06 DIAGNOSIS — R112 Nausea with vomiting, unspecified: Secondary | ICD-10-CM | POA: Diagnosis not present

## 2020-08-06 DIAGNOSIS — Z1211 Encounter for screening for malignant neoplasm of colon: Secondary | ICD-10-CM | POA: Diagnosis not present

## 2020-08-09 DIAGNOSIS — K2970 Gastritis, unspecified, without bleeding: Secondary | ICD-10-CM | POA: Diagnosis not present

## 2020-08-09 DIAGNOSIS — K900 Celiac disease: Secondary | ICD-10-CM | POA: Diagnosis not present

## 2020-08-09 DIAGNOSIS — K2270 Barrett's esophagus without dysplasia: Secondary | ICD-10-CM | POA: Diagnosis not present

## 2020-08-09 DIAGNOSIS — A048 Other specified bacterial intestinal infections: Secondary | ICD-10-CM | POA: Diagnosis not present

## 2020-08-27 DIAGNOSIS — K900 Celiac disease: Secondary | ICD-10-CM | POA: Diagnosis not present

## 2020-08-31 ENCOUNTER — Ambulatory Visit: Payer: Medicare HMO | Attending: Internal Medicine

## 2020-08-31 DIAGNOSIS — Z23 Encounter for immunization: Secondary | ICD-10-CM

## 2020-08-31 NOTE — Progress Notes (Signed)
   Covid-19 Vaccination [facility]  Name:  Tonya Hamilton    MRN: [id number] DOB: [birth year]  08/31/2020  Tonya Hamilton was observed post Covid-19 immunization for 15 minutes without incident. She was provided with Vaccine Information Sheet and instruction to access the V-Safe system.   Tonya Hamilton was instructed to call 911 with any severe reactions post vaccine: Marland Kitchen Difficulty breathing  . Swelling of face and throat  . A fast heartbeat  . A bad rash all over body  . Dizziness and weakness

## 2020-09-03 ENCOUNTER — Ambulatory Visit (INDEPENDENT_AMBULATORY_CARE_PROVIDER_SITE_OTHER): Payer: Medicare HMO

## 2020-09-03 VITALS — Wt 120.0 lb

## 2020-09-03 DIAGNOSIS — Z0000 Encounter for general adult medical examination without abnormal findings: Secondary | ICD-10-CM

## 2020-09-03 NOTE — Patient Instructions (Signed)
[name] , Thank you for taking time to come for your Medicare Wellness Visit. I appreciate your ongoing commitment to your health goals. Please review the following plan we discussed and let me know if I can assist you in the future.   Screening recommendations/referrals: Colonoscopy: Up to date, next cologuard due 01/17/2021 Mammogram: Currently due, orders placed this visit Bone Density: Up to date, next due 12/22/2022 Recommended yearly ophthalmology/optometry visit for glaucoma screening and checkup Recommended yearly dental visit for hygiene and checkup  Vaccinations: Influenza vaccine: Currently due, you may receive this at your local pharmacy or at our office.  Pneumococcal vaccine: Completed series Tdap vaccine: Currently due, you may contact your insurance to discuss cost of vaccine or you may await and injury to receive as it will be covered at that time. Shingles vaccine: Patient declined shingrix.    Advanced directives: Please bring in copies of your advanced medical directives into our office so that we may scan them into your chart.  Conditions/risks identified: None   Next appointment: None    Preventive Care 65 Years and Older, Female Preventive care refers to lifestyle choices and visits with your health care provider that can promote health and wellness. What does preventive care include?  A yearly physical exam. This is also called an annual well check.  Dental exams once or twice a year.  Routine eye exams. Ask your health care provider how often you should have your eyes checked.  Personal lifestyle choices, including:  Daily care of your teeth and gums.  Regular physical activity.  Eating a healthy diet.  Avoiding tobacco and drug use.  Limiting alcohol use.  Practicing safe sex.  Taking low-dose aspirin every day.  Taking vitamin and mineral supplements as recommended by your health care provider. What happens during an annual well  check? The services and screenings done by your health care provider during your annual well check will depend on your age, overall health, lifestyle risk factors, and family history of disease. Counseling  Your health care provider may ask you questions about your:  Alcohol use.  Tobacco use.  Drug use.  Emotional well-being.  Home and relationship well-being.  Sexual activity.  Eating habits.  History of falls.  Memory and ability to understand (cognition).  Work and work Statistician.  Reproductive health. Screening  You may have the following tests or measurements:  Height, weight, and BMI.  Blood pressure.  Lipid and cholesterol levels. These may be checked every 5 years, or more frequently if you are over 38 years old.  Skin check.  Lung cancer screening. You may have this screening every year starting at age 51 if you have a 30-pack-year history of smoking and currently smoke or have quit within the past 15 years.  Fecal occult blood test (FOBT) of the stool. You may have this test every year starting at age 34.  Flexible sigmoidoscopy or colonoscopy. You may have a sigmoidoscopy every 5 years or a colonoscopy every 10 years starting at age 1.  Hepatitis C blood test.  Hepatitis B blood test.  Sexually transmitted disease (STD) testing.  Diabetes screening. This is done by checking your blood sugar (glucose) after you have not eaten for a while (fasting). You may have this done every 1-3 years.  Bone density scan. This is done to screen for osteoporosis. You may have this done starting at age 30.  Mammogram. This may be done every 1-2 years. Talk to your health care provider about  how often you should have regular mammograms. Talk with your health care provider about your test results, treatment options, and if necessary, the need for more tests. Vaccines  Your health care provider may recommend certain vaccines, such as:  Influenza vaccine. This is  recommended every year.  Tetanus, diphtheria, and acellular pertussis (Tdap, Td) vaccine. You may need a Td booster every 10 years.  Zoster vaccine. You may need this after age 107.  Pneumococcal 13-valent conjugate (PCV13) vaccine. One dose is recommended after age 60.  Pneumococcal polysaccharide (PPSV23) vaccine. One dose is recommended after age 9. Talk to your health care provider about which screenings and vaccines you need and how often you need them. This information is not intended to replace advice given to you by your health care provider. Make sure you discuss any questions you have with your health care provider. Document Released: 11/15/2015 Document Revised: 07/08/2016 Document Reviewed: 08/20/2015 Elsevier Interactive Patient Education  2017 [place] Prevention in the Home Falls can cause injuries. They can happen to people of all ages. There are many things you can do to make your home safe and to help prevent falls. What can I do on the outside of my home?  Regularly fix the edges of walkways and driveways and fix any cracks.  Remove anything that might make you trip as you walk through a door, such as a raised step or threshold.  Trim any bushes or trees on the path to your home.  Use bright outdoor lighting.  Clear any walking paths of anything that might make someone trip, such as rocks or tools.  Regularly check to see if handrails are loose or broken. Make sure that both sides of any steps have handrails.  Any raised decks and porches should have guardrails on the edges.  Have any leaves, snow, or ice cleared regularly.  Use sand or salt on walking paths during winter.  Clean up any spills in your garage right away. This includes oil or grease spills. What can I do in the bathroom?  Use night lights.  Install grab bars by the toilet and in the tub and shower. Do not use towel bars as grab bars.  Use non-skid mats or decals in the tub or  shower.  If you need to sit down in the shower, use a plastic, non-slip stool.  Keep the floor dry. Clean up any water that spills on the floor as soon as it happens.  Remove soap buildup in the tub or shower regularly.  Attach bath mats securely with double-sided non-slip rug tape.  Do not have throw rugs and other things on the floor that can make you trip. What can I do in the bedroom?  Use night lights.  Make sure that you have a light by your bed that is easy to reach.  Do not use any sheets or blankets that are too big for your bed. They should not hang down onto the floor.  Have a firm chair that has side arms. You can use this for support while you get dressed.  Do not have throw rugs and other things on the floor that can make you trip. What can I do in the kitchen?  Clean up any spills right away.  Avoid walking on wet floors.  Keep items that you use a lot in easy-to-reach places.  If you need to reach something above you, use a strong step stool that has a grab bar.  Keep  electrical cords out of the way.  Do not use floor polish or wax that makes floors slippery. If you must use wax, use non-skid floor wax.  Do not have throw rugs and other things on the floor that can make you trip. What can I do with my stairs?  Do not leave any items on the stairs.  Make sure that there are handrails on both sides of the stairs and use them. Fix handrails that are broken or loose. Make sure that handrails are as long as the stairways.  Check any carpeting to make sure that it is firmly attached to the stairs. Fix any carpet that is loose or worn.  Avoid having throw rugs at the top or bottom of the stairs. If you do have throw rugs, attach them to the floor with carpet tape.  Make sure that you have a light switch at the top of the stairs and the bottom of the stairs. If you do not have them, ask someone to add them for you. What else can I do to help prevent  falls?  Wear shoes that:  Do not have high heels.  Have rubber bottoms.  Are comfortable and fit you well.  Are closed at the toe. Do not wear sandals.  If you use a stepladder:  Make sure that it is fully opened. Do not climb a closed stepladder.  Make sure that both sides of the stepladder are locked into place.  Ask someone to hold it for you, if possible.  Clearly mark and make sure that you can see:  Any grab bars or handrails.  First and last steps.  Where the edge of each step is.  Use tools that help you move around (mobility aids) if they are needed. These include:  Canes.  Walkers.  Scooters.  Crutches.  Turn on the lights when you go into a dark area. Replace any light bulbs as soon as they burn out.  Set up your furniture so you have a clear path. Avoid moving your furniture around.  If any of your floors are uneven, fix them.  If there are any pets around you, be aware of where they are.  Review your medicines with your doctor. Some medicines can make you feel dizzy. This can increase your chance of falling. Ask your doctor what other things that you can do to help prevent falls. This information is not intended to replace advice given to you by your health care provider. Make sure you discuss any questions you have with your health care provider. Document Released: 08/15/2009 Document Revised: 03/26/2016 Document Reviewed: 11/23/2014 Elsevier Interactive Patient Education  2017 [employer].

## 2020-09-03 NOTE — Progress Notes (Signed)
Subjective:   Tonya Hamilton is a 74 y.o. female who presents for Medicare Annual (Subsequent) preventive examination.  I connected with Lorenso Courier today by telephone and verified that I am speaking with the correct person using two identifiers. Location patient: home Location provider: work Persons participating in the virtual visit: patient, provider.   I discussed the limitations, risks, security and privacy concerns of performing an evaluation and management service by telephone and the availability of in person appointments. I also discussed with the patient that there may be a patient responsible charge related to this service. The patient expressed understanding and verbally consented to this telephonic visit.    Interactive audio and video telecommunications were attempted between this provider and patient, however failed, due to patient having technical difficulties OR patient did not have access to video capability.  We continued and completed visit with audio only.     Review of Systems    N/A Cardiac Risk Factors include: advanced age (>9men, >32 women);hypertension;dyslipidemia     Objective:    Today's Vitals   09/03/20 1324  Weight: 120 lb (54.4 kg)   Body mass index is 20.6 kg/m.  Advanced Directives 09/03/2020 06/10/2020 10/05/2018 09/17/2017 05/31/2013 05/31/2013 05/25/2013  Does Patient Have a Medical Advance Directive? Yes Yes Yes Yes Patient has advance directive, copy not in chart - Patient has advance directive, copy not in chart  Type of Advance Directive [place];Living will - - - [place];Living will - [place];Living will  Does patient want to make changes to medical advance directive? No - Patient declined - - - - - -  Copy of [place] in Chart? No - copy requested - - - Copy requested from family - -  Pre-existing out of facility DNR order (yellow form or pink MOST form)  - - - - No No -    Current Medications (verified) Outpatient Encounter Medications as of 09/03/2020  Medication Sig  . amLODipine (NORVASC) 5 MG tablet Take 1 tablet (5 mg total) by mouth daily.  . ciclopirox (LOPROX) 0.77 % cream Apply topically 2 (two) times daily.  . diphenhydramine-acetaminophen (TYLENOL PM) 25-500 MG TABS tablet Take 2 tablets by mouth at bedtime as needed (sleep/pain).  . fluticasone (FLONASE) 50 MCG/ACT nasal spray Place 1-2 sprays into both nostrils daily as needed for allergies or rhinitis.  Marland Kitchen ibuprofen (ADVIL) 200 MG tablet Take 400 mg by mouth every 6 (six) hours as needed for headache or mild pain.  Marland Kitchen levothyroxine (SYNTHROID) 25 MCG tablet Take 1 tablet (25 mcg total) by mouth daily.  . ondansetron (ZOFRAN ODT) 4 MG disintegrating tablet Take 1 tablet (4 mg total) by mouth every 8 (eight) hours as needed for nausea or vomiting.  . ondansetron (ZOFRAN) 4 MG tablet TAKE 1 TABLET BY MOUTH EVERY 8 HOURS AS NEEDED FOR NAUSEA AND VOMITING  . pantoprazole (PROTONIX) 40 MG tablet Take 40 mg by mouth daily.  Marland Kitchen trimethoprim (TRIMPEX) 100 MG tablet Take 1 tablet (100 mg total) by mouth daily.  Marland Kitchen aspirin-acetaminophen-caffeine (EXCEDRIN MIGRAINE) 250-250-65 MG tablet Take 2 tablets by mouth every 6 (six) hours as needed for headache. (Patient not taking: Reported on 09/03/2020)  . Bacillus Coagulans-Inulin (PROBIOTIC) 1-250 BILLION-MG CAPS Take 1 capsule by mouth daily. (Patient not taking: Reported on 09/03/2020)  . bismuth subsalicylate (PEPTO BISMOL) 262 MG/15ML suspension Take 30 mLs by mouth every 6 (six) hours as needed for indigestion or diarrhea or  loose stools. (Patient not taking: Reported on 09/03/2020)  . imipramine (TOFRANIL) 10 MG tablet TAKE 1-2 TABLETS BY MOUTH AT BEDTIME  . meloxicam (MOBIC) 7.5 MG tablet Take 1 tablet (7.5 mg total) by mouth daily. (Patient not taking: Reported on 09/03/2020)   No facility-administered encounter medications on file as of  09/03/2020.    Allergies (verified) Botox [onabotulinumtoxina], Diclofenac, Lisinopril, Simvastatin, Statins, and Sulfa antibiotics   History: Past Medical History:  Diagnosis Date  . Arthritis   . Colon polyps   . Diverticulitis   . Hyperlipidemia   . Hypertension   . Hypothyroidism   . PONV (postoperative nausea and vomiting)   . Thyroid disease   . UTI (urinary tract infection)    Past Surgical History:  Procedure Laterality Date  . ABDOMINAL HYSTERECTOMY  1993   TAH, endometriosis  . CHOLECYSTECTOMY  2002  . TOTAL HIP ARTHROPLASTY Right 05/31/2013   Procedure: RIGHT TOTAL HIP ARTHROPLASTY ANTERIOR APPROACH;  Surgeon: Gearlean Alf, MD;  Location: [facility];  Service: Orthopedics;  Laterality: Right;   Family History  Problem Relation Age of Onset  . Arthritis Mother   . Hypertension Mother   . Heart disease Mother   . Hypertension Father   . Stroke Father   . Heart disease Father   . Heart Problems Maternal Grandfather    Social History   Socioeconomic History  . Marital status: Married    Spouse name: Juanda Crumble   . Number of children: 2  . Years of education: 38  . Highest education level: Not on file  Occupational History  . Not on file  Tobacco Use  . Smoking status: Never Smoker  . Smokeless tobacco: Never Used  Vaping Use  . Vaping Use: Never used  Substance and Sexual Activity  . Alcohol use: No  . Drug use: No  . Sexual activity: Not Currently  Other Topics Concern  . Not on file  Social History Narrative   2 cups coffee per day   Soda daily (1/day)   Lives with husband   Right handed   Social Determinants of Health   Financial Resource Strain: Low Risk   . Difficulty of Paying Living Expenses: Not hard at all  Food Insecurity: No Food Insecurity  . Worried About Charity fundraiser in the Last Year: Never true  . Ran Out of Food in the Last Year: Never true  Transportation Needs: No Transportation Needs  . Lack of Transportation  (Medical): No  . Lack of Transportation (Non-Medical): No  Physical Activity: Inactive  . Days of Exercise per Week: 0 days  . Minutes of Exercise per Session: 0 min  Stress: No Stress Concern Present  . Feeling of Stress : Not at all  Social Connections: Moderately Integrated  . Frequency of Communication with Friends and Family: More than three times a week  . Frequency of Social Gatherings with Friends and Family: Once a week  . Attends Religious Services: More than 4 times per year  . Active Member of Clubs or Organizations: No  . Attends Archivist Meetings: Never  . Marital Status: Married    Tobacco Counseling Counseling given: Not Answered   Clinical Intake:  Pre-visit preparation completed: Yes  Pain : No/denies pain     Nutritional Risks: Nausea/ vomitting/ diarrhea Diabetes: No  How often do you need to have someone help you when you read instructions, pamphlets, or other written materials from your doctor or pharmacy?: 1 - Never What  is the last grade level you completed in school?: 1 year college  Diabetic?No  Interpreter Needed?: No  Information entered by :: [place] of Daily Living In your present state of health, do you have any difficulty performing the following activities: 09/03/2020  Hearing? N  Vision? N  Difficulty concentrating or making decisions? N  Walking or climbing stairs? N  Dressing or bathing? N  Doing errands, shopping? N  Preparing Food and eating ? N  Using the Toilet? N  In the past six months, have you accidently leaked urine? N  Do you have problems with loss of bowel control? Y  Comment has had issues with controlling bowel in the past 6 months but issue has subsided  Managing your Medications? N  Managing your Finances? N  Housekeeping or managing your Housekeeping? N  Some recent data might be hidden    Patient Care Team: [name], MD as PCP - General (Family Medicine)  Indicate  any recent Medical Services you may have received from other than [facility] providers in the past year (date may be approximate).     Assessment:   This is a routine wellness examination for [place].  Hearing/Vision screen  Hearing Screening   125Hz  250Hz  500Hz  1000Hz  2000Hz  3000Hz  4000Hz  6000Hz  8000Hz   Right ear:           Left ear:           Vision Screening Comments: Patient states gets eyes checked once per year.  Dietary issues and exercise activities discussed: Current Exercise Habits: The patient does not participate in regular exercise at present  Goals    . Exercise 3x per week (30 min per time)    . Patient Stated     Maintain your health and continue to take multi vitamins  And walk the dog     . Patient Stated     To learn more about kale  Add to salad and learn more about how to integrate in the diet       Depression Screen PHQ 2/9 Scores 09/03/2020 10/05/2018 10/28/2017 09/17/2017 04/07/2016 03/08/2015 10/23/2014  PHQ - 2 Score 0 0 0 0 0 0 0  PHQ- 9 Score 0 - - - - - -    Fall Risk Fall Risk  09/03/2020 10/05/2018 10/28/2017 09/17/2017 04/07/2016  Falls in the past year? 0 0 No No No  Number falls in past yr: 0 - - - -  Injury with Fall? 0 - - - -  Risk for fall due to : No Fall Risks - - - -  Follow up Falls evaluation completed;Falls prevention discussed - - - -    Any stairs in or around the home? Yes  If so, are there any without handrails? No  Home free of loose throw rugs in walkways, pet beds, electrical cords, etc? Yes  Adequate lighting in your home to reduce risk of falls? Yes   ASSISTIVE DEVICES UTILIZED TO PREVENT FALLS:  Life alert? Yes  Use of a cane, [place] or w/c? No  Grab bars in the bathroom? Yes  Shower chair or bench in shower? Yes  Elevated toilet seat or a handicapped toilet? No     Cognitive Function: Cognitive screening not indicated based on direct observation. MMSE - Mini Mental State Exam 10/05/2018 09/17/2017  Not completed: (No  Data) (No Data)        Immunizations Immunization History  Administered Date(s) Administered  . Fluad Quad(high Dose 65+)  07/22/2019  . Influenza Split 08/02/2010, 08/15/2012  . Influenza, High Dose Seasonal PF 08/20/2012, 09/19/2014, 07/01/2016, 08/17/2017, 09/03/2018  . Influenza-Unspecified 08/31/2017  . PFIZER SARS-COV-2 Vaccination 11/28/2019, 12/17/2019, 08/31/2020  . Pneumococcal Conjugate-13 05/01/2016  . Pneumococcal Polysaccharide-23 11/02/2004, 09/17/2017  . Tdap 11/03/2007, 11/15/2008  . Zoster 11/02/2005    TDAP status: Due, Education has been provided regarding the importance of this vaccine. Advised may receive this vaccine at local pharmacy or Health Dept. Aware to provide a copy of the vaccination record if obtained from local pharmacy or Health Dept. Verbalized acceptance and understanding. Flu Vaccine status: Declined, Education has been provided regarding the importance of this vaccine but patient still declined. Advised may receive this vaccine at local pharmacy or Health Dept. Aware to provide a copy of the vaccination record if obtained from local pharmacy or Health Dept. Verbalized acceptance and understanding. Pneumococcal vaccine status: Up to date Covid-19 vaccine status: Completed vaccines  Qualifies for Shingles Vaccine? Yes   Zostavax completed Yes   Shingrix Completed?: No.    Education has been provided regarding the importance of this vaccine. Patient has been advised to call insurance company to determine out of pocket expense if they have not yet received this vaccine. Advised may also receive vaccine at local pharmacy or Health Dept. Verbalized acceptance and understanding.  Screening Tests Health Maintenance  Topic Date Due  . INFLUENZA VACCINE  10/15/2020 (Originally 06/02/2020)  . MAMMOGRAM  01/13/2021 (Originally 02/10/2019)  . TETANUS/TDAP  01/13/2021 (Originally 11/15/2018)  . Fecal DNA (Cologuard)  01/17/2021  . DEXA SCAN  Completed  .  COVID-19 Vaccine  Completed  . Hepatitis C Screening  Completed  . PNA vac Low Risk Adult  Completed    Health Maintenance  There are no preventive care reminders to display for this patient.  Colorectal cancer screening: Completed cologuard on 01/17/2018. Repeat every 3 years Mammogram status: Ordered 09/03/2020. Pt provided with contact info and advised to call to schedule appt.  Bone Density status: Completed 12/22/2017. Results reflect: Bone density results: OSTEOPENIA. Repeat every 5 years.  Lung Cancer Screening: (Low Dose CT Chest recommended if Age 56-80 years, 30 pack-year currently smoking OR have quit w/in 15years.) does not qualify.   Lung Cancer Screening Referral: N/A   Additional Screening:  Hepatitis C Screening: does qualify; Completed 10/28/2017  Vision Screening: Recommended annual ophthalmology exams for early detection of glaucoma and other disorders of the eye. Is the patient up to date with their annual eye exam?  Yes  Who is the provider or what is the name of the office in which the patient attends annual eye exams? Dr.Jon Nicki Reaper  If pt is not established with a provider, would they like to be referred to a provider to establish care? No .   Dental Screening: Recommended annual dental exams for proper oral hygiene  Community Resource Referral / Chronic Care Management: CRR required this visit?  No   CCM required this visit?  No      Plan:     I have personally reviewed and noted the following in the patient's chart:   . Medical and social history . Use of alcohol, tobacco or illicit drugs  . Current medications and supplements . Functional ability and status . Nutritional status . Physical activity . Advanced directives . List of other physicians . Hospitalizations, surgeries, and ER visits in previous 12 months . Vitals . Screenings to include cognitive, depression, and falls . Referrals and appointments  In addition, I have  reviewed and  discussed with patient certain preventive protocols, quality metrics, and best practice recommendations. A written personalized care plan for preventive services as well as general preventive health recommendations were provided to patient.     Ofilia Neas, LPN   04/08/7033   Nurse Notes: None

## 2020-09-04 ENCOUNTER — Ambulatory Visit: Payer: Medicare HMO | Admitting: Internal Medicine

## 2020-09-19 DIAGNOSIS — R69 Illness, unspecified: Secondary | ICD-10-CM | POA: Diagnosis not present

## 2020-10-03 DIAGNOSIS — H524 Presbyopia: Secondary | ICD-10-CM | POA: Diagnosis not present

## 2020-10-05 ENCOUNTER — Other Ambulatory Visit: Payer: Self-pay | Admitting: Family Medicine

## 2020-10-16 DIAGNOSIS — E039 Hypothyroidism, unspecified: Secondary | ICD-10-CM | POA: Diagnosis not present

## 2020-10-16 DIAGNOSIS — M1990 Unspecified osteoarthritis, unspecified site: Secondary | ICD-10-CM | POA: Diagnosis not present

## 2020-10-16 DIAGNOSIS — G8929 Other chronic pain: Secondary | ICD-10-CM | POA: Diagnosis not present

## 2020-10-16 DIAGNOSIS — K219 Gastro-esophageal reflux disease without esophagitis: Secondary | ICD-10-CM | POA: Diagnosis not present

## 2020-10-16 DIAGNOSIS — I10 Essential (primary) hypertension: Secondary | ICD-10-CM | POA: Diagnosis not present

## 2020-10-16 DIAGNOSIS — Z792 Long term (current) use of antibiotics: Secondary | ICD-10-CM | POA: Diagnosis not present

## 2020-10-16 DIAGNOSIS — N390 Urinary tract infection, site not specified: Secondary | ICD-10-CM | POA: Diagnosis not present

## 2020-10-16 DIAGNOSIS — Z823 Family history of stroke: Secondary | ICD-10-CM | POA: Diagnosis not present

## 2020-10-16 DIAGNOSIS — Z8249 Family history of ischemic heart disease and other diseases of the circulatory system: Secondary | ICD-10-CM | POA: Diagnosis not present

## 2020-10-21 DIAGNOSIS — U071 COVID-19: Secondary | ICD-10-CM | POA: Diagnosis not present

## 2020-10-21 DIAGNOSIS — R059 Cough, unspecified: Secondary | ICD-10-CM | POA: Diagnosis not present

## 2020-10-23 DIAGNOSIS — U071 COVID-19: Secondary | ICD-10-CM | POA: Diagnosis not present

## 2020-11-03 DIAGNOSIS — Z1159 Encounter for screening for other viral diseases: Secondary | ICD-10-CM | POA: Diagnosis not present

## 2020-11-03 DIAGNOSIS — R0602 Shortness of breath: Secondary | ICD-10-CM | POA: Diagnosis not present

## 2020-11-03 DIAGNOSIS — Z8616 Personal history of COVID-19: Secondary | ICD-10-CM | POA: Diagnosis not present

## 2021-02-09 DIAGNOSIS — Z6821 Body mass index (BMI) 21.0-21.9, adult: Secondary | ICD-10-CM | POA: Diagnosis not present

## 2021-02-09 DIAGNOSIS — J018 Other acute sinusitis: Secondary | ICD-10-CM | POA: Diagnosis not present

## 2021-02-09 DIAGNOSIS — J302 Other seasonal allergic rhinitis: Secondary | ICD-10-CM | POA: Diagnosis not present

## 2021-02-21 ENCOUNTER — Other Ambulatory Visit: Payer: Self-pay

## 2021-02-21 ENCOUNTER — Ambulatory Visit (INDEPENDENT_AMBULATORY_CARE_PROVIDER_SITE_OTHER): Payer: Medicare HMO | Admitting: Family Medicine

## 2021-02-21 ENCOUNTER — Encounter: Payer: Self-pay | Admitting: Family Medicine

## 2021-02-21 VITALS — BP 120/85 | HR 93 | Temp 98.2°F | Wt 126.3 lb

## 2021-02-21 DIAGNOSIS — R0609 Other forms of dyspnea: Secondary | ICD-10-CM

## 2021-02-21 DIAGNOSIS — Z8616 Personal history of COVID-19: Secondary | ICD-10-CM | POA: Diagnosis not present

## 2021-02-21 DIAGNOSIS — R0600 Dyspnea, unspecified: Secondary | ICD-10-CM | POA: Diagnosis not present

## 2021-02-21 NOTE — Patient Instructions (Signed)
Shortness of Breath, Adult Shortness of breath is when a person has trouble breathing enough air or when a person feels like she or he is having trouble breathing in enough air. Shortness of breath could be a sign of a medical problem. Follow these instructions at home:  Pay attention to any changes in your symptoms.  Do not use any products that contain nicotine or tobacco, such as cigarettes, e-cigarettes, and chewing tobacco.  Do not smoke. Smoking is a common cause of shortness of breath. If you need help quitting, ask your health care provider.  Avoid things that can irritate your airways, such as: ? Mold. ? Dust. ? Air pollution. ? Chemical fumes. ? Things that can cause allergy symptoms (allergens), if you have allergies.  Keep your living space clean and free of mold and dust.  Rest as needed. Slowly return to your usual activities.  Take over-the-counter and prescription medicines only as told by your health care provider. This includes oxygen therapy and inhaled medicines.  Keep all follow-up visits as told by your health care provider. This is important.   Contact a health care provider if:  Your condition does not improve as soon as expected.  You have a hard time doing your normal activities, even after you rest.  You have new symptoms. Get help right away if:  Your shortness of breath gets worse.  You have shortness of breath when you are resting.  You feel light-headed or you faint.  You have a cough that is not controlled with medicines.  You cough up blood.  You have pain with breathing.  You have pain in your chest, arms, shoulders, or abdomen.  You have a fever.  You cannot walk up stairs or exercise the way that you normally do. These symptoms may represent a serious problem that is an emergency. Do not wait to see if the symptoms will go away. Get medical help right away. Call your local emergency services (911 in the [place] Do not drive yourself  to the hospital. Summary  Shortness of breath is when a person has trouble breathing enough air. It can be a sign of a medical problem.  Avoid things that irritate your lungs, such as smoking, pollution, mold, and dust.  Pay attention to changes in your symptoms and contact your health care provider if you have a hard time completing daily activities because of shortness of breath. This information is not intended to replace advice given to you by your health care provider. Make sure you discuss any questions you have with your health care provider. Document Revised: 03/21/2018 Document Reviewed: 03/21/2018 Elsevier Patient Education  2021 [employer].

## 2021-02-21 NOTE — Progress Notes (Signed)
Established Patient Office Visit  Subjective:  Patient ID: Tonya Hamilton, female    DOB: Jul 23, 1946  Age: 75 y.o. MRN: [id number]  CC:  Chief Complaint  Patient presents with  . Fatigue    Covid positive in dec 2021, pt complains she is still experiencing extreme fatigue and SOB    HPI Tonya Hamilton presents for exertional dyspnea since COVID infection last winter.  She has chronic problems including history of GERD, hypothyroidism, hypertension, spasmodic dysphonia, osteoarthritis  She tested positive for COVID back in December.  She states she has had some dyspnea since then with activities such as stair climbing.  She is able to walk her dog on level ground about 1/4 mile without difficulty but has difficulty with walking at a faster pace or negotiating hills or stairs.  No chest pain.  No peripheral edema.  No orthopnea.  She went to urgent care and apparently back in early January had multiple tests done there including CBC, CMP, chest x-ray, EKG, spirometry and she was told these were all normal.  She rates her dyspnea 3 on a scale of 10.  Her appetite is actually increased some.  She has had frequent loose stools.  She went to another group and was given tentative diagnosis of celiac disease.  She questions this because she states she is able to eat gluten containing foods without difficulty.  She has been having frequent loose stools and they seem to be improved currently.  Her weight is back up some.  Past Medical History:  Diagnosis Date  . Arthritis   . Colon polyps   . Diverticulitis   . Hyperlipidemia   . Hypertension   . Hypothyroidism   . PONV (postoperative nausea and vomiting)   . Thyroid disease   . UTI (urinary tract infection)     Past Surgical History:  Procedure Laterality Date  . ABDOMINAL HYSTERECTOMY  1993   TAH, endometriosis  . CHOLECYSTECTOMY  2002  . TOTAL HIP ARTHROPLASTY Right 05/31/2013   Procedure: RIGHT TOTAL HIP ARTHROPLASTY ANTERIOR  APPROACH;  Surgeon: Gearlean Alf, MD;  Location: [facility];  Service: Orthopedics;  Laterality: Right;    Family History  Problem Relation Age of Onset  . Arthritis Mother   . Hypertension Mother   . Heart disease Mother   . Hypertension Father   . Stroke Father   . Heart disease Father   . Heart Problems Maternal Grandfather     Social History   Socioeconomic History  . Marital status: Married    Spouse name: Juanda Crumble   . Number of children: 2  . Years of education: 62  . Highest education level: Not on file  Occupational History  . Not on file  Tobacco Use  . Smoking status: Never Smoker  . Smokeless tobacco: Never Used  Vaping Use  . Vaping Use: Never used  Substance and Sexual Activity  . Alcohol use: No  . Drug use: No  . Sexual activity: Not Currently  Other Topics Concern  . Not on file  Social History Narrative   2 cups coffee per day   Soda daily (1/day)   Lives with husband   Right handed   Social Determinants of Health   Financial Resource Strain: Low Risk   . Difficulty of Paying Living Expenses: Not hard at all  Food Insecurity: No Food Insecurity  . Worried About Charity fundraiser in the Last Year: Never true  . Ran Out of Food  in the Last Year: Never true  Transportation Needs: No Transportation Needs  . Lack of Transportation (Medical): No  . Lack of Transportation (Non-Medical): No  Physical Activity: Inactive  . Days of Exercise per Week: 0 days  . Minutes of Exercise per Session: 0 min  Stress: No Stress Concern Present  . Feeling of Stress : Not at all  Social Connections: Moderately Integrated  . Frequency of Communication with Friends and Family: More than three times a week  . Frequency of Social Gatherings with Friends and Family: Once a week  . Attends Religious Services: More than 4 times per year  . Active Member of Clubs or Organizations: No  . Attends Archivist Meetings: Never  . Marital Status: Married  Arboriculturist Violence: Not At Risk  . Fear of Current or Ex-Partner: No  . Emotionally Abused: No  . Physically Abused: No  . Sexually Abused: No    Outpatient Medications Prior to Visit  Medication Sig Dispense Refill  . amLODipine (NORVASC) 5 MG tablet TAKE 1 TABLET BY MOUTH EVERY DAY 90 tablet 3  . aspirin-acetaminophen-caffeine (EXCEDRIN MIGRAINE) 818-299-37 MG tablet Take 2 tablets by mouth every 6 (six) hours as needed for headache.    . Bacillus Coagulans-Inulin (PROBIOTIC) 1-250 BILLION-MG CAPS Take 1 capsule by mouth daily. 30 capsule 0  . bismuth subsalicylate (PEPTO BISMOL) 262 MG/15ML suspension Take 30 mLs by mouth every 6 (six) hours as needed for indigestion or diarrhea or loose stools.    . ciclopirox (LOPROX) 0.77 % cream Apply topically 2 (two) times daily. 15 g 1  . diphenhydramine-acetaminophen (TYLENOL PM) 25-500 MG TABS tablet Take 2 tablets by mouth at bedtime as needed (sleep/pain).    . fluticasone (FLONASE) 50 MCG/ACT nasal spray Place 1-2 sprays into both nostrils daily as needed for allergies or rhinitis.    Marland Kitchen ibuprofen (ADVIL) 200 MG tablet Take 400 mg by mouth every 6 (six) hours as needed for headache or mild pain.    Marland Kitchen imipramine (TOFRANIL) 10 MG tablet TAKE 1-2 TABLETS BY MOUTH AT BEDTIME 180 tablet 2  . levothyroxine (SYNTHROID) 25 MCG tablet TAKE 1 TABLET BY MOUTH EVERY DAY 90 tablet 3  . meloxicam (MOBIC) 7.5 MG tablet Take 1 tablet (7.5 mg total) by mouth daily. 30 tablet 1  . ondansetron (ZOFRAN ODT) 4 MG disintegrating tablet Take 1 tablet (4 mg total) by mouth every 8 (eight) hours as needed for nausea or vomiting. 10 tablet 0  . ondansetron (ZOFRAN) 4 MG tablet TAKE 1 TABLET BY MOUTH EVERY 8 HOURS AS NEEDED FOR NAUSEA AND VOMITING 20 tablet 0  . pantoprazole (PROTONIX) 40 MG tablet Take 40 mg by mouth daily.    Marland Kitchen trimethoprim (TRIMPEX) 100 MG tablet TAKE 1 TABLET BY MOUTH EVERY DAY 90 tablet 3   No facility-administered medications prior to visit.     Allergies  Allergen Reactions  . Botox [Onabotulinumtoxina]     Was given too much botox and at night, felt like her throat was closing up.   . Diclofenac   . Lisinopril Swelling  . Simvastatin   . Statins     Body aches all over  . Sulfa Antibiotics Itching    ROS Review of Systems  Constitutional: Negative for appetite change and unexpected weight change.  Respiratory: Positive for shortness of breath. Negative for cough and wheezing.   Cardiovascular: Negative for chest pain, palpitations and leg swelling.  Neurological: Negative for dizziness.  Objective:    Physical Exam Vitals reviewed.  Constitutional:      Appearance: Normal appearance.  Cardiovascular:     Rate and Rhythm: Normal rate and regular rhythm.  Pulmonary:     Effort: Pulmonary effort is normal.     Breath sounds: Normal breath sounds. No wheezing or rales.  Musculoskeletal:     Right lower leg: No edema.     Left lower leg: No edema.  Neurological:     Mental Status: She is alert.     BP 120/85 (BP Location: Left Arm, Patient Position: Sitting, Cuff Size: Normal)   Pulse 93   Temp 98.2 F (36.8 C) (Oral)   Wt 126 lb 4.8 oz (57.3 kg)   SpO2 96%   BMI 21.68 kg/m  Wt Readings from Last 3 Encounters:  02/21/21 126 lb 4.8 oz (57.3 kg)  09/03/20 120 lb (54.4 kg)  07/16/20 125 lb 8 oz (56.9 kg)     Health Maintenance Due  Topic Date Due  . TETANUS/TDAP  11/15/2018  . MAMMOGRAM  02/10/2019  . Fecal DNA (Cologuard)  01/17/2021    There are no preventive care reminders to display for this patient.  Lab Results  Component Value Date   TSH 2.12 10/13/2019   Lab Results  Component Value Date   WBC 8.5 07/18/2020   HGB 12.8 07/18/2020   HCT 38.6 07/18/2020   MCV 88.9 07/18/2020   PLT 384 07/18/2020   Lab Results  Component Value Date   NA 139 07/18/2020   K 4.1 07/18/2020   CO2 23 07/18/2020   GLUCOSE 112 (H) 07/18/2020   BUN 7 07/18/2020   CREATININE 0.65 07/18/2020    BILITOT 0.4 07/18/2020   ALKPHOS 62 06/10/2020   AST 23 07/18/2020   ALT 18 07/18/2020   PROT 6.7 07/18/2020   ALBUMIN 4.1 06/10/2020   CALCIUM 9.1 07/18/2020   ANIONGAP 12 06/10/2020   GFR 77.19 10/28/2017   Lab Results  Component Value Date   CHOL 190 10/28/2017   Lab Results  Component Value Date   HDL 39.00 (L) 10/28/2017   Lab Results  Component Value Date   LDLCALC 131 (H) 10/28/2017   Lab Results  Component Value Date   TRIG 101.0 10/28/2017   Lab Results  Component Value Date   CHOLHDL 5 10/28/2017   No results found for: HGBA1C    Assessment & Plan:   Problem List Items Addressed This Visit   None   Visit Diagnoses    Exertional dyspnea    -  Primary   Relevant Orders   ECHOCARDIOGRAM COMPLETE   History of COVID-19        Patient relates some exertional dyspnea ever since COVID infection months ago.  She is able to carry out most of her activities of daily living but has dyspnea with things like stairs.  Reportedly had chest x-ray and EKG at urgent care which were normal.  Recent labs reportedly normal.  -Recommend echocardiogram to further assess.  If this is normal will recommend graded /gradual return to exercise and activities  No orders of the defined types were placed in this encounter.   Follow-up: No follow-ups on file.    Carolann Littler, MD

## 2021-04-03 DIAGNOSIS — H35363 Drusen (degenerative) of macula, bilateral: Secondary | ICD-10-CM | POA: Diagnosis not present

## 2021-04-09 ENCOUNTER — Ambulatory Visit (INDEPENDENT_AMBULATORY_CARE_PROVIDER_SITE_OTHER): Payer: Medicare HMO | Admitting: Family Medicine

## 2021-04-09 ENCOUNTER — Other Ambulatory Visit: Payer: Self-pay

## 2021-04-09 ENCOUNTER — Encounter: Payer: Self-pay | Admitting: Family Medicine

## 2021-04-09 VITALS — BP 124/58 | HR 91 | Temp 98.3°F | Wt 127.6 lb

## 2021-04-09 DIAGNOSIS — R300 Dysuria: Secondary | ICD-10-CM

## 2021-04-09 LAB — POCT URINALYSIS DIPSTICK
Bilirubin, UA: NEGATIVE
Blood, UA: POSITIVE
Glucose, UA: NEGATIVE
Ketones, UA: NEGATIVE
Nitrite, UA: NEGATIVE
Protein, UA: POSITIVE — AB
Spec Grav, UA: 1.015 (ref 1.010–1.025)
Urobilinogen, UA: 0.2 E.U./dL
pH, UA: 6 (ref 5.0–8.0)

## 2021-04-09 MED ORDER — CEPHALEXIN 500 MG PO CAPS: 500.0000 mg | ORAL_CAPSULE | Freq: Three times a day (TID) | ORAL | 0 refills | Status: DC

## 2021-04-09 NOTE — Patient Instructions (Signed)
Urinary Tract Infection, Adult  A urinary tract infection (UTI) is an infection of any part of the urinary tract. The urinary tract includes the kidneys, ureters, bladder, and urethra. These organs make, store, and get rid of urine in the body. An upper UTI affects the ureters and kidneys. A lower UTI affects the bladder and urethra. What are the causes? Most urinary tract infections are caused by bacteria in your genital area around your urethra, where urine leaves your body. These bacteria grow and cause inflammation of your urinary tract. What increases the risk? You are more likely to develop this condition if:  You have a urinary catheter that stays in place.  You are not able to control when you urinate or have a bowel movement (incontinence).  You are female and you: ? Use a spermicide or diaphragm for birth control. ? Have low estrogen levels. ? Are pregnant.  You have certain genes that increase your risk.  You are sexually active.  You take antibiotic medicines.  You have a condition that causes your flow of urine to slow down, such as: ? An enlarged prostate, if you are female. ? Blockage in your urethra. ? A kidney stone. ? A nerve condition that affects your bladder control (neurogenic bladder). ? Not getting enough to drink, or not urinating often.  You have certain medical conditions, such as: ? Diabetes. ? A weak disease-fighting system (immunesystem). ? Sickle cell disease. ? Gout. ? Spinal cord injury. What are the signs or symptoms? Symptoms of this condition include:  Needing to urinate right away (urgency).  Frequent urination. This may include small amounts of urine each time you urinate.  Pain or burning with urination.  Blood in the urine.  Urine that smells bad or unusual.  Trouble urinating.  Cloudy urine.  Vaginal discharge, if you are female.  Pain in the abdomen or the lower back. You may also have:  Vomiting or a decreased  appetite.  Confusion.  Irritability or tiredness.  A fever or chills.  Diarrhea. The first symptom in older adults may be confusion. In some cases, they may not have any symptoms until the infection has worsened. How is this diagnosed? This condition is diagnosed based on your medical history and a physical exam. You may also have other tests, including:  Urine tests.  Blood tests.  Tests for STIs (sexually transmitted infections). If you have had more than one UTI, a cystoscopy or imaging studies may be done to determine the cause of the infections. How is this treated? Treatment for this condition includes:  Antibiotic medicine.  Over-the-counter medicines to treat discomfort.  Drinking enough water to stay hydrated. If you have frequent infections or have other conditions such as a kidney stone, you may need to see a health care provider who specializes in the urinary tract (urologist). In rare cases, urinary tract infections can cause sepsis. Sepsis is a life-threatening condition that occurs when the body responds to an infection. Sepsis is treated in the hospital with IV antibiotics, fluids, and other medicines. Follow these instructions at home: Medicines  Take over-the-counter and prescription medicines only as told by your health care provider.  If you were prescribed an antibiotic medicine, take it as told by your health care provider. Do not stop using the antibiotic even if you start to feel better. General instructions  Make sure you: ? Empty your bladder often and completely. Do not hold urine for long periods of time. ? Empty your bladder after   sex. ? Wipe from front to back after urinating or having a bowel movement if you are female. Use each tissue only one time when you wipe.  Drink enough fluid to keep your urine pale yellow.  Keep all follow-up visits. This is important.   Contact a health care provider if:  Your symptoms do not get better after 1-2  days.  Your symptoms go away and then return. Get help right away if:  You have severe pain in your back or your lower abdomen.  You have a fever or chills.  You have nausea or vomiting. Summary  A urinary tract infection (UTI) is an infection of any part of the urinary tract, which includes the kidneys, ureters, bladder, and urethra.  Most urinary tract infections are caused by bacteria in your genital area.  Treatment for this condition often includes antibiotic medicines.  If you were prescribed an antibiotic medicine, take it as told by your health care provider. Do not stop using the antibiotic even if you start to feel better.  Keep all follow-up visits. This is important. This information is not intended to replace advice given to you by your health care provider. Make sure you discuss any questions you have with your health care provider. Document Revised: 05/31/2020 Document Reviewed: 05/31/2020 Elsevier Patient Education  2021 [employer]  

## 2021-04-09 NOTE — Progress Notes (Signed)
Established Patient Office Visit  Subjective:  Patient ID: Tonya Hamilton, female    DOB: [birth year]  Age: 75 y.o. MRN: [id number]  CC:  Chief Complaint  Patient presents with  . Urinary Tract Infection    Dysuria, headaches, low back pain    HPI Tonya Hamilton presents for burning with urination and urine frequency for past several days.  She also relates some low back pain and states she has had similar pains in the past with UTI.  Mild headache.  No fever.  No nausea or vomiting.  She has history of frequent UTIs in the past and actually is on low-dose trimethoprim which seems to work well for preventing UTIs.  Her last documented UTI was around 2018.  Denies any gross hematuria.  She is staying well-hydrated.  She has allergy to sulfa.  Has tolerated Cipro and Keflex in the past.  Past Medical History:  Diagnosis Date  . Arthritis   . Colon polyps   . Diverticulitis   . Hyperlipidemia   . Hypertension   . Hypothyroidism   . PONV (postoperative nausea and vomiting)   . Thyroid disease   . UTI (urinary tract infection)     Past Surgical History:  Procedure Laterality Date  . ABDOMINAL HYSTERECTOMY  1993   TAH, endometriosis  . CHOLECYSTECTOMY  2002  . TOTAL HIP ARTHROPLASTY Right 05/31/2013   Procedure: RIGHT TOTAL HIP ARTHROPLASTY ANTERIOR APPROACH;  Surgeon: Gearlean Alf, MD;  Location: [facility];  Service: Orthopedics;  Laterality: Right;    Family History  Problem Relation Age of Onset  . Arthritis Mother   . Hypertension Mother   . Heart disease Mother   . Hypertension Father   . Stroke Father   . Heart disease Father   . Heart Problems Maternal Grandfather     Social History   Socioeconomic History  . Marital status: Married    Spouse name: Juanda Crumble   . Number of children: 2  . Years of education: 63  . Highest education level: Not on file  Occupational History  . Not on file  Tobacco Use  . Smoking status: Never Smoker  . Smokeless tobacco:  Never Used  Vaping Use  . Vaping Use: Never used  Substance and Sexual Activity  . Alcohol use: No  . Drug use: No  . Sexual activity: Not Currently  Other Topics Concern  . Not on file  Social History Narrative   2 cups coffee per day   Soda daily (1/day)   Lives with husband   Right handed   Social Determinants of Health   Financial Resource Strain: Low Risk   . Difficulty of Paying Living Expenses: Not hard at all  Food Insecurity: No Food Insecurity  . Worried About Charity fundraiser in the Last Year: Never true  . Ran Out of Food in the Last Year: Never true  Transportation Needs: No Transportation Needs  . Lack of Transportation (Medical): No  . Lack of Transportation (Non-Medical): No  Physical Activity: Inactive  . Days of Exercise per Week: 0 days  . Minutes of Exercise per Session: 0 min  Stress: No Stress Concern Present  . Feeling of Stress : Not at all  Social Connections: Moderately Integrated  . Frequency of Communication with Friends and Family: More than three times a week  . Frequency of Social Gatherings with Friends and Family: Once a week  . Attends Religious Services: More than 4 times per year  .  Active Member of Clubs or Organizations: No  . Attends Archivist Meetings: Never  . Marital Status: Married  Human resources officer Violence: Not At Risk  . Fear of Current or Ex-Partner: No  . Emotionally Abused: No  . Physically Abused: No  . Sexually Abused: No    Outpatient Medications Prior to Visit  Medication Sig Dispense Refill  . amLODipine (NORVASC) 5 MG tablet TAKE 1 TABLET BY MOUTH EVERY DAY 90 tablet 3  . aspirin-acetaminophen-caffeine (EXCEDRIN MIGRAINE) 119-417-40 MG tablet Take 2 tablets by mouth every 6 (six) hours as needed for headache.    . Bacillus Coagulans-Inulin (PROBIOTIC) 1-250 BILLION-MG CAPS Take 1 capsule by mouth daily. 30 capsule 0  . bismuth subsalicylate (PEPTO BISMOL) 262 MG/15ML suspension Take 30 mLs by mouth  every 6 (six) hours as needed for indigestion or diarrhea or loose stools.    . ciclopirox (LOPROX) 0.77 % cream Apply topically 2 (two) times daily. 15 g 1  . diphenhydramine-acetaminophen (TYLENOL PM) 25-500 MG TABS tablet Take 2 tablets by mouth at bedtime as needed (sleep/pain).    . fluticasone (FLONASE) 50 MCG/ACT nasal spray Place 1-2 sprays into both nostrils daily as needed for allergies or rhinitis.    Marland Kitchen ibuprofen (ADVIL) 200 MG tablet Take 400 mg by mouth every 6 (six) hours as needed for headache or mild pain.    Marland Kitchen imipramine (TOFRANIL) 10 MG tablet TAKE 1-2 TABLETS BY MOUTH AT BEDTIME 180 tablet 2  . levothyroxine (SYNTHROID) 25 MCG tablet TAKE 1 TABLET BY MOUTH EVERY DAY 90 tablet 3  . meloxicam (MOBIC) 7.5 MG tablet Take 1 tablet (7.5 mg total) by mouth daily. 30 tablet 1  . ondansetron (ZOFRAN ODT) 4 MG disintegrating tablet Take 1 tablet (4 mg total) by mouth every 8 (eight) hours as needed for nausea or vomiting. 10 tablet 0  . ondansetron (ZOFRAN) 4 MG tablet TAKE 1 TABLET BY MOUTH EVERY 8 HOURS AS NEEDED FOR NAUSEA AND VOMITING 20 tablet 0  . pantoprazole (PROTONIX) 40 MG tablet Take 40 mg by mouth daily.    Marland Kitchen trimethoprim (TRIMPEX) 100 MG tablet TAKE 1 TABLET BY MOUTH EVERY DAY 90 tablet 3   No facility-administered medications prior to visit.    Allergies  Allergen Reactions  . Botox [Onabotulinumtoxina]     Was given too much botox and at night, felt like her throat was closing up.   . Diclofenac   . Lisinopril Swelling  . Simvastatin   . Statins     Body aches all over  . Sulfa Antibiotics Itching    ROS Review of Systems  Constitutional: Negative for chills and fever.  Gastrointestinal: Negative for nausea and vomiting.  Genitourinary: Positive for dysuria. Negative for flank pain and hematuria.  Musculoskeletal: Positive for back pain.      Objective:    Physical Exam Vitals reviewed.  Constitutional:      General: She is not in acute distress.     Appearance: Normal appearance. She is not ill-appearing or toxic-appearing.  Cardiovascular:     Rate and Rhythm: Normal rate and regular rhythm.  Pulmonary:     Effort: Pulmonary effort is normal.     Breath sounds: Normal breath sounds.  Neurological:     Mental Status: She is alert.     BP (!) 124/58 (BP Location: Left Arm, Patient Position: Sitting, Cuff Size: Normal)   Pulse 91   Temp 98.3 F (36.8 C) (Oral)   Wt 127 lb 9.6 oz (  57.9 kg)   SpO2 96%   BMI 21.90 kg/m  Wt Readings from Last 3 Encounters:  04/09/21 127 lb 9.6 oz (57.9 kg)  02/21/21 126 lb 4.8 oz (57.3 kg)  09/03/20 120 lb (54.4 kg)     Health Maintenance Due  Topic Date Due  . Pneumococcal Vaccine 91-53 Years old (1 of 4 - PCV13) Never done  . Zoster Vaccines- Shingrix (1 of 2) Never done  . TETANUS/TDAP  11/15/2018  . MAMMOGRAM  02/10/2019  . COVID-19 Vaccine (4 - Booster for [place] series) 12/01/2020  . Fecal DNA (Cologuard)  01/17/2021    There are no preventive care reminders to display for this patient.  Lab Results  Component Value Date   TSH 2.12 10/13/2019   Lab Results  Component Value Date   WBC 8.5 07/18/2020   HGB 12.8 07/18/2020   HCT 38.6 07/18/2020   MCV 88.9 07/18/2020   PLT 384 07/18/2020   Lab Results  Component Value Date   NA 139 07/18/2020   K 4.1 07/18/2020   CO2 23 07/18/2020   GLUCOSE 112 (H) 07/18/2020   BUN 7 07/18/2020   CREATININE 0.65 07/18/2020   BILITOT 0.4 07/18/2020   ALKPHOS 62 06/10/2020   AST 23 07/18/2020   ALT 18 07/18/2020   PROT 6.7 07/18/2020   ALBUMIN 4.1 06/10/2020   CALCIUM 9.1 07/18/2020   ANIONGAP 12 06/10/2020   GFR 77.19 10/28/2017   Lab Results  Component Value Date   CHOL 190 10/28/2017   Lab Results  Component Value Date   HDL 39.00 (L) 10/28/2017   Lab Results  Component Value Date   LDLCALC 131 (H) 10/28/2017   Lab Results  Component Value Date   TRIG 101.0 10/28/2017   Lab Results  Component Value Date   CHOLHDL  5 10/28/2017   No results found for: HGBA1C    Assessment & Plan:   Patient presents with several day history of dysuria.  Nontoxic in appearance.  Urine dipstick shows positive leukocytes and positive blood.    -Urine culture sent -Start Keflex 500 mg 3 times daily pending culture results -Stay well-hydrated -Follow-up immediately for any nausea/vomiting, increasing fever, confusion, or other concerns  Meds ordered this encounter  Medications  . cephALEXin (KEFLEX) 500 MG capsule    Sig: Take 1 capsule (500 mg total) by mouth 3 (three) times daily.    Dispense:  21 capsule    Refill:  0    Follow-up: No follow-ups on file.    Carolann Littler, MD

## 2021-04-11 LAB — URINE CULTURE
MICRO NUMBER:: 11983908
SPECIMEN QUALITY:: ADEQUATE

## 2021-05-12 ENCOUNTER — Other Ambulatory Visit: Payer: Self-pay

## 2021-05-12 ENCOUNTER — Ambulatory Visit (HOSPITAL_COMMUNITY)
Admission: RE | Admit: 2021-05-12 | Discharge: 2021-05-12 | Disposition: A | Discharge status: Home / Self Care | Payer: Medicare HMO | Source: Ambulatory Visit | Attending: Family Medicine | Admitting: Family Medicine

## 2021-05-12 DIAGNOSIS — R0609 Other forms of dyspnea: Secondary | ICD-10-CM

## 2021-05-12 DIAGNOSIS — R0600 Dyspnea, unspecified: Secondary | ICD-10-CM | POA: Diagnosis not present

## 2021-05-12 DIAGNOSIS — E785 Hyperlipidemia, unspecified: Secondary | ICD-10-CM | POA: Diagnosis not present

## 2021-05-12 DIAGNOSIS — I119 Hypertensive heart disease without heart failure: Secondary | ICD-10-CM | POA: Diagnosis not present

## 2021-05-12 LAB — ECHOCARDIOGRAM COMPLETE
Area-P 1/2: 3.89 cm2
S' Lateral: 2.6 cm

## 2021-05-13 ENCOUNTER — Encounter: Payer: Self-pay | Admitting: Family Medicine

## 2021-05-30 ENCOUNTER — Telehealth: Payer: Self-pay | Admitting: Family Medicine

## 2021-05-30 MED ORDER — CEPHALEXIN 500 MG PO CAPS: 500.0000 mg | ORAL_CAPSULE | Freq: Three times a day (TID) | ORAL | 0 refills | Status: DC

## 2021-05-30 NOTE — Addendum Note (Signed)
Addended by: Nilda Riggs on: 05/30/2021 02:35 PM   Modules accepted: Orders

## 2021-05-30 NOTE — Telephone Encounter (Signed)
The patient is in [place] on vacation and has an UTI and wants to know if Dr. Elease Hashimoto can call in something to the pharmacy there.  [facility] Phone:  [phone]  Fax:  [phone]

## 2021-05-30 NOTE — Telephone Encounter (Signed)
RX sent to pts pharmacy.  

## 2021-06-06 ENCOUNTER — Telehealth: Payer: Self-pay | Admitting: Family Medicine

## 2021-06-06 DIAGNOSIS — R195 Other fecal abnormalities: Secondary | ICD-10-CM

## 2021-06-06 NOTE — Telephone Encounter (Signed)
Positive FIT per insurance.  Pt aware.  Setting up GI referral.

## 2021-06-25 DIAGNOSIS — R195 Other fecal abnormalities: Secondary | ICD-10-CM | POA: Diagnosis not present

## 2021-06-25 DIAGNOSIS — Z791 Long term (current) use of non-steroidal anti-inflammatories (NSAID): Secondary | ICD-10-CM | POA: Diagnosis not present

## 2021-08-01 ENCOUNTER — Other Ambulatory Visit: Payer: Self-pay | Admitting: Family Medicine

## 2021-08-01 DIAGNOSIS — Z1231 Encounter for screening mammogram for malignant neoplasm of breast: Secondary | ICD-10-CM

## 2021-08-06 ENCOUNTER — Encounter: Payer: Self-pay | Admitting: Family Medicine

## 2021-08-06 DIAGNOSIS — D123 Benign neoplasm of transverse colon: Secondary | ICD-10-CM | POA: Diagnosis not present

## 2021-08-06 DIAGNOSIS — K649 Unspecified hemorrhoids: Secondary | ICD-10-CM | POA: Diagnosis not present

## 2021-08-06 DIAGNOSIS — R195 Other fecal abnormalities: Secondary | ICD-10-CM | POA: Diagnosis not present

## 2021-08-06 DIAGNOSIS — K5730 Diverticulosis of large intestine without perforation or abscess without bleeding: Secondary | ICD-10-CM | POA: Diagnosis not present

## 2021-08-08 DIAGNOSIS — D123 Benign neoplasm of transverse colon: Secondary | ICD-10-CM | POA: Diagnosis not present

## 2021-08-12 ENCOUNTER — Ambulatory Visit (INDEPENDENT_AMBULATORY_CARE_PROVIDER_SITE_OTHER): Payer: Medicare HMO

## 2021-08-12 DIAGNOSIS — Z0000 Encounter for general adult medical examination without abnormal findings: Secondary | ICD-10-CM

## 2021-08-12 NOTE — Progress Notes (Signed)
Subjective:   Tonya Hamilton is a 75 y.o. female who presents for Medicare Annual (Subsequent) preventive examination.  Patient unable to connect to my chart . I connected with Tonya Hamilton today by telephone and verified that I am speaking with the correct person using two identifiers. Location patient: home Location provider: work Persons participating in the virtual visit: patient, provider.   I discussed the limitations, risks, security and privacy concerns of performing an evaluation and management service by telephone and the availability of in person appointments. I also discussed with the patient that there may be a patient responsible charge related to this service. The patient expressed understanding and verbally consented to this telephonic visit.    Interactive audio and video telecommunications were attempted between this provider and patient, however failed, due to patient having technical difficulties OR patient did not have access to video capability.  We continued and completed visit with audio only.    Review of Systems     Cardiac Risk Factors include: advanced age (>65men, >26 women);hypertension     Objective:    Today's Vitals   There is no height or weight on file to calculate BMI.  Advanced Directives 08/12/2021 09/03/2020 06/10/2020 10/05/2018 09/17/2017 05/31/2013 05/31/2013  Does Patient Have a Medical Advance Directive? Yes Yes Yes Yes Yes Patient has advance directive, copy not in chart -  Type of Advance Directive [place];Living will [place];Living will - - - [place];Living will -  Does patient want to make changes to medical advance directive? - No - Patient declined - - - - -  Copy of [place] in Chart? No - copy requested No - copy requested - - - Copy requested from family -  Pre-existing out of facility DNR order (yellow form or pink MOST form) - - - - - No No    Current  Medications (verified) Outpatient Encounter Medications as of 08/12/2021  Medication Sig   amLODipine (NORVASC) 5 MG tablet TAKE 1 TABLET BY MOUTH EVERY DAY   aspirin-acetaminophen-caffeine (EXCEDRIN MIGRAINE) 250-250-65 MG tablet Take 2 tablets by mouth every 6 (six) hours as needed for headache.   bismuth subsalicylate (PEPTO BISMOL) 262 MG/15ML suspension Take 30 mLs by mouth every 6 (six) hours as needed for indigestion or diarrhea or loose stools.   ciclopirox (LOPROX) 0.77 % cream Apply topically 2 (two) times daily.   diphenhydramine-acetaminophen (TYLENOL PM) 25-500 MG TABS tablet Take 2 tablets by mouth at bedtime as needed (sleep/pain).   fluticasone (FLONASE) 50 MCG/ACT nasal spray Place 1-2 sprays into both nostrils daily as needed for allergies or rhinitis.   ibuprofen (ADVIL) 200 MG tablet Take 400 mg by mouth every 6 (six) hours as needed for headache or mild pain.   imipramine (TOFRANIL) 10 MG tablet TAKE 1-2 TABLETS BY MOUTH AT BEDTIME   levothyroxine (SYNTHROID) 25 MCG tablet TAKE 1 TABLET BY MOUTH EVERY DAY   meloxicam (MOBIC) 7.5 MG tablet Take 1 tablet (7.5 mg total) by mouth daily.   ondansetron (ZOFRAN ODT) 4 MG disintegrating tablet Take 1 tablet (4 mg total) by mouth every 8 (eight) hours as needed for nausea or vomiting.   ondansetron (ZOFRAN) 4 MG tablet TAKE 1 TABLET BY MOUTH EVERY 8 HOURS AS NEEDED FOR NAUSEA AND VOMITING   pantoprazole (PROTONIX) 40 MG tablet Take 40 mg by mouth daily.   Bacillus Coagulans-Inulin (PROBIOTIC) 1-250 BILLION-MG CAPS Take 1 capsule by mouth daily. (Patient not taking:  Reported on 08/12/2021)   cephALEXin (KEFLEX) 500 MG capsule Take 1 capsule (500 mg total) by mouth 3 (three) times daily. (Patient not taking: Reported on 08/12/2021)   trimethoprim (TRIMPEX) 100 MG tablet TAKE 1 TABLET BY MOUTH EVERY DAY (Patient not taking: Reported on 08/12/2021)   No facility-administered encounter medications on file as of 08/12/2021.    Allergies  (verified) Botox [onabotulinumtoxina], Diclofenac, Lisinopril, Simvastatin, Statins, and Sulfa antibiotics   History: Past Medical History:  Diagnosis Date   Arthritis    Colon polyps    Diverticulitis    Hyperlipidemia    Hypertension    Hypothyroidism    PONV (postoperative nausea and vomiting)    Thyroid disease    UTI (urinary tract infection)    Past Surgical History:  Procedure Laterality Date   ABDOMINAL HYSTERECTOMY  1993   TAH, endometriosis   CHOLECYSTECTOMY  2002   TOTAL HIP ARTHROPLASTY Right 05/31/2013   Procedure: RIGHT TOTAL HIP ARTHROPLASTY ANTERIOR APPROACH;  Surgeon: Gearlean Alf, MD;  Location: [facility];  Service: Orthopedics;  Laterality: Right;   Family History  Problem Relation Age of Onset   Arthritis Mother    Hypertension Mother    Heart disease Mother    Hypertension Father    Stroke Father    Heart disease Father    Heart Problems Maternal Grandfather    Social History   Socioeconomic History   Marital status: Married    Spouse name: Juanda Crumble    Number of children: 2   Years of education: 13   Highest education level: Not on file  Occupational History   Not on file  Tobacco Use   Smoking status: Never   Smokeless tobacco: Never  Vaping Use   Vaping Use: Never used  Substance and Sexual Activity   Alcohol use: No   Drug use: No   Sexual activity: Not Currently  Other Topics Concern   Not on file  Social History Narrative   2 cups coffee per day   Soda daily (1/day)   Lives with husband   Right handed   Social Determinants of Health   Financial Resource Strain: Low Risk    Difficulty of Paying Living Expenses: Not hard at all  Food Insecurity: No Food Insecurity   Worried About Charity fundraiser in the Last Year: Never true   [place] in the Last Year: Never true  Transportation Needs: No Transportation Needs   Lack of Transportation (Medical): No   Lack of Transportation (Non-Medical): No  Physical Activity:  Insufficiently Active   Days of Exercise per Week: 5 days   Minutes of Exercise per Session: 10 min  Stress: No Stress Concern Present   Feeling of Stress : Not at all  Social Connections: Socially Integrated   Frequency of Communication with Friends and Family: Twice a week   Frequency of Social Gatherings with Friends and Family: Twice a week   Attends Religious Services: More than 4 times per year   Active Member of [employer] or Organizations: Yes   Attends Music therapist: More than 4 times per year   Marital Status: Married    Tobacco Counseling Counseling given: Not Answered   Clinical Intake:  Pre-visit preparation completed: Yes  Pain : No/denies pain     Nutritional Risks: None Diabetes: No  How often do you need to have someone help you when you read instructions, pamphlets, or other written materials from your doctor or pharmacy?: 1 -  Never What is the last grade level you completed in school?: college  Diabetic?no  Interpreter Needed?: No  Information entered by :: [place] of Daily Living In your present state of health, do you have any difficulty performing the following activities: 08/12/2021 09/03/2020  Hearing? N N  Vision? N N  Difficulty concentrating or making decisions? N N  Walking or climbing stairs? N N  Dressing or bathing? N N  Doing errands, shopping? N N  Preparing Food and eating ? N N  Using the Toilet? N N  In the past six months, have you accidently leaked urine? N N  Do you have problems with loss of bowel control? N Y  Comment - has had issues with controlling bowel in the past 6 months but issue has subsided  Managing your Medications? N N  Managing your Finances? N N  Housekeeping or managing your Housekeeping? N N  Some recent data might be hidden    Patient Care Team: [name], MD as PCP - General (Family Medicine)  Indicate any recent Medical Services you may have received from other  than [facility] providers in the past year (date may be approximate).     Assessment:   This is a routine wellness examination for [place].  Hearing/Vision screen Vision Screening - Comments:: Annual eye exams   Dietary issues and exercise activities discussed: Current Exercise Habits: Home exercise routine, Type of exercise: walking, Time (Minutes): 10, Frequency (Times/Week): 5, Weekly Exercise (Minutes/Week): 50, Intensity: Mild, Exercise limited by: orthopedic condition(s)   Goals Addressed             This Visit's Progress    Exercise 3x per week (30 min per time)   On track      Depression Screen PHQ 2/9 Scores 08/12/2021 08/12/2021 09/03/2020 10/05/2018 10/28/2017 09/17/2017 04/07/2016  PHQ - 2 Score 0 0 0 0 0 0 0  PHQ- 9 Score - - 0 - - - -    Fall Risk Fall Risk  08/12/2021 09/03/2020 10/05/2018 10/28/2017 09/17/2017  Falls in the past year? 0 0 0 No No  Number falls in past yr: 0 0 - - -  Injury with Fall? 0 0 - - -  Risk for fall due to : - No Fall Risks - - -  Follow up Falls evaluation completed Falls evaluation completed;Falls prevention discussed - - -    FALL RISK PREVENTION PERTAINING TO THE HOME:  Any stairs in or around the home? Yes  If so, are there any without handrails? No  Home free of loose throw rugs in walkways, pet beds, electrical cords, etc? Yes  Adequate lighting in your home to reduce risk of falls? Yes   ASSISTIVE DEVICES UTILIZED TO PREVENT FALLS:  Life alert? No  Use of a cane, [place] or w/c? No  Grab bars in the bathroom? No  Shower chair or bench in shower? Yes  Elevated toilet seat or a handicapped toilet? Yes    Cognitive Function: Normal cognitive status assessed by direct observation by this Nurse Health Advisor. No abnormalities found.   MMSE - Mini Mental State Exam 10/05/2018 09/17/2017  Not completed: (No Data) (No Data)        Immunizations Immunization History  Administered Date(s) Administered   Fluad Quad(high Dose  65+) 07/22/2019, 09/19/2020   Influenza Split 08/02/2010, 08/15/2012   Influenza, High Dose Seasonal PF 08/20/2012, 09/19/2014, 07/01/2016, 08/17/2017, 09/03/2018   Influenza-Unspecified 08/31/2017   PFIZER(Purple Top)SARS-COV-2 Vaccination 11/28/2019,  12/17/2019, 08/31/2020   Pneumococcal Conjugate-13 05/01/2016   Pneumococcal Polysaccharide-23 11/02/2004, 09/17/2017   Tdap 11/03/2007, 11/15/2008   Zoster, Live 11/02/2005    TDAP status: Due, Education has been provided regarding the importance of this vaccine. Advised may receive this vaccine at local pharmacy or Health Dept. Aware to provide a copy of the vaccination record if obtained from local pharmacy or Health Dept. Verbalized acceptance and understanding.  Flu Vaccine status: Up to date  Pneumococcal vaccine status: Up to date  Covid-19 vaccine status: Completed vaccines  Qualifies for Shingles Vaccine? Yes   Zostavax completed No   Shingrix Completed?: No.    Education has been provided regarding the importance of this vaccine. Patient has been advised to call insurance company to determine out of pocket expense if they have not yet received this vaccine. Advised may also receive vaccine at local pharmacy or Health Dept. Verbalized acceptance and understanding.  Screening Tests Health Maintenance  Topic Date Due   Zoster Vaccines- Shingrix (1 of 2) Never done   TETANUS/TDAP  11/15/2018   MAMMOGRAM  02/10/2019   COVID-19 Vaccine (4 - Booster for Pfizer series) 11/23/2020   Fecal DNA (Cologuard)  01/17/2021   INFLUENZA VACCINE  06/02/2021   DEXA SCAN  Completed   Hepatitis C Screening  Completed   HPV VACCINES  Aged Out    Health Maintenance  Health Maintenance Due  Topic Date Due   Zoster Vaccines- Shingrix (1 of 2) Never done   TETANUS/TDAP  11/15/2018   MAMMOGRAM  02/10/2019   COVID-19 Vaccine (4 - Booster for Pfizer series) 11/23/2020   Fecal DNA (Cologuard)  01/17/2021   INFLUENZA VACCINE  06/02/2021     Colorectal cancer screening: Type of screening: Colonoscopy. Completed 08/06/2021. Repeat every 10 years  Mammogram status: Completed 09/01/2021. Repeat every year  Bone Density status: Completed 12/22/2017. Results reflect: Bone density results: OSTEOPENIA. Repeat every 5 years.  Lung Cancer Screening: (Low Dose CT Chest recommended if Age 53-80 years, 30 pack-year currently smoking OR have quit w/in 15years.) does not qualify.   Lung Cancer Screening Referral: n/a  Additional Screening:  Hepatitis C Screening: does not qualify; Completed 10/28/2017  Vision Screening: Recommended annual ophthalmology exams for early detection of glaucoma and other disorders of the eye. Is the patient up to date with their annual eye exam?  No  Who is the provider or what is the name of the office in which the patient attends annual eye exams? Dr. Nicki Reaper  If pt is not established with a provider, would they like to be referred to a provider to establish care? No .   Dental Screening: Recommended annual dental exams for proper oral hygiene  Community Resource Referral / Chronic Care Management: CRR required this visit?  No   CCM required this visit?  No      Plan:     I have personally reviewed and noted the following in the patient's chart:   Medical and social history Use of alcohol, tobacco or illicit drugs  Current medications and supplements including opioid prescriptions.  Functional ability and status Nutritional status Physical activity Advanced directives List of other physicians Hospitalizations, surgeries, and ER visits in previous 12 months Vitals Screenings to include cognitive, depression, and falls Referrals and appointments  In addition, I have reviewed and discussed with patient certain preventive protocols, quality metrics, and best practice recommendations. A written personalized care plan for preventive services as well as general preventive health recommendations  were provided to patient.  Randel Pigg, LPN   [id number]   Nurse Notes: none

## 2021-08-12 NOTE — Patient Instructions (Signed)
Tonya Hamilton , Thank you for taking time to come for your Medicare Wellness Visit. I appreciate your ongoing commitment to your health goals. Please review the following plan we discussed and let me know if I can assist you in the future.   Screening recommendations/referrals: Colonoscopy: 08/06/2021   due in 10 year  Mammogram: scheduled 09/01/2021 Bone Density: 12/22/2017 Recommended yearly ophthalmology/optometry visit for glaucoma screening and checkup Recommended yearly dental visit for hygiene and checkup  Vaccinations: Influenza vaccine: due in fall 2022  Pneumococcal vaccine: completed series  Tdap vaccine: due with injury  Shingles vaccine: declined     Advanced directives: will provide copies   Conditions/risks identified: none   Next appointment: none    Preventive Care 75 Years and Older, Female Preventive care refers to lifestyle choices and visits with your health care provider that can promote health and wellness. What does preventive care include? A yearly physical exam. This is also called an annual well check. Dental exams once or twice a year. Routine eye exams. Ask your health care provider how often you should have your eyes checked. Personal lifestyle choices, including: Daily care of your teeth and gums. Regular physical activity. Eating a healthy diet. Avoiding tobacco and drug use. Limiting alcohol use. Practicing safe sex. Taking low-dose aspirin every day. Taking vitamin and mineral supplements as recommended by your health care provider. What happens during an annual well check? The services and screenings done by your health care provider during your annual well check will depend on your age, overall health, lifestyle risk factors, and family history of disease. Counseling  Your health care provider may ask you questions about your: Alcohol use. Tobacco use. Drug use. Emotional well-being. Home and relationship well-being. Sexual  activity. Eating habits. History of falls. Memory and ability to understand (cognition). Work and work Statistician. Reproductive health. Screening  You may have the following tests or measurements: Height, weight, and BMI. Blood pressure. Lipid and cholesterol levels. These may be checked every 5 years, or more frequently if you are over 65 years old. Skin check. Lung cancer screening. You may have this screening every year starting at age 35 if you have a 30-pack-year history of smoking and currently smoke or have quit within the past 15 years. Fecal occult blood test (FOBT) of the stool. You may have this test every year starting at age 40. Flexible sigmoidoscopy or colonoscopy. You may have a sigmoidoscopy every 5 years or a colonoscopy every 10 years starting at age 14. Hepatitis C blood test. Hepatitis B blood test. Sexually transmitted disease (STD) testing. Diabetes screening. This is done by checking your blood sugar (glucose) after you have not eaten for a while (fasting). You may have this done every 1-3 years. Bone density scan. This is done to screen for osteoporosis. You may have this done starting at age 72. Mammogram. This may be done every 1-2 years. Talk to your health care provider about how often you should have regular mammograms. Talk with your health care provider about your test results, treatment options, and if necessary, the need for more tests. Vaccines  Your health care provider may recommend certain vaccines, such as: Influenza vaccine. This is recommended every year. Tetanus, diphtheria, and acellular pertussis (Tdap, Td) vaccine. You may need a Td booster every 10 years. Zoster vaccine. You may need this after age 28. Pneumococcal 13-valent conjugate (PCV13) vaccine. One dose is recommended after age 100. Pneumococcal polysaccharide (PPSV23) vaccine. One dose is recommended after age 51.  Talk to your health care provider about which screenings and vaccines  you need and how often you need them. This information is not intended to replace advice given to you by your health care provider. Make sure you discuss any questions you have with your health care provider. Document Released: 11/15/2015 Document Revised: 07/08/2016 Document Reviewed: 08/20/2015 Elsevier Interactive Patient Education  2017 [place] Prevention in the Home Falls can cause injuries. They can happen to people of all ages. There are many things you can do to make your home safe and to help prevent falls. What can I do on the outside of my home? Regularly fix the edges of walkways and driveways and fix any cracks. Remove anything that might make you trip as you walk through a door, such as a raised step or threshold. Trim any bushes or trees on the path to your home. Use bright outdoor lighting. Clear any walking paths of anything that might make someone trip, such as rocks or tools. Regularly check to see if handrails are loose or broken. Make sure that both sides of any steps have handrails. Any raised decks and porches should have guardrails on the edges. Have any leaves, snow, or ice cleared regularly. Use sand or salt on walking paths during winter. Clean up any spills in your garage right away. This includes oil or grease spills. What can I do in the bathroom? Use night lights. Install grab bars by the toilet and in the tub and shower. Do not use towel bars as grab bars. Use non-skid mats or decals in the tub or shower. If you need to sit down in the shower, use a plastic, non-slip stool. Keep the floor dry. Clean up any water that spills on the floor as soon as it happens. Remove soap buildup in the tub or shower regularly. Attach bath mats securely with double-sided non-slip rug tape. Do not have throw rugs and other things on the floor that can make you trip. What can I do in the bedroom? Use night lights. Make sure that you have a light by your bed that  is easy to reach. Do not use any sheets or blankets that are too big for your bed. They should not hang down onto the floor. Have a firm chair that has side arms. You can use this for support while you get dressed. Do not have throw rugs and other things on the floor that can make you trip. What can I do in the kitchen? Clean up any spills right away. Avoid walking on wet floors. Keep items that you use a lot in easy-to-reach places. If you need to reach something above you, use a strong step stool that has a grab bar. Keep electrical cords out of the way. Do not use floor polish or wax that makes floors slippery. If you must use wax, use non-skid floor wax. Do not have throw rugs and other things on the floor that can make you trip. What can I do with my stairs? Do not leave any items on the stairs. Make sure that there are handrails on both sides of the stairs and use them. Fix handrails that are broken or loose. Make sure that handrails are as long as the stairways. Check any carpeting to make sure that it is firmly attached to the stairs. Fix any carpet that is loose or worn. Avoid having throw rugs at the top or bottom of the stairs. If you do have throw  rugs, attach them to the floor with carpet tape. Make sure that you have a light switch at the top of the stairs and the bottom of the stairs. If you do not have them, ask someone to add them for you. What else can I do to help prevent falls? Wear shoes that: Do not have high heels. Have rubber bottoms. Are comfortable and fit you well. Are closed at the toe. Do not wear sandals. If you use a stepladder: Make sure that it is fully opened. Do not climb a closed stepladder. Make sure that both sides of the stepladder are locked into place. Ask someone to hold it for you, if possible. Clearly mark and make sure that you can see: Any grab bars or handrails. First and last steps. Where the edge of each step is. Use tools that help you  move around (mobility aids) if they are needed. These include: Canes. Walkers. Scooters. Crutches. Turn on the lights when you go into a dark area. Replace any light bulbs as soon as they burn out. Set up your furniture so you have a clear path. Avoid moving your furniture around. If any of your floors are uneven, fix them. If there are any pets around you, be aware of where they are. Review your medicines with your doctor. Some medicines can make you feel dizzy. This can increase your chance of falling. Ask your doctor what other things that you can do to help prevent falls. This information is not intended to replace advice given to you by your health care provider. Make sure you discuss any questions you have with your health care provider. Document Released: 08/15/2009 Document Revised: 03/26/2016 Document Reviewed: 11/23/2014 Elsevier Interactive Patient Education  2017 [employer].

## 2021-09-01 ENCOUNTER — Other Ambulatory Visit: Payer: Self-pay

## 2021-09-01 ENCOUNTER — Ambulatory Visit
Admission: RE | Admit: 2021-09-01 | Discharge: 2021-09-01 | Disposition: A | Discharge status: Home / Self Care | Payer: Medicare HMO | Source: Ambulatory Visit | Attending: Family Medicine | Admitting: Family Medicine

## 2021-09-01 DIAGNOSIS — Z1231 Encounter for screening mammogram for malignant neoplasm of breast: Secondary | ICD-10-CM | POA: Diagnosis not present

## 2021-10-04 ENCOUNTER — Other Ambulatory Visit: Payer: Self-pay | Admitting: Family Medicine

## 2021-10-07 DIAGNOSIS — H52223 Regular astigmatism, bilateral: Secondary | ICD-10-CM | POA: Diagnosis not present

## 2021-10-31 ENCOUNTER — Other Ambulatory Visit: Payer: Self-pay | Admitting: Family Medicine

## 2021-11-04 ENCOUNTER — Other Ambulatory Visit: Payer: Self-pay | Admitting: Family Medicine

## 2021-11-19 ENCOUNTER — Other Ambulatory Visit: Payer: Self-pay | Admitting: Family Medicine

## 2021-12-04 ENCOUNTER — Other Ambulatory Visit: Payer: Self-pay | Admitting: Family Medicine

## 2021-12-10 ENCOUNTER — Other Ambulatory Visit: Payer: Self-pay | Admitting: Family Medicine

## 2021-12-23 ENCOUNTER — Encounter: Payer: Self-pay | Admitting: Family Medicine

## 2021-12-23 ENCOUNTER — Ambulatory Visit (INDEPENDENT_AMBULATORY_CARE_PROVIDER_SITE_OTHER): Payer: Medicare HMO | Admitting: Family Medicine

## 2021-12-23 VITALS — BP 100/60 | HR 95 | Temp 98.6°F | Ht 64.0 in | Wt 132.0 lb

## 2021-12-23 DIAGNOSIS — R21 Rash and other nonspecific skin eruption: Secondary | ICD-10-CM

## 2021-12-23 MED ORDER — CICLOPIROX OLAMINE 0.77 % EX CREA: TOPICAL_CREAM | Freq: Two times a day (BID) | CUTANEOUS | 1 refills | Status: DC

## 2021-12-23 NOTE — Progress Notes (Signed)
Established Patient Office Visit  Subjective:  Patient ID: Tonya Hamilton, female    DOB: 29-Apr-1946  Age: 76 y.o. MRN: [id number]  CC:  Chief Complaint  Patient presents with   Rash    Patient complains of flaky skin, bleeding cracking of right palm and bump noted palmar aspect of the left hand x1 month, no known injury    HPI Tonya Hamilton presents for right hand rash.  Noticed several weeks ago.  Some associated pruritus.  Seems to be spreading somewhat.  Rashes and patches.  She applied some hydrogen peroxide which did not help.  She had some leftover Loprox but only enough for treating for couple of days.  Denies any left hand rash.  She is right-hand dominant.  Does not have her hands in any real chemicals.  Past Medical History:  Diagnosis Date   Arthritis    Colon polyps    Diverticulitis    Hyperlipidemia    Hypertension    Hypothyroidism    PONV (postoperative nausea and vomiting)    Thyroid disease    UTI (urinary tract infection)     Past Surgical History:  Procedure Laterality Date   ABDOMINAL HYSTERECTOMY  1993   TAH, endometriosis   CHOLECYSTECTOMY  2002   TOTAL HIP ARTHROPLASTY Right 05/31/2013   Procedure: RIGHT TOTAL HIP ARTHROPLASTY ANTERIOR APPROACH;  Surgeon: Gearlean Alf, MD;  Location: [facility];  Service: Orthopedics;  Laterality: Right;    Family History  Problem Relation Age of Onset   Arthritis Mother    Hypertension Mother    Heart disease Mother    Hypertension Father    Stroke Father    Heart disease Father    Heart Problems Maternal Grandfather    Breast cancer Neg Hx     Social History   Socioeconomic History   Marital status: Married    Spouse name: Juanda Crumble    Number of children: 2   Years of education: 13   Highest education level: Not on file  Occupational History   Not on file  Tobacco Use   Smoking status: Never   Smokeless tobacco: Never  Vaping Use   Vaping Use: Never used  Substance and Sexual Activity    Alcohol use: No   Drug use: No   Sexual activity: Not Currently  Other Topics Concern   Not on file  Social History Narrative   2 cups coffee per day   Soda daily (1/day)   Lives with husband   Right handed   Social Determinants of Health   Financial Resource Strain: Low Risk    Difficulty of Paying Living Expenses: Not hard at all  Food Insecurity: No Food Insecurity   Worried About Charity fundraiser in the Last Year: Never true   Goodnight in the Last Year: Never true  Transportation Needs: No Transportation Needs   Lack of Transportation (Medical): No   Lack of Transportation (Non-Medical): No  Physical Activity: Insufficiently Active   Days of Exercise per Week: 5 days   Minutes of Exercise per Session: 10 min  Stress: No Stress Concern Present   Feeling of Stress : Not at all  Social Connections: Socially Integrated   Frequency of Communication with Friends and Family: Twice a week   Frequency of Social Gatherings with Friends and Family: Twice a week   Attends Religious Services: More than 4 times per year   Active Member of [employer] or Organizations: Yes  Attends Music therapist: More than 4 times per year   Marital Status: Married  Human resources officer Violence: Not At Risk   Fear of Current or Ex-Partner: No   Emotionally Abused: No   Physically Abused: No   Sexually Abused: No    Outpatient Medications Prior to Visit  Medication Sig Dispense Refill   amLODipine (NORVASC) 5 MG tablet TAKE 1 TABLET BY MOUTH EVERY DAY 30 tablet 0   aspirin-acetaminophen-caffeine (EXCEDRIN MIGRAINE) 250-250-65 MG tablet Take 2 tablets by mouth every 6 (six) hours as needed for headache.     bismuth subsalicylate (PEPTO BISMOL) 262 MG/15ML suspension Take 30 mLs by mouth every 6 (six) hours as needed for indigestion or diarrhea or loose stools.     diphenhydramine-acetaminophen (TYLENOL PM) 25-500 MG TABS tablet Take 2 tablets by mouth at bedtime as needed  (sleep/pain).     fluticasone (FLONASE) 50 MCG/ACT nasal spray Place 1-2 sprays into both nostrils daily as needed for allergies or rhinitis.     ibuprofen (ADVIL) 200 MG tablet Take 400 mg by mouth every 6 (six) hours as needed for headache or mild pain.     levothyroxine (SYNTHROID) 25 MCG tablet TAKE 1 TABLET BY MOUTH EVERY DAY 30 tablet 0   ondansetron (ZOFRAN ODT) 4 MG disintegrating tablet Take 1 tablet (4 mg total) by mouth every 8 (eight) hours as needed for nausea or vomiting. 10 tablet 0   ondansetron (ZOFRAN) 4 MG tablet TAKE 1 TABLET BY MOUTH EVERY 8 HOURS AS NEEDED FOR NAUSEA AND VOMITING 20 tablet 0   pantoprazole (PROTONIX) 40 MG tablet Take 40 mg by mouth daily.     trimethoprim (TRIMPEX) 100 MG tablet TAKE 1 TABLET BY MOUTH EVERY DAY 30 tablet 0   Bacillus Coagulans-Inulin (PROBIOTIC) 1-250 BILLION-MG CAPS Take 1 capsule by mouth daily. 30 capsule 0   cephALEXin (KEFLEX) 500 MG capsule Take 1 capsule (500 mg total) by mouth 3 (three) times daily. 15 capsule 0   ciclopirox (LOPROX) 0.77 % cream Apply topically 2 (two) times daily. 15 g 1   imipramine (TOFRANIL) 10 MG tablet TAKE 1-2 TABLETS BY MOUTH AT BEDTIME 180 tablet 2   meloxicam (MOBIC) 7.5 MG tablet Take 1 tablet (7.5 mg total) by mouth daily. 30 tablet 1   No facility-administered medications prior to visit.    Allergies  Allergen Reactions   Botox [Onabotulinumtoxina]     Was given too much botox and at night, felt like her throat was closing up.    Diclofenac    Lisinopril Swelling   Simvastatin    Statins     Body aches all over   Sulfa Antibiotics Itching    ROS Review of Systems  Constitutional:  Negative for chills and fever.  Skin:  Positive for rash.     Objective:    Physical Exam Vitals reviewed.  Constitutional:      Appearance: Normal appearance.  Cardiovascular:     Rate and Rhythm: Normal rate and regular rhythm.  Skin:    Findings: Rash present.     Comments: Right hand reveals  scattered scaly rash with 1 patch noted the mid lower palmar region.  She has another patch near the base of the thumb and some scaling near the webspace between the index and middle finger.  No pustules.  No vesicles.  Neurological:     Mental Status: She is alert.    BP 100/60 (BP Location: Left Arm, Patient Position: Sitting, Cuff Size: Normal)  Pulse 95    Temp 98.6 F (37 C) (Oral)    Ht 5\' 4"  (1.626 m)    Wt 132 lb (59.9 kg)    SpO2 96%    BMI 22.66 kg/m  Wt Readings from Last 3 Encounters:  12/23/21 132 lb (59.9 kg)  04/09/21 127 lb 9.6 oz (57.9 kg)  02/21/21 126 lb 4.8 oz (57.3 kg)     There are no preventive care reminders to display for this patient.  There are no preventive care reminders to display for this patient.  Lab Results  Component Value Date   TSH 2.12 10/13/2019   Lab Results  Component Value Date   WBC 8.5 07/18/2020   HGB 12.8 07/18/2020   HCT 38.6 07/18/2020   MCV 88.9 07/18/2020   PLT 384 07/18/2020   Lab Results  Component Value Date   NA 139 07/18/2020   K 4.1 07/18/2020   CO2 23 07/18/2020   GLUCOSE 112 (H) 07/18/2020   BUN 7 07/18/2020   CREATININE 0.65 07/18/2020   BILITOT 0.4 07/18/2020   ALKPHOS 62 06/10/2020   AST 23 07/18/2020   ALT 18 07/18/2020   PROT 6.7 07/18/2020   ALBUMIN 4.1 06/10/2020   CALCIUM 9.1 07/18/2020   ANIONGAP 12 06/10/2020   GFR 77.19 10/28/2017   Lab Results  Component Value Date   CHOL 190 10/28/2017   Lab Results  Component Value Date   HDL 39.00 (L) 10/28/2017   Lab Results  Component Value Date   LDLCALC 131 (H) 10/28/2017   Lab Results  Component Value Date   TRIG 101.0 10/28/2017   Lab Results  Component Value Date   CHOLHDL 5 10/28/2017   No results found for: HGBA1C    Assessment & Plan:   Problem List Items Addressed This Visit   None Visit Diagnoses     Rash of hand    -  Primary   Relevant Orders   Culture, Fungus with Smear     Scaly rash right hand.  Rule out  fungal.  Scraping was taken for KOH smear and fungal culture.  This was sent out.  Refill Loprox cream to use twice daily pending culture results.  If culture negative and not improving with Loprox consider steroid cream  Meds ordered this encounter  Medications   ciclopirox (LOPROX) 0.77 % cream    Sig: Apply topically 2 (two) times daily.    Dispense:  15 g    Refill:  1    Follow-up: No follow-ups on file.    Carolann Littler, MD

## 2021-12-23 NOTE — Patient Instructions (Signed)
We will call with culture results.  Avoid hydrogen peroxide  Try to keep hands out of water as much as possible

## 2022-01-15 ENCOUNTER — Other Ambulatory Visit: Payer: Self-pay | Admitting: Family Medicine

## 2022-01-20 ENCOUNTER — Ambulatory Visit (INDEPENDENT_AMBULATORY_CARE_PROVIDER_SITE_OTHER): Payer: Medicare HMO | Admitting: Family Medicine

## 2022-01-20 ENCOUNTER — Encounter: Payer: Self-pay | Admitting: Family Medicine

## 2022-01-20 VITALS — BP 110/80 | HR 88 | Temp 97.8°F | Wt 131.9 lb

## 2022-01-20 DIAGNOSIS — M549 Dorsalgia, unspecified: Secondary | ICD-10-CM | POA: Diagnosis not present

## 2022-01-20 DIAGNOSIS — R300 Dysuria: Secondary | ICD-10-CM | POA: Diagnosis not present

## 2022-01-20 DIAGNOSIS — R21 Rash and other nonspecific skin eruption: Secondary | ICD-10-CM | POA: Diagnosis not present

## 2022-01-20 LAB — POCT URINALYSIS DIPSTICK
Glucose, UA: NEGATIVE
Ketones, UA: NEGATIVE
Protein, UA: POSITIVE — AB
Spec Grav, UA: 1.025 (ref 1.010–1.025)
Urobilinogen, UA: 2 E.U./dL — AB
pH, UA: 5 (ref 5.0–8.0)

## 2022-01-20 MED ORDER — BETAMETHASONE DIPROPIONATE 0.05 % EX CREA: TOPICAL_CREAM | CUTANEOUS | 1 refills | Status: DC

## 2022-01-20 MED ORDER — CEPHALEXIN 500 MG PO CAPS: 500.0000 mg | ORAL_CAPSULE | Freq: Three times a day (TID) | ORAL | 0 refills | Status: DC

## 2022-01-20 NOTE — Progress Notes (Signed)
? ?Established Patient Office Visit ? ?Subjective:  ?Patient ID: Tonya Hamilton, female    DOB: April 11, 1946  Age: 76 y.o. MRN: [id number] ? ?CC:  ?Chief Complaint  ?Patient presents with  ? Urinary Tract Infection  ?  Started yesterday morning, burning,  freq, low back pain. Has tried AZO because of the pain.  ? ? ?HPI ?Tonya Hamilton presents for the following items: ? ?She relates urinary symptoms over the past several days of burning and frequency.  She has had frequent UTIs in the past.  Is currently on prophylaxis with trimethoprim 100 mg daily.  Last UTI was last June with Klebsiella.  Responded to Keflex.  No fever.  No nausea or vomiting.  No flank pain.  No gross hematuria.  She was tried on topical estrogen previously but apparently had severe burning symptoms with that and did not tolerate. ? ?Right hand rash.  We had actually scraped this for fungal culture but lab was unable to receive the specimen.  She has been on Loprox cream for the past few weeks without improvement.  We had discussed trial of steroid cream if not responding to antifungal.  No evidence for spread ? ?Third issue is she states she has history of GERD.  She was started over 2 years ago on Protonix 40 mg daily by another physician in another practice.  She states recently she has had episodes about 2 to 3 hours after eating where she feels some poorly localized upper thoracic pain.  No dysphagia.  No appetite or weight changes.  No pain with swallowing.  Usually takes Pepto-Bismol and aspirin and pain resolves.  No chest pain.  No dyspnea.  No exertional symptoms.  She has had previous cholecystectomy.  Denies any right upper quadrant pains. ? ?She and her husband will be moving to [place] probably by the end of the summer.  Their son is Clinical biochemist of a retirement community there.  They will be closer to grandchildren as well. ? ?Wt Readings from Last 3 Encounters:  ?01/20/22 131 lb 14.4 oz (59.8 kg)  ?12/23/21 132 lb (59.9 kg)   ?04/09/21 127 lb 9.6 oz (57.9 kg)  ? ? ? ?Past Medical History:  ?Diagnosis Date  ? Arthritis   ? Colon polyps   ? Diverticulitis   ? Hyperlipidemia   ? Hypertension   ? Hypothyroidism   ? PONV (postoperative nausea and vomiting)   ? Thyroid disease   ? UTI (urinary tract infection)   ? ? ?Past Surgical History:  ?Procedure Laterality Date  ? ABDOMINAL HYSTERECTOMY  1993  ? TAH, endometriosis  ? CHOLECYSTECTOMY  2002  ? TOTAL HIP ARTHROPLASTY Right 05/31/2013  ? Procedure: RIGHT TOTAL HIP ARTHROPLASTY ANTERIOR APPROACH;  Surgeon: Gearlean Alf, MD;  Location: [facility];  Service: Orthopedics;  Laterality: Right;  ? ? ?Family History  ?Problem Relation Age of Onset  ? Arthritis Mother   ? Hypertension Mother   ? Heart disease Mother   ? Hypertension Father   ? Stroke Father   ? Heart disease Father   ? Heart Problems Maternal Grandfather   ? Breast cancer Neg Hx   ? ? ?Social History  ? ?Socioeconomic History  ? Marital status: Married  ?  Spouse name: Juanda Crumble   ? Number of children: 2  ? Years of education: 78  ? Highest education level: Not on file  ?Occupational History  ? Not on file  ?Tobacco Use  ? Smoking status: Never  ?  Smokeless tobacco: Never  ?Vaping Use  ? Vaping Use: Never used  ?Substance and Sexual Activity  ? Alcohol use: No  ? Drug use: No  ? Sexual activity: Not Currently  ?Other Topics Concern  ? Not on file  ?Social History Narrative  ? 2 cups coffee per day  ? Soda daily (1/day)  ? Lives with husband  ? Right handed  ? ?Social Determinants of Health  ? ?Financial Resource Strain: Low Risk   ? Difficulty of Paying Living Expenses: Not hard at all  ?Food Insecurity: No Food Insecurity  ? Worried About Charity fundraiser in the Last Year: Never true  ? Ran Out of Food in the Last Year: Never true  ?Transportation Needs: No Transportation Needs  ? Lack of Transportation (Medical): No  ? Lack of Transportation (Non-Medical): No  ?Physical Activity: Insufficiently Active  ? Days of Exercise per Week:  5 days  ? Minutes of Exercise per Session: 10 min  ?Stress: No Stress Concern Present  ? Feeling of Stress : Not at all  ?Social Connections: Socially Integrated  ? Frequency of Communication with Friends and Family: Twice a week  ? Frequency of Social Gatherings with Friends and Family: Twice a week  ? Attends Religious Services: More than 4 times per year  ? Active Member of Clubs or Organizations: Yes  ? Attends Archivist Meetings: More than 4 times per year  ? Marital Status: Married  ?Intimate Partner Violence: Not At Risk  ? Fear of Current or Ex-Partner: No  ? Emotionally Abused: No  ? Physically Abused: No  ? Sexually Abused: No  ? ? ?Outpatient Medications Prior to Visit  ?Medication Sig Dispense Refill  ? amLODipine (NORVASC) 5 MG tablet TAKE 1 TABLET BY MOUTH EVERY DAY 30 tablet 2  ? aspirin-acetaminophen-caffeine (EXCEDRIN MIGRAINE) 250-250-65 MG tablet Take 2 tablets by mouth every 6 (six) hours as needed for headache.    ? bismuth subsalicylate (PEPTO BISMOL) 262 MG/15ML suspension Take 30 mLs by mouth every 6 (six) hours as needed for indigestion or diarrhea or loose stools.    ? ciclopirox (LOPROX) 0.77 % cream Apply topically 2 (two) times daily. 15 g 1  ? diphenhydramine-acetaminophen (TYLENOL PM) 25-500 MG TABS tablet Take 2 tablets by mouth at bedtime as needed (sleep/pain).    ? fluticasone (FLONASE) 50 MCG/ACT nasal spray Place 1-2 sprays into both nostrils daily as needed for allergies or rhinitis.    ? ibuprofen (ADVIL) 200 MG tablet Take 400 mg by mouth every 6 (six) hours as needed for headache or mild pain.    ? levothyroxine (SYNTHROID) 25 MCG tablet TAKE 1 TABLET BY MOUTH EVERY DAY 30 tablet 0  ? ondansetron (ZOFRAN ODT) 4 MG disintegrating tablet Take 1 tablet (4 mg total) by mouth every 8 (eight) hours as needed for nausea or vomiting. 10 tablet 0  ? ondansetron (ZOFRAN) 4 MG tablet TAKE 1 TABLET BY MOUTH EVERY 8 HOURS AS NEEDED FOR NAUSEA AND VOMITING 20 tablet 0  ?  pantoprazole (PROTONIX) 40 MG tablet Take 40 mg by mouth daily.    ? trimethoprim (TRIMPEX) 100 MG tablet TAKE 1 TABLET BY MOUTH EVERY DAY 30 tablet 0  ? ?No facility-administered medications prior to visit.  ? ? ?Allergies  ?Allergen Reactions  ? Botox [Onabotulinumtoxina]   ?  Was given too much botox and at night, felt like her throat was closing up.   ? Diclofenac   ? Lisinopril Swelling  ?  Simvastatin   ? Statins   ?  Body aches all over  ? Sulfa Antibiotics Itching  ? ? ?ROS ?Review of Systems  ?Constitutional:  Negative for chills and fever.  ?HENT:  Negative for sore throat and trouble swallowing.   ?Respiratory:  Negative for cough and shortness of breath.   ?Genitourinary:  Positive for dysuria and frequency. Negative for flank pain and hematuria.  ?Musculoskeletal:  Positive for back pain.  ?Skin:  Positive for rash.  ? ?  ?Objective:  ?  ?Physical Exam ?Vitals reviewed.  ?Constitutional:   ?   Appearance: Normal appearance.  ?Cardiovascular:  ?   Rate and Rhythm: Normal rate and regular rhythm.  ?Pulmonary:  ?   Effort: Pulmonary effort is normal.  ?   Breath sounds: Normal breath sounds.  ?Musculoskeletal:  ?   Comments: No spinal tenderness thoracic spine region.  ?Skin: ?   Comments: Skin rash palmar surface right hand near the base somewhat linear distribution.  No pustules.  ?Neurological:  ?   Mental Status: She is alert.  ? ? ?BP 110/80 (BP Location: Left Arm, Patient Position: Sitting, Cuff Size: Normal)   Pulse 88   Temp 97.8 ?F (36.6 ?C) (Oral)   Wt 131 lb 14.4 oz (59.8 kg)   SpO2 96%   BMI 22.64 kg/m?  ?Wt Readings from Last 3 Encounters:  ?01/20/22 131 lb 14.4 oz (59.8 kg)  ?12/23/21 132 lb (59.9 kg)  ?04/09/21 127 lb 9.6 oz (57.9 kg)  ? ? ? ?Health Maintenance Due  ?Topic Date Due  ? COVID-19 Vaccine (4 - Booster for Pfizer series) 10/26/2020  ? ? ?There are no preventive care reminders to display for this patient. ? ?Lab Results  ?Component Value Date  ? TSH 2.12 10/13/2019  ? ?Lab  Results  ?Component Value Date  ? WBC 8.5 07/18/2020  ? HGB 12.8 07/18/2020  ? HCT 38.6 07/18/2020  ? MCV 88.9 07/18/2020  ? PLT 384 07/18/2020  ? ?Lab Results  ?Component Value Date  ? NA 139 07/18/2020  ? K 4.1 09/1

## 2022-01-22 LAB — URINE CULTURE
MICRO NUMBER:: 13158826
SPECIMEN QUALITY:: ADEQUATE

## 2022-01-23 LAB — CULT, FUNGUS, SKIN,HAIR,NAIL W/KOH
CULTURE:: NO GROWTH
MICRO NUMBER:: 13042394
SMEAR:: NONE SEEN
SPECIMEN QUALITY:: ADEQUATE

## 2022-01-23 LAB — TIQ-NTM

## 2022-01-27 ENCOUNTER — Other Ambulatory Visit: Payer: Self-pay | Admitting: Family Medicine

## 2022-02-02 ENCOUNTER — Encounter (HOSPITAL_BASED_OUTPATIENT_CLINIC_OR_DEPARTMENT_OTHER): Payer: Self-pay

## 2022-02-02 ENCOUNTER — Emergency Department (HOSPITAL_BASED_OUTPATIENT_CLINIC_OR_DEPARTMENT_OTHER): Payer: Medicare HMO

## 2022-02-02 ENCOUNTER — Inpatient Hospital Stay (HOSPITAL_BASED_OUTPATIENT_CLINIC_OR_DEPARTMENT_OTHER)
Admission: EM | Admit: 2022-02-02 | Discharge: 2022-02-19 | DRG: 216 | Disposition: A | Discharge status: Home / Self Care | Payer: Medicare HMO | Attending: Surgery | Admitting: Surgery

## 2022-02-02 ENCOUNTER — Other Ambulatory Visit: Payer: Self-pay

## 2022-02-02 DIAGNOSIS — J189 Pneumonia, unspecified organism: Secondary | ICD-10-CM | POA: Diagnosis not present

## 2022-02-02 DIAGNOSIS — I110 Hypertensive heart disease with heart failure: Secondary | ICD-10-CM | POA: Diagnosis present

## 2022-02-02 DIAGNOSIS — I214 Non-ST elevation (NSTEMI) myocardial infarction: Secondary | ICD-10-CM | POA: Diagnosis not present

## 2022-02-02 DIAGNOSIS — Z87440 Personal history of urinary (tract) infections: Secondary | ICD-10-CM

## 2022-02-02 DIAGNOSIS — E873 Alkalosis: Secondary | ICD-10-CM | POA: Diagnosis not present

## 2022-02-02 DIAGNOSIS — Z8261 Family history of arthritis: Secondary | ICD-10-CM

## 2022-02-02 DIAGNOSIS — I255 Ischemic cardiomyopathy: Secondary | ICD-10-CM | POA: Diagnosis present

## 2022-02-02 DIAGNOSIS — I272 Pulmonary hypertension, unspecified: Secondary | ICD-10-CM | POA: Diagnosis present

## 2022-02-02 DIAGNOSIS — Z96641 Presence of right artificial hip joint: Secondary | ICD-10-CM | POA: Diagnosis present

## 2022-02-02 DIAGNOSIS — R112 Nausea with vomiting, unspecified: Secondary | ICD-10-CM | POA: Diagnosis not present

## 2022-02-02 DIAGNOSIS — R0602 Shortness of breath: Secondary | ICD-10-CM | POA: Diagnosis not present

## 2022-02-02 DIAGNOSIS — J9811 Atelectasis: Secondary | ICD-10-CM | POA: Diagnosis not present

## 2022-02-02 DIAGNOSIS — Z91048 Other nonmedicinal substance allergy status: Secondary | ICD-10-CM

## 2022-02-02 DIAGNOSIS — I2510 Atherosclerotic heart disease of native coronary artery without angina pectoris: Secondary | ICD-10-CM | POA: Diagnosis present

## 2022-02-02 DIAGNOSIS — E876 Hypokalemia: Secondary | ICD-10-CM | POA: Diagnosis not present

## 2022-02-02 DIAGNOSIS — Z8249 Family history of ischemic heart disease and other diseases of the circulatory system: Secondary | ICD-10-CM

## 2022-02-02 DIAGNOSIS — I340 Nonrheumatic mitral (valve) insufficiency: Secondary | ICD-10-CM | POA: Diagnosis present

## 2022-02-02 DIAGNOSIS — Z882 Allergy status to sulfonamides status: Secondary | ICD-10-CM

## 2022-02-02 DIAGNOSIS — J9690 Respiratory failure, unspecified, unspecified whether with hypoxia or hypercapnia: Secondary | ICD-10-CM | POA: Diagnosis present

## 2022-02-02 DIAGNOSIS — Z79890 Hormone replacement therapy (postmenopausal): Secondary | ICD-10-CM

## 2022-02-02 DIAGNOSIS — G4700 Insomnia, unspecified: Secondary | ICD-10-CM | POA: Diagnosis not present

## 2022-02-02 DIAGNOSIS — I5041 Acute combined systolic (congestive) and diastolic (congestive) heart failure: Secondary | ICD-10-CM | POA: Diagnosis present

## 2022-02-02 DIAGNOSIS — I3139 Other pericardial effusion (noninflammatory): Secondary | ICD-10-CM | POA: Diagnosis not present

## 2022-02-02 DIAGNOSIS — Z9049 Acquired absence of other specified parts of digestive tract: Secondary | ICD-10-CM

## 2022-02-02 DIAGNOSIS — N170 Acute kidney failure with tubular necrosis: Secondary | ICD-10-CM | POA: Diagnosis present

## 2022-02-02 DIAGNOSIS — J9600 Acute respiratory failure, unspecified whether with hypoxia or hypercapnia: Secondary | ICD-10-CM | POA: Diagnosis present

## 2022-02-02 DIAGNOSIS — I4892 Unspecified atrial flutter: Secondary | ICD-10-CM | POA: Diagnosis not present

## 2022-02-02 DIAGNOSIS — J9602 Acute respiratory failure with hypercapnia: Secondary | ICD-10-CM | POA: Diagnosis not present

## 2022-02-02 DIAGNOSIS — K219 Gastro-esophageal reflux disease without esophagitis: Secondary | ICD-10-CM | POA: Diagnosis present

## 2022-02-02 DIAGNOSIS — Z7982 Long term (current) use of aspirin: Secondary | ICD-10-CM

## 2022-02-02 DIAGNOSIS — Z888 Allergy status to other drugs, medicaments and biological substances status: Secondary | ICD-10-CM

## 2022-02-02 DIAGNOSIS — Z79899 Other long term (current) drug therapy: Secondary | ICD-10-CM

## 2022-02-02 DIAGNOSIS — J9601 Acute respiratory failure with hypoxia: Secondary | ICD-10-CM | POA: Diagnosis not present

## 2022-02-02 DIAGNOSIS — I25112 Atherosclerotic heart disease of native coronary artery with refractory angina pectoris: Secondary | ICD-10-CM | POA: Diagnosis not present

## 2022-02-02 DIAGNOSIS — E871 Hypo-osmolality and hyponatremia: Secondary | ICD-10-CM | POA: Diagnosis not present

## 2022-02-02 DIAGNOSIS — D62 Acute posthemorrhagic anemia: Secondary | ICD-10-CM | POA: Diagnosis not present

## 2022-02-02 DIAGNOSIS — D75839 Thrombocytosis, unspecified: Secondary | ICD-10-CM | POA: Diagnosis not present

## 2022-02-02 DIAGNOSIS — Z20822 Contact with and (suspected) exposure to covid-19: Secondary | ICD-10-CM | POA: Diagnosis present

## 2022-02-02 DIAGNOSIS — I440 Atrioventricular block, first degree: Secondary | ICD-10-CM | POA: Diagnosis not present

## 2022-02-02 DIAGNOSIS — E039 Hypothyroidism, unspecified: Secondary | ICD-10-CM | POA: Diagnosis present

## 2022-02-02 DIAGNOSIS — Z823 Family history of stroke: Secondary | ICD-10-CM

## 2022-02-02 DIAGNOSIS — F419 Anxiety disorder, unspecified: Secondary | ICD-10-CM | POA: Diagnosis present

## 2022-02-02 DIAGNOSIS — R451 Restlessness and agitation: Secondary | ICD-10-CM | POA: Diagnosis not present

## 2022-02-02 DIAGNOSIS — R7303 Prediabetes: Secondary | ICD-10-CM | POA: Diagnosis present

## 2022-02-02 DIAGNOSIS — R197 Diarrhea, unspecified: Secondary | ICD-10-CM | POA: Diagnosis not present

## 2022-02-02 DIAGNOSIS — D72829 Elevated white blood cell count, unspecified: Secondary | ICD-10-CM | POA: Diagnosis not present

## 2022-02-02 DIAGNOSIS — Z951 Presence of aortocoronary bypass graft: Secondary | ICD-10-CM

## 2022-02-02 DIAGNOSIS — R570 Cardiogenic shock: Secondary | ICD-10-CM | POA: Diagnosis present

## 2022-02-02 DIAGNOSIS — E785 Hyperlipidemia, unspecified: Secondary | ICD-10-CM | POA: Diagnosis present

## 2022-02-02 DIAGNOSIS — Z90710 Acquired absence of both cervix and uterus: Secondary | ICD-10-CM

## 2022-02-02 HISTORY — DX: Pneumonia, unspecified organism: J18.9

## 2022-02-02 HISTORY — DX: Cardiogenic shock: R57.0

## 2022-02-02 LAB — CBC WITH DIFFERENTIAL/PLATELET
Abs Immature Granulocytes: 0.14 10*3/uL — ABNORMAL HIGH (ref 0.00–0.07)
Basophils Absolute: 0 10*3/uL (ref 0.0–0.1)
Basophils Relative: 0 %
Eosinophils Absolute: 0.1 10*3/uL (ref 0.0–0.5)
Eosinophils Relative: 0 %
HCT: 36.6 % (ref 36.0–46.0)
Hemoglobin: 11.9 g/dL — ABNORMAL LOW (ref 12.0–15.0)
Immature Granulocytes: 1 %
Lymphocytes Relative: 4 %
Lymphs Abs: 1 10*3/uL (ref 0.7–4.0)
MCH: 28.2 pg (ref 26.0–34.0)
MCHC: 32.5 g/dL (ref 30.0–36.0)
MCV: 86.7 fL (ref 80.0–100.0)
Monocytes Absolute: 1.4 10*3/uL — ABNORMAL HIGH (ref 0.1–1.0)
Monocytes Relative: 6 %
Neutro Abs: 21 10*3/uL — ABNORMAL HIGH (ref 1.7–7.7)
Neutrophils Relative %: 89 %
Platelets: 395 10*3/uL (ref 150–400)
RBC: 4.22 MIL/uL (ref 3.87–5.11)
RDW: 14.9 % (ref 11.5–15.5)
WBC: 23.6 10*3/uL — ABNORMAL HIGH (ref 4.0–10.5)
nRBC: 0 % (ref 0.0–0.2)

## 2022-02-02 MED ORDER — ONDANSETRON HCL 4 MG/2ML IJ SOLN
4.0000 mg | Freq: Once | INTRAMUSCULAR | Status: AC
Start: 2022-02-03 — End: 2022-02-03
  Administered 2022-02-03: 4 mg via INTRAVENOUS
  Filled 2022-02-02: qty 2

## 2022-02-02 NOTE — ED Notes (Signed)
ED Provider at bedside. 

## 2022-02-02 NOTE — ED Notes (Signed)
Repeat EKG completed

## 2022-02-02 NOTE — ED Triage Notes (Signed)
Pt husband says that the patient has not been feeling well today, that around noon she became SOB, he says her oxygen level tonight has been around 88%. C/o nausea, denies cp. Has had a cough today. Denies fevers.  ? ?Pt is pale, diaphoretic, saturations around 84% on room air during triage assessment.  ?

## 2022-02-03 ENCOUNTER — Telehealth: Payer: Self-pay | Admitting: Physician Assistant

## 2022-02-03 ENCOUNTER — Encounter (HOSPITAL_COMMUNITY): Payer: Self-pay | Admitting: Pulmonary Disease

## 2022-02-03 ENCOUNTER — Inpatient Hospital Stay (HOSPITAL_COMMUNITY): Payer: Medicare HMO

## 2022-02-03 ENCOUNTER — Encounter (HOSPITAL_COMMUNITY)
Admission: EM | Disposition: A | Discharge status: Home / Self Care | Payer: Self-pay | Source: Home / Self Care | Attending: Surgery

## 2022-02-03 DIAGNOSIS — E039 Hypothyroidism, unspecified: Secondary | ICD-10-CM | POA: Diagnosis not present

## 2022-02-03 DIAGNOSIS — Z20822 Contact with and (suspected) exposure to covid-19: Secondary | ICD-10-CM | POA: Diagnosis not present

## 2022-02-03 DIAGNOSIS — Z4682 Encounter for fitting and adjustment of non-vascular catheter: Secondary | ICD-10-CM | POA: Diagnosis not present

## 2022-02-03 DIAGNOSIS — I2510 Atherosclerotic heart disease of native coronary artery without angina pectoris: Secondary | ICD-10-CM | POA: Diagnosis present

## 2022-02-03 DIAGNOSIS — I519 Heart disease, unspecified: Secondary | ICD-10-CM

## 2022-02-03 DIAGNOSIS — J9690 Respiratory failure, unspecified, unspecified whether with hypoxia or hypercapnia: Secondary | ICD-10-CM | POA: Diagnosis present

## 2022-02-03 DIAGNOSIS — Z452 Encounter for adjustment and management of vascular access device: Secondary | ICD-10-CM | POA: Diagnosis not present

## 2022-02-03 DIAGNOSIS — I25119 Atherosclerotic heart disease of native coronary artery with unspecified angina pectoris: Secondary | ICD-10-CM | POA: Diagnosis not present

## 2022-02-03 DIAGNOSIS — I440 Atrioventricular block, first degree: Secondary | ICD-10-CM | POA: Diagnosis not present

## 2022-02-03 DIAGNOSIS — I25112 Atherosclerotic heart disease of native coronary artery with refractory angina pectoris: Secondary | ICD-10-CM | POA: Diagnosis not present

## 2022-02-03 DIAGNOSIS — J810 Acute pulmonary edema: Secondary | ICD-10-CM | POA: Diagnosis not present

## 2022-02-03 DIAGNOSIS — E876 Hypokalemia: Secondary | ICD-10-CM | POA: Diagnosis not present

## 2022-02-03 DIAGNOSIS — J811 Chronic pulmonary edema: Secondary | ICD-10-CM | POA: Diagnosis not present

## 2022-02-03 DIAGNOSIS — J9600 Acute respiratory failure, unspecified whether with hypoxia or hypercapnia: Secondary | ICD-10-CM | POA: Diagnosis not present

## 2022-02-03 DIAGNOSIS — I255 Ischemic cardiomyopathy: Secondary | ICD-10-CM | POA: Diagnosis present

## 2022-02-03 DIAGNOSIS — E871 Hypo-osmolality and hyponatremia: Secondary | ICD-10-CM | POA: Diagnosis not present

## 2022-02-03 DIAGNOSIS — D62 Acute posthemorrhagic anemia: Secondary | ICD-10-CM | POA: Diagnosis not present

## 2022-02-03 DIAGNOSIS — R112 Nausea with vomiting, unspecified: Secondary | ICD-10-CM | POA: Diagnosis not present

## 2022-02-03 DIAGNOSIS — Z951 Presence of aortocoronary bypass graft: Secondary | ICD-10-CM | POA: Diagnosis not present

## 2022-02-03 DIAGNOSIS — Z01810 Encounter for preprocedural cardiovascular examination: Secondary | ICD-10-CM | POA: Diagnosis not present

## 2022-02-03 DIAGNOSIS — J189 Pneumonia, unspecified organism: Secondary | ICD-10-CM | POA: Diagnosis not present

## 2022-02-03 DIAGNOSIS — I3139 Other pericardial effusion (noninflammatory): Secondary | ICD-10-CM | POA: Diagnosis not present

## 2022-02-03 DIAGNOSIS — K219 Gastro-esophageal reflux disease without esophagitis: Secondary | ICD-10-CM | POA: Diagnosis not present

## 2022-02-03 DIAGNOSIS — J9601 Acute respiratory failure with hypoxia: Secondary | ICD-10-CM

## 2022-02-03 DIAGNOSIS — I5021 Acute systolic (congestive) heart failure: Secondary | ICD-10-CM | POA: Diagnosis not present

## 2022-02-03 DIAGNOSIS — I5041 Acute combined systolic (congestive) and diastolic (congestive) heart failure: Secondary | ICD-10-CM | POA: Diagnosis not present

## 2022-02-03 DIAGNOSIS — G4700 Insomnia, unspecified: Secondary | ICD-10-CM | POA: Diagnosis not present

## 2022-02-03 DIAGNOSIS — R0602 Shortness of breath: Secondary | ICD-10-CM | POA: Diagnosis not present

## 2022-02-03 DIAGNOSIS — R7303 Prediabetes: Secondary | ICD-10-CM | POA: Diagnosis not present

## 2022-02-03 DIAGNOSIS — I700 Atherosclerosis of aorta: Secondary | ICD-10-CM | POA: Diagnosis not present

## 2022-02-03 DIAGNOSIS — I272 Pulmonary hypertension, unspecified: Secondary | ICD-10-CM | POA: Diagnosis not present

## 2022-02-03 DIAGNOSIS — I517 Cardiomegaly: Secondary | ICD-10-CM | POA: Diagnosis not present

## 2022-02-03 DIAGNOSIS — D72829 Elevated white blood cell count, unspecified: Secondary | ICD-10-CM | POA: Diagnosis not present

## 2022-02-03 DIAGNOSIS — I4892 Unspecified atrial flutter: Secondary | ICD-10-CM | POA: Diagnosis not present

## 2022-02-03 DIAGNOSIS — I214 Non-ST elevation (NSTEMI) myocardial infarction: Secondary | ICD-10-CM | POA: Diagnosis not present

## 2022-02-03 DIAGNOSIS — I088 Other rheumatic multiple valve diseases: Secondary | ICD-10-CM | POA: Diagnosis not present

## 2022-02-03 DIAGNOSIS — J9811 Atelectasis: Secondary | ICD-10-CM | POA: Diagnosis not present

## 2022-02-03 DIAGNOSIS — E873 Alkalosis: Secondary | ICD-10-CM | POA: Diagnosis not present

## 2022-02-03 DIAGNOSIS — R570 Cardiogenic shock: Secondary | ICD-10-CM | POA: Diagnosis present

## 2022-02-03 DIAGNOSIS — R5381 Other malaise: Secondary | ICD-10-CM | POA: Diagnosis not present

## 2022-02-03 DIAGNOSIS — N170 Acute kidney failure with tubular necrosis: Secondary | ICD-10-CM | POA: Diagnosis not present

## 2022-02-03 DIAGNOSIS — I10 Essential (primary) hypertension: Secondary | ICD-10-CM | POA: Diagnosis not present

## 2022-02-03 DIAGNOSIS — R918 Other nonspecific abnormal finding of lung field: Secondary | ICD-10-CM | POA: Diagnosis not present

## 2022-02-03 DIAGNOSIS — I252 Old myocardial infarction: Secondary | ICD-10-CM | POA: Diagnosis not present

## 2022-02-03 DIAGNOSIS — I340 Nonrheumatic mitral (valve) insufficiency: Secondary | ICD-10-CM | POA: Diagnosis present

## 2022-02-03 DIAGNOSIS — J9602 Acute respiratory failure with hypercapnia: Secondary | ICD-10-CM | POA: Diagnosis not present

## 2022-02-03 DIAGNOSIS — J90 Pleural effusion, not elsewhere classified: Secondary | ICD-10-CM | POA: Diagnosis not present

## 2022-02-03 DIAGNOSIS — R531 Weakness: Secondary | ICD-10-CM | POA: Diagnosis not present

## 2022-02-03 DIAGNOSIS — I110 Hypertensive heart disease with heart failure: Secondary | ICD-10-CM | POA: Diagnosis not present

## 2022-02-03 HISTORY — PX: RIGHT/LEFT HEART CATH AND CORONARY ANGIOGRAPHY: CATH118266

## 2022-02-03 HISTORY — PX: IABP INSERTION: CATH118242

## 2022-02-03 HISTORY — DX: Acute respiratory failure, unspecified whether with hypoxia or hypercapnia: J96.00

## 2022-02-03 LAB — PROCALCITONIN: Procalcitonin: 0.3 ng/mL

## 2022-02-03 LAB — POCT I-STAT 7, (LYTES, BLD GAS, ICA,H+H)
Acid-base deficit: 2 mmol/L (ref 0.0–2.0)
Acid-base deficit: 1 mmol/L (ref 0.0–2.0)
Acid-base deficit: 1 mmol/L (ref 0.0–2.0)
Acid-base deficit: 4 mmol/L — ABNORMAL HIGH (ref 0.0–2.0)
Bicarbonate: 23.4 mmol/L (ref 20.0–28.0)
Bicarbonate: 23.2 mmol/L (ref 20.0–28.0)
Bicarbonate: 24.5 mmol/L (ref 20.0–28.0)
Bicarbonate: 21.6 mmol/L (ref 20.0–28.0)
Calcium, Ion: 1.13 mmol/L — ABNORMAL LOW (ref 1.15–1.40)
Calcium, Ion: 1 mmol/L — ABNORMAL LOW (ref 1.15–1.40)
Calcium, Ion: 1.09 mmol/L — ABNORMAL LOW (ref 1.15–1.40)
Calcium, Ion: 1.02 mmol/L — ABNORMAL LOW (ref 1.15–1.40)
HCT: 39 % (ref 36.0–46.0)
HCT: 35 % — ABNORMAL LOW (ref 36.0–46.0)
HCT: 36 % (ref 36.0–46.0)
HCT: 35 % — ABNORMAL LOW (ref 36.0–46.0)
Hemoglobin: 13.3 g/dL (ref 12.0–15.0)
Hemoglobin: 11.9 g/dL — ABNORMAL LOW (ref 12.0–15.0)
Hemoglobin: 12.2 g/dL (ref 12.0–15.0)
Hemoglobin: 11.9 g/dL — ABNORMAL LOW (ref 12.0–15.0)
O2 Saturation: 94 %
O2 Saturation: 49 %
O2 Saturation: 89 %
O2 Saturation: 98 %
Patient temperature: 97.4
Patient temperature: 37.4
Potassium: 3.4 mmol/L — ABNORMAL LOW (ref 3.5–5.1)
Potassium: 4.1 mmol/L (ref 3.5–5.1)
Potassium: 4.3 mmol/L (ref 3.5–5.1)
Potassium: 3.6 mmol/L (ref 3.5–5.1)
Sodium: 141 mmol/L (ref 135–145)
Sodium: 141 mmol/L (ref 135–145)
Sodium: 143 mmol/L (ref 135–145)
Sodium: 141 mmol/L (ref 135–145)
TCO2: 25 mmol/L (ref 22–32)
TCO2: 24 mmol/L (ref 22–32)
TCO2: 26 mmol/L (ref 22–32)
TCO2: 23 mmol/L (ref 22–32)
pCO2 arterial: 39 mmHg (ref 32–48)
pCO2 arterial: 37.7 mmHg (ref 32–48)
pCO2 arterial: 42.4 mmHg (ref 32–48)
pCO2 arterial: 39.1 mmHg (ref 32–48)
pH, Arterial: 7.383 (ref 7.35–7.45)
pH, Arterial: 7.369 (ref 7.35–7.45)
pH, Arterial: 7.398 (ref 7.35–7.45)
pH, Arterial: 7.352 (ref 7.35–7.45)
pO2, Arterial: 69 mmHg — ABNORMAL LOW (ref 83–108)
pO2, Arterial: 27 mmHg — CL (ref 83–108)
pO2, Arterial: 57 mmHg — ABNORMAL LOW (ref 83–108)
pO2, Arterial: 111 mmHg — ABNORMAL HIGH (ref 83–108)

## 2022-02-03 LAB — POCT I-STAT EG7
Acid-base deficit: 2 mmol/L (ref 0.0–2.0)
Bicarbonate: 23.6 mmol/L (ref 20.0–28.0)
Calcium, Ion: 0.97 mmol/L — ABNORMAL LOW (ref 1.15–1.40)
HCT: 34 % — ABNORMAL LOW (ref 36.0–46.0)
Hemoglobin: 11.6 g/dL — ABNORMAL LOW (ref 12.0–15.0)
O2 Saturation: 52 %
Potassium: 4 mmol/L (ref 3.5–5.1)
Sodium: 143 mmol/L (ref 135–145)
TCO2: 25 mmol/L (ref 22–32)
pCO2, Ven: 40.5 mmHg — ABNORMAL LOW (ref 44–60)
pH, Ven: 7.373 (ref 7.25–7.43)
pO2, Ven: 28 mmHg — CL (ref 32–45)

## 2022-02-03 LAB — BASIC METABOLIC PANEL
Anion gap: 14 (ref 5–15)
Anion gap: 9 (ref 5–15)
BUN: 18 mg/dL (ref 8–23)
BUN: 14 mg/dL (ref 8–23)
CO2: 22 mmol/L (ref 22–32)
CO2: 27 mmol/L (ref 22–32)
Calcium: 8.8 mg/dL — ABNORMAL LOW (ref 8.9–10.3)
Calcium: 8.1 mg/dL — ABNORMAL LOW (ref 8.9–10.3)
Chloride: 103 mmol/L (ref 98–111)
Chloride: 106 mmol/L (ref 98–111)
Creatinine, Ser: 0.73 mg/dL (ref 0.44–1.00)
Creatinine, Ser: 0.85 mg/dL (ref 0.44–1.00)
GFR, Estimated: >60 mL/min (ref >60)
GFR, Estimated: >60 mL/min (ref >60)
Glucose, Bld: 178 mg/dL — ABNORMAL HIGH (ref 70–99)
Glucose, Bld: 178 mg/dL — ABNORMAL HIGH (ref 70–99)
Potassium: 3.8 mmol/L (ref 3.5–5.1)
Potassium: 3.7 mmol/L (ref 3.5–5.1)
Sodium: 139 mmol/L (ref 135–145)
Sodium: 142 mmol/L (ref 135–145)

## 2022-02-03 LAB — BRAIN NATRIURETIC PEPTIDE: B Natriuretic Peptide: 366.4 pg/mL — ABNORMAL HIGH (ref 0.0–100.0)

## 2022-02-03 LAB — RESP PANEL BY RT-PCR (FLU A&B, COVID) ARPGX2
Influenza A by PCR: NEGATIVE
Influenza B by PCR: NEGATIVE
SARS Coronavirus 2 by RT PCR: NEGATIVE

## 2022-02-03 LAB — MRSA NEXT GEN BY PCR, NASAL: MRSA by PCR Next Gen: NOT DETECTED

## 2022-02-03 LAB — URINALYSIS, ROUTINE W REFLEX MICROSCOPIC
Bilirubin Urine: NEGATIVE
Glucose, UA: 50 mg/dL — AB
Ketones, ur: 5 mg/dL — AB
Nitrite: NEGATIVE
Protein, ur: NEGATIVE mg/dL
Specific Gravity, Urine: 1.01 (ref 1.005–1.030)
pH: 5 (ref 5.0–8.0)

## 2022-02-03 LAB — CBC
HCT: 39.6 % (ref 36.0–46.0)
Hemoglobin: 12.7 g/dL (ref 12.0–15.0)
MCH: 28.2 pg (ref 26.0–34.0)
MCHC: 32.1 g/dL (ref 30.0–36.0)
MCV: 88 fL (ref 80.0–100.0)
Platelets: 402 10*3/uL — ABNORMAL HIGH (ref 150–400)
RBC: 4.5 MIL/uL (ref 3.87–5.11)
RDW: 14.8 % (ref 11.5–15.5)
WBC: 26.2 10*3/uL — ABNORMAL HIGH (ref 4.0–10.5)
nRBC: 0 % (ref 0.0–0.2)

## 2022-02-03 LAB — ECHOCARDIOGRAM COMPLETE
Area-P 1/2: 6.96 cm2
Calc EF: 26.7 %
Height: 64 in
MV M vel: 4.53 m/s
MV Peak grad: 82.1 mmHg
MV VTI: 1.13 cm2
Radius: 0.7 cm
S' Lateral: 4.3 cm
Single Plane A2C EF: 31.2 %
Single Plane A4C EF: 32.8 %
Weight: 2112.89 oz

## 2022-02-03 LAB — COOXEMETRY PANEL
Carboxyhemoglobin: 1.7 % — ABNORMAL HIGH (ref 0.5–1.5)
Methemoglobin: <0.7 % (ref 0.0–1.5)
O2 Saturation: 68.7 %
Total hemoglobin: 12.4 g/dL (ref 12.0–16.0)

## 2022-02-03 LAB — PROTIME-INR
INR: 0.9 (ref 0.8–1.2)
Prothrombin Time: 12.2 seconds (ref 11.4–15.2)

## 2022-02-03 LAB — MAGNESIUM: Magnesium: 2 mg/dL (ref 1.7–2.4)

## 2022-02-03 LAB — LACTIC ACID, PLASMA
Lactic Acid, Venous: 1.6 mmol/L (ref 0.5–1.9)
Lactic Acid, Venous: 2.3 mmol/L (ref 0.5–1.9)

## 2022-02-03 LAB — STREP PNEUMONIAE URINARY ANTIGEN: Strep Pneumo Urinary Antigen: NEGATIVE

## 2022-02-03 LAB — TSH: TSH: 2.059 u[IU]/mL (ref 0.350–4.500)

## 2022-02-03 LAB — APTT: aPTT: 28 seconds (ref 24–36)

## 2022-02-03 LAB — PHOSPHORUS: Phosphorus: 3 mg/dL (ref 2.5–4.6)

## 2022-02-03 LAB — GLUCOSE, CAPILLARY
Glucose-Capillary: 125 mg/dL — ABNORMAL HIGH (ref 70–99)
Glucose-Capillary: 136 mg/dL — ABNORMAL HIGH (ref 70–99)
Glucose-Capillary: 136 mg/dL — ABNORMAL HIGH (ref 70–99)
Glucose-Capillary: 190 mg/dL — ABNORMAL HIGH (ref 70–99)
Glucose-Capillary: 213 mg/dL — ABNORMAL HIGH (ref 70–99)

## 2022-02-03 LAB — POCT ACTIVATED CLOTTING TIME
Activated Clotting Time: 209 seconds
Activated Clotting Time: 197 seconds

## 2022-02-03 LAB — TROPONIN I (HIGH SENSITIVITY)
Troponin I (High Sensitivity): 708 ng/L (ref <18)
Troponin I (High Sensitivity): 6513 ng/L (ref <18)

## 2022-02-03 SURGERY — RIGHT/LEFT HEART CATH AND CORONARY ANGIOGRAPHY
Anesthesia: LOCAL

## 2022-02-03 MED ORDER — SODIUM CHLORIDE 0.9 % IV BOLUS
250.0000 mL | Freq: Once | INTRAVENOUS | Status: AC
  Administered 2022-02-03: 250 mL via INTRAVENOUS

## 2022-02-03 MED ORDER — LACTATED RINGERS IV SOLN: INTRAVENOUS | Status: DC

## 2022-02-03 MED ORDER — POLYETHYLENE GLYCOL 3350 17 G PO PACK: 17.0000 g | PACK | Freq: Every day | ORAL | Status: DC | PRN

## 2022-02-03 MED ORDER — FUROSEMIDE 10 MG/ML IJ SOLN
INTRAMUSCULAR | Status: AC
  Filled 2022-02-03: qty 4

## 2022-02-03 MED ORDER — ONDANSETRON HCL 4 MG/2ML IJ SOLN
INTRAMUSCULAR | Status: DC | PRN
  Administered 2022-02-03: 4 mg via INTRAVENOUS

## 2022-02-03 MED ORDER — ASPIRIN 81 MG PO CHEW
81.0000 mg | CHEWABLE_TABLET | Freq: Every day | ORAL | Status: DC
  Administered 2022-02-04 – 2022-02-05 (×2): 81 mg
  Filled 2022-02-03 (×2): qty 1

## 2022-02-03 MED ORDER — POTASSIUM CHLORIDE ER 10 MEQ PO TBCR
40.0000 meq | EXTENDED_RELEASE_TABLET | Freq: Every day | ORAL | Status: DC
  Filled 2022-02-03: qty 4

## 2022-02-03 MED ORDER — MIDAZOLAM HCL 2 MG/2ML IJ SOLN
INTRAMUSCULAR | Status: AC
  Filled 2022-02-03: qty 2

## 2022-02-03 MED ORDER — PANTOPRAZOLE SODIUM 40 MG IV SOLR
40.0000 mg | Freq: Every day | INTRAVENOUS | Status: DC
  Administered 2022-02-04 – 2022-02-05 (×2): 40 mg via INTRAVENOUS
  Filled 2022-02-03 (×2): qty 10

## 2022-02-03 MED ORDER — FENTANYL 2500MCG IN NS 250ML (10MCG/ML) PREMIX INFUSION
0.0000 ug/h | INTRAVENOUS | Status: DC
  Administered 2022-02-03: 50 ug/h via INTRAVENOUS
  Filled 2022-02-03: qty 250

## 2022-02-03 MED ORDER — HEPARIN SODIUM (PORCINE) 1000 UNIT/ML IJ SOLN
INTRAMUSCULAR | Status: AC
Start: 2022-02-03 — End: ?
  Filled 2022-02-03: qty 10

## 2022-02-03 MED ORDER — POLYETHYLENE GLYCOL 3350 17 G PO PACK
17.0000 g | PACK | Freq: Every day | ORAL | Status: DC
  Administered 2022-02-04 – 2022-02-05 (×2): 17 g
  Filled 2022-02-03 (×2): qty 1

## 2022-02-03 MED ORDER — CEFDINIR 250 MG/5ML PO SUSR
300.0000 mg | Freq: Two times a day (BID) | ORAL | Status: DC
  Administered 2022-02-03 – 2022-02-05 (×5): 300 mg
  Filled 2022-02-03 (×6): qty 6

## 2022-02-03 MED ORDER — ONDANSETRON HCL 4 MG/2ML IJ SOLN
4.0000 mg | Freq: Once | INTRAMUSCULAR | Status: AC
  Administered 2022-02-03: 4 mg via INTRAVENOUS
  Filled 2022-02-03: qty 2

## 2022-02-03 MED ORDER — NOREPINEPHRINE 4 MG/250ML-% IV SOLN
INTRAVENOUS | Status: AC
  Filled 2022-02-03: qty 250

## 2022-02-03 MED ORDER — METOPROLOL TARTRATE 5 MG/5ML IV SOLN
5.0000 mg | Freq: Two times a day (BID) | INTRAVENOUS | Status: DC
  Administered 2022-02-03: 5 mg via INTRAVENOUS
  Filled 2022-02-03: qty 5

## 2022-02-03 MED ORDER — DOCUSATE SODIUM 100 MG PO CAPS: 100.0000 mg | ORAL_CAPSULE | Freq: Two times a day (BID) | ORAL | Status: DC | PRN

## 2022-02-03 MED ORDER — SODIUM CHLORIDE 0.9% IV SOLUTION: INTRAVENOUS | Status: DC

## 2022-02-03 MED ORDER — NOREPINEPHRINE BITARTRATE 1 MG/ML IV SOLN
INTRAVENOUS | Status: DC | PRN
  Administered 2022-02-03: 4 ug/min via INTRAVENOUS

## 2022-02-03 MED ORDER — FENTANYL BOLUS VIA INFUSION
25.0000 ug | INTRAVENOUS | Status: DC | PRN
  Administered 2022-02-03 (×2): 50 ug via INTRAVENOUS
  Filled 2022-02-03: qty 100

## 2022-02-03 MED ORDER — AZITHROMYCIN 250 MG PO TABS
250.0000 mg | ORAL_TABLET | Freq: Every day | ORAL | Status: DC
  Filled 2022-02-03: qty 1

## 2022-02-03 MED ORDER — PERFLUTREN LIPID MICROSPHERE
1.0000 mL | INTRAVENOUS | Status: DC | PRN
  Administered 2022-02-03: 2 mL via INTRAVENOUS
  Filled 2022-02-03: qty 10

## 2022-02-03 MED ORDER — SODIUM CHLORIDE 0.9% FLUSH: 3.0000 mL | INTRAVENOUS | Status: DC | PRN

## 2022-02-03 MED ORDER — POTASSIUM CHLORIDE 20 MEQ PO PACK
40.0000 meq | PACK | Freq: Once | ORAL | Status: AC
  Administered 2022-02-03: 40 meq
  Filled 2022-02-03: qty 2

## 2022-02-03 MED ORDER — TRIMETHOPRIM 100 MG PO TABS
100.0000 mg | ORAL_TABLET | Freq: Every day | ORAL | Status: DC
  Administered 2022-02-04 – 2022-02-05 (×2): 100 mg
  Filled 2022-02-03 (×3): qty 1

## 2022-02-03 MED ORDER — SODIUM CHLORIDE 0.9 % IV SOLN: INTRAVENOUS | Status: DC

## 2022-02-03 MED ORDER — LACTATED RINGERS IV BOLUS (SEPSIS)
1000.0000 mL | Freq: Once | INTRAVENOUS | Status: AC
  Administered 2022-02-03: 1000 mL via INTRAVENOUS

## 2022-02-03 MED ORDER — ORAL CARE MOUTH RINSE
15.0000 mL | OROMUCOSAL | Status: DC
  Administered 2022-02-03 – 2022-02-06 (×19): 15 mL via OROMUCOSAL

## 2022-02-03 MED ORDER — ASPIRIN EC 81 MG PO TBEC: 81.0000 mg | DELAYED_RELEASE_TABLET | Freq: Every day | ORAL | Status: DC

## 2022-02-03 MED ORDER — MIDAZOLAM HCL 2 MG/2ML IJ SOLN
2.0000 mg | INTRAMUSCULAR | Status: DC | PRN
  Administered 2022-02-03: 2 mg via INTRAVENOUS
  Filled 2022-02-03: qty 2

## 2022-02-03 MED ORDER — DOCUSATE SODIUM 50 MG/5ML PO LIQD
100.0000 mg | Freq: Two times a day (BID) | ORAL | Status: DC
  Administered 2022-02-04 – 2022-02-05 (×4): 100 mg
  Filled 2022-02-03 (×4): qty 10

## 2022-02-03 MED ORDER — HEPARIN SODIUM (PORCINE) 1000 UNIT/ML IJ SOLN
INTRAMUSCULAR | Status: DC | PRN
  Administered 2022-02-03: 3000 [IU] via INTRAVENOUS
  Administered 2022-02-03 (×2): 4000 [IU] via INTRAVENOUS

## 2022-02-03 MED ORDER — MIDAZOLAM HCL 2 MG/2ML IJ SOLN
INTRAMUSCULAR | Status: DC | PRN
  Administered 2022-02-03: 2 mg via INTRAVENOUS

## 2022-02-03 MED ORDER — SODIUM CHLORIDE 0.9 % IV SOLN
2.0000 g | INTRAVENOUS | Status: DC
  Administered 2022-02-03: 2 g via INTRAVENOUS
  Filled 2022-02-03: qty 20

## 2022-02-03 MED ORDER — HEPARIN BOLUS VIA INFUSION
3500.0000 [IU] | Freq: Once | INTRAVENOUS | Status: AC
  Administered 2022-02-03: 3500 [IU] via INTRAVENOUS
  Filled 2022-02-03: qty 3500

## 2022-02-03 MED ORDER — FENTANYL CITRATE PF 50 MCG/ML IJ SOSY: 25.0000 ug | PREFILLED_SYRINGE | INTRAMUSCULAR | Status: DC | PRN

## 2022-02-03 MED ORDER — ASPIRIN 81 MG PO CHEW
324.0000 mg | CHEWABLE_TABLET | Freq: Once | ORAL | Status: AC
  Administered 2022-02-03: 324 mg via ORAL
  Filled 2022-02-03: qty 4

## 2022-02-03 MED ORDER — ROCURONIUM BROMIDE 10 MG/ML (PF) SYRINGE
PREFILLED_SYRINGE | INTRAVENOUS | Status: AC
  Filled 2022-02-03: qty 10

## 2022-02-03 MED ORDER — TRIMETHOPRIM 100 MG PO TABS
100.0000 mg | ORAL_TABLET | Freq: Every day | ORAL | Status: DC
  Administered 2022-02-03: 100 mg via ORAL
  Filled 2022-02-03: qty 1

## 2022-02-03 MED ORDER — ONDANSETRON HCL 4 MG/2ML IJ SOLN
INTRAMUSCULAR | Status: AC
  Filled 2022-02-03: qty 2

## 2022-02-03 MED ORDER — SODIUM CHLORIDE 0.9% FLUSH
3.0000 mL | Freq: Two times a day (BID) | INTRAVENOUS | Status: DC
  Administered 2022-02-03 – 2022-02-05 (×4): 3 mL via INTRAVENOUS

## 2022-02-03 MED ORDER — ETOMIDATE 2 MG/ML IV SOLN
INTRAVENOUS | Status: AC
  Filled 2022-02-03: qty 10

## 2022-02-03 MED ORDER — LABETALOL HCL 5 MG/ML IV SOLN: 10.0000 mg | INTRAVENOUS | Status: DC | PRN

## 2022-02-03 MED ORDER — ACETAMINOPHEN 325 MG PO TABS
650.0000 mg | ORAL_TABLET | ORAL | Status: DC | PRN
  Administered 2022-02-04: 650 mg via ORAL
  Filled 2022-02-03: qty 2

## 2022-02-03 MED ORDER — METOPROLOL TARTRATE 25 MG PO TABS
25.0000 mg | ORAL_TABLET | Freq: Two times a day (BID) | ORAL | Status: DC
  Administered 2022-02-03: 25 mg via ORAL
  Filled 2022-02-03: qty 1

## 2022-02-03 MED ORDER — SODIUM CHLORIDE 0.9% IV SOLUTION: INTRAVENOUS | Status: DC | PRN

## 2022-02-03 MED ORDER — HEPARIN (PORCINE) IN NACL 1000-0.9 UT/500ML-% IV SOLN
INTRAVENOUS | Status: AC
Start: 2022-02-03 — End: ?
  Filled 2022-02-03: qty 1000

## 2022-02-03 MED ORDER — IOHEXOL 350 MG/ML SOLN
INTRAVENOUS | Status: DC | PRN
  Administered 2022-02-03: 135 mL

## 2022-02-03 MED ORDER — ROSUVASTATIN CALCIUM 5 MG PO TABS
10.0000 mg | ORAL_TABLET | Freq: Every day | ORAL | Status: DC
  Administered 2022-02-03: 10 mg via ORAL
  Filled 2022-02-03: qty 2

## 2022-02-03 MED ORDER — ASPIRIN 325 MG PO TABS
325.0000 mg | ORAL_TABLET | Freq: Every day | ORAL | Status: DC
  Filled 2022-02-03: qty 1

## 2022-02-03 MED ORDER — TIROFIBAN HCL IV 12.5 MG/250 ML
0.0750 ug/kg/min | INTRAVENOUS | Status: DC
  Administered 2022-02-04: 0.075 ug/kg/min via INTRAVENOUS
  Filled 2022-02-03: qty 250

## 2022-02-03 MED ORDER — HEPARIN (PORCINE) 25000 UT/250ML-% IV SOLN
700.0000 [IU]/h | INTRAVENOUS | Status: DC
  Administered 2022-02-03: 700 [IU]/h via INTRAVENOUS
  Filled 2022-02-03: qty 250

## 2022-02-03 MED ORDER — CALCIUM GLUCONATE-NACL 2-0.675 GM/100ML-% IV SOLN
2.0000 g | Freq: Once | INTRAVENOUS | Status: AC
  Administered 2022-02-04: 2000 mg via INTRAVENOUS
  Filled 2022-02-03: qty 100

## 2022-02-03 MED ORDER — INSULIN ASPART 100 UNIT/ML IJ SOLN
0.0000 [IU] | INTRAMUSCULAR | Status: DC
  Administered 2022-02-03: 2 [IU] via SUBCUTANEOUS
  Administered 2022-02-03: 5 [IU] via SUBCUTANEOUS
  Administered 2022-02-03: 3 [IU] via SUBCUTANEOUS
  Administered 2022-02-03 (×2): 2 [IU] via SUBCUTANEOUS
  Administered 2022-02-04: 3 [IU] via SUBCUTANEOUS
  Administered 2022-02-04 (×3): 2 [IU] via SUBCUTANEOUS
  Administered 2022-02-05 (×2): 3 [IU] via SUBCUTANEOUS
  Administered 2022-02-05 (×3): 2 [IU] via SUBCUTANEOUS
  Administered 2022-02-05 (×2): 3 [IU] via SUBCUTANEOUS
  Administered 2022-02-06: 2 [IU] via SUBCUTANEOUS

## 2022-02-03 MED ORDER — FENTANYL CITRATE (PF) 100 MCG/2ML IJ SOLN
INTRAMUSCULAR | Status: AC
  Filled 2022-02-03: qty 2

## 2022-02-03 MED ORDER — SODIUM CHLORIDE 0.9% FLUSH: 3.0000 mL | Freq: Two times a day (BID) | INTRAVENOUS | Status: DC

## 2022-02-03 MED ORDER — NOREPINEPHRINE BITARTRATE 1 MG/ML IV SOLN
INTRAVENOUS | Status: AC | PRN
  Administered 2022-02-03: 6 ug/min via INTRAVENOUS

## 2022-02-03 MED ORDER — NOREPINEPHRINE 16 MG/250ML-% IV SOLN
0.0000 ug/min | INTRAVENOUS | Status: DC
  Administered 2022-02-03: 4 ug/min via INTRAVENOUS
  Filled 2022-02-03: qty 250

## 2022-02-03 MED ORDER — SODIUM CHLORIDE 0.9 % IV SOLN
500.0000 mg | INTRAVENOUS | Status: DC
  Administered 2022-02-03: 500 mg via INTRAVENOUS
  Filled 2022-02-03: qty 5

## 2022-02-03 MED ORDER — ENOXAPARIN SODIUM 60 MG/0.6ML IJ SOSY
1.0000 mg/kg | PREFILLED_SYRINGE | Freq: Two times a day (BID) | INTRAMUSCULAR | Status: DC
  Administered 2022-02-03: 60 mg via SUBCUTANEOUS
  Filled 2022-02-03 (×2): qty 0.6

## 2022-02-03 MED ORDER — CHLORHEXIDINE GLUCONATE CLOTH 2 % EX PADS
6.0000 | MEDICATED_PAD | Freq: Every day | CUTANEOUS | Status: DC
  Administered 2022-02-03: 6 via TOPICAL

## 2022-02-03 MED ORDER — ALBUTEROL SULFATE (2.5 MG/3ML) 0.083% IN NEBU: 2.5000 mg | INHALATION_SOLUTION | RESPIRATORY_TRACT | Status: DC | PRN

## 2022-02-03 MED ORDER — LORAZEPAM 2 MG/ML IJ SOLN
1.0000 mg | Freq: Once | INTRAMUSCULAR | Status: AC
  Administered 2022-02-03: 1 mg via INTRAVENOUS
  Filled 2022-02-03: qty 1

## 2022-02-03 MED ORDER — ETOMIDATE 2 MG/ML IV SOLN
INTRAVENOUS | Status: DC | PRN
  Administered 2022-02-03: 10 mg via INTRAVENOUS

## 2022-02-03 MED ORDER — FUROSEMIDE 10 MG/ML IJ SOLN
40.0000 mg | Freq: Once | INTRAMUSCULAR | Status: AC
  Administered 2022-02-03: 40 mg via INTRAVENOUS
  Filled 2022-02-03: qty 4

## 2022-02-03 MED ORDER — CHLORHEXIDINE GLUCONATE 0.12% ORAL RINSE (MEDLINE KIT)
15.0000 mL | Freq: Two times a day (BID) | OROMUCOSAL | Status: DC
  Administered 2022-02-03 – 2022-02-05 (×5): 15 mL via OROMUCOSAL

## 2022-02-03 MED ORDER — HYDRALAZINE HCL 20 MG/ML IJ SOLN: 10.0000 mg | INTRAMUSCULAR | Status: AC | PRN

## 2022-02-03 MED ORDER — LEVOTHYROXINE SODIUM 25 MCG PO TABS
25.0000 ug | ORAL_TABLET | Freq: Every day | ORAL | Status: DC
  Administered 2022-02-03: 25 ug via ORAL
  Filled 2022-02-03: qty 1

## 2022-02-03 MED ORDER — FENTANYL 2500MCG IN NS 250ML (10MCG/ML) PREMIX INFUSION
INTRAVENOUS | Status: AC
  Filled 2022-02-03: qty 250

## 2022-02-03 MED ORDER — FENTANYL CITRATE (PF) 100 MCG/2ML IJ SOLN: 25.0000 ug | INTRAMUSCULAR | Status: DC | PRN

## 2022-02-03 MED ORDER — SODIUM CHLORIDE 0.9 % IV SOLN: 250.0000 mL | INTRAVENOUS | Status: DC | PRN

## 2022-02-03 MED ORDER — SODIUM CHLORIDE 0.9 % IV SOLN
INTRAVENOUS | Status: DC | PRN
  Administered 2022-02-03: 10 mL/h via INTRAVENOUS

## 2022-02-03 MED ORDER — CEFDINIR 300 MG PO CAPS
300.0000 mg | ORAL_CAPSULE | Freq: Two times a day (BID) | ORAL | Status: DC
  Filled 2022-02-03: qty 1

## 2022-02-03 MED ORDER — HEPARIN SODIUM (PORCINE) 1000 UNIT/ML IJ SOLN
INTRAMUSCULAR | Status: AC
  Filled 2022-02-03: qty 10

## 2022-02-03 MED ORDER — HEPARIN (PORCINE) IN NACL 1000-0.9 UT/500ML-% IV SOLN
INTRAVENOUS | Status: DC | PRN
  Administered 2022-02-03 (×2): 500 mL

## 2022-02-03 MED ORDER — NOREPINEPHRINE 4 MG/250ML-% IV SOLN: 0.0000 ug/min | INTRAVENOUS | Status: DC

## 2022-02-03 MED ORDER — FUROSEMIDE 10 MG/ML IJ SOLN
80.0000 mg | Freq: Once | INTRAMUSCULAR | Status: DC
Start: 2022-02-03 — End: 2022-02-04

## 2022-02-03 MED ORDER — LEVOTHYROXINE SODIUM 25 MCG PO TABS
25.0000 ug | ORAL_TABLET | Freq: Every day | ORAL | Status: DC
  Administered 2022-02-04 – 2022-02-05 (×2): 25 ug
  Filled 2022-02-03 (×2): qty 1

## 2022-02-03 MED ORDER — PANTOPRAZOLE SODIUM 40 MG PO TBEC
40.0000 mg | DELAYED_RELEASE_TABLET | Freq: Every day | ORAL | Status: DC
  Administered 2022-02-03: 40 mg via ORAL
  Filled 2022-02-03: qty 1

## 2022-02-03 MED ORDER — FENTANYL CITRATE (PF) 100 MCG/2ML IJ SOLN
INTRAMUSCULAR | Status: DC | PRN
  Administered 2022-02-03: 100 ug via INTRAVENOUS

## 2022-02-03 MED ORDER — LIDOCAINE HCL (PF) 1 % IJ SOLN
INTRAMUSCULAR | Status: AC
Start: 2022-02-03 — End: ?
  Filled 2022-02-03: qty 30

## 2022-02-03 MED ORDER — FUROSEMIDE 10 MG/ML IJ SOLN
4.0000 mg/h | INTRAVENOUS | Status: DC
  Administered 2022-02-03 – 2022-02-05 (×3): 4 mg/h via INTRAVENOUS
  Filled 2022-02-03 (×2): qty 20

## 2022-02-03 MED ORDER — FUROSEMIDE 10 MG/ML IJ SOLN
INTRAMUSCULAR | Status: DC | PRN
Start: 2022-02-03 — End: 2022-02-03
  Administered 2022-02-03: 80 mg via INTRAVENOUS

## 2022-02-03 MED ORDER — POTASSIUM CHLORIDE 10 MEQ/100ML IV SOLN
10.0000 meq | INTRAVENOUS | Status: AC
  Administered 2022-02-03 (×3): 10 meq via INTRAVENOUS
  Filled 2022-02-03 (×3): qty 100

## 2022-02-03 MED ORDER — ONDANSETRON HCL 4 MG/2ML IJ SOLN: 4.0000 mg | Freq: Four times a day (QID) | INTRAMUSCULAR | Status: DC | PRN

## 2022-02-03 MED ORDER — ROSUVASTATIN CALCIUM 5 MG PO TABS
10.0000 mg | ORAL_TABLET | Freq: Every day | ORAL | Status: DC
  Administered 2022-02-04 – 2022-02-05 (×2): 10 mg
  Filled 2022-02-03 (×2): qty 2

## 2022-02-03 MED ORDER — FENTANYL CITRATE (PF) 100 MCG/2ML IJ SOLN: 25.0000 ug | Freq: Once | INTRAMUSCULAR | Status: DC

## 2022-02-03 MED ORDER — LIDOCAINE HCL (PF) 1 % IJ SOLN
INTRAMUSCULAR | Status: DC | PRN
Start: 2022-02-03 — End: 2022-02-03
  Administered 2022-02-03: 12 mL
  Administered 2022-02-03: 10 mL

## 2022-02-03 MED ORDER — ASPIRIN 81 MG PO CHEW: 81.0000 mg | CHEWABLE_TABLET | ORAL | Status: DC

## 2022-02-03 MED ORDER — ROCURONIUM BROMIDE 50 MG/5ML IV SOLN
INTRAVENOUS | Status: DC | PRN
  Administered 2022-02-03: 50 mg via INTRAVENOUS

## 2022-02-03 MED ORDER — HEPARIN (PORCINE) 25000 UT/250ML-% IV SOLN
900.0000 [IU]/h | INTRAVENOUS | Status: DC
  Administered 2022-02-03: 600 [IU]/h via INTRAVENOUS
  Administered 2022-02-05: 800 [IU]/h via INTRAVENOUS
  Filled 2022-02-03 (×2): qty 250

## 2022-02-03 MED ORDER — POTASSIUM CHLORIDE CRYS ER 20 MEQ PO TBCR
40.0000 meq | EXTENDED_RELEASE_TABLET | Freq: Once | ORAL | Status: AC
  Administered 2022-02-03: 40 meq via ORAL
  Filled 2022-02-03: qty 2

## 2022-02-03 SURGICAL SUPPLY — 18 items
BALLN IABP SENSA PLUS 7.5F 40C (BALLOONS) ×4
BALLOON IABP SENS PLUS 7.5F40C (BALLOONS) IMPLANT
CATH INFINITI 5FR MULTPACK ANG (CATHETERS) ×1 IMPLANT
CATH SWAN GANZ VIP 7.5F (CATHETERS) ×1 IMPLANT
ELECT DEFIB PAD ADLT CADENCE (PAD) ×1 IMPLANT
KIT HEART LEFT (KITS) ×2 IMPLANT
KIT MICROPUNCTURE NIT STIFF (SHEATH) ×1 IMPLANT
MAT PREVALON FULL STRYKER (MISCELLANEOUS) ×1 IMPLANT
PACK CARDIAC CATHETERIZATION (CUSTOM PROCEDURE TRAY) ×2 IMPLANT
SHEATH PINNACLE 5F 10CM (SHEATH) ×2 IMPLANT
SHEATH PINNACLE 8F 10CM (SHEATH) ×2 IMPLANT
SHEATH PROBE COVER 6X72 (BAG) ×1 IMPLANT
SLEEVE REPOSITIONING LENGTH 30 (MISCELLANEOUS) ×1 IMPLANT
SYR MEDRAD MARK 7 150ML (SYRINGE) ×1 IMPLANT
TRANSDUCER W/STOPCOCK (MISCELLANEOUS) ×2 IMPLANT
TUBING CIL FLEX 10 FLL-RA (TUBING) ×2 IMPLANT
WIRE EMERALD 3MM-J .035X150CM (WIRE) ×1 IMPLANT
WIRE HI TORQ VERSACORE-J 145CM (WIRE) ×2 IMPLANT

## 2022-02-03 NOTE — H&P (Signed)
? ?NAME:  Tonya Hamilton, MRN:  [id number], DOB:  May 13, 1946, LOS: 0 ?ADMISSION DATE:  02/02/2022, CONSULTATION DATE:  02/03/22 ?REFERRING MD:  Christy Gentles CHIEF COMPLAINT:  Dyspnea  ? ?History of Present Illness:  ?Tonya Hamilton is a 76 y.o. female who has a PMH as outlined below.  She presented to [facility] ED 4/3 with dyspnea.  She states that symptoms began suddenly and progressed.  She also endorsed cough intimately productive of sputum.  No fevers/chills/sweats, chest pain, N/V/D, abdominal pain, myalgias, exposures known sick contacts, recent travel. ? ?In ED, she was found to be hypoxic.  CXR showed bilateral opacities and bilateral mild edema.  Troponin was elevated at 708 with repeat up to 1454.  BNP mildly elevated at 366.  EKG with ST depressions and global ischemia.  Leukocytosis to 23.6.  She was initially placed on Ventimask but was later upgraded to BiPAP.  She continued to require BiPAP which she did feel subjective improvement in symptoms.  Patient stable subsequently called for transfer to ICU at [facility] for further evaluation management. ? ?Pertinent  Medical History:  ?has History of colonic diverticulitis; Osteoarthritis; Hypertension; Hyperlipidemia; Hypothyroid; GERD (gastroesophageal reflux disease); Osteopenia; Anxiety; Osteoarthritis of right hip; Angioedema; Chronic intractable headache; Occipital neuralgia of left side; Neck pain; Hyperreflexia; Arthritis, multiple joint involvement; Spasmodic dysphonia; Acute respiratory failure ([place]); and Respiratory failure ([place]) on their problem list. ? ? ?Significant Hospital Events: ?Including procedures, antibiotic start and stop dates in addition to other pertinent events   ?4/4 admit. ? ?Interim History / Subjective:  ?Remains on BiPAP, comfortable.  She does feel that this is helping and feels a fair bit better.  Denies any chest pain. ? ?Objective:  ?Blood pressure (!) 120/95, pulse (!) 106, temperature (!) 97.2 ?F (36.2 ?C), temperature  source Temporal, resp. rate (!) 27, height '5\' 4"'$  (1.626 m), weight 60 kg, SpO2 100 %. ?   ?Vent Mode: BIPAP ?FiO2 (%):  [55 %-60 %] 60 % ?PEEP:  [5 cmH20] 5 cmH20 ?Pressure Support:  [8 cmH20] 8 cmH20  ? ?Intake/Output Summary (Last 24 hours) at 02/03/2022 0409 ?Last data filed at 02/03/2022 0220 ?Gross per 24 hour  ?Intake 350.19 ml  ?Output --  ?Net 350.19 ml  ? ?Filed Weights  ? 02/03/22 0300  ?Weight: 60 kg  ? ? ?Examination: ?General: Adult female, resting in bed, in NAD. ?Neuro: A&O x 3, no deficits. ?HEENT: [place]/AT. Sclerae anicteric. BiPAP in place. ?Cardiovascular: RRR, no M/R/G.  ?Lungs: Respirations even and unlabored.  Coarse crackles bilaterally. ?Abdomen: BS x 4, soft, NT/ND.  ?Musculoskeletal: No gross deformities, no edema.  ?Skin: Intact, warm, no rashes. ? ?Labs/imaging personally reviewed:  ?CXR 4/3 > bilateral opacities and edema. ? ?Assessment & Plan:  ? ?Acute hypoxic respiratory failure - multifactorial felt to be due to CAP, possible NSTEMI, mild pulmonary edema. ?- Continue BiPAP. ?- Empiric CAP coverage. ?- Follow cultures. ?- 40 mg Lasix x1. ?- Empiric heparin. ?- Empiric ASA, low-dose metoprolol. ?- Follow troponin. ?- Assess echo. ? - Consider cardiology consult. ? ?Hx frequent/chronic UTIs - on daily Bactrim as prophylaxis. ?- Continue home Bactrim. ? ?Hx hypothyroidism. ?- Continue home Synthroid. ? ?Hx HTN, HLD. ?- Hold home Amlodipine. ? ?Best practice (evaluated daily):  ?Diet/type: NPO ?DVT prophylaxis: systemic heparin ?GI prophylaxis: N/A ?Lines: N/A ?Foley:  Yes, and it is still needed ?Code Status:  full code ?Last date of multidisciplinary goals of care discussion: Not yet. ? ?Labs   ?CBC: ?Recent Labs  ?Lab  02/02/22 ?2321  ?WBC 23.6*  ?NEUTROABS 21.0*  ?HGB 11.9*  ?HCT 36.6  ?MCV 86.7  ?PLT 395  ? ? ?Basic Metabolic Panel: ?Recent Labs  ?Lab 02/02/22 ?2321  ?NA 139  ?K 3.8  ?CL 103  ?CO2 22  ?GLUCOSE 178*  ?BUN 18  ?CREATININE 0.73  ?CALCIUM 8.8*  ? ?GFR: ?Estimated Creatinine  Clearance: 51.7 mL/min (by C-G formula based on SCr of 0.73 mg/dL). ?Recent Labs  ?Lab 02/02/22 ?2321  ?WBC 23.6*  ?LATICACIDVEN 1.6  ? ? ?Liver Function Tests: ?No results for input(s): AST, ALT, ALKPHOS, BILITOT, PROT, ALBUMIN in the last 168 hours. ?No results for input(s): LIPASE, AMYLASE in the last 168 hours. ?No results for input(s): AMMONIA in the last 168 hours. ? ?ABG ?No results found for: PHART, PCO2ART, PO2ART, HCO3, TCO2, ACIDBASEDEF, O2SAT  ? ?Coagulation Profile: ?Recent Labs  ?Lab 02/02/22 ?2321  ?INR 0.9  ? ? ?Cardiac Enzymes: ?No results for input(s): CKTOTAL, CKMB, CKMBINDEX, TROPONINI in the last 168 hours. ? ?HbA1C: ?No results found for: HGBA1C ? ?CBG: ?No results for input(s): GLUCAP in the last 168 hours. ? ?Review of Systems:   ?All negative; except for those that are bolded, which indicate positives. ? ?Constitutional: weight loss, weight gain, night sweats, fevers, chills, fatigue, weakness.  ?HEENT: headaches, sore throat, sneezing, nasal congestion, post nasal drip, difficulty swallowing, tooth/dental problems, visual complaints, visual changes, ear aches. ?Neuro: difficulty with speech, weakness, numbness, ataxia. ?CV:  chest pain, orthopnea, PND, swelling in lower extremities, dizziness, palpitations, syncope.  ?Resp: cough, hemoptysis, dyspnea, wheezing. ?GI: heartburn, indigestion, abdominal pain, nausea, vomiting, diarrhea, constipation, change in bowel habits, loss of appetite, hematemesis, melena, hematochezia.  ?GU: dysuria, change in color of urine, urgency or frequency, flank pain, hematuria. ?MSK: joint pain or swelling, decreased range of motion. ?Psych: change in mood or affect, depression, anxiety, suicidal ideations, homicidal ideations. ?Skin: rash, itching, bruising. ? ? ?Past Medical History:  ?She,  has a past medical history of Arthritis, Colon polyps, Diverticulitis, Hyperlipidemia, Hypertension, Hypothyroidism, PONV (postoperative nausea and vomiting), Thyroid  disease, and UTI (urinary tract infection).  ? ?Surgical History:  ? ?Past Surgical History:  ?Procedure Laterality Date  ? ABDOMINAL HYSTERECTOMY  1993  ? TAH, endometriosis  ? CHOLECYSTECTOMY  2002  ? TOTAL HIP ARTHROPLASTY Right 05/31/2013  ? Procedure: RIGHT TOTAL HIP ARTHROPLASTY ANTERIOR APPROACH;  Surgeon: Gearlean Alf, MD;  Location: [facility];  Service: Orthopedics;  Laterality: Right;  ?  ? ?Social History:  ? reports that she has never smoked. She has never used smokeless tobacco. She reports that she does not drink alcohol and does not use drugs.  ? ?Family History:  ?Her family history includes Arthritis in her mother; Heart Problems in her maternal grandfather; Heart disease in her father and mother; Hypertension in her father and mother; Stroke in her father. There is no history of Breast cancer.  ? ?Allergies ?Allergies  ?Allergen Reactions  ? Botox [Onabotulinumtoxina]   ?  Was given too much botox and at night, felt like her throat was closing up.   ? Diclofenac   ? Lisinopril Swelling  ? Simvastatin   ? Statins   ?  Body aches all over  ? Sulfa Antibiotics Itching  ?  ? ?Home Medications  ?Prior to Admission medications   ?Medication Sig Start Date End Date Taking? Authorizing Provider  ?amLODipine (NORVASC) 5 MG tablet TAKE 1 TABLET BY MOUTH EVERY DAY 01/15/22   Burchette, Alinda Sierras, MD  ?aspirin-acetaminophen-caffeine (EXCEDRIN MIGRAINE)  250-250-65 MG tablet Take 2 tablets by mouth every 6 (six) hours as needed for headache.    [provider]  ?betamethasone dipropionate 0.05 % cream Apply to affected rash two times daily no longer than 2 weeks of continuous rash. 01/20/22   Burchette, Alinda Sierras, MD  ?bismuth subsalicylate (PEPTO BISMOL) 262 MG/15ML suspension Take 30 mLs by mouth every 6 (six) hours as needed for indigestion or diarrhea or loose stools.    [provider]  ?cephALEXin (KEFLEX) 500 MG capsule Take 1 capsule (500 mg total) by mouth 3 (three) times daily. 01/20/22    Burchette, Alinda Sierras, MD  ?ciclopirox (LOPROX) 0.77 % cream Apply topically 2 (two) times daily. 12/23/21   Burchette, Alinda Sierras, MD  ?diphenhydramine-acetaminophen (TYLENOL PM) 25-500 MG TABS tablet Take 2 tablets by mo

## 2022-02-03 NOTE — ED Notes (Signed)
Report given to Deboraha Sprang, RT at [facility] ?

## 2022-02-03 NOTE — ED Notes (Signed)
Patient unable to tolerate venti mask, patient states it is too hot. Patient placed on High Flow [place] at this time at 15L/min. SpO2 88%. ?

## 2022-02-03 NOTE — Progress Notes (Signed)
?  Echocardiogram ?2D Echocardiogram has been performed. ? ?Tonya Hamilton ?02/03/2022, 8:45 AM ?

## 2022-02-03 NOTE — ED Notes (Signed)
[facility] arrived to transport pt. Pt stable at time of departure ?

## 2022-02-03 NOTE — Progress Notes (Signed)
eLink Physician-Brief Progress Note ?Patient Name: Tonya Hamilton ?DOB: 23-Apr-1946 ?MRN: [id number] ? ? ?Date of Service ? 02/03/2022  ?HPI/Events of Note ? Lactic Acid = 2.3.  ?eICU Interventions ? Will bolus with 0.9 NaCl 250 mL IV over 1 hour now.   ? ? ? ?Intervention Category ?Major Interventions: Acid-Base disturbance - evaluation and management ? ?Tonya Hamilton ?02/03/2022, 6:59 AM ?

## 2022-02-03 NOTE — Interval H&P Note (Signed)
History and Physical Interval Note: ? ?02/03/2022 ?4:15 PM ? ?Val Riles  has presented today for surgery, with the diagnosis of urgent NSTEMI, CARDIOGENIC SHOCK, CONGESTIVE HEART FAILURE.  The various methods of treatment have been discussed with the patient and family. After consideration of risks, benefits and other options for treatment, the patient has consented to  Procedure(s): ?RIGHT/LEFT HEART CATH AND CORONARY ANGIOGRAPHY (N/A)  ?PERCUTANEOUS CORONARY INTERVENTION ? ? ?Cath Lab Visit (complete for each Cath Lab visit) ? ?Clinical Evaluation Leading to the Procedure:  ? ?ACS: Yes.   ? ?Non-ACS:   ? ?Anginal Classification: CCS IV ? ?Anti-ischemic medical therapy: Minimal Therapy (1 class of medications) ? ?Non-Invasive Test Results: High-risk stress test findings: cardiac mortality >3%/year - ECHO EF 30-35%, Ischemic MR> ? ?Prior CABG: No previous CABG ? ? ? ?as a surgical intervention.  The patient's history has been reviewed, patient examined, no change in status, stable for surgery.  I have reviewed the patient's chart and labs.  Questions were answered to the patient's satisfaction.   ? ? ?Glenetta Hew ? ? ?

## 2022-02-03 NOTE — Progress Notes (Signed)
eLink Physician-Brief Progress Note ?Patient Name: Tonya Hamilton ?DOB: 04-30-1946 ?MRN: [id number] ? ? ?Date of Service ? 02/03/2022  ?HPI/Events of Note ? Hypocalcemia - Ionized Ca++ = 1.02.   ?eICU Interventions ? Will replace Ca++.   ? ? ? ?Intervention Category ?Major Interventions: Electrolyte abnormality - evaluation and management ? ?Tonya Hamilton ?02/03/2022, 11:22 PM ?

## 2022-02-03 NOTE — Progress Notes (Signed)
ANTICOAGULATION CONSULT NOTE - Initial Consult ? ?Pharmacy Consult for Heparin ?Indication: chest pain/ACS/ IABP ? ?Allergies  ?Allergen Reactions  ? Botox [Onabotulinumtoxina]   ?  Was given too much botox and at night, felt like her throat was closing up.   ? Diclofenac Other (See Comments)  ?  Unknown reaction per pt and SO  ? Lisinopril Swelling  ? Simvastatin Other (See Comments)  ?  myalgias  ? Statins Other (See Comments)  ?  myalgias  ? Sulfa Antibiotics Itching  ? ? ?Patient Measurements: ?Height: '5\' 4"'$  (162.6 cm) ?Weight: 59.9 kg (132 lb 0.9 oz) ?IBW/kg (Calculated) : 54.7 ?Heparin Dosing Weight: 60 kg ? ?Vital Signs: ?Temp: 98.3 ?F (36.8 ?C) (04/04 1140) ?Temp Source: Oral (04/04 1140) ?BP: 166/54 (04/04 1957) ?Pulse Rate: 106 (04/04 1957) ? ?Labs: ?Recent Labs  ?  02/02/22 ?2321 02/03/22 ?0210 02/03/22 ?2774 02/03/22 ?0601 02/03/22 ?1655 02/03/22 ?1657 02/03/22 ?1658  ?HGB 11.9*  --    < > 12.7 12.2 11.9* 11.6*  ?HCT 36.6  --    < > 39.6 36.0 35.0* 34.0*  ?PLT 395  --   --  402*  --   --   --   ?APTT 28  --   --   --   --   --   --   ?LABPROT 12.2  --   --   --   --   --   --   ?INR 0.9  --   --   --   --   --   --   ?CREATININE 0.73  --   --  0.85  --   --   --   ?TROPONINIHS 708* 1,454*  --  6,513*  --   --   --   ? < > = values in this interval not displayed.  ? ? ? ?Estimated Creatinine Clearance: 48.6 mL/min (by C-G formula based on SCr of 0.85 mg/dL). ? ? ?Medical History: ?Past Medical History:  ?Diagnosis Date  ? Arthritis   ? Colon polyps   ? Diverticulitis   ? Hyperlipidemia   ? Hypertension   ? Hypothyroidism   ? PONV (postoperative nausea and vomiting)   ? Thyroid disease   ? UTI (urinary tract infection)   ? ? ? ?Assessment: ?76 y.o. F presents with cough/SOB. Trop elevated to 1454. To begin heparin for ACS. No AC PTA. CBC ok on admission.  ?Heparin changed to enoxaparin this am pre-cath > in cath lab was intubated and IABP placed. ?Now awaiting TCTS evaluation.   ?Restart low dose heparin  drip  ? ?Goal of Therapy:  ?Heparin level 0.2-0.5  ?Monitor platelets by anticoagulation protocol: Yes ?  ?Plan:  ?Resume Heparin gtt at 600 units/hr ?Daily heparin level and CBC ? ? ? ?Bonnita Nasuti Pharm.D. CPP, BCPS ?Clinical Pharmacist ?[phone] ?02/03/2022 8:45 PM  ? ? ? ?

## 2022-02-03 NOTE — ED Notes (Signed)
ED Provider at bedside. Verbal order to d/c IVF and continue abx ?

## 2022-02-03 NOTE — Consult Note (Addendum)
?Cardiology Consultation:  ? ?Patient ID: Tonya Hamilton ?MRN: [id number]; DOB: Sep 22, 1946 ? ?Admit date: 02/02/2022 ?Date of Consult: 02/03/2022 ? ?PCP:  [name], MD ?  ?[facility] Providers ?Cardiologist: New to Dr. Gwenlyn Found ? ? ? ?Patient Profile:  ? ?Tonya Hamilton is a 76 y.o. female with a PMH of hypertension, hyperlipidemia, hypothyroidism, diverticulitis, arthritis, GERD, chronic/frequent UTI prophylactic Bactrim, who is being seen 02/03/2022 for the evaluation of non-STEMI at the request of Dr. Oletta Darter. ? ?History of Present Illness:  ? ?Ms. Chowning with above past medical history presented to the [facility] ER last night with chief complaints of shortness of breath, cough, and feeling unwell.  Per records review, patient began feeling short of breath suddenly, had some productive cough, denied any fever or chills, chest pain, N/V/D.  She was found to be hypoxic on room air with pulse ox 88% at ED. Initial diagnostic revealed elevated BNP 366, elevated high sensitive troponin 708 > 1454.  Lactic acid WNL.  CBC revealed leukocytosis with WBC 23.6 K.  INR 0.9.  Flu and COVID swab negative.  Urinalysis with trace leukocyte.  ABG compensated with hypoxemia.  Chest x-ray revealed diffuse reticulonodular interstitial disease throughout the lungs, which could reflect chronic lung disease or atypical infection.  EKG showed sinus tachycardia 108 bpm, global ST depressions.  She was subsequently placed on BiPAP for hypoxia, transferred to [facility], ICU for further evaluation.   ? ?She was initiated on empiric antibiotic for community-acquired pneumonia.  Blood and urine culture pending.  Troponin further uptrending to 6513.  Echocardiogram completed today revealed EF 30 to 35%, mild LV hypertrophy of basal septal segment, akinetic inferior and posterior wall, hypokinetic anterolateral wall and entire apex, grade 3 DD, elevated LA pressure, RV normal, moderately elevated PASP with RVSP 80mHg, mildly dilated  LA, large pleural effusion in the left lateral region, severe MR, aortic sclerosis.  She was started on aspirin, metoprolol, and Lovenox.  She was diuresed with IV Lasix.  Cardiology is consulted today for further evaluation of non-STEMI. ? ?Upon encounter, patient is sitting in bed, states she feels better. She recalls having sudden onset of air hunger last night when laying down. She had some mild nausea and coughed up some mucus. She states 2-3 weeks ago she had an episode of left upper back ache, that went away with Pepto-bismol. She never had any chest pain, dizziness, syncope, weight gain, PND, edema. She had urinated a lot since lasix. She is newly dependent on oxygen at this time. She takes ASA '325mg'$  daily at home for arthritis. She denied any known cardiac disease in the past, daughter states she had mitral vale prolapse. She reports her mother had stroke and MI in the past. She denied tobacco use.   ? ? ?Past Medical History:  ?Diagnosis Date  ? Arthritis   ? Colon polyps   ? Diverticulitis   ? Hyperlipidemia   ? Hypertension   ? Hypothyroidism   ? PONV (postoperative nausea and vomiting)   ? Thyroid disease   ? UTI (urinary tract infection)   ? ? ?Past Surgical History:  ?Procedure Laterality Date  ? ABDOMINAL HYSTERECTOMY  1993  ? TAH, endometriosis  ? CHOLECYSTECTOMY  2002  ? TOTAL HIP ARTHROPLASTY Right 05/31/2013  ? Procedure: RIGHT TOTAL HIP ARTHROPLASTY ANTERIOR APPROACH;  Surgeon: FGearlean Alf MD;  Location: [facility] ORS;  Service: Orthopedics;  Laterality: Right;  ?  ? ?Home Medications:  ?Prior to Admission medications   ?Medication  Sig Start Date End Date Taking? Authorizing Provider  ?amLODipine (NORVASC) 5 MG tablet TAKE 1 TABLET BY MOUTH EVERY DAY ?Patient taking differently: Take 5 mg by mouth daily. 01/15/22  Yes Burchette, Alinda Sierras, MD  ?betamethasone dipropionate 0.05 % cream Apply to affected rash two times daily no longer than 2 weeks of continuous rash. ?Patient taking differently: Apply 1  application. topically 2 (two) times daily. Apply to right hand 01/20/22  Yes Burchette, Alinda Sierras, MD  ?bismuth subsalicylate (PEPTO BISMOL) 262 MG/15ML suspension Take 30 mLs by mouth every 6 (six) hours as needed for indigestion or diarrhea or loose stools.   Yes [provider]  ?fluticasone (FLONASE) 50 MCG/ACT nasal spray Place 1-2 sprays into both nostrils daily as needed for allergies or rhinitis.   Yes [provider]  ?ibuprofen (ADVIL) 200 MG tablet Take 400 mg by mouth every 6 (six) hours as needed for headache or mild pain.   Yes [provider]  ?levothyroxine (SYNTHROID) 25 MCG tablet TAKE 1 TABLET BY MOUTH EVERY DAY ?Patient taking differently: Take 25 mcg by mouth daily before breakfast. 12/10/21  Yes Burchette, Alinda Sierras, MD  ?ondansetron (ZOFRAN ODT) 4 MG disintegrating tablet Take 1 tablet (4 mg total) by mouth every 8 (eight) hours as needed for nausea or vomiting. 07/23/20  Yes Burchette, Alinda Sierras, MD  ?pantoprazole (PROTONIX) 40 MG tablet Take 40 mg by mouth daily. 08/06/20  Yes [provider]  ?trimethoprim (TRIMPEX) 100 MG tablet TAKE 1 TABLET BY MOUTH EVERY DAY ?Patient taking differently: Take 100 mg by mouth daily. 01/28/22  Yes Burchette, Alinda Sierras, MD  ?cephALEXin (KEFLEX) 500 MG capsule Take 1 capsule (500 mg total) by mouth 3 (three) times daily. ?Patient not taking: Reported on 02/03/2022 01/20/22   [name], MD  ?ciclopirox (LOPROX) 0.77 % cream Apply topically 2 (two) times daily. ?Patient not taking: Reported on 02/03/2022 12/23/21   [name], MD  ? ? ?Inpatient Medications: ?Scheduled Meds: ? [START ON 02/04/2022] aspirin EC  81 mg Oral Daily  ? [START ON 02/04/2022] azithromycin  250 mg Oral Daily  ? cefdinir  300 mg Oral Q12H  ? enoxaparin (LOVENOX) injection  1 mg/kg Subcutaneous Q12H  ? insulin aspart  0-15 Units Subcutaneous Q4H  ? levothyroxine  25 mcg Oral Q0600  ? metoprolol tartrate  25 mg Oral BID  ? pantoprazole  40 mg Oral Daily  ?  rosuvastatin  10 mg Oral Daily  ? trimethoprim  100 mg Oral Daily  ? ?Continuous Infusions: ? ?PRN Meds: ?docusate sodium, polyethylene glycol ? ?Allergies:    ?Allergies  ?Allergen Reactions  ? Botox [Onabotulinumtoxina]   ?  Was given too much botox and at night, felt like her throat was closing up.   ? Diclofenac Other (See Comments)  ?  Unknown reaction per pt and SO  ? Lisinopril Swelling  ? Simvastatin Other (See Comments)  ?  myalgias  ? Statins Other (See Comments)  ?  myalgias  ? Sulfa Antibiotics Itching  ? ? ?Social History:   ?Social History  ? ?Socioeconomic History  ? Marital status: Married  ?  Spouse name: Juanda Crumble   ? Number of children: 2  ? Years of education: 35  ? Highest education level: Not on file  ?Occupational History  ? Not on file  ?Tobacco Use  ? Smoking status: Never  ? Smokeless tobacco: Never  ?Vaping Use  ? Vaping Use: Never used  ?Substance and Sexual Activity  ?  Alcohol use: No  ? Drug use: No  ? Sexual activity: Not Currently  ?Other Topics Concern  ? Not on file  ?Social History Narrative  ? 2 cups coffee per day  ? Soda daily (1/day)  ? Lives with husband  ? Right handed  ? ?Social Determinants of Health  ? ?Financial Resource Strain: Low Risk   ? Difficulty of Paying Living Expenses: Not hard at all  ?Food Insecurity: No Food Insecurity  ? Worried About Charity fundraiser in the Last Year: Never true  ? Ran Out of Food in the Last Year: Never true  ?Transportation Needs: No Transportation Needs  ? Lack of Transportation (Medical): No  ? Lack of Transportation (Non-Medical): No  ?Physical Activity: Insufficiently Active  ? Days of Exercise per Week: 5 days  ? Minutes of Exercise per Session: 10 min  ?Stress: No Stress Concern Present  ? Feeling of Stress : Not at all  ?Social Connections: Socially Integrated  ? Frequency of Communication with Friends and Family: Twice a week  ? Frequency of Social Gatherings with Friends and Family: Twice a week  ? Attends Religious Services:  More than 4 times per year  ? Active Member of Clubs or Organizations: Yes  ? Attends Archivist Meetings: More than 4 times per year  ? Marital Status: Married  ?Intimate Partner Violence: Not

## 2022-02-03 NOTE — Progress Notes (Signed)
Date and time results received: 02/03/22 0650 ? ? ?Test: Lactic Acid  ?Critical Value: 2.3 ? ?Name of Provider Notified: Sherrilyn Rist, MD ? ?Orders Received? Or Actions Taken?: MD notified, continue to monitor. ?

## 2022-02-03 NOTE — Progress Notes (Signed)
ANTICOAGULATION CONSULT NOTE - Initial Consult ? ?Pharmacy Consult for Heparin >> Lovenox  ?Indication: chest pain/ACS ? ?Allergies  ?Allergen Reactions  ? Botox [Onabotulinumtoxina]   ?  Was given too much botox and at night, felt like her throat was closing up.   ? Diclofenac Other (See Comments)  ?  Unknown reaction per pt and SO  ? Lisinopril Swelling  ? Simvastatin Other (See Comments)  ?  myalgias  ? Statins Other (See Comments)  ?  myalgias  ? Sulfa Antibiotics Itching  ? ? ?Patient Measurements: ?Height: '5\' 4"'$  (162.6 cm) ?Weight: 59.9 kg (132 lb 0.9 oz) ?IBW/kg (Calculated) : 54.7 ?Heparin Dosing Weight: 60 kg ? ?Vital Signs: ?Temp: 98.6 ?F (37 ?C) (04/04 7124) ?Temp Source: Oral (04/04 5809) ?BP: 100/62 (04/04 0800) ?Pulse Rate: 104 (04/04 0800) ? ?Labs: ?Recent Labs  ?  02/02/22 ?2321 02/03/22 ?0210 02/03/22 ?9833 02/03/22 ?0601  ?HGB 11.9*  --  13.3 12.7  ?HCT 36.6  --  39.0 39.6  ?PLT 395  --   --  402*  ?APTT 28  --   --   --   ?LABPROT 12.2  --   --   --   ?INR 0.9  --   --   --   ?CREATININE 0.73  --   --  0.85  ?TROPONINIHS 708* 1,454*  --   --   ? ? ? ?Estimated Creatinine Clearance: 48.6 mL/min (by C-G formula based on SCr of 0.85 mg/dL). ? ? ?Medical History: ?Past Medical History:  ?Diagnosis Date  ? Arthritis   ? Colon polyps   ? Diverticulitis   ? Hyperlipidemia   ? Hypertension   ? Hypothyroidism   ? PONV (postoperative nausea and vomiting)   ? Thyroid disease   ? UTI (urinary tract infection)   ? ? ?Medications:  ?ASA 325 mg  ? ?Assessment: ?On heparin for ACS. No AC PTA. Hgb WNL and PLTs slightly elevated.  ? ?Goal of Therapy:  ?Anti-Xa level 0.6-1 units/ml at steady state  ?Monitor platelets by anticoagulation protocol: Yes ?  ?Plan:  ?Switching from IV heparin to lovenox 1 mg/kg subcutaneous Q12h ?Monitor CBC tomorrow then as indicated  ?Monitor for signs and symptoms of bleeding  ? ?Eliseo Gum, PharmD Candidate  ?02/03/2022 9:25 AM  ? ? ?

## 2022-02-03 NOTE — Progress Notes (Signed)
Critical care attending progress note.  ? ?76 year old woman who presented with anginal-equivalent dyspnea and NSTEMI with inferior hypokinesis and moderately reduced LV function. Lateral hypokinesis consistent with multivessel coronary disease. Severe MR on echo.  ? ?Cardiac catheterization showed 3VD. Fick cardiac index 1.93. Elevated filling pressures.  ? ?Increasing oxygen requirements during case with sats in low 80's and increasing distress.  ? ?Intubated without difficulty and started on fentanyl infusion for comfort.  ? ?Started on norepinephrine to keep MAP > 65. IABP with 1:1 augmentation.  ?Started on furosemide infusion to diurese to near normal filling pressures if possible prior to surgery - Dr Cyndia Bent contacted.  ? ?Follow ScvO2 if remains low (initial was 47%) on mechanical ventilation, NE and IABP, would add milrinone.  ? ?Continue IV heparin, ASA, statin.  ? ?CRITICAL CARE ?Performed by: Kipp Brood ? ? ?Total critical care time: 90 minutes ? ?Critical care time was exclusive of separately billable procedures and treating other patients. ? ?Critical care was necessary to treat or prevent imminent or life-threatening deterioration. ? ?Critical care was time spent personally by me on the following activities: development of treatment plan with patient and/or surrogate as well as nursing, discussions with consultants, evaluation of patient's response to treatment, examination of patient, obtaining history from patient or surrogate, ordering and performing treatments and interventions, ordering and review of laboratory studies, ordering and review of radiographic studies, pulse oximetry, re-evaluation of patient's condition and participation in multidisciplinary rounds. ? ?Kipp Brood, MD FR[facility] ?ICU Physician ?[place]  ?Pager: [phone] ?Mobile: [phone] ?After hours: [phone]. ? ? ?

## 2022-02-03 NOTE — Progress Notes (Signed)
Pt transported by RT and Cath Lab to 2V12 w/ no complications ?

## 2022-02-03 NOTE — Progress Notes (Signed)
Heart Failure Navigator Progress Note ? ?Following this hospitalization to assess for HV TOC readiness.  ? ?EF 30% ? ?Earnestine Leys, BSN, RN ?Heart Failure Nurse Navigator ?[phone]  ?

## 2022-02-03 NOTE — ED Provider Notes (Signed)
?[facility] [facility] EMERGENCY DEPT ?Provider Note ? ? ?CSN: [id number] ?Arrival date & time: 02/02/22  2304 ? ?  ? ?History ? ?Chief Complaint  ?Patient presents with  ? Shortness of Breath  ?Level 5 caveat due to acuity of condition ? ?Tonya Hamilton is a 76 y.o. female. ? ?The history is provided by the patient and the spouse.  ?Shortness of Breath ?Severity:  Severe ?Onset quality:  Sudden ?Duration:  11 hours ?Timing:  Constant ?Progression:  Worsening ?Chronicity:  New ?Relieved by:  Rest ?Worsened by:  Activity ?Associated symptoms: cough   ?Associated symptoms: no abdominal pain, no chest pain and no fever   ?Presents with cough and shortness of breath.  Per patient and husband, she has been having mild shortness of breath for the past week.  Approximately 11 hours ago she began having increasing shortness of breath that is worse with exertion and lying flat.  No known fevers, no chest pain.  No back pain. ?No history of chronic lung disease. ?  ? ?Home Medications ?Prior to Admission medications   ?Medication Sig Start Date End Date Taking? Authorizing Provider  ?amLODipine (NORVASC) 5 MG tablet TAKE 1 TABLET BY MOUTH EVERY DAY 01/15/22   Burchette, Alinda Sierras, MD  ?aspirin-acetaminophen-caffeine (EXCEDRIN MIGRAINE) [id number] MG tablet Take 2 tablets by mouth every 6 (six) hours as needed for headache.    [provider]  ?betamethasone dipropionate 0.05 % cream Apply to affected rash two times daily no longer than 2 weeks of continuous rash. 01/20/22   Burchette, Alinda Sierras, MD  ?bismuth subsalicylate (PEPTO BISMOL) 262 MG/15ML suspension Take 30 mLs by mouth every 6 (six) hours as needed for indigestion or diarrhea or loose stools.    [provider]  ?cephALEXin (KEFLEX) 500 MG capsule Take 1 capsule (500 mg total) by mouth 3 (three) times daily. 01/20/22   Burchette, Alinda Sierras, MD  ?ciclopirox (LOPROX) 0.77 % cream Apply topically 2 (two) times daily. 12/23/21   Burchette, Alinda Sierras, MD   ?diphenhydramine-acetaminophen (TYLENOL PM) 25-500 MG TABS tablet Take 2 tablets by mouth at bedtime as needed (sleep/pain).    [provider]  ?fluticasone (FLONASE) 50 MCG/ACT nasal spray Place 1-2 sprays into both nostrils daily as needed for allergies or rhinitis.    [provider]  ?ibuprofen (ADVIL) 200 MG tablet Take 400 mg by mouth every 6 (six) hours as needed for headache or mild pain.    [provider]  ?levothyroxine (SYNTHROID) 25 MCG tablet TAKE 1 TABLET BY MOUTH EVERY DAY 12/10/21   Burchette, Alinda Sierras, MD  ?ondansetron (ZOFRAN ODT) 4 MG disintegrating tablet Take 1 tablet (4 mg total) by mouth every 8 (eight) hours as needed for nausea or vomiting. 07/23/20   Burchette, Alinda Sierras, MD  ?ondansetron (ZOFRAN) 4 MG tablet TAKE 1 TABLET BY MOUTH EVERY 8 HOURS AS NEEDED FOR NAUSEA AND VOMITING 07/23/20   Billie Ruddy, MD  ?pantoprazole (PROTONIX) 40 MG tablet Take 40 mg by mouth daily. 08/06/20   [provider]  ?trimethoprim (TRIMPEX) 100 MG tablet TAKE 1 TABLET BY MOUTH EVERY DAY 01/28/22   Burchette, Alinda Sierras, MD  ?   ? ?Allergies    ?Botox [onabotulinumtoxina], Diclofenac, Lisinopril, Simvastatin, Statins, and Sulfa antibiotics   ? ?Review of Systems   ?Review of Systems  ?Unable to perform ROS: Acuity of condition  ?Constitutional:  Positive for fatigue. Negative for fever.  ?Respiratory:  Positive for cough and shortness of breath.   ?Cardiovascular:  Negative for chest pain.  ?Gastrointestinal:  Negative for abdominal pain.  ? ?Physical Exam ?Updated Vital Signs ?BP 113/72 (BP Location: Right Arm)   Pulse (!) 130   Temp 100.1 ?F (37.8 ?C) (Rectal)   Resp (!) 25   SpO2 (!) 86%  ?Physical Exam ?CONSTITUTIONAL: Elderly, ill-appearing ?HEAD: Normocephalic/atraumatic ?EYES: EOMI/PERRL ?ENMT: Mucous membranes moist ?NECK: supple no meningeal signs ?SPINE/BACK:entire spine nontender ?CV: S1/S2 tachycardic ?LUNGS: Tachypnea, crackles bilaterally, requiring supplemental  oxygen ?ABDOMEN: soft, nontender, no rebound or guarding, bowel sounds noted throughout abdomen ?GU:no cva tenderness ?NEURO: Pt is awake/alert/appropriate, moves all extremitiesx4.  No facial droop.   ?EXTREMITIES: pulses normal/equal, full ROM, no lower extremity edema ?SKIN: warm, color normal ?PSYCH: Mildly anxious ?ED Results / Procedures / Treatments   ?Labs ?(all labs ordered are listed, but only abnormal results are displayed) ?Labs Reviewed  ?BRAIN NATRIURETIC PEPTIDE - Abnormal; Notable for the following components:  ?    Result Value  ? B Natriuretic Peptide 366.4 (*)   ? All other components within normal limits  ?BASIC METABOLIC PANEL - Abnormal; Notable for the following components:  ? Glucose, Bld 178 (*)   ? Calcium 8.8 (*)   ? All other components within normal limits  ?CBC WITH DIFFERENTIAL/PLATELET - Abnormal; Notable for the following components:  ? WBC 23.6 (*)   ? Hemoglobin 11.9 (*)   ? Neutro Abs 21.0 (*)   ? Monocytes Absolute 1.4 (*)   ? Abs Immature Granulocytes 0.14 (*)   ? All other components within normal limits  ?TROPONIN I (HIGH SENSITIVITY) - Abnormal; Notable for the following components:  ? Troponin I (High Sensitivity) 708 (*)   ? All other components within normal limits  ?TROPONIN I (HIGH SENSITIVITY) - Abnormal; Notable for the following components:  ? Troponin I (High Sensitivity) 1,454 (*)   ? All other components within normal limits  ?RESP PANEL BY RT-PCR (FLU A&B, COVID) [id number]  ?CULTURE, BLOOD (ROUTINE X 2)  ?CULTURE, BLOOD (ROUTINE X 2)  ?URINE CULTURE  ?LACTIC ACID, PLASMA  ?PROTIME-INR  ?APTT  ?LACTIC ACID, PLASMA  ?URINALYSIS, ROUTINE W REFLEX MICROSCOPIC  ? ? ?EKG ?EKG Interpretation ? ?Date/Time:  Monday February 02 2022 23:39:46 EDT ?Ventricular Rate:  106 ?PR Interval:  185 ?QRS Duration: 95 ?QT Interval:  357 ?QTC Calculation: 475 ?R Axis:   70 ?Text Interpretation: Sinus tachycardia Probable left atrial enlargement Repol abnrm, severe global ischemia (LM/MVD)  Abnormal ekg Interpretation limited secondary to artifact Confirmed by Ripley Fraise [phone]) on 02/02/2022 11:53:13 PM ? ? ? ? EKG Interpretation ? ?Date/Time:  Tuesday February 03 2022 02:51:17 EDT ?Ventricular Rate:  108 ?PR Interval:  182 ?QRS Duration: 105 ?QT Interval:  349 ?QTC Calculation: 468 ?R Axis:   79 ?Text Interpretation: Sinus tachycardia Probable left atrial enlargement Inferior infarct, old Repol abnrm, severe global ischemia (LM/MVD) Confirmed by Ripley Fraise [phone]) on 02/03/2022 2:54:54 AM ?  ? ?  ? ? ?Radiology ?DG Chest Port 1 View ? ?Result Date: 02/02/2022 ?CLINICAL DATA:  Shortness of breath EXAM: PORTABLE CHEST 1 VIEW COMPARISON:  05/25/2013 FINDINGS: Heart is normal size. Diffuse reticulonodular interstitial disease throughout the lungs, new since prior study. No effusions. Aortic atherosclerosis. No acute bony abnormality. IMPRESSION: Diffuse reticulonodular interstitial disease throughout the lungs, which could reflect chronic lung disease or atypical infection. Electronically Signed   By: Rolm Baptise M.D.   On: 02/02/2022 23:38   ? ?Procedures ?Marland KitchenCritical Care ?Performed by: Ripley Fraise, MD ?Authorized by: Ripley Fraise, MD  ? ?  Critical care provider statement:  ?  Critical care time (minutes):  120 ?  Critical care start time:  02/03/2022 12:10 PM ?  Critical care end time:  02/03/2022 2:10 AM ?  Critical care time was exclusive of:  Separately billable procedures and treating other patients ?  Critical care was necessary to treat or prevent imminent or life-threatening deterioration of the following conditions:  Respiratory failure and sepsis ?  Critical care was time spent personally by me on the following activities:  Development of treatment plan with patient or surrogate, evaluation of patient's response to treatment, examination of patient, pulse oximetry, ordering and review of radiographic studies, ordering and review of laboratory studies, re-evaluation of patient's  condition, ordering and performing treatments and interventions, obtaining history from patient or surrogate, ventilator management, discussions with consultants and review of old charts ?  I assumed direction of critical

## 2022-02-03 NOTE — H&P (View-Only) (Signed)
?Cardiology Consultation:  ? ?Patient ID: Tonya Hamilton ?MRN: [id number]; DOB: 1945-11-13 ? ?Admit date: 02/02/2022 ?Date of Consult: 02/03/2022 ? ?PCP:  [name], MD ?  ?[facility] Providers ?Cardiologist: New to Dr. Gwenlyn Found ? ? ? ?Patient Profile:  ? ?Tonya Hamilton is a 76 y.o. female with a PMH of hypertension, hyperlipidemia, hypothyroidism, diverticulitis, arthritis, GERD, chronic/frequent UTI prophylactic Bactrim, who is being seen 02/03/2022 for the evaluation of non-STEMI at the request of Dr. Oletta Darter. ? ?History of Present Illness:  ? ?[name] with above past medical history presented to the [facility] ER last night with chief complaints of shortness of breath, cough, and feeling unwell.  Per records review, patient began feeling short of breath suddenly, had some productive cough, denied any fever or chills, chest pain, N/V/D.  She was found to be hypoxic on room air with pulse ox 88% at ED. Initial diagnostic revealed elevated BNP 366, elevated high sensitive troponin 708 > 1454.  Lactic acid WNL.  CBC revealed leukocytosis with WBC 23.6 K.  INR 0.9.  Flu and COVID swab negative.  Urinalysis with trace leukocyte.  ABG compensated with hypoxemia.  Chest x-ray revealed diffuse reticulonodular interstitial disease throughout the lungs, which could reflect chronic lung disease or atypical infection.  EKG showed sinus tachycardia 108 bpm, global ST depressions.  She was subsequently placed on BiPAP for hypoxia, transferred to [facility], ICU for further evaluation.   ? ?She was initiated on empiric antibiotic for community-acquired pneumonia.  Blood and urine culture pending.  Troponin further uptrending to 6513.  Echocardiogram completed today revealed EF 30 to 35%, mild LV hypertrophy of basal septal segment, akinetic inferior and posterior wall, hypokinetic anterolateral wall and entire apex, grade 3 DD, elevated LA pressure, RV normal, moderately elevated PASP with RVSP 18mHg, mildly dilated  LA, large pleural effusion in the left lateral region, severe MR, aortic sclerosis.  She was started on aspirin, metoprolol, and Lovenox.  She was diuresed with IV Lasix.  Cardiology is consulted today for further evaluation of non-STEMI. ? ?Upon encounter, patient is sitting in bed, states she feels better. She recalls having sudden onset of air hunger last night when laying down. She had some mild nausea and coughed up some mucus. She states 2-3 weeks ago she had an episode of left upper back ache, that went away with Pepto-bismol. She never had any chest pain, dizziness, syncope, weight gain, PND, edema. She had urinated a lot since lasix. She is newly dependent on oxygen at this time. She takes ASA '325mg'$  daily at home for arthritis. She denied any known cardiac disease in the past, daughter states she had mitral vale prolapse. She reports her mother had stroke and MI in the past. She denied tobacco use.   ? ? ?Past Medical History:  ?Diagnosis Date  ? Arthritis   ? Colon polyps   ? Diverticulitis   ? Hyperlipidemia   ? Hypertension   ? Hypothyroidism   ? PONV (postoperative nausea and vomiting)   ? Thyroid disease   ? UTI (urinary tract infection)   ? ? ?Past Surgical History:  ?Procedure Laterality Date  ? ABDOMINAL HYSTERECTOMY  1993  ? TAH, endometriosis  ? CHOLECYSTECTOMY  2002  ? TOTAL HIP ARTHROPLASTY Right 05/31/2013  ? Procedure: RIGHT TOTAL HIP ARTHROPLASTY ANTERIOR APPROACH;  Surgeon: FGearlean Alf MD;  Location: [facility];  Service: Orthopedics;  Laterality: Right;  ?  ? ?Home Medications:  ?Prior to Admission medications   ?Medication  Sig Start Date End Date Taking? Authorizing Provider  ?amLODipine (NORVASC) 5 MG tablet TAKE 1 TABLET BY MOUTH EVERY DAY ?Patient taking differently: Take 5 mg by mouth daily. 01/15/22  Yes Burchette, Alinda Sierras, MD  ?betamethasone dipropionate 0.05 % cream Apply to affected rash two times daily no longer than 2 weeks of continuous rash. ?Patient taking differently: Apply 1  application. topically 2 (two) times daily. Apply to right hand 01/20/22  Yes Burchette, Alinda Sierras, MD  ?bismuth subsalicylate (PEPTO BISMOL) 262 MG/15ML suspension Take 30 mLs by mouth every 6 (six) hours as needed for indigestion or diarrhea or loose stools.   Yes [provider]  ?fluticasone (FLONASE) 50 MCG/ACT nasal spray Place 1-2 sprays into both nostrils daily as needed for allergies or rhinitis.   Yes [provider]  ?ibuprofen (ADVIL) 200 MG tablet Take 400 mg by mouth every 6 (six) hours as needed for headache or mild pain.   Yes [provider]  ?levothyroxine (SYNTHROID) 25 MCG tablet TAKE 1 TABLET BY MOUTH EVERY DAY ?Patient taking differently: Take 25 mcg by mouth daily before breakfast. 12/10/21  Yes Burchette, Alinda Sierras, MD  ?ondansetron (ZOFRAN ODT) 4 MG disintegrating tablet Take 1 tablet (4 mg total) by mouth every 8 (eight) hours as needed for nausea or vomiting. 07/23/20  Yes Burchette, Alinda Sierras, MD  ?pantoprazole (PROTONIX) 40 MG tablet Take 40 mg by mouth daily. 08/06/20  Yes [provider]  ?trimethoprim (TRIMPEX) 100 MG tablet TAKE 1 TABLET BY MOUTH EVERY DAY ?Patient taking differently: Take 100 mg by mouth daily. 01/28/22  Yes Burchette, Alinda Sierras, MD  ?cephALEXin (KEFLEX) 500 MG capsule Take 1 capsule (500 mg total) by mouth 3 (three) times daily. ?Patient not taking: Reported on 02/03/2022 01/20/22   [name], MD  ?ciclopirox (LOPROX) 0.77 % cream Apply topically 2 (two) times daily. ?Patient not taking: Reported on 02/03/2022 12/23/21   [name], MD  ? ? ?Inpatient Medications: ?Scheduled Meds: ? [START ON 02/04/2022] aspirin EC  81 mg Oral Daily  ? [START ON 02/04/2022] azithromycin  250 mg Oral Daily  ? cefdinir  300 mg Oral Q12H  ? enoxaparin (LOVENOX) injection  1 mg/kg Subcutaneous Q12H  ? insulin aspart  0-15 Units Subcutaneous Q4H  ? levothyroxine  25 mcg Oral Q0600  ? metoprolol tartrate  25 mg Oral BID  ? pantoprazole  40 mg Oral Daily  ?  rosuvastatin  10 mg Oral Daily  ? trimethoprim  100 mg Oral Daily  ? ?Continuous Infusions: ? ?PRN Meds: ?docusate sodium, polyethylene glycol ? ?Allergies:    ?Allergies  ?Allergen Reactions  ? Botox [Onabotulinumtoxina]   ?  Was given too much botox and at night, felt like her throat was closing up.   ? Diclofenac Other (See Comments)  ?  Unknown reaction per pt and SO  ? Lisinopril Swelling  ? Simvastatin Other (See Comments)  ?  myalgias  ? Statins Other (See Comments)  ?  myalgias  ? Sulfa Antibiotics Itching  ? ? ?Social History:   ?Social History  ? ?Socioeconomic History  ? Marital status: Married  ?  Spouse name: Juanda Crumble   ? Number of children: 2  ? Years of education: 56  ? Highest education level: Not on file  ?Occupational History  ? Not on file  ?Tobacco Use  ? Smoking status: Never  ? Smokeless tobacco: Never  ?Vaping Use  ? Vaping Use: Never used  ?Substance and Sexual Activity  ?  Alcohol use: No  ? Drug use: No  ? Sexual activity: Not Currently  ?Other Topics Concern  ? Not on file  ?Social History Narrative  ? 2 cups coffee per day  ? Soda daily (1/day)  ? Lives with husband  ? Right handed  ? ?Social Determinants of Health  ? ?Financial Resource Strain: Low Risk   ? Difficulty of Paying Living Expenses: Not hard at all  ?Food Insecurity: No Food Insecurity  ? Worried About Charity fundraiser in the Last Year: Never true  ? Ran Out of Food in the Last Year: Never true  ?Transportation Needs: No Transportation Needs  ? Lack of Transportation (Medical): No  ? Lack of Transportation (Non-Medical): No  ?Physical Activity: Insufficiently Active  ? Days of Exercise per Week: 5 days  ? Minutes of Exercise per Session: 10 min  ?Stress: No Stress Concern Present  ? Feeling of Stress : Not at all  ?Social Connections: Socially Integrated  ? Frequency of Communication with Friends and Family: Twice a week  ? Frequency of Social Gatherings with Friends and Family: Twice a week  ? Attends Religious Services:  More than 4 times per year  ? Active Member of Clubs or Organizations: Yes  ? Attends Archivist Meetings: More than 4 times per year  ? Marital Status: Married  ?Intimate Partner Violence: Not

## 2022-02-03 NOTE — Progress Notes (Addendum)
RN report CI dropped to 1.7, leveophed was at 56mg/kg/min ?BP was stable at 120s, MAP >65 ? ?We decided to wait and follow instead of adding Milrinone.  ?Patient became awake a few mins later and her BP improved, now 140/74 with MAPs close to 100s.  ?CVP is 5. ?She is on lasix gtt ?IABP 1:1 ?Groin looks good, no hematoma but DPA unable but Dopplerable on PT, leg is cold on the right side, left side is ok. ? ?Levophed turned off. Will keep lasix gtt to keep CVP around 5 ?PA numbers now are 28/19 (mean 22) ?If her BP remains high then will initiate low dose Isordil + hydral ? ? ?-> as reported earlier from cath lab- will initiate aggrastat + IV heparin for IABP ? ?Addendum: CI dropped to 1.4 a little after, decided to start Inotrope ? - begin Milrinone at 0.1253m/kg/min ? -f/w CO/CI and PA #. ? ?

## 2022-02-03 NOTE — Sepsis Progress Note (Signed)
Sepsis bundle has been met. VSS. Lactic acid 1.6. Blood cultures and antibiotics have been done. Patient is now located in the ICU. Will close sepsis and follow as an ICU patient.  ?

## 2022-02-03 NOTE — Sepsis Progress Note (Signed)
Following for sepsis monitoring ?

## 2022-02-03 NOTE — Procedures (Signed)
Intubation Procedure Note ? ?Tonya Hamilton  ?[id number]  ?September 13, 1946 ? ?Date:02/03/22  ?Time:7:27 PM  ? ?Provider Performing:Wilene Pharo  ? ? ?Procedure: Intubation (23343) ? ?Indication(s) ?Respiratory Failure ? ?Consent ?Unable to obtain consent due to emergent nature of procedure. ? ? ?Anesthesia ?Etomidate, Versed, Fentanyl, and Rocuronium ? ? ?Time Out ?Verified patient identification, verified procedure, site/side was marked, verified correct patient position, special equipment/implants available, medications/allergies/relevant history reviewed, required imaging and test results available. ? ? ?Sterile Technique ?Usual hand hygeine, masks, and gloves were used ? ? ?Procedure Description ?Patient positioned in bed supine.  Sedation given as noted above.  Patient was intubated with endotracheal tube using  MAC 3 direct laryngoscopy .  View was Grade 1 full glottis .  Number of attempts was 1.  Colorimetric CO2 detector was consistent with tracheal placement. Tube secured at 19cm ? ? ?Complications/Tolerance ?None; patient tolerated the procedure well. ?Tube position verified by fluoroscopy in the cath lab ? ? ?EBL ?none ? ? ?Specimen(s) ?None  ? ?Tonya Brood, MD [facility] ?ICU Physician ?[place]  ?Pager: [phone] ?Or Epic Secure Chat ?After hours: [phone]. ? ?02/03/2022, 7:28 PM ? ? ? ? ?

## 2022-02-03 NOTE — Progress Notes (Signed)
? ?NAME:  Tonya Hamilton, MRN:  [id number], DOB:  11/16/1945, LOS: 0 ?ADMISSION DATE:  02/02/2022, CONSULTATION DATE:  02/03/22 ?REFERRING MD:  Christy Gentles CHIEF COMPLAINT:  Dyspnea  ? ?History of Present Illness:  ?Tonya Hamilton is a 76 y.o. female who has a PMH as outlined below.  She presented to [facility] ED 4/3 with dyspnea.  She states that symptoms began suddenly and progressed.  She also endorsed cough intimately productive of sputum.  No fevers/chills/sweats, chest pain, N/V/D, abdominal pain, myalgias, exposures known sick contacts, recent travel. ? ?In ED, she was found to be hypoxic.  CXR showed bilateral opacities and bilateral mild edema.  Troponin was elevated at 708 with repeat up to 1454.  BNP mildly elevated at 366.  EKG with ST depressions and global ischemia.  Leukocytosis to 23.6.  She was initially placed on Ventimask but was later upgraded to BiPAP.  She continued to require BiPAP which she did feel subjective improvement in symptoms.  Patient stable subsequently called for transfer to ICU at [facility] for further evaluation management. ? ?Pertinent  Medical History:  ?has History of colonic diverticulitis; Osteoarthritis; Hypertension; Hyperlipidemia; Hypothyroid; GERD (gastroesophageal reflux disease); Osteopenia; Anxiety; Osteoarthritis of right hip; Angioedema; Chronic intractable headache; Occipital neuralgia of left side; Neck pain; Hyperreflexia; Arthritis, multiple joint involvement; Spasmodic dysphonia; Acute respiratory failure ([place]); Respiratory failure ([place]); Community acquired pneumonia; and Non-STEMI (non-ST elevated myocardial infarction) ([place]) on their problem list. ? ? ?Significant Hospital Events: ?Including procedures, antibiotic start and stop dates in addition to other pertinent events   ?4/4 admit, started on Cefepime and Azith and Bipap ? ?Interim History / Subjective:  ?Feeling better this morning, off Bipap, no chest pain or pressure ? ?Objective:  ?Blood pressure  97/65, pulse (!) 101, temperature (!) 97.4 ?F (36.3 ?C), temperature source Axillary, resp. rate (!) 21, height '5\' 4"'$  (1.626 m), weight 59.9 kg, SpO2 95 %. ?   ?Vent Mode: BIPAP ?FiO2 (%):  [55 %-60 %] 60 % ?PEEP:  [5 cmH20] 5 cmH20 ?Pressure Support:  [8 cmH20] 8 cmH20  ? ?Intake/Output Summary (Last 24 hours) at 02/03/2022 0757 ?Last data filed at 02/03/2022 0700 ?Gross per 24 hour  ?Intake 823.42 ml  ?Output 400 ml  ?Net 423.42 ml  ? ? ?Filed Weights  ? 02/03/22 0300 02/03/22 0600  ?Weight: 60 kg 59.9 kg  ? ? ?General:  well-nourished elderly F, ill-appearing but in no acute distress ?HEENT: MM pink/moist, sclera anicteric, ?Neuro: awake, alert, oriented and following commands  ?CV: s1s2 tachycardic, regular no m/r/g, + JVD ?PULM:  scattered bibasilar crackles and expiratory wheezing on [place], no distress ?GI: soft, bsx4 active  ?Extremities: warm/dry, no edema  ?Skin: no rashes or lesions ? ? ?Labs/imaging personally reviewed:  ?CXR 4/3 > bilateral opacities and edema. ? ?Assessment & Plan:  ? ?Acute hypoxic respiratory failure likely secondary to NSTEMI, mild pulmonary edema and possible CAP ?Able to stop bipap and tolerate [place] ?Symptomatically improved, reports increased exertional dyspnea for the last month ?-continue  to maintain O2 sats >90% ?-trend trop, continue Asa and heparin gtt ?-Lopressor '25mg'$  bid and start statin ?-Echo pening ?-stop IVF and give additional Lasix '40mg'$  ?-consult cardiology, appreciate assistance ?-continue empiric CAP coverage and follow cultures, add urine strep and legionella and RVP and procal ? ? ?Hx frequent/chronic UTIs - on daily Bactrim as prophylaxis. ?- Continue home Bactrim. ? ?Hx hypothyroidism. ?- Continue home Synthroid. ? ?Hx HTN, HLD. ?- Hold home Amlodipine. ? ?Best practice (evaluated daily):  ?  Diet/type: Regular consistency (see orders) ?DVT prophylaxis: systemic heparin ?GI prophylaxis: N/A ?Lines: N/A ?Foley:  Yes, and it is still needed ?Code Status:  full code ?Last  date of multidisciplinary goals of care discussion:  Pt and husband updated at the bedside 4/4 ? ?Labs   ?CBC: ?Recent Labs  ?Lab 02/02/22 ?2321 02/03/22 ?5053 02/03/22 ?0601  ?WBC 23.6*  --  26.2*  ?NEUTROABS 21.0*  --   --   ?HGB 11.9* 13.3 12.7  ?HCT 36.6 39.0 39.6  ?MCV 86.7  --  88.0  ?PLT 395  --  402*  ? ? ? ?Basic Metabolic Panel: ?Recent Labs  ?Lab 02/02/22 ?2321 02/03/22 ?9767 02/03/22 ?0601  ?NA 139 141 142  ?K 3.8 3.4* 3.7  ?CL 103  --  106  ?CO2 22  --  27  ?GLUCOSE 178*  --  178*  ?BUN 18  --  14  ?CREATININE 0.73  --  0.85  ?CALCIUM 8.8*  --  8.1*  ?MG  --   --  2.0  ?PHOS  --   --  3.0  ? ? ?GFR: ?Estimated Creatinine Clearance: 48.6 mL/min (by C-G formula based on SCr of 0.85 mg/dL). ?Recent Labs  ?Lab 02/02/22 ?2321 02/03/22 ?0601  ?WBC 23.6* 26.2*  ?LATICACIDVEN 1.6 2.3*  ? ? ? ?Liver Function Tests: ?No results for input(s): AST, ALT, ALKPHOS, BILITOT, PROT, ALBUMIN in the last 168 hours. ?No results for input(s): LIPASE, AMYLASE in the last 168 hours. ?No results for input(s): AMMONIA in the last 168 hours. ? ?ABG ?   ?Component Value Date/Time  ? PHART 7.383 02/03/2022 0440  ? PCO2ART 39.0 02/03/2022 0440  ? PO2ART 69 (L) 02/03/2022 0440  ? HCO3 23.4 02/03/2022 0440  ? TCO2 25 02/03/2022 0440  ? ACIDBASEDEF 2.0 02/03/2022 0440  ? O2SAT 94 02/03/2022 0440  ?  ? ?Coagulation Profile: ?Recent Labs  ?Lab 02/02/22 ?2321  ?INR 0.9  ? ? ? ?Cardiac Enzymes: ?No results for input(s): CKTOTAL, CKMB, CKMBINDEX, TROPONINI in the last 168 hours. ? ?HbA1C: ?No results found for: HGBA1C ? ?CBG: ?Recent Labs  ?Lab 02/03/22 ?0336  ?GLUCAP 213*  ? ? ?Review of Systems:   ?Please see the history of present illness. All other systems reviewed and are negative  ? ? ?Past Medical History:  ?She,  has a past medical history of Arthritis, Colon polyps, Diverticulitis, Hyperlipidemia, Hypertension, Hypothyroidism, PONV (postoperative nausea and vomiting), Thyroid disease, and UTI (urinary tract infection).  ? ?Surgical  History:  ? ?Past Surgical History:  ?Procedure Laterality Date  ? ABDOMINAL HYSTERECTOMY  1993  ? TAH, endometriosis  ? CHOLECYSTECTOMY  2002  ? TOTAL HIP ARTHROPLASTY Right 05/31/2013  ? Procedure: RIGHT TOTAL HIP ARTHROPLASTY ANTERIOR APPROACH;  Surgeon: Gearlean Alf, MD;  Location: [facility];  Service: Orthopedics;  Laterality: Right;  ?  ? ?Social History:  ? reports that she has never smoked. She has never used smokeless tobacco. She reports that she does not drink alcohol and does not use drugs.  ? ?Family History:  ?Her family history includes Arthritis in her mother; Heart Problems in her maternal grandfather; Heart disease in her father and mother; Hypertension in her father and mother; Stroke in her father. There is no history of Breast cancer.  ? ?Allergies ?Allergies  ?Allergen Reactions  ? Botox [Onabotulinumtoxina]   ?  Was given too much botox and at night, felt like her throat was closing up.   ? Diclofenac   ? Lisinopril Swelling  ?  Simvastatin   ? Statins   ?  Body aches all over  ? Sulfa Antibiotics Itching  ?  ? ?Home Medications  ?Prior to Admission medications   ?Medication Sig Start Date End Date Taking? Authorizing Provider  ?amLODipine (NORVASC) 5 MG tablet TAKE 1 TABLET BY MOUTH EVERY DAY 01/15/22   Burchette, Alinda Sierras, MD  ?aspirin-acetaminophen-caffeine (EXCEDRIN MIGRAINE) [phone] MG tablet Take 2 tablets by mouth every 6 (six) hours as needed for headache.    [provider]  ?betamethasone dipropionate 0.05 % cream Apply to affected rash two times daily no longer than 2 weeks of continuous rash. 01/20/22   Burchette, Alinda Sierras, MD  ?bismuth subsalicylate (PEPTO BISMOL) 262 MG/15ML suspension Take 30 mLs by mouth every 6 (six) hours as needed for indigestion or diarrhea or loose stools.    [provider]  ?cephALEXin (KEFLEX) 500 MG capsule Take 1 capsule (500 mg total) by mouth 3 (three) times daily. 01/20/22   Burchette, Alinda Sierras, MD  ?ciclopirox (LOPROX) 0.77 % cream  Apply topically 2 (two) times daily. 12/23/21   Burchette, Alinda Sierras, MD  ?diphenhydramine-acetaminophen (TYLENOL PM) 25-500 MG TABS tablet Take 2 tablets by mouth at bedtime as needed (sleep/pain).    Provide

## 2022-02-03 NOTE — Consult Note (Signed)
? ?   ?[address] ?      [place] [address] ?            [phone]   ? ?  ?Cardiothoracic Surgery Consultation ? ?Reason for Consult: Severe three-vessel coronary disease with severe mitral regurgitation and cardiogenic shock status post NSTEMI ? ?Referring Physician: Glenetta Hew, MD ? ?Tonya Hamilton is an 76 y.o. female.  ?HPI:  ? ?The patient is a 76 year old woman with a history of hypertension, hyperlipidemia, and hypothyroidism who presented to the [facility] ER last night with acute onset of shortness of breath, cough, and feeling poorly.  She was found to be hypoxic on room air with a sat of 88%.  Her BNP was 366 with an elevated at [facility] troponin of 708 increasing to 1454.  She had a leukocytosis of 23.6 K.  Flu and COVID test were negative.  Chest x-ray showed diffuse reticulonodular interstitial disease throughout the lungs which was felt to possibly be an atypical infection or chronic lung disease.  Electrocardiogram showed sinus tachycardia at 108 bpm with global ST depression.  She was put on BiPAP for hypoxia and transferred to [facility] for further evaluation.  She was started on empiric antibiotics for possible community-acquired pneumonia with sepsis.  Her troponin continued increasing to 6513.  Electrocardiogram done today showed ejection fraction of 30 to 35% with akinesis of the inferior and posterior walls.  There is hypokinesis of the anterolateral wall and entire apex.  There is grade 3 diastolic dysfunction with elevated left atrial pressure.  There is moderately elevated PASP and RVSP estimated at 50 mmHg.  There is severe mitral regurgitation.  There is a large left pleural effusion.  There is no evidence of aortic stenosis or insufficiency.  She was treated with intravenous diuresis with some improvement.  She reportedly has had some symptoms for 2 to 3 weeks that began with some upper back aching and then some cough followed by cute onset of shortness last  night when laying down. ? ?She was taken to the Cath Lab late this afternoon which showed left main and severe three-vessel coronary disease.  PA pressure was elevated at 43/22 with a mean of 32.  PCWP was elevated with a mean of 26 and a V wave of 33 mmHg.  LVEDP was elevated at 28.  Cardiac index was 1.93 suggesting some cardiogenic shock.  She had an intra-aortic balloon pump placed and was intubated due to acute hypoxic respiratory failure.  She was started on norepinephrine for blood pressure support.  I was called to the Cath Lab to evaluate her for consideration of surgery. ? ?Past Medical History:  ?Diagnosis Date  ? Arthritis   ? Colon polyps   ? Diverticulitis   ? Hyperlipidemia   ? Hypertension   ? Hypothyroidism   ? PONV (postoperative nausea and vomiting)   ? Thyroid disease   ? UTI (urinary tract infection)   ? ? ?Past Surgical History:  ?Procedure Laterality Date  ? ABDOMINAL HYSTERECTOMY  1993  ? TAH, endometriosis  ? CHOLECYSTECTOMY  2002  ? TOTAL HIP ARTHROPLASTY Right 05/31/2013  ? Procedure: RIGHT TOTAL HIP ARTHROPLASTY ANTERIOR APPROACH;  Surgeon: Gearlean Alf, MD;  Location: [facility];  Service: Orthopedics;  Laterality: Right;  ? ? ?Family History  ?Problem Relation Age of Onset  ? Arthritis Mother   ? Hypertension Mother   ? Heart disease Mother   ? Hypertension Father   ? Stroke  Father   ? Heart disease Father   ? Heart Problems Maternal Grandfather   ? Breast cancer Neg Hx   ? ? ?Social History:  reports that she has never smoked. She has never used smokeless tobacco. She reports that she does not drink alcohol and does not use drugs. ? ?Allergies:  ?Allergies  ?Allergen Reactions  ? Botox [Onabotulinumtoxina]   ?  Was given too much botox and at night, felt like her throat was closing up.   ? Diclofenac Other (See Comments)  ?  Unknown reaction per pt and SO  ? Lisinopril Swelling  ? Simvastatin Other (See Comments)  ?  myalgias  ? Statins Other (See Comments)  ?  myalgias  ? Sulfa  Antibiotics Itching  ? ? ?Medications: I have reviewed the patient's current medications. ?Prior to Admission:  ?Medications Prior to Admission  ?Medication Sig Dispense Refill Last Dose  ? amLODipine (NORVASC) 5 MG tablet TAKE 1 TABLET BY MOUTH EVERY DAY (Patient taking differently: Take 5 mg by mouth daily.) 30 tablet 2 Past Week  ? betamethasone dipropionate 0.05 % cream Apply to affected rash two times daily no longer than 2 weeks of continuous rash. (Patient taking differently: Apply 1 application. topically 2 (two) times daily. Apply to right hand) 30 g 1 Past Week  ? bismuth subsalicylate (PEPTO BISMOL) 262 MG/15ML suspension Take 30 mLs by mouth every 6 (six) hours as needed for indigestion or diarrhea or loose stools.   02/02/2022  ? fluticasone (FLONASE) 50 MCG/ACT nasal spray Place 1-2 sprays into both nostrils daily as needed for allergies or rhinitis.   Past Week  ? ibuprofen (ADVIL) 200 MG tablet Take 400 mg by mouth every 6 (six) hours as needed for headache or mild pain.   Past Week  ? levothyroxine (SYNTHROID) 25 MCG tablet TAKE 1 TABLET BY MOUTH EVERY DAY (Patient taking differently: Take 25 mcg by mouth daily before breakfast.) 30 tablet 0 Past Week  ? ondansetron (ZOFRAN ODT) 4 MG disintegrating tablet Take 1 tablet (4 mg total) by mouth every 8 (eight) hours as needed for nausea or vomiting. 10 tablet 0 Past Week  ? pantoprazole (PROTONIX) 40 MG tablet Take 40 mg by mouth daily.   Past Week  ? trimethoprim (TRIMPEX) 100 MG tablet TAKE 1 TABLET BY MOUTH EVERY DAY (Patient taking differently: Take 100 mg by mouth daily.) 90 tablet 1 Past Week  ? cephALEXin (KEFLEX) 500 MG capsule Take 1 capsule (500 mg total) by mouth 3 (three) times daily. (Patient not taking: Reported on 02/03/2022) 15 capsule 0 Completed Course  ? ciclopirox (LOPROX) 0.77 % cream Apply topically 2 (two) times daily. (Patient not taking: Reported on 02/03/2022) 15 g 1 Completed Course  ? ?Scheduled: ? [START ON 02/04/2022] aspirin  81  mg Per Tube Daily  ? [START ON 02/04/2022] azithromycin  250 mg Oral Daily  ? cefdinir  300 mg Per Tube BID  ? Chlorhexidine Gluconate Cloth  6 each Topical Daily  ? docusate  100 mg Per Tube BID  ? fentaNYL (SUBLIMAZE) injection  25 mcg Intravenous Once  ? furosemide  80 mg Intravenous Once  ? insulin aspart  0-15 Units Subcutaneous Q4H  ? levothyroxine  25 mcg Oral Q0600  ? metoprolol tartrate  25 mg Oral BID  ? [START ON 02/04/2022] pantoprazole (PROTONIX) IV  40 mg Intravenous Daily  ? polyethylene glycol  17 g Per Tube Daily  ? [START ON 02/04/2022] rosuvastatin  10 mg Per Tube  Daily  ? sodium chloride flush  3 mL Intravenous Q12H  ? sodium chloride flush  3 mL Intravenous Q12H  ? [START ON 02/04/2022] trimethoprim  100 mg Per Tube Daily  ? ?Continuous: ? sodium chloride    ? sodium chloride    ? fentaNYL infusion INTRAVENOUS 75 mcg/hr (02/03/22 1956)  ? furosemide (LASIX) 200 mg in dextrose 5% 100 mL ('2mg'$ /mL) infusion    ? norepinephrine (LEVOPHED) Adult infusion    ? ?TKZ:SWFUXN chloride, acetaminophen, albuterol, docusate sodium, fentaNYL, fentaNYL (SUBLIMAZE) injection, fentaNYL (SUBLIMAZE) injection, hydrALAZINE, midazolam, midazolam, ondansetron (ZOFRAN) IV, polyethylene glycol, sodium chloride flush ?Anti-infectives (From admission, onward)  ? ? Start     Dose/Rate Route Frequency Ordered Stop  ? 02/04/22 1000  azithromycin (ZITHROMAX) tablet 250 mg       ? 250 mg Oral Daily 02/03/22 1020 02/08/22 0959  ? 02/04/22 1000  trimethoprim (TRIMPEX) tablet 100 mg       ? 100 mg Per Tube Daily 02/03/22 2035    ? 02/03/22 2200  cefdinir (OMNICEF) capsule 300 mg  Status:  Discontinued       ? 300 mg Oral Every 12 hours 02/03/22 1020 02/03/22 2033  ? 02/03/22 2200  cefdinir (OMNICEF) 250 MG/5ML suspension 300 mg       ? 300 mg Per Tube 2 times daily 02/03/22 2033 02/07/22 0959  ? 02/03/22 1000  trimethoprim (TRIMPEX) tablet 100 mg  Status:  Discontinued       ? 100 mg Oral Daily 02/03/22 0410 02/03/22 2035  ? 02/03/22 0015   cefTRIAXone (ROCEPHIN) 2 g in sodium chloride 0.9 % 100 mL IVPB  Status:  Discontinued       ? 2 g ?200 mL/hr over 30 Minutes Intravenous Every 24 hours 02/03/22 0009 02/03/22 1020  ? 02/03/22 0015  azithrom

## 2022-02-03 NOTE — Progress Notes (Signed)
ANTICOAGULATION CONSULT NOTE - Initial Consult ? ?Pharmacy Consult for Heparin ?Indication: chest pain/ACS ? ?Allergies  ?Allergen Reactions  ? Botox [Onabotulinumtoxina]   ?  Was given too much botox and at night, felt like her throat was closing up.   ? Diclofenac   ? Lisinopril Swelling  ? Simvastatin   ? Statins   ?  Body aches all over  ? Sulfa Antibiotics Itching  ? ? ?Patient Measurements: ?Height: '5\' 4"'$  (162.6 cm) ?Weight: 60 kg (132 lb 4.4 oz) ?IBW/kg (Calculated) : 54.7 ?Heparin Dosing Weight: 60 kg ? ?Vital Signs: ?Temp: 97.2 ?F (36.2 ?C) (04/04 0230) ?Temp Source: Temporal (04/04 0230) ?BP: 118/65 (04/04 0245) ?Pulse Rate: 105 (04/04 0245) ? ?Labs: ?Recent Labs  ?  02/02/22 ?2321 02/03/22 ?0210  ?HGB 11.9*  --   ?HCT 36.6  --   ?PLT 395  --   ?APTT 28  --   ?LABPROT 12.2  --   ?INR 0.9  --   ?CREATININE 0.73  --   ?TROPONINIHS 708* 1,454*  ? ? ?Estimated Creatinine Clearance: 51.7 mL/min (by C-G formula based on SCr of 0.73 mg/dL). ? ? ?Medical History: ?Past Medical History:  ?Diagnosis Date  ? Arthritis   ? Colon polyps   ? Diverticulitis   ? Hyperlipidemia   ? Hypertension   ? Hypothyroidism   ? PONV (postoperative nausea and vomiting)   ? Thyroid disease   ? UTI (urinary tract infection)   ? ? ?Medications:  ?Awaiting electronic med rec ? ?Assessment: ?76 y.o. F presents with cough/SOB. Trop elevated to 1454. To begin heparin for ACS. No AC PTA. CBC ok on admission.  ? ?Goal of Therapy:  ?Heparin level 0.3-0.7 units/ml ?Monitor platelets by anticoagulation protocol: Yes ?  ?Plan:  ?Heparin IV bolus 3500 units ?Heparin gtt at 700 units/hr ?Will f/u heparin level in 8 hours ?Daily heparin level and CBC ? ?Sherlon Handing, PharmD, BCPS ?Please see amion for complete clinical pharmacist phone list ?02/03/2022,3:33 AM ? ? ?

## 2022-02-03 NOTE — Progress Notes (Signed)
?  Transition of Care (TOC) Screening Note ? ? ?Patient Details  ?Name: Tonya Hamilton ?Date of Birth: [birth year] ? ? ?Transition of Care (TOC) CM/SW Contact:    ?Milas Gain, LCSWA ?Phone Number: ?02/03/2022, 3:58 PM ? ? ? ?Transition of Care Department [facility]) has reviewed patient and no TOC needs have been identified at this time. We will continue to monitor patient advancement through interdisciplinary progression rounds. If new patient transition needs arise, please place a TOC consult. ?  ?

## 2022-02-04 ENCOUNTER — Encounter (HOSPITAL_COMMUNITY): Payer: Self-pay | Admitting: Cardiology

## 2022-02-04 ENCOUNTER — Other Ambulatory Visit: Payer: Self-pay | Admitting: Family Medicine

## 2022-02-04 ENCOUNTER — Inpatient Hospital Stay (HOSPITAL_COMMUNITY): Payer: Medicare HMO

## 2022-02-04 DIAGNOSIS — I2510 Atherosclerotic heart disease of native coronary artery without angina pectoris: Secondary | ICD-10-CM | POA: Diagnosis not present

## 2022-02-04 DIAGNOSIS — J9601 Acute respiratory failure with hypoxia: Secondary | ICD-10-CM | POA: Diagnosis not present

## 2022-02-04 DIAGNOSIS — R570 Cardiogenic shock: Secondary | ICD-10-CM | POA: Diagnosis not present

## 2022-02-04 LAB — URINE CULTURE: Culture: NO GROWTH

## 2022-02-04 LAB — COMPREHENSIVE METABOLIC PANEL
ALT: 40 U/L (ref 0–44)
ALT: 43 U/L (ref 0–44)
AST: 97 U/L — ABNORMAL HIGH (ref 15–41)
AST: 97 U/L — ABNORMAL HIGH (ref 15–41)
Albumin: 2.8 g/dL — ABNORMAL LOW (ref 3.5–5.0)
Albumin: 2.8 g/dL — ABNORMAL LOW (ref 3.5–5.0)
Alkaline Phosphatase: 56 U/L (ref 38–126)
Alkaline Phosphatase: 63 U/L (ref 38–126)
Anion gap: 11 (ref 5–15)
Anion gap: 12 (ref 5–15)
BUN: 20 mg/dL (ref 8–23)
BUN: 19 mg/dL (ref 8–23)
CO2: 23 mmol/L (ref 22–32)
CO2: 24 mmol/L (ref 22–32)
Calcium: 8.6 mg/dL — ABNORMAL LOW (ref 8.9–10.3)
Calcium: 8.2 mg/dL — ABNORMAL LOW (ref 8.9–10.3)
Chloride: 106 mmol/L (ref 98–111)
Chloride: 108 mmol/L (ref 98–111)
Creatinine, Ser: 1.25 mg/dL — ABNORMAL HIGH (ref 0.44–1.00)
Creatinine, Ser: 1.2 mg/dL — ABNORMAL HIGH (ref 0.44–1.00)
GFR, Estimated: 45 mL/min — ABNORMAL LOW (ref >60)
GFR, Estimated: 47 mL/min — ABNORMAL LOW (ref >60)
Glucose, Bld: 131 mg/dL — ABNORMAL HIGH (ref 70–99)
Glucose, Bld: 139 mg/dL — ABNORMAL HIGH (ref 70–99)
Potassium: 4.2 mmol/L (ref 3.5–5.1)
Potassium: 3.5 mmol/L (ref 3.5–5.1)
Sodium: 140 mmol/L (ref 135–145)
Sodium: 144 mmol/L (ref 135–145)
Total Bilirubin: 0.6 mg/dL (ref 0.3–1.2)
Total Bilirubin: 1 mg/dL (ref 0.3–1.2)
Total Protein: 5.6 g/dL — ABNORMAL LOW (ref 6.5–8.1)
Total Protein: 6 g/dL — ABNORMAL LOW (ref 6.5–8.1)

## 2022-02-04 LAB — GLUCOSE, CAPILLARY
Glucose-Capillary: 137 mg/dL — ABNORMAL HIGH (ref 70–99)
Glucose-Capillary: 113 mg/dL — ABNORMAL HIGH (ref 70–99)
Glucose-Capillary: 128 mg/dL — ABNORMAL HIGH (ref 70–99)
Glucose-Capillary: 145 mg/dL — ABNORMAL HIGH (ref 70–99)
Glucose-Capillary: 169 mg/dL — ABNORMAL HIGH (ref 70–99)

## 2022-02-04 LAB — CBC
HCT: 34.6 % — ABNORMAL LOW (ref 36.0–46.0)
Hemoglobin: 11.3 g/dL — ABNORMAL LOW (ref 12.0–15.0)
MCH: 28.5 pg (ref 26.0–34.0)
MCHC: 32.7 g/dL (ref 30.0–36.0)
MCV: 87.2 fL (ref 80.0–100.0)
Platelets: 333 10*3/uL (ref 150–400)
RBC: 3.97 MIL/uL (ref 3.87–5.11)
RDW: 14.9 % (ref 11.5–15.5)
WBC: 22.9 10*3/uL — ABNORMAL HIGH (ref 4.0–10.5)
nRBC: 0 % (ref 0.0–0.2)

## 2022-02-04 LAB — LIPID PANEL
Cholesterol: 151 mg/dL (ref 0–200)
HDL: 39 mg/dL — ABNORMAL LOW (ref >40)
LDL Cholesterol: 95 mg/dL (ref 0–99)
Total CHOL/HDL Ratio: 3.9 RATIO
Triglycerides: 83 mg/dL (ref <150)
VLDL: 17 mg/dL (ref 0–40)

## 2022-02-04 LAB — COOXEMETRY PANEL
Carboxyhemoglobin: 1.2 % (ref 0.5–1.5)
Methemoglobin: 0.7 % (ref 0.0–1.5)
O2 Saturation: 73.4 %
Total hemoglobin: 11.6 g/dL — ABNORMAL LOW (ref 12.0–16.0)

## 2022-02-04 LAB — HEPARIN LEVEL (UNFRACTIONATED)
Heparin Unfractionated: 0.64 IU/mL (ref 0.30–0.70)
Heparin Unfractionated: 0.15 IU/mL — ABNORMAL LOW (ref 0.30–0.70)

## 2022-02-04 LAB — MAGNESIUM
Magnesium: 2 mg/dL (ref 1.7–2.4)
Magnesium: 1.8 mg/dL (ref 1.7–2.4)

## 2022-02-04 LAB — LEGIONELLA PNEUMOPHILA SEROGP 1 UR AG: L. pneumophila Serogp 1 Ur Ag: NEGATIVE

## 2022-02-04 LAB — HEMOGLOBIN A1C
Hgb A1c MFr Bld: 5.9 % — ABNORMAL HIGH (ref 4.8–5.6)
Mean Plasma Glucose: 123 mg/dL

## 2022-02-04 LAB — PROCALCITONIN: Procalcitonin: 1.38 ng/mL

## 2022-02-04 LAB — TROPONIN I (HIGH SENSITIVITY): Troponin I (High Sensitivity): 1454 ng/L (ref <18)

## 2022-02-04 LAB — PHOSPHORUS: Phosphorus: 3.4 mg/dL (ref 2.5–4.6)

## 2022-02-04 MED ORDER — POLYETHYLENE GLYCOL 3350 17 G PO PACK: 17.0000 g | PACK | Freq: Every day | ORAL | Status: DC | PRN

## 2022-02-04 MED ORDER — PROSOURCE TF PO LIQD
45.0000 mL | Freq: Two times a day (BID) | ORAL | Status: DC
  Administered 2022-02-04 – 2022-02-05 (×3): 45 mL
  Filled 2022-02-04 (×2): qty 45

## 2022-02-04 MED ORDER — MAGNESIUM SULFATE 2 GM/50ML IV SOLN
2.0000 g | Freq: Once | INTRAVENOUS | Status: AC
  Administered 2022-02-04: 2 g via INTRAVENOUS
  Filled 2022-02-04: qty 50

## 2022-02-04 MED ORDER — ACETAMINOPHEN 160 MG/5ML PO SOLN
650.0000 mg | ORAL | Status: DC | PRN
  Administered 2022-02-04 – 2022-02-05 (×2): 650 mg
  Filled 2022-02-04 (×2): qty 20.3

## 2022-02-04 MED ORDER — POTASSIUM CHLORIDE 20 MEQ PO PACK
40.0000 meq | PACK | Freq: Two times a day (BID) | ORAL | Status: AC
  Administered 2022-02-04 (×2): 40 meq
  Filled 2022-02-04 (×2): qty 2

## 2022-02-04 MED ORDER — MILRINONE LACTATE IN DEXTROSE 20-5 MG/100ML-% IV SOLN
0.1250 ug/kg/min | INTRAVENOUS | Status: DC
  Administered 2022-02-04 – 2022-02-05 (×2): 0.125 ug/kg/min via INTRAVENOUS
  Filled 2022-02-04 (×3): qty 100

## 2022-02-04 MED ORDER — VITAL HIGH PROTEIN PO LIQD
1000.0000 mL | ORAL | Status: DC
  Administered 2022-02-04 – 2022-02-05 (×2): 1000 mL

## 2022-02-04 MED ORDER — CHLORHEXIDINE GLUCONATE CLOTH 2 % EX PADS
6.0000 | MEDICATED_PAD | Freq: Every day | CUTANEOUS | Status: DC
  Administered 2022-02-04 – 2022-02-05 (×2): 6 via TOPICAL

## 2022-02-04 MED ORDER — AZITHROMYCIN 250 MG PO TABS
250.0000 mg | ORAL_TABLET | Freq: Every day | ORAL | Status: DC
  Filled 2022-02-04: qty 1

## 2022-02-04 MED ORDER — DEXMEDETOMIDINE HCL IN NACL 400 MCG/100ML IV SOLN
0.2000 ug/kg/h | INTRAVENOUS | Status: DC
  Administered 2022-02-04 – 2022-02-06 (×5): 0.6 ug/kg/h via INTRAVENOUS
  Filled 2022-02-04 (×4): qty 100

## 2022-02-04 NOTE — Progress Notes (Signed)
ANTICOAGULATION CONSULT NOTE ? ?Pharmacy Consult for Heparin ?Indication: chest pain/ACS/ IABP ? ?Allergies  ?Allergen Reactions  ? Botox [Onabotulinumtoxina]   ?  Was given too much botox and at night, felt like her throat was closing up.   ? Diclofenac Other (See Comments)  ?  Unknown reaction per pt and SO  ? Lisinopril Swelling  ? Simvastatin Other (See Comments)  ?  myalgias  ? Statins Other (See Comments)  ?  myalgias  ? Sulfa Antibiotics Itching  ? ? ?Patient Measurements: ?Height: '5\' 4"'$  (162.6 cm) ?Weight: 59.4 kg (130 lb 15.3 oz) ?IBW/kg (Calculated) : 54.7 ?Heparin Dosing Weight: 60 kg ? ?Vital Signs: ?Temp: 98.6 ?F (37 ?C) (04/05 1600) ?Temp Source: Core (04/05 1200) ?BP: 98/58 (04/05 1600) ?Pulse Rate: 81 (04/05 1534) ? ?Labs: ?Recent Labs  ?  02/02/22 ?2321 02/03/22 ?0210 02/03/22 ?6010 02/03/22 ?0601 02/03/22 ?1655 02/03/22 ?1658 02/03/22 ?2041 02/04/22 ?0300 02/04/22 ?1216  ?HGB 11.9*  --    < > 12.7   < > 11.6* 11.9* 11.3*  --   ?HCT 36.6  --    < > 39.6   < > 34.0* 35.0* 34.6*  --   ?PLT 395  --   --  402*  --   --   --  333  --   ?APTT 28  --   --   --   --   --   --   --   --   ?LABPROT 12.2  --   --   --   --   --   --   --   --   ?INR 0.9  --   --   --   --   --   --   --   --   ?HEPARINUNFRC  --   --   --   --   --   --   --  0.64 0.15*  ?CREATININE 0.73  --   --  0.85  --   --   --  1.25* 1.20*  ?TROPONINIHS 708* 1,454*  --  6,513*  --   --   --   --   --   ? < > = values in this interval not displayed.  ? ? ? ?Estimated Creatinine Clearance: 34.4 mL/min (A) (by C-G formula based on SCr of 1.2 mg/dL (H)). ? ? ?Assessment: ?76 y.o. F presents with cough/SOB. Trop elevated to 1454. To begin heparin for ACS. No AC PTA. CBC ok on admission. Heparin changed to enoxaparin 4/4 pre-cath > in cath lab was intubated and IABP placed. TCTS will plan for CABG/MV replacement if pt can stabilize on IABP, inotropic support. Heparin gtt restarted post-cath Pt also on tirofiban x 18 hours. ? ?Heparin level  supratherapeutic (0.64) for lower goal with IABP and tirofiban on board. No issues with bleeding per RN. ? ?Goal of Therapy:  ?Heparin level 0.2-0.5 units/ml ?Monitor platelets by anticoagulation protocol: Yes ?  ?Plan:  ?Increase heparin gtt to 600 units/hr ?Will f/u 8 hr heparin level ? ?Hildred Laser, PharmD ?Clinical Pharmacist ?**Pharmacist phone directory can now be found on [email or web address] ([email or web address]).  Listed under [place]. ? ? ? ? ? ?

## 2022-02-04 NOTE — Progress Notes (Signed)
Heart Failure Navigator Progress Note ? ?Assessed for Heart & Vascular TOC clinic readiness.  ?Patient does not meet criteria due to being seen by advance Heart Failure Team..  ? ? ? ?Earnestine Leys, BSN, RN ?Heart Failure Nurse Navigator ?[phone]   ?

## 2022-02-04 NOTE — Progress Notes (Signed)
Orthopedic Tech Progress Note ?Patient Details:  ?Tonya Hamilton ?Nov 07, 1945 ?[id number] ? ?Ortho Devices ?Type of Ortho Device: Knee Immobilizer ?Ortho Device/Splint Location: lle ?Ortho Device/Splint Interventions: Ordered, Application, Adjustment ?  ?Post Interventions ?Patient Tolerated: Well ? ?[name] ?02/04/2022, 12:37 AM ? ?

## 2022-02-04 NOTE — Consult Note (Addendum)
?  ?Advanced Heart Failure Team Consult Note ? ? ?Primary Physician: [name], MD ?PCP-Cardiologist:  None ? ?Reason for Consultation: Cardiogenic Shock  ? ?HPI:   ? ?Tonya Hamilton is seen today for evaluation of cardiogenic shock  at the request of Dr Gwenlyn Found.  ? ?Tonya Hamilton is a 76 year old with h/o HTN, hyperlipidemia, hypothyroidism, diverticulitis, arthritis, GERD, and chronic/frequent UTI.  ? ?Presented [facility] ED with increased shortness of breath. Hypoxic on room air. HS trop [phone], WBC 23.6, respiratory panel negative. EKG ST depression. Bld and urine culture pending.  CXR pulmonary edema. Echo EF 30-35%  mild LV hypertrophy of basal septal segment, akinetic inferior and posterior wall, hypokinetic anterolateral wall and entire apex, grade 3 DD, elevated LA pressure, RV normal, moderately elevated PASP with RVSP 80mHg, mildly dilated LA, large pleural effusion in the left lateral region, severe MR, aortic sclerosis. In the ED given IV lasix. Placed on Bipap.  ? ?[facility] Taken to the cath lab severe 3 vessel disease with occluded distal LCx-OM 3 with severe upstream disease as the culprit lesion but also significant disease in the LAD/diagonal system as well as RCA. RA 9 PAP 43/22 mean 32, PCWP 26, fick CO 3.2, CI 1.9> IABP placed in the lab. Placed milrinone + norepi + lasix drip @ 4. Intubated in the lab.  ? ?CT surgery consulted. Tentative for CABG + MVR later this week.  ? ?Currently on milrinone 0.125 mcg +norepi 4 mcg + lasix drip. IABP @ 1:1. CO-OX 73% ?Negative > 1.7 liters.  ? ?Awake on the vent.On 50% and PEEP 8 . CXR today with resolving pulmonary edema.  ? ?[place]wan #s  ?RA 7 ?PA 19/11 ?SVR 1477 ?CO  4.3  ?CI 2.6  ?CO-OX 73%.  ? ?Review of Systems: [y] = yes, '[ ]'$  = no  Patient is intubated. Therefore history has been obtained from chart review.   ? ?General: Weight gain '[ ]'$ ; Weight loss '[ ]'$ ; Anorexia '[ ]'$ ; Fatigue '[ ]'$ ; Fever '[ ]'$ ; Chills [  ]; Weakness '[ ]'$   ?Cardiac: Chest pain/pressure '[ ]'$ ; Resting SOB '[ ]'$ ; Exertional SOB [ Y]; Orthopnea '[ ]'$ ; Pedal Edema '[ ]'$ ; Palpitations '[ ]'$ ; Syncope '[ ]'$ ; Presyncope '[ ]'$ ; Paroxysmal nocturnal dyspnea'[ ]'$   ?Pulmonary: Cough '[ ]'$ ; Wheezing'[ ]'$ ; Hemoptysis'[ ]'$ ; Sputum '[ ]'$ ; Snoring '[ ]'$   ?GI: Vomiting'[ ]'$ ; Dysphagia'[ ]'$ ; Melena'[ ]'$ ; Hematochezia '[ ]'$ ; Heartburn'[ ]'$ ; Abdominal pain '[ ]'$ ; Constipation '[ ]'$ ; Diarrhea '[ ]'$ ; BRBPR '[ ]'$   ?GU: Hematuria'[ ]'$ ; Dysuria '[ ]'$ ; Nocturia'[ ]'$   ?Vascular: Pain in legs with walking '[ ]'$ ; Pain in feet with lying flat '[ ]'$ ; Non-healing sores '[ ]'$ ; Stroke '[ ]'$ ; TIA '[ ]'$ ; Slurred speech '[ ]'$ ;  ?Neuro: Headaches'[ ]'$ ; Vertigo'[ ]'$ ; Seizures'[ ]'$ ; Paresthesias'[ ]'$ ;Blurred vision '[ ]'$ ; Diplopia '[ ]'$ ; Vision changes '[ ]'$   ?Ortho/Skin: Arthritis '[ ]'$ ; Joint pain [ Y]; Muscle pain '[ ]'$ ; Joint swelling '[ ]'$ ; Back Pain '[ ]'$ ; Rash '[ ]'$   ?Psych: Depression'[ ]'$ ; Anxiety'[ ]'$   ?Heme: Bleeding problems '[ ]'$ ; Clotting disorders '[ ]'$ ; Anemia '[ ]'$   ?Endocrine: Diabetes '[ ]'$ ; Thyroid dysfunction[Y ] ? ?Home Medications ?Prior to Admission medications   ?Medication Sig Start Date End Date Taking? Authorizing Provider  ?amLODipine (NORVASC) 5 MG tablet TAKE 1 TABLET BY MOUTH EVERY DAY ?Patient taking differently: Take 5 mg by mouth daily. 01/15/22  Yes Burchette, BAlinda Sierras MD  ?betamethasone dipropionate 0.05 % cream  Apply to affected rash two times daily no longer than 2 weeks of continuous rash. ?Patient taking differently: Apply 1 application. topically 2 (two) times daily. Apply to right hand 01/20/22  Yes Burchette, Alinda Sierras, MD  ?bismuth subsalicylate (PEPTO BISMOL) 262 MG/15ML suspension Take 30 mLs by mouth every 6 (six) hours as needed for indigestion or diarrhea or loose stools.   Yes [provider]  ?fluticasone (FLONASE) 50 MCG/ACT nasal spray Place 1-2 sprays into both nostrils daily as needed for allergies or rhinitis.   Yes [provider]  ?ibuprofen (ADVIL) 200 MG tablet Take 400 mg by mouth every 6 (six) hours as needed  for headache or mild pain.   Yes [provider]  ?levothyroxine (SYNTHROID) 25 MCG tablet TAKE 1 TABLET BY MOUTH EVERY DAY ?Patient taking differently: Take 25 mcg by mouth daily before breakfast. 12/10/21  Yes Burchette, Alinda Sierras, MD  ?ondansetron (ZOFRAN ODT) 4 MG disintegrating tablet Take 1 tablet (4 mg total) by mouth every 8 (eight) hours as needed for nausea or vomiting. 07/23/20  Yes Burchette, Alinda Sierras, MD  ?pantoprazole (PROTONIX) 40 MG tablet Take 40 mg by mouth daily. 08/06/20  Yes [provider]  ?trimethoprim (TRIMPEX) 100 MG tablet TAKE 1 TABLET BY MOUTH EVERY DAY ?Patient taking differently: Take 100 mg by mouth daily. 01/28/22  Yes Burchette, Alinda Sierras, MD  ?cephALEXin (KEFLEX) 500 MG capsule Take 1 capsule (500 mg total) by mouth 3 (three) times daily. ?Patient not taking: Reported on 02/03/2022 01/20/22   [name], MD  ?ciclopirox (LOPROX) 0.77 % cream Apply topically 2 (two) times daily. ?Patient not taking: Reported on 02/03/2022 12/23/21   [name], MD  ? ? ?Past Medical History: ?Past Medical History:  ?Diagnosis Date  ? Arthritis   ? Colon polyps   ? Diverticulitis   ? Hyperlipidemia   ? Hypertension   ? Hypothyroidism   ? PONV (postoperative nausea and vomiting)   ? Thyroid disease   ? UTI (urinary tract infection)   ? ? ?Past Surgical History: ?Past Surgical History:  ?Procedure Laterality Date  ? ABDOMINAL HYSTERECTOMY  1993  ? TAH, endometriosis  ? CHOLECYSTECTOMY  2002  ? IABP INSERTION N/A 02/03/2022  ? Procedure: IABP Insertion;  Surgeon: Leonie Man, MD;  Location: [place] [facility];  Service: Cardiovascular;  Laterality: N/A;  ? RIGHT/LEFT HEART CATH AND CORONARY ANGIOGRAPHY N/A 02/03/2022  ? Procedure: RIGHT/LEFT HEART CATH AND CORONARY ANGIOGRAPHY;  Surgeon: Leonie Man, MD;  Location: [place] [facility];  Service: Cardiovascular;  Laterality: N/A;  ? TOTAL HIP ARTHROPLASTY Right 05/31/2013  ? Procedure: RIGHT TOTAL HIP ARTHROPLASTY ANTERIOR APPROACH;   Surgeon: Gearlean Alf, MD;  Location: [facility];  Service: Orthopedics;  Laterality: Right;  ? ? ?Family History: ?Family History  ?Problem Relation Age of Onset  ? Arthritis Mother   ? Hypertension Mother   ? Heart disease Mother   ? Hypertension Father   ? Stroke Father   ? Heart disease Father   ? Heart Problems Maternal Grandfather   ? Breast cancer Neg Hx   ? ? ?Social History: ?Social History  ? ?Socioeconomic History  ? Marital status: Married  ?  Spouse name: Juanda Crumble   ? Number of children: 2  ? Years of education: 39  ? Highest education level: Not on file  ?Occupational History  ? Not on file  ?Tobacco Use  ? Smoking status: Never  ? Smokeless tobacco: Never  ?Vaping  Use  ? Vaping Use: Never used  ?Substance and Sexual Activity  ? Alcohol use: No  ? Drug use: No  ? Sexual activity: Not Currently  ?Other Topics Concern  ? Not on file  ?Social History Narrative  ? 2 cups coffee per day  ? Soda daily (1/day)  ? Lives with husband  ? Right handed  ? ?Social Determinants of Health  ? ?Financial Resource Strain: Low Risk   ? Difficulty of Paying Living Expenses: Not hard at all  ?Food Insecurity: No Food Insecurity  ? Worried About Charity fundraiser in the Last Year: Never true  ? Ran Out of Food in the Last Year: Never true  ?Transportation Needs: No Transportation Needs  ? Lack of Transportation (Medical): No  ? Lack of Transportation (Non-Medical): No  ?Physical Activity: Insufficiently Active  ? Days of Exercise per Week: 5 days  ? Minutes of Exercise per Session: 10 min  ?Stress: No Stress Concern Present  ? Feeling of Stress : Not at all  ?Social Connections: Socially Integrated  ? Frequency of Communication with Friends and Family: Twice a week  ? Frequency of Social Gatherings with Friends and Family: Twice a week  ? Attends Religious Services: More than 4 times per year  ? Active Member of Clubs or Organizations: Yes  ? Attends Archivist Meetings: More than 4 times per year  ? Marital  Status: Married  ? ? ?Allergies:  ?Allergies  ?Allergen Reactions  ? Botox [Onabotulinumtoxina]   ?  Was given too much botox and at night, felt like her throat was closing up.   ? Diclofenac Other (See Co

## 2022-02-04 NOTE — Progress Notes (Signed)
ANTICOAGULATION CONSULT NOTE ? ?Pharmacy Consult for Heparin ?Indication: chest pain/ACS/ IABP ? ?Allergies  ?Allergen Reactions  ? Botox [Onabotulinumtoxina]   ?  Was given too much botox and at night, felt like her throat was closing up.   ? Diclofenac Other (See Comments)  ?  Unknown reaction per pt and SO  ? Lisinopril Swelling  ? Simvastatin Other (See Comments)  ?  myalgias  ? Statins Other (See Comments)  ?  myalgias  ? Sulfa Antibiotics Itching  ? ? ?Patient Measurements: ?Height: '5\' 4"'$  (162.6 cm) ?Weight: 59.9 kg (132 lb 0.9 oz) ?IBW/kg (Calculated) : 54.7 ?Heparin Dosing Weight: 60 kg ? ?Vital Signs: ?Temp: 100 ?F (37.8 ?C) (04/05 0300) ?Temp Source: Core (04/05 0000) ?BP: 111/67 (04/05 0300) ?Pulse Rate: 85 (04/05 0300) ? ?Labs: ?Recent Labs  ?  02/02/22 ?2321 02/03/22 ?0210 02/03/22 ?5277 02/03/22 ?0601 02/03/22 ?1655 02/03/22 ?1658 02/03/22 ?2041 02/04/22 ?0300  ?HGB 11.9*  --    < > 12.7   < > 11.6* 11.9* 11.3*  ?HCT 36.6  --    < > 39.6   < > 34.0* 35.0* 34.6*  ?PLT 395  --   --  402*  --   --   --  333  ?APTT 28  --   --   --   --   --   --   --   ?LABPROT 12.2  --   --   --   --   --   --   --   ?INR 0.9  --   --   --   --   --   --   --   ?HEPARINUNFRC  --   --   --   --   --   --   --  0.64  ?CREATININE 0.73  --   --  0.85  --   --   --   --   ?TROPONINIHS 708* 1,454*  --  6,513*  --   --   --   --   ? < > = values in this interval not displayed.  ? ? ? ?Estimated Creatinine Clearance: 48.6 mL/min (by C-G formula based on SCr of 0.85 mg/dL). ? ? ?Assessment: ?76 y.o. F presents with cough/SOB. Trop elevated to 1454. To begin heparin for ACS. No AC PTA. CBC ok on admission. Heparin changed to enoxaparin 4/4 pre-cath > in cath lab was intubated and IABP placed. TCTS will plan for CABG/MV replacement if pt can stabilize on IABP, inotropic support. Heparin gtt restarted post-cath Pt also on tirofiban x 18 hours. ? ?Heparin level supratherapeutic (0.64) for lower goal with IABP and tirofiban on board.  No issues with bleeding per RN. ? ?Goal of Therapy:  ?Heparin level 0.2-0.5 units/ml ?Monitor platelets by anticoagulation protocol: Yes ?  ?Plan:  ?Decrease heparin gtt to 500 units/hr ?Will f/u 8 hr heparin level ? ?Sherlon Handing, PharmD, BCPS ?Please see amion for complete clinical pharmacist phone list ?02/04/2022 3:44 AM  ? ? ? ?

## 2022-02-04 NOTE — Telephone Encounter (Signed)
Error entry

## 2022-02-04 NOTE — Progress Notes (Signed)
? ?NAME:  Tonya Hamilton, MRN:  [id number], DOB:  06/02/1946, LOS: 1 ?ADMISSION DATE:  02/02/2022, CONSULTATION DATE:  02/03/22 ?REFERRING MD:  Christy Gentles CHIEF COMPLAINT:  Dyspnea  ? ?History of Present Illness:  ?Tonya Hamilton is a 76 y.o. female who has a PMH as outlined below.  She presented to [facility] ED 4/3 with dyspnea.  She states that symptoms began suddenly and progressed.  She also endorsed cough intimately productive of sputum.  No fevers/chills/sweats, chest pain, N/V/D, abdominal pain, myalgias, exposures known sick contacts, recent travel. ? ?In ED, she was found to be hypoxic.  CXR showed bilateral opacities and bilateral mild edema.  Troponin was elevated at 708 with repeat up to 1454.  BNP mildly elevated at 366.  EKG with ST depressions and global ischemia.  Leukocytosis to 23.6.  She was initially placed on Ventimask but was later upgraded to BiPAP.  She continued to require BiPAP which she did feel subjective improvement in symptoms.  Patient stable subsequently called for transfer to ICU at [facility] for further evaluation management. ? ?Pertinent  Medical History:  ?has History of colonic diverticulitis; Osteoarthritis; Hypertension; Hyperlipidemia; Hypothyroid; GERD (gastroesophageal reflux disease); Osteopenia; Anxiety; Osteoarthritis of right hip; Angioedema; Chronic intractable headache; Occipital neuralgia of left side; Neck pain; Hyperreflexia; Arthritis, multiple joint involvement; Spasmodic dysphonia; Acute respiratory failure ([place]); Respiratory failure ([place]); Community acquired pneumonia; Non-STEMI (non-ST elevated myocardial infarction) ([place]); Cardiogenic shock ([place]); Severe mitral regurgitation; Ischemic cardiomyopathy; and Multiple vessel coronary artery disease on their problem list. ? ? ?Significant Hospital Events: ?Including procedures, antibiotic start and stop dates in addition to other pertinent events   ?4/4 admit, started on Cefepime and Azith and Bipap.  Trop rising, on  heparin gtt,  Echo with reduced EF and regional wall motion abnormalities with severe MR, cardiology consulted and taken to cath lab which revealed severe LM and 3V disease.  IABP placed and intubated, in cardiogenic shock ? ? ? ?Studies: ? ?Echo 4/5>  ?The inferior wall and posterior wall are akinetic. The antero-lateral wall  ?and entire apex are hypokinetic. The anterior wall, anterior septum, mid  ?inferoseptal segment, and basal inferoseptal segment are normal.  ? ?Right Ventricle: The right ventricular size is normal. No increase in  ?right ventricular wall thickness. Right ventricular systolic function is  ?normal. There is moderately elevated pulmonary artery systolic pressure.  ?The tricuspid regurgitant velocity is  ?2.96 m/s, and with an assumed right atrial pressure of 15 mmHg, the  ?estimated right ventricular systolic pressure is 20.2 mmHg.  ? ?Abx ? ?Cefdinir 4/4- ? ?Interim History / Subjective:  ? ?Taken to cath lab last night and IAPB placed, EF 30% with wall motion abnormality and severe MR,  intubated.    Looking better this morning, awake on vent  ? ?Objective:  ?Blood pressure 131/64, pulse 89, temperature 99.3 ?F (37.4 ?C), resp. rate 20, height '5\' 4"'$  (1.626 m), weight 59.4 kg, SpO2 98 %. ?PAP: (18-39)/(11-26) 21/12 ?CVP:  [3 mmHg-8 mmHg] 8 mmHg ?CO:  [2.3 L/min-4.4 L/min] 4.3 L/min ?CI:  [1.4 L/min/m2-2.7 L/min/m2] 2.6 L/min/m2  ?Vent Mode: PRVC ?FiO2 (%):  [60 %-100 %] 60 % ?Set Rate:  [20 bmp] 20 bmp ?Vt Set:  [440 mL] 440 mL ?PEEP:  [8 cmH20] 8 cmH20 ?Plateau Pressure:  [17 cmH20-21 cmH20] 18 cmH20  ? ?Intake/Output Summary (Last 24 hours) at 02/04/2022 0759 ?Last data filed at 02/04/2022 0700 ?Gross per 24 hour  ?Intake 918.95 ml  ?Output 2670 ml  ?Net -5427.05  ml  ? ? ?Filed Weights  ? 02/03/22 0300 02/03/22 0600 02/04/22 0500  ?Weight: 60 kg 59.9 kg 59.4 kg  ? ? ?General:  critically ill-appearing F intubated, awake and in no distress ?HEENT: MM pink/moist, ETT in place, sclera  anicteric ?Neuro: awake and calm on Vent, following commands ?CV: s1s2 rrr, no m/r/g ?PULM:  scattered crackles in bases, on minimal vent support, tolerating PS ?GI: soft, bsx4 active  ?Extremities: warm/dry, no edema  ?Skin: no rashes or lesions ? ? ? ? ?Assessment & Plan:  ? ?CAD and NSTEMI with acute cardiogenic shock and acute hypoxic respiratory failure secondary to pulmonary edema  ?Reported increased exertional dyspnea for the last month ?To Cath lab and Intubated 4/4, severe 3 vessel disease with occluded Lcx-OM ?S/p IABP  ?-appreciate Cardiology, HF and CT surgery assistance ?-started on Milrinone, Levophed and Lasix gtt ?-Plan to optimize as much as possible for CABG and MVR on 4/7 ?-coox 73% ?-continue  to maintain O2 sats >90% ?-continue Asa and heparin gtt and statin ?-cont empiric Cefdenir ?--Maintain full vent support with SAT/SBT as tolerated, consider weaning prior to surgery ?-titrate Vent setting to maintain SpO2 greater than or equal to 90%. ?-HOB elevated 30 degrees. ?-Plateau pressures less than 30 cm H20.  ?-Follow chest x-ray, ABG prn.   ?-Bronchial hygiene and RT/bronchodilator protocol. ? ? ? ?Hx frequent/chronic UTIs - on daily Bactrim as prophylaxis. ?- Continue home Bactrim. ? ?Hx hypothyroidism. ?- Continue home Synthroid. ? ?Hx HTN, HLD. ?- Hold home Amlodipine. ? ?Best practice (evaluated daily):  ?Diet/type: Regular consistency (see orders) ?DVT prophylaxis: systemic heparin ?GI prophylaxis: N/A ?Lines: N/A ?Foley:  Yes, and it is still needed ?Code Status:  full code ?Last date of multidisciplinary goals of care discussion:  Family updated at the bedside 4/5, full code ? ?Labs   ?CBC: ?Recent Labs  ?Lab 02/02/22 ?2321 02/03/22 ?7425 02/03/22 ?0601 02/03/22 ?1655 02/03/22 ?1657 02/03/22 ?1658 02/03/22 ?2041 02/04/22 ?0300  ?WBC 23.6*  --  26.2*  --   --   --   --  22.9*  ?NEUTROABS 21.0*  --   --   --   --   --   --   --   ?HGB 11.9*   < > 12.7 12.2 11.9* 11.6* 11.9* 11.3*  ?HCT  36.6   < > 39.6 36.0 35.0* 34.0* 35.0* 34.6*  ?MCV 86.7  --  88.0  --   --   --   --  87.2  ?PLT 395  --  402*  --   --   --   --  333  ? < > = values in this interval not displayed.  ? ? ? ?Basic Metabolic Panel: ?Recent Labs  ?Lab 02/02/22 ?2321 02/03/22 ?9563 02/03/22 ?0601 02/03/22 ?1655 02/03/22 ?1657 02/03/22 ?1658 02/03/22 ?2041 02/04/22 ?0300  ?NA 139   < > 142 141 143 143 141 140  ?K 3.8   < > 3.7 4.3 4.1 4.0 3.6 4.2  ?CL 103  --  106  --   --   --   --  106  ?CO2 22  --  27  --   --   --   --  23  ?GLUCOSE 178*  --  178*  --   --   --   --  131*  ?BUN 18  --  14  --   --   --   --  20  ?CREATININE 0.73  --  0.85  --   --   --   --  1.25*  ?CALCIUM 8.8*  --  8.1*  --   --   --   --  8.6*  ?MG  --   --  2.0  --   --   --   --  2.0  ?PHOS  --   --  3.0  --   --   --   --   --   ? < > = values in this interval not displayed.  ? ? ?GFR: ?Estimated Creatinine Clearance: 33.1 mL/min (A) (by C-G formula based on SCr of 1.25 mg/dL (H)). ?Recent Labs  ?Lab 02/02/22 ?2321 02/03/22 ?0601 02/04/22 ?0300  ?PROCALCITON  --  0.30 1.38  ?WBC 23.6* 26.2* 22.9*  ?LATICACIDVEN 1.6 2.3*  --   ? ? ? ?Liver Function Tests: ?Recent Labs  ?Lab 02/04/22 ?0300  ?AST 97*  ?ALT 40  ?ALKPHOS 56  ?BILITOT 0.6  ?PROT 5.6*  ?ALBUMIN 2.8*  ? ?No results for input(s): LIPASE, AMYLASE in the last 168 hours. ?No results for input(s): AMMONIA in the last 168 hours. ? ?ABG ?   ?Component Value Date/Time  ? PHART 7.352 02/03/2022 2041  ? PCO2ART 39.1 02/03/2022 2041  ? PO2ART 111 (H) 02/03/2022 2041  ? HCO3 21.6 02/03/2022 2041  ? TCO2 23 02/03/2022 2041  ? ACIDBASEDEF 4.0 (H) 02/03/2022 2041  ? O2SAT 73.4 02/04/2022 0300  ?  ? ?Coagulation Profile: ?Recent Labs  ?Lab 02/02/22 ?2321  ?INR 0.9  ? ? ? ?Cardiac Enzymes: ?No results for input(s): CKTOTAL, CKMB, CKMBINDEX, TROPONINI in the last 168 hours. ? ?HbA1C: ?Hgb A1c MFr Bld  ?Date/Time Value Ref Range Status  ?02/03/2022 06:01 AM 5.9 (H) 4.8 - 5.6 % Final  ?  Comment:  ?  (NOTE) ?         Prediabetes: 5.7 - 6.4 ?        Diabetes: >6.4 ?        Glycemic control for adults with diabetes: <7.0 ?  ? ? ?CBG: ?Recent Labs  ?Lab 02/03/22 ?1610 02/03/22 ?1137 02/03/22 ?2030 02/03/22 ?2336 02/04/22 ?9604  ?GLUC

## 2022-02-04 NOTE — Progress Notes (Signed)
1 Day Post-Op Procedure(s) (LRB): ?RIGHT/LEFT HEART CATH AND CORONARY ANGIOGRAPHY (N/A) ?IABP Insertion (N/A) ?Subjective: ? ?Intubated on vent but awake and alert on fentanyl drip 75.  ? ?Started on milrinone for CI 1.4 last pm. Now on 0.125 milrinone and NE 5 with CI 2.6, PA 23/15 CVP 6 Co-ox 73.4%.  ? ?Vent on 60% 8 PEEP which is improved from 100% last night. ? ?-1.7 L on lasix drip 4. Slight bump in creat to 1.25 but CXR shows improvement in edema. ? ?Objective: ?Vital signs in last 24 hours: ?Temp:  [98.3 ?F (36.8 ?C)-100.2 ?F (37.9 ?C)] 99.3 ?F (37.4 ?C) (04/05 0700) ?Pulse Rate:  [76-177] 177 (04/05 0700) ?Cardiac Rhythm: Normal sinus rhythm (04/05 0400) ?Resp:  [0-27] 19 (04/05 0700) ?BP: (75-196)/(46-88) 131/64 (04/05 0700) ?SpO2:  [77 %-100 %] 98 % (04/05 0700) ?FiO2 (%):  [60 %-100 %] 60 % (04/05 0400) ?Weight:  [59.4 kg] 59.4 kg (04/05 0500) ? ?Hemodynamic parameters for last 24 hours: ?PAP: (18-39)/(13-26) 23/15 ?CVP:  [3 mmHg-7 mmHg] 6 mmHg ?CO:  [2.3 L/min-4.4 L/min] 4.3 L/min ?CI:  [1.4 L/min/m2-2.7 L/min/m2] 2.6 L/min/m2 ? ?Intake/Output from previous day: ?04/04 0701 - 04/05 0700 ?In: 676 [I.V.:495.6; NG/GT:90; IV Piggyback:333.3] ?Out: 2670 [Urine:2670] ?Intake/Output this shift: ?No intake/output data recorded. ? ?General appearance: alert and cooperative, intubated on vent ?Neurologic: intact ?Heart: regular rate and rhythm and I don't hear murmur this am. ?Lungs: clear to auscultation bilaterally ?Abdomen: soft, non-tender; bowel sounds normal ?Extremities: edema mild ? ? ?Lab Results: ?Recent Labs  ?  02/03/22 ?0601 02/03/22 ?1655 02/03/22 ?2041 02/04/22 ?0300  ?WBC 26.2*  --   --  22.9*  ?HGB 12.7   < > 11.9* 11.3*  ?HCT 39.6   < > 35.0* 34.6*  ?PLT 402*  --   --  333  ? < > = values in this interval not displayed.  ? ?BMET:  ?Recent Labs  ?  02/03/22 ?0601 02/03/22 ?1655 02/03/22 ?2041 02/04/22 ?0300  ?NA 142   < > 141 140  ?K 3.7   < > 3.6 4.2  ?CL 106  --   --  106  ?CO2 27  --   --  23   ?GLUCOSE 178*  --   --  131*  ?BUN 14  --   --  20  ?CREATININE 0.85  --   --  1.25*  ?CALCIUM 8.1*  --   --  8.6*  ? < > = values in this interval not displayed.  ?  ?PT/INR:  ?Recent Labs  ?  02/02/22 ?2321  ?LABPROT 12.2  ?INR 0.9  ? ?ABG ?   ?Component Value Date/Time  ? PHART 7.352 02/03/2022 2041  ? HCO3 21.6 02/03/2022 2041  ? TCO2 23 02/03/2022 2041  ? ACIDBASEDEF 4.0 (H) 02/03/2022 2041  ? O2SAT 73.4 02/04/2022 0300  ? ?CBG (last 3)  ?Recent Labs  ?  02/03/22 ?2030 02/03/22 ?2336 02/04/22 ?7209  ?GLUCAP 190* 125* 137*  ? ? ?Assessment/Plan: ?S/P Procedure(s) (LRB): ?RIGHT/LEFT HEART CATH AND CORONARY ANGIOGRAPHY (N/A) ?IABP Insertion (N/A) ? ?LM and severe 3 V CAD with EF 30-35% and severe MR s/p NSTEMI with cardiogenic shock and pulmonary edema, acute hypoxemic respiratory failure requiring intubation in cath lab. She is stable this am and oxygenation, edema improving. She will require CABG and MVR and I am aiming towards Friday am. Continue present inotropic, IABP support. Wean oxygen and PEEP as tolerated. Extubation per CCM if she is ready before surgery. ? ?  LOS: 1 day  ? ? ?Tonya Hamilton ?02/04/2022 ? ? ?

## 2022-02-05 ENCOUNTER — Inpatient Hospital Stay (HOSPITAL_COMMUNITY): Payer: Medicare HMO

## 2022-02-05 DIAGNOSIS — J9601 Acute respiratory failure with hypoxia: Secondary | ICD-10-CM | POA: Diagnosis not present

## 2022-02-05 DIAGNOSIS — Z01810 Encounter for preprocedural cardiovascular examination: Secondary | ICD-10-CM | POA: Diagnosis not present

## 2022-02-05 DIAGNOSIS — R570 Cardiogenic shock: Secondary | ICD-10-CM | POA: Diagnosis not present

## 2022-02-05 LAB — COMPREHENSIVE METABOLIC PANEL
ALT: 30 U/L (ref 0–44)
AST: 43 U/L — ABNORMAL HIGH (ref 15–41)
Albumin: 2.6 g/dL — ABNORMAL LOW (ref 3.5–5.0)
Alkaline Phosphatase: 58 U/L (ref 38–126)
Anion gap: 7 (ref 5–15)
BUN: 35 mg/dL — ABNORMAL HIGH (ref 8–23)
CO2: 31 mmol/L (ref 22–32)
Calcium: 8 mg/dL — ABNORMAL LOW (ref 8.9–10.3)
Chloride: 106 mmol/L (ref 98–111)
Creatinine, Ser: 0.96 mg/dL (ref 0.44–1.00)
GFR, Estimated: >60 mL/min (ref >60)
Glucose, Bld: 179 mg/dL — ABNORMAL HIGH (ref 70–99)
Potassium: 4 mmol/L (ref 3.5–5.1)
Sodium: 144 mmol/L (ref 135–145)
Total Bilirubin: 0.8 mg/dL (ref 0.3–1.2)
Total Protein: 5.7 g/dL — ABNORMAL LOW (ref 6.5–8.1)

## 2022-02-05 LAB — CBC
HCT: 33.9 % — ABNORMAL LOW (ref 36.0–46.0)
Hemoglobin: 10.8 g/dL — ABNORMAL LOW (ref 12.0–15.0)
MCH: 28.3 pg (ref 26.0–34.0)
MCHC: 31.9 g/dL (ref 30.0–36.0)
MCV: 88.7 fL (ref 80.0–100.0)
Platelets: 223 10*3/uL (ref 150–400)
RBC: 3.82 MIL/uL — ABNORMAL LOW (ref 3.87–5.11)
RDW: 14.7 % (ref 11.5–15.5)
WBC: 18.4 10*3/uL — ABNORMAL HIGH (ref 4.0–10.5)
nRBC: 0 % (ref 0.0–0.2)

## 2022-02-05 LAB — COMPREHENSIVE METABOLIC PANEL WITH GFR
ALT: 36 U/L (ref 0–44)
AST: 61 U/L — ABNORMAL HIGH (ref 15–41)
Albumin: 2.7 g/dL — ABNORMAL LOW (ref 3.5–5.0)
Alkaline Phosphatase: 58 U/L (ref 38–126)
Anion gap: 9 (ref 5–15)
BUN: 27 mg/dL — ABNORMAL HIGH (ref 8–23)
CO2: 28 mmol/L (ref 22–32)
Calcium: 8 mg/dL — ABNORMAL LOW (ref 8.9–10.3)
Chloride: 105 mmol/L (ref 98–111)
Creatinine, Ser: 1.04 mg/dL — ABNORMAL HIGH (ref 0.44–1.00)
GFR, Estimated: 56 mL/min — ABNORMAL LOW
Glucose, Bld: 194 mg/dL — ABNORMAL HIGH (ref 70–99)
Potassium: 3.9 mmol/L (ref 3.5–5.1)
Sodium: 142 mmol/L (ref 135–145)
Total Bilirubin: 0.5 mg/dL (ref 0.3–1.2)
Total Protein: 5.8 g/dL — ABNORMAL LOW (ref 6.5–8.1)

## 2022-02-05 LAB — GLUCOSE, CAPILLARY
Glucose-Capillary: 124 mg/dL — ABNORMAL HIGH (ref 70–99)
Glucose-Capillary: 136 mg/dL — ABNORMAL HIGH (ref 70–99)
Glucose-Capillary: 149 mg/dL — ABNORMAL HIGH (ref 70–99)
Glucose-Capillary: 172 mg/dL — ABNORMAL HIGH (ref 70–99)
Glucose-Capillary: 177 mg/dL — ABNORMAL HIGH (ref 70–99)
Glucose-Capillary: 181 mg/dL — ABNORMAL HIGH (ref 70–99)
Glucose-Capillary: 183 mg/dL — ABNORMAL HIGH (ref 70–99)

## 2022-02-05 LAB — COOXEMETRY PANEL
Carboxyhemoglobin: 1.3 % (ref 0.5–1.5)
Methemoglobin: 0.7 % (ref 0.0–1.5)
O2 Saturation: 62 %
Total hemoglobin: 11.1 g/dL — ABNORMAL LOW (ref 12.0–16.0)

## 2022-02-05 LAB — PHOSPHORUS
Phosphorus: 3.3 mg/dL (ref 2.5–4.6)
Phosphorus: 3.5 mg/dL (ref 2.5–4.6)

## 2022-02-05 LAB — HEPARIN LEVEL (UNFRACTIONATED)
Heparin Unfractionated: <0.1 [IU]/mL — ABNORMAL LOW (ref 0.30–0.70)
Heparin Unfractionated: 0.11 IU/mL — ABNORMAL LOW (ref 0.30–0.70)

## 2022-02-05 LAB — SURGICAL PCR SCREEN
MRSA, PCR: NEGATIVE
Staphylococcus aureus: NEGATIVE

## 2022-02-05 LAB — PROCALCITONIN: Procalcitonin: 0.72 ng/mL

## 2022-02-05 LAB — MAGNESIUM
Magnesium: 2.1 mg/dL (ref 1.7–2.4)
Magnesium: 2.2 mg/dL (ref 1.7–2.4)

## 2022-02-05 LAB — HEMOGLOBIN A1C
Hgb A1c MFr Bld: 5.7 % — ABNORMAL HIGH (ref 4.8–5.6)
Mean Plasma Glucose: 117 mg/dL

## 2022-02-05 LAB — PREPARE RBC (CROSSMATCH)

## 2022-02-05 MED ORDER — HEPARIN 30,000 UNITS/1000 ML (OHS) CELLSAVER SOLUTION
Status: DC
  Filled 2022-02-05 (×2): qty 1000

## 2022-02-05 MED ORDER — PLASMA-LYTE A IV SOLN
INTRAVENOUS | Status: DC
  Filled 2022-02-05 (×2): qty 2.5

## 2022-02-05 MED ORDER — TRANEXAMIC ACID 1000 MG/10ML IV SOLN
1.5000 mg/kg/h | INTRAVENOUS | Status: AC
  Administered 2022-02-06: 1.5 mg/kg/h via INTRAVENOUS
  Filled 2022-02-05 (×2): qty 25

## 2022-02-05 MED ORDER — CEFAZOLIN SODIUM-DEXTROSE 2-4 GM/100ML-% IV SOLN
2.0000 g | INTRAVENOUS | Status: AC
  Administered 2022-02-06 (×2): 2 g via INTRAVENOUS
  Filled 2022-02-05 (×2): qty 100

## 2022-02-05 MED ORDER — CHLORHEXIDINE GLUCONATE 0.12 % MT SOLN
15.0000 mL | Freq: Once | OROMUCOSAL | Status: AC
  Administered 2022-02-06: 15 mL via OROMUCOSAL
  Filled 2022-02-05: qty 15

## 2022-02-05 MED ORDER — POTASSIUM CHLORIDE 2 MEQ/ML IV SOLN
80.0000 meq | INTRAVENOUS | Status: DC
  Filled 2022-02-05 (×3): qty 40

## 2022-02-05 MED ORDER — CEFAZOLIN SODIUM-DEXTROSE 2-4 GM/100ML-% IV SOLN
2.0000 g | INTRAVENOUS | Status: AC
Start: 2022-02-06 — End: 2022-02-06
  Administered 2022-02-06: 2 g via INTRAVENOUS
  Filled 2022-02-05 (×2): qty 100

## 2022-02-05 MED ORDER — DEXMEDETOMIDINE HCL IN NACL 400 MCG/100ML IV SOLN
0.1000 ug/kg/h | INTRAVENOUS | Status: AC
  Administered 2022-02-06: .5 ug/kg/h via INTRAVENOUS
  Filled 2022-02-05 (×2): qty 100

## 2022-02-05 MED ORDER — TEMAZEPAM 15 MG PO CAPS: 15.0000 mg | ORAL_CAPSULE | Freq: Once | ORAL | Status: DC | PRN

## 2022-02-05 MED ORDER — TRANEXAMIC ACID (OHS) BOLUS VIA INFUSION
15.0000 mg/kg | INTRAVENOUS | Status: AC
  Administered 2022-02-06: 895.5 mg via INTRAVENOUS
  Filled 2022-02-05 (×2): qty 896

## 2022-02-05 MED ORDER — NITROGLYCERIN IN D5W 200-5 MCG/ML-% IV SOLN
2.0000 ug/min | INTRAVENOUS | Status: DC
  Filled 2022-02-05 (×2): qty 250

## 2022-02-05 MED ORDER — TRANEXAMIC ACID (OHS) PUMP PRIME SOLUTION
2.0000 mg/kg | INTRAVENOUS | Status: DC
  Filled 2022-02-05 (×2): qty 1.19

## 2022-02-05 MED ORDER — EPINEPHRINE HCL 5 MG/250ML IV SOLN IN NS
0.0000 ug/min | INTRAVENOUS | Status: DC
  Filled 2022-02-05 (×2): qty 250

## 2022-02-05 MED ORDER — PHENYLEPHRINE HCL-NACL 20-0.9 MG/250ML-% IV SOLN
30.0000 ug/min | INTRAVENOUS | Status: AC
  Administered 2022-02-06: 20 ug/min via INTRAVENOUS
  Filled 2022-02-05 (×2): qty 250

## 2022-02-05 MED ORDER — VITAL AF 1.2 CAL PO LIQD
1000.0000 mL | ORAL | Status: DC
Start: 2022-02-05 — End: 2022-02-06
  Administered 2022-02-05: 1000 mL

## 2022-02-05 MED ORDER — INSULIN REGULAR(HUMAN) IN NACL 100-0.9 UT/100ML-% IV SOLN
INTRAVENOUS | Status: AC
  Administered 2022-02-06: 1.1 [IU]/h via INTRAVENOUS
  Filled 2022-02-05 (×2): qty 100

## 2022-02-05 MED ORDER — CHLORHEXIDINE GLUCONATE CLOTH 2 % EX PADS: 6.0000 | MEDICATED_PAD | Freq: Once | CUTANEOUS | Status: AC

## 2022-02-05 MED ORDER — BISACODYL 10 MG RE SUPP
10.0000 mg | Freq: Once | RECTAL | Status: AC
  Administered 2022-02-05: 10 mg via RECTAL

## 2022-02-05 MED ORDER — FUROSEMIDE 10 MG/ML IJ SOLN
40.0000 mg | Freq: Two times a day (BID) | INTRAMUSCULAR | Status: DC
  Administered 2022-02-05: 40 mg via INTRAVENOUS
  Filled 2022-02-05: qty 4

## 2022-02-05 MED ORDER — MAGNESIUM SULFATE 50 % IJ SOLN
40.0000 meq | INTRAMUSCULAR | Status: DC
  Filled 2022-02-05 (×2): qty 9.85

## 2022-02-05 MED ORDER — MILRINONE LACTATE IN DEXTROSE 20-5 MG/100ML-% IV SOLN
0.3000 ug/kg/min | INTRAVENOUS | Status: DC
  Filled 2022-02-05 (×2): qty 100

## 2022-02-05 MED ORDER — CHLORHEXIDINE GLUCONATE CLOTH 2 % EX PADS
6.0000 | MEDICATED_PAD | Freq: Once | CUTANEOUS | Status: AC
  Administered 2022-02-06: 6 via TOPICAL

## 2022-02-05 MED ORDER — MUPIROCIN 2 % EX OINT
1.0000 "application " | TOPICAL_OINTMENT | Freq: Two times a day (BID) | CUTANEOUS | Status: AC
  Administered 2022-02-05 – 2022-02-10 (×9): 1 via NASAL
  Filled 2022-02-05 (×4): qty 22

## 2022-02-05 MED ORDER — VANCOMYCIN HCL 1250 MG/250ML IV SOLN
1250.0000 mg | INTRAVENOUS | Status: AC
  Administered 2022-02-06: 1250 mg via INTRAVENOUS
  Filled 2022-02-05 (×2): qty 250

## 2022-02-05 MED ORDER — NOREPINEPHRINE 4 MG/250ML-% IV SOLN
0.0000 ug/min | INTRAVENOUS | Status: DC
  Filled 2022-02-05 (×2): qty 250

## 2022-02-05 NOTE — Anesthesia Preprocedure Evaluation (Addendum)
Anesthesia Evaluation  ?Patient identified by MRN, date of birth, ID band ?Patient awake ? ? ? ?Reviewed: ?Allergy & Precautions, NPO status , Patient's Chart, lab work & pertinent test results, Unable to perform ROS - Chart review only ? ?History of Anesthesia Complications ?(+) PONV and history of anesthetic complications ? ?Airway ?Mallampati: Intubated ? ? ? ? ? ? Dental ? ?(+) Poor Dentition ?  ?Pulmonary ? ?Intubated, sedated ?  ?breath sounds clear to auscultation ? ? ? ? ? ? Cardiovascular ?hypertension, Pt. on medications ?pulmonary hypertension+ angina + CAD and + Past MI  ? ?Rhythm:Regular Rate:Normal ?+ Systolic murmurs ?02/03/2022 cath: severe 3v ASCAD, EF 35%, severe MR ?  Required IABP and intubation in cath lab ?  Cardiogenic shock: Milrinone, Norepi ?  ?Neuro/Psych ? Headaches, Anxiety   ? GI/Hepatic ?Neg liver ROS, GERD  Medicated,  ?Endo/Other  ?Hypothyroidism  ? Renal/GU ?negative Renal ROS  ? ?  ?Musculoskeletal ? ?(+) Arthritis ,  ? Abdominal ?  ?Peds ? Hematology ? ?(+) Blood dyscrasia (Hb 10.8), anemia ,   ?Anesthesia Other Findings ? ? Reproductive/Obstetrics ? ?  ? ? ? ? ? ? ? ? ? ? ? ? ? ?  ?  ? ? ? ? ? ? ? ?Anesthesia Physical ?Anesthesia Plan ? ?ASA: 4 ? ?Anesthesia Plan: General  ? ?Post-op Pain Management:   ? ?Induction: Inhalational ? ?PONV Risk Score and Plan: 3 and Treatment may vary due to age or medical condition ? ?Airway Management Planned: Oral ETT ? ?Additional Equipment: Arterial line, PA Cath and TEE ? ?Intra-op Plan:  ? ?Post-operative Plan: Post-operative intubation/ventilation ? ?Informed Consent: I have reviewed the patients History and Physical, chart, labs and discussed the procedure including the risks, benefits and alternatives for the proposed anesthesia with the patient or authorized representative who has indicated his/her understanding and acceptance.  ? ? ? ?History available from chart only ? ?Plan Discussed with:  ? ?Anesthesia  Plan Comments: (Consent by Dr. Cyndia Bent (pt intubated))  ? ? ? ? ? ?Anesthesia Quick Evaluation ? ?

## 2022-02-05 NOTE — H&P (View-Only) (Signed)
2 Days Post-Op Procedure(s) (LRB): ?RIGHT/LEFT HEART CATH AND CORONARY ANGIOGRAPHY (N/A) ?IABP Insertion (N/A) ?Subjective: ? ?Remains intubated on vent. FiO2 down to 40%. ? ?CI 2.1 with Co-ox 62% on milrinone 0.125 and NE 4 ? ?UO adequate on lasix drip 4 ? ?Objective: ?Vital signs in last 24 hours: ?Temp:  [98.4 ?F (36.9 ?C)-99.7 ?F (37.6 ?C)] 99.1 ?F (37.3 ?C) (04/06 1300) ?Pulse Rate:  [63-218] 91 (04/06 1201) ?Cardiac Rhythm: Normal sinus rhythm (04/06 0730) ?Resp:  [14-21] 14 (04/06 1300) ?BP: (92-137)/(32-77) 137/49 (04/06 1300) ?SpO2:  [93 %-100 %] 98 % (04/06 1300) ?Arterial Line BP: (97-153)/(29-72) 145/47 (04/06 1300) ?FiO2 (%):  [40 %] 40 % (04/06 1200) ?Weight:  [59.7 kg] 59.7 kg (04/06 0500) ? ?Hemodynamic parameters for last 24 hours: ?PAP: (15-39)/(10-22) 21/17 ?CVP:  [3 mmHg-12 mmHg] 5 mmHg ?CO:  [3.2 L/min-4.5 L/min] 3.5 L/min ?CI:  [2 L/min/m2-2.8 L/min/m2] 2.1 L/min/m2 ? ?Intake/Output from previous day: ?04/05 0701 - 04/06 0700 ?In: 1326.8 [I.V.:531.4; NG/GT:750; IV Piggyback:45.4] ?Out: 2625 [Urine:2625] ?Intake/Output this shift: ?Total I/O ?In: 197 [I.V.:134; NG/GT:360] ?Out: 600 [Urine:600] ? ?General appearance: alert and cooperative, intubated on vent ?Neurologic: intact ?Heart: regular rate and rhythm ?Lungs: clear to auscultation bilaterally ?Extremities: warm, no edema ? ? ?Lab Results: ?Recent Labs  ?  02/04/22 ?0300 02/05/22 ?0355  ?WBC 22.9* 18.4*  ?HGB 11.3* 10.8*  ?HCT 34.6* 33.9*  ?PLT 333 223  ? ?BMET:  ?Recent Labs  ?  02/04/22 ?1216 02/05/22 ?0355  ?NA 144 142  ?K 3.5 3.9  ?CL 108 105  ?CO2 24 28  ?GLUCOSE 139* 194*  ?BUN 19 27*  ?CREATININE 1.20* 1.04*  ?CALCIUM 8.2* 8.0*  ?  ?PT/INR:  ?Recent Labs  ?  02/02/22 ?2321  ?LABPROT 12.2  ?INR 0.9  ? ?ABG ?   ?Component Value Date/Time  ? PHART 7.352 02/03/2022 2041  ? HCO3 21.6 02/03/2022 2041  ? TCO2 23 02/03/2022 2041  ? ACIDBASEDEF 4.0 (H) 02/03/2022 2041  ? O2SAT 62 02/05/2022 0355  ? ?CBG (last 3)  ?Recent Labs  ?   02/05/22 ?0401 02/05/22 ?0723 02/05/22 ?1139  ?GLUCAP 181* 149* 124*  ? ?CXR: improved venous congestion, pulm edema resolved. May be small pleural effusions. ? ?Assessment/Plan: ? ?She remains hemodynamically stable with normal filling pressures. Renal function normal. Will plan CABG and MVR tomorrow. I discussed the operative procedure with her husband today including alternatives, benefits and risks; including but not limited to bleeding, blood transfusion, infection, stroke, myocardial infarction, graft failure, heart block requiring a permanent pacemaker, organ dysfunction, and death. He agrees to proceed. Plan surgeyr in am. ?   ? LOS: 2 days  ? ? ?[name] ?02/05/2022 ? ? ?

## 2022-02-05 NOTE — Progress Notes (Addendum)
? ? Advanced Heart Failure Rounding Note ? ?PCP-Cardiologist: None  ? ?Subjective:   ?76 y/o with severe CAD/NSTEMI and iCM EF 30-35%. Developed refractory angina and shock yesterday with respiratory failure. Intubated and IABP placed.  ? ?4/5 IABP + Norep + Milrinone. Diursed with lasix 4 mg.  Negative 1.2 liters.  ? ?CXR looks better today.  ? ?Remains on Norepi 1 + milrinone 0.125 mcg + IABP 1:1 ? ?CO-OX 62%.  ?[place] #s ?CVP 9 ?PA 23/20 ?CO 3.6 ?CI 2.2  ? ?Remains intubated. Awake follows commands.  ? ?Objective:   ?Weight Range: ?59.7 kg ?Body mass index is 22.59 kg/m?.  ? ?Vital Signs:   ?Temp:  [98.4 ?F (36.9 ?C)-99.3 ?F (37.4 ?C)] 99.1 ?F (37.3 ?C) (04/06 0730) ?Pulse Rate:  [63-269] 70 (04/06 0730) ?Resp:  [14-21] 16 (04/06 0730) ?BP: (92-137)/(41-86) 103/61 (04/06 0700) ?SpO2:  [93 %-100 %] 97 % (04/06 0730) ?Arterial Line BP: (97-144)/(31-70) 134/36 (04/06 0730) ?FiO2 (%):  [40 %-60 %] 40 % (04/06 0730) ?Weight:  [59.7 kg] 59.7 kg (04/06 0500) ?Last BM Date : 02/03/22 ? ?Weight change: ?Filed Weights  ? 02/03/22 0600 02/04/22 0500 02/05/22 0500  ?Weight: 59.9 kg 59.4 kg 59.7 kg  ? ? ?Intake/Output:  ? ?Intake/Output Summary (Last 24 hours) at 02/05/2022 0755 ?Last data filed at 02/05/2022 0700 ?Gross per 24 hour  ?Intake 1326.77 ml  ?Output 2625 ml  ?Net -1298.23 ml  ?  ? ? ?Physical Exam  ? CVP 8-9 ?General:  Intubated  ?HEENT: ETT ?Neck: Supple. JVP 8 . Carotids 2+ bilat; no bruits. No lymphadenopathy or thyromegaly appreciated. ?Cor: PMI nondisplaced. Regular rate & rhythm. No rubs, gallops or murmurs. ?Lungs: Clear ?Abdomen: Soft, nontender, nondistended. No hepatosplenomegaly. No bruits or masses. Good bowel sounds. ?Extremities: No cyanosis, clubbing, rash, edema. L groin IABP ?Neuro: Intubated Awake follows commands. MAE x4.  ? ?Telemetry  ? ?SR 70-80s personally checked.  ? ?EKG  ? N/A  ?Labs  ?  ?CBC ?Recent Labs  ?  02/02/22 ?2321 02/03/22 ?6063 02/04/22 ?0300 02/05/22 ?0355  ?WBC 23.6*   < > 22.9*  18.4*  ?NEUTROABS 21.0*  --   --   --   ?HGB 11.9*   < > 11.3* 10.8*  ?HCT 36.6   < > 34.6* 33.9*  ?MCV 86.7   < > 87.2 88.7  ?PLT 395   < > 333 223  ? < > = values in this interval not displayed.  ? ?Basic Metabolic Panel ?Recent Labs  ?  02/04/22 ?1216 02/05/22 ?0355  ?NA 144 142  ?K 3.5 3.9  ?CL 108 105  ?CO2 24 28  ?GLUCOSE 139* 194*  ?BUN 19 27*  ?CREATININE 1.20* 1.04*  ?CALCIUM 8.2* 8.0*  ?MG 1.8 2.1  ?PHOS 3.4 3.3  ? ?Liver Function Tests ?Recent Labs  ?  02/04/22 ?1216 02/05/22 ?0355  ?AST 97* 61*  ?ALT 43 36  ?ALKPHOS 63 58  ?BILITOT 1.0 0.5  ?PROT 6.0* 5.8*  ?ALBUMIN 2.8* 2.7*  ? ?No results for input(s): LIPASE, AMYLASE in the last 72 hours. ?Cardiac Enzymes ?No results for input(s): CKTOTAL, CKMB, CKMBINDEX, TROPONINI in the last 72 hours. ? ?BNP: ?BNP (last 3 results) ?Recent Labs  ?  02/02/22 ?2321  ?BNP 366.4*  ? ? ?ProBNP (last 3 results) ?No results for input(s): PROBNP in the last 8760 hours. ? ? ?D-Dimer ?No results for input(s): DDIMER in the last 72 hours. ?Hemoglobin A1C ?Recent Labs  ?  02/04/22 ?0300  ?HGBA1C 5.7*  ? ?  Fasting Lipid Panel ?Recent Labs  ?  02/04/22 ?0300  ?CHOL 151  ?HDL 39*  ?[place] 95  ?TRIG 83  ?CHOLHDL 3.9  ? ?Thyroid Function Tests ?Recent Labs  ?  02/03/22 ?1342  ?TSH 2.059  ? ? ?Other results: ? ? ?Imaging  ? ? ?No results found. ? ? ?Medications:   ? ? ?Scheduled Medications: ? aspirin  81 mg Per Tube Daily  ? cefdinir  300 mg Per Tube BID  ? chlorhexidine gluconate (MEDLINE KIT)  15 mL Mouth Rinse BID  ? Chlorhexidine Gluconate Cloth  6 each Topical Daily  ? docusate  100 mg Per Tube BID  ? [START ON 02/06/2022] epinephrine  0-10 mcg/min Intravenous To OR  ? feeding supplement (PROSource TF)  45 mL Per Tube BID  ? feeding supplement (VITAL HIGH PROTEIN)  1,000 mL Per Tube Q24H  ? [START ON 02/06/2022] heparin-papaverine-plasmalyte irrigation   Irrigation To OR  ? insulin aspart  0-15 Units Subcutaneous Q4H  ? [START ON 02/06/2022] insulin   Intravenous To OR  ?  levothyroxine  25 mcg Per Tube Q0600  ? [START ON 02/06/2022] magnesium sulfate  40 mEq Other To OR  ? mouth rinse  15 mL Mouth Rinse 10 times per day  ? pantoprazole (PROTONIX) IV  40 mg Intravenous Daily  ? [START ON 02/06/2022] phenylephrine  30-200 mcg/min Intravenous To OR  ? polyethylene glycol  17 g Per Tube Daily  ? [START ON 02/06/2022] potassium chloride  80 mEq Other To OR  ? rosuvastatin  10 mg Per Tube Daily  ? sodium chloride flush  3 mL Intravenous Q12H  ? [START ON 02/06/2022] tranexamic acid  15 mg/kg Intravenous To OR  ? [START ON 02/06/2022] tranexamic acid  2 mg/kg Intracatheter To OR  ? trimethoprim  100 mg Per Tube Daily  ? ? ?Infusions: ? sodium chloride    ? sodium chloride 10 mL/hr at 02/04/22 1500  ? sodium chloride Stopped (02/04/22 0023)  ? [START ON 02/06/2022]  ceFAZolin (ANCEF) IV    ? [START ON 02/06/2022]  ceFAZolin (ANCEF) IV    ? dexmedetomidine (PRECEDEX) IV infusion 0.6 mcg/kg/hr (02/05/22 0725)  ? [START ON 02/06/2022] dexmedetomidine    ? fentaNYL infusion INTRAVENOUS Stopped (02/04/22 1600)  ? furosemide (LASIX) 200 mg in dextrose 5% 100 mL (75m/mL) infusion 4 mg/hr (02/05/22 0726)  ? [START ON 02/06/2022] heparin 30,000 units/NS 1000 mL solution for CELLSAVER    ? heparin 800 Units/hr (02/05/22 0700)  ? milrinone 0.125 mcg/kg/min (02/05/22 0700)  ? [START ON 02/06/2022] milrinone    ? [START ON 02/06/2022] nitroGLYCERIN    ? norepinephrine (LEVOPHED) Adult infusion 2 mcg/min (02/05/22 0700)  ? [START ON 02/06/2022] norepinephrine    ? [START ON 02/06/2022] tranexamic acid (CYKLOKAPRON) infusion ([facility])    ? [START ON 02/06/2022] vancomycin    ? ? ?PRN Medications: ?acetaminophen (TYLENOL) oral liquid 160 mg/5 mL, albuterol, fentaNYL, fentaNYL (SUBLIMAZE) injection, midazolam, midazolam, ondansetron (ZOFRAN) IV, polyethylene glycol ? ? ? ?Patient Profile  ?76y/o with severe CAD/NSTEMI and iCM EF 30-35%. Developed refractory angina and shock yesterday with respiratory failure. Intubated and IABP placed.  Pending CABG on Friday ?Assessment/Plan  ? ?1. NSTEMI---> Cardiogenic Shock ?- HS Trop [id number] ?Cath with 3V disease, elevated wedge, and reduced cardiac output. IABP placed in lab with Norepi/Milrinone.  ?-IABP 1:1 +  milrinone 0.125 mcg+ norepi 1.   ?- Hemodynamic stable. CO-OX 62%. Continue current regimen.  ?-Needs additional diuresis prior to CABG ?-  CT surgery consulted. Plan for CABG/MVR  Friday.   ?  ?2. Acute Hypoxemic Respiratory Failure ?-Intubated in the cath lab. CXR improving.  ?-Vent management per CCM. No plan to extubate prior to surgery.  ?  ?3. Acute HFrEF, ICM  ?-Echo EF 30-35% RV ok. Severe MR  ?-Continue current support as above.  ?- No room for GDMT with shock  ?  ?4. Severe MR  ?-Plan for CABG/MVR Fri ?  ?5. Hypothyroidism  ?-On levothyroxine.  ?  ?6. AKI ? - Creatinine trending down.  ?- Follow BMET daily  ? ?Length of Stay: 2 ? ?Darrick Grinder, NP  ?02/05/2022, 7:55 AM ? ?Advanced Heart Failure Team ?Pager [phone] (M-F; 7a - 5p)  ?Please contact [facility] for night-coverage after hours (5p -7a ) and weekends on [email or web address] ? ?Agree with above.  ? ?Failed SBT several times yesterday due to anxiety. Remains on milrinone, NE and IABP 1:1.  ? ?Awake on vent. Denies CP ? ?General:  Awake on vent. Follows commands ?HEENT: normal + ETT ?Neck: supple. JVP 7-8. Carotids 2+ bilat; no bruits. No lymphadenopathy or thryomegaly appreciated. ?Cor: PMI nondisplaced. Regular rate & rhythm. No rubs, gallops or murmurs. ?Lungs: clear ?Abdomen: soft, nontender, nondistended. No hepatosplenomegaly. No bruits or masses. Good bowel sounds. ?Extremities: no cyanosis, clubbing, rash, edema + LFA IABP ?Neuro: awake communicative cranial nerves grossly intact. moves all 4 extremities w/o difficulty. Affect pleasant ? ?Failed vent wean yesterday. Hemodynamics stable on current regimen. Rhythm stable. Renal function improved.  ? ?Will leave intubated prior to CABG/MVR tomorrow.  ? ?CRITICAL CARE ?Performed by:  Glori Bickers ? ?Total critical care time: 35 minutes ? ?Critical care time was exclusive of separately billable procedures and treating other patients. ? ?Critical care was necessary to treat or prevent imminent

## 2022-02-05 NOTE — Progress Notes (Signed)
Initial Nutrition Assessment ? ?DOCUMENTATION CODES:  ? ?Not applicable ? ?INTERVENTION:  ? ?Tube Feeding via OG:  ?Vital AF 1.2 at 55 ml/hr ?This provides 1584 kcals, 99 g of protein 1069 mL of free water ? ? ?NUTRITION DIAGNOSIS:  ? ?Inadequate oral intake related to acute illness as evidenced by NPO status. ? ?GOAL:  ? ?Patient will meet greater than or equal to 90% of their needs ? ?MONITOR:  ? ?Vent status, Labs, Weight trends, TF tolerance ? ?REASON FOR ASSESSMENT:  ? ?Ventilator, Consult ?Enteral/tube feeding initiation and management ? ?ASSESSMENT:  ? ?76 yo female admitted with NSTEMI with acute cardiogenic shock, acute respiratory failure requiring intubation. PMH includes HTN, CAD, ischemic CM, diverticulitis, GERD ? ?4/04 Cath Lab: severe 3V CAD, Intubated ?4/05 IABP ? ?Plan for CABG and MVR tomorrow ? ?Pt on vent support, arousable and able to nod head yes/no to some questions. Husband at bedside ?Pt on levophed (1 mcg/min), milrinone  ? ?OG tube in gastric antrum per abd xray. Possible Cortrak tomorrow pending post-op course ? ?Vital High Protein at 40 ml/hr infusing via OG tube ? ?Husband reports pt eats well at home. UBW between 120-130 pounds, Current wt 131 pounds. Weight appears relatively stable per weight encounters. Very mild edema on exam ? ?Labs: reviewed ?Meds: insulin gtt, mag sulfate, colace, miralax, lasix ? ? ?NUTRITION - FOCUSED PHYSICAL EXAM: ? ?Flowsheet Row Most Recent Value  ?Orbital Region No depletion  ?Upper Arm Region Mild depletion  ?Thoracic and Lumbar Region No depletion  ?Buccal Region Unable to assess  ?Temple Region Mild depletion  ?Clavicle Bone Region No depletion  ?Clavicle and Acromion Bone Region No depletion  ?Scapular Bone Region No depletion  ?Dorsal Hand Mild depletion  ?Patellar Region No depletion  ?Anterior Thigh Region No depletion  ?Posterior Calf Region No depletion  ?Edema (RD Assessment) Mild  ? ?  ? ? ?Diet Order:   ?Diet Order   ? ?       ?  Diet NPO  time specified  Diet effective midnight       ?  ?  Diet NPO time specified  Diet effective now       ?  ? ?  ?  ? ?  ? ? ?EDUCATION NEEDS:  ? ?Not appropriate for education at this time ? ?Skin:  Skin Assessment: Reviewed RN Assessment ? ?Last BM:  4/4 ? ?Height:  ? ?Ht Readings from Last 1 Encounters:  ?02/03/22 '5\' 4"'$  (1.626 m)  ? ? ?Weight:  ? ?Wt Readings from Last 1 Encounters:  ?02/05/22 59.7 kg  ? ? ?BMI:  Body mass index is 22.59 kg/m?. ? ?Estimated Nutritional Needs:  ? ?Kcal:  1550-1750 kcals ? ?Protein:  85-100 g ? ?Fluid:  >/= 1.5 L ? ? ?Kerman Passey MS, RDN, LDN, CNSC ?Registered Dietitian III ?Clinical Nutrition ?RD Pager and On-Call Pager Number Located in [place]  ? ?

## 2022-02-05 NOTE — Progress Notes (Signed)
2 Days Post-Op Procedure(s) (LRB): ?RIGHT/LEFT HEART CATH AND CORONARY ANGIOGRAPHY (N/A) ?IABP Insertion (N/A) ?Subjective: ? ?Remains intubated on vent. FiO2 down to 40%. ? ?CI 2.1 with Co-ox 62% on milrinone 0.125 and NE 4 ? ?UO adequate on lasix drip 4 ? ?Objective: ?Vital signs in last 24 hours: ?Temp:  [98.4 ?F (36.9 ?C)-99.7 ?F (37.6 ?C)] 99.1 ?F (37.3 ?C) (04/06 1300) ?Pulse Rate:  [63-218] 91 (04/06 1201) ?Cardiac Rhythm: Normal sinus rhythm (04/06 0730) ?Resp:  [14-21] 14 (04/06 1300) ?BP: (92-137)/(32-77) 137/49 (04/06 1300) ?SpO2:  [93 %-100 %] 98 % (04/06 1300) ?Arterial Line BP: (97-153)/(29-72) 145/47 (04/06 1300) ?FiO2 (%):  [40 %] 40 % (04/06 1200) ?Weight:  [59.7 kg] 59.7 kg (04/06 0500) ? ?Hemodynamic parameters for last 24 hours: ?PAP: (15-39)/(10-22) 21/17 ?CVP:  [3 mmHg-12 mmHg] 5 mmHg ?CO:  [3.2 L/min-4.5 L/min] 3.5 L/min ?CI:  [2 L/min/m2-2.8 L/min/m2] 2.1 L/min/m2 ? ?Intake/Output from previous day: ?04/05 0701 - 04/06 0700 ?In: 1326.8 [I.V.:531.4; NG/GT:750; IV Piggyback:45.4] ?Out: 2625 [Urine:2625] ?Intake/Output this shift: ?Total I/O ?In: 694 [I.V.:134; NG/GT:360] ?Out: 600 [Urine:600] ? ?General appearance: alert and cooperative, intubated on vent ?Neurologic: intact ?Heart: regular rate and rhythm ?Lungs: clear to auscultation bilaterally ?Extremities: warm, no edema ? ? ?Lab Results: ?Recent Labs  ?  02/04/22 ?0300 02/05/22 ?0355  ?WBC 22.9* 18.4*  ?HGB 11.3* 10.8*  ?HCT 34.6* 33.9*  ?PLT 333 223  ? ?BMET:  ?Recent Labs  ?  02/04/22 ?1216 02/05/22 ?0355  ?NA 144 142  ?K 3.5 3.9  ?CL 108 105  ?CO2 24 28  ?GLUCOSE 139* 194*  ?BUN 19 27*  ?CREATININE 1.20* 1.04*  ?CALCIUM 8.2* 8.0*  ?  ?PT/INR:  ?Recent Labs  ?  02/02/22 ?2321  ?LABPROT 12.2  ?INR 0.9  ? ?ABG ?   ?Component Value Date/Time  ? PHART 7.352 02/03/2022 2041  ? HCO3 21.6 02/03/2022 2041  ? TCO2 23 02/03/2022 2041  ? ACIDBASEDEF 4.0 (H) 02/03/2022 2041  ? O2SAT 62 02/05/2022 0355  ? ?CBG (last 3)  ?Recent Labs  ?   02/05/22 ?0401 02/05/22 ?0723 02/05/22 ?1139  ?GLUCAP 181* 149* 124*  ? ?CXR: improved venous congestion, pulm edema resolved. May be small pleural effusions. ? ?Assessment/Plan: ? ?She remains hemodynamically stable with normal filling pressures. Renal function normal. Will plan CABG and MVR tomorrow. I discussed the operative procedure with her husband today including alternatives, benefits and risks; including but not limited to bleeding, blood transfusion, infection, stroke, myocardial infarction, graft failure, heart block requiring a permanent pacemaker, organ dysfunction, and death. He agrees to proceed. Plan surgeyr in am. ?   ? LOS: 2 days  ? ? ?Tonya Hamilton ?02/05/2022 ? ? ?

## 2022-02-05 NOTE — Progress Notes (Signed)
? ?NAME:  Tonya Hamilton, MRN:  [id number], DOB:  [birth year], LOS: 2 ?ADMISSION DATE:  02/02/2022, CONSULTATION DATE:  02/03/22 ?REFERRING MD:  Christy Gentles CHIEF COMPLAINT:  Dyspnea  ? ?History of Present Illness:  ?Tonya Hamilton is a 76 y.o. female who has a PMH as outlined below.  She presented to [facility] ED 4/3 with dyspnea.  She states that symptoms began suddenly and progressed.  She also endorsed cough intimately productive of sputum.  No fevers/chills/sweats, chest pain, N/V/D, abdominal pain, myalgias, exposures known sick contacts, recent travel. ? ?In ED, she was found to be hypoxic.  CXR showed bilateral opacities and bilateral mild edema.  Troponin was elevated at 708 with repeat up to 1454.  BNP mildly elevated at 366.  EKG with ST depressions and global ischemia.  Leukocytosis to 23.6.  She was initially placed on Ventimask but was later upgraded to BiPAP.  She continued to require BiPAP which she did feel subjective improvement in symptoms.  Patient stable subsequently called for transfer to ICU at [facility] for further evaluation management. ? ?Pertinent  Medical History:  ?has History of colonic diverticulitis; Osteoarthritis; Hypertension; Hyperlipidemia; Hypothyroid; GERD (gastroesophageal reflux disease); Osteopenia; Anxiety; Osteoarthritis of right hip; Angioedema; Chronic intractable headache; Occipital neuralgia of left side; Neck pain; Hyperreflexia; Arthritis, multiple joint involvement; Spasmodic dysphonia; Acute respiratory failure ([place]); Respiratory failure ([place]); Community acquired pneumonia; Non-STEMI (non-ST elevated myocardial infarction) ([place]); Cardiogenic shock ([place]); Severe mitral regurgitation; Ischemic cardiomyopathy; and Multiple vessel coronary artery disease on their problem list. ? ?Significant Hospital Events: ?Including procedures, antibiotic start and stop dates in addition to other pertinent events   ?4/4 admit, started on Cefepime and Azith and Bipap.  Trop rising, on  heparin gtt,  Echo with reduced EF and regional wall motion abnormalities with severe MR, cardiology consulted and taken to cath lab which revealed severe LM and 3V disease.  IABP placed and intubated, in cardiogenic shock ?4/6 Lying in bed on IABP with progressive stability seen, plan for CABG with MVR tomorrow  ? ?Studies: ?Echo 4/5>  ?The inferior wall and posterior wall are akinetic. The antero-lateral wall and entire apex are hypokinetic. The anterior wall, anterior septum, mid inferoseptal segment, and basal inferoseptal segment are normal.  ?Right Ventricle: The right ventricular size is normal. No increase in right ventricular wall thickness. Right ventricular systolic function is normal. There is moderately elevated pulmonary artery systolic pressure. The tricuspid regurgitant velocity is 2.96 m/s, and with an assumed right atrial pressure of 15 mmHg, the estimated right ventricular systolic pressure is 63.8 mmHg.  ? ?Abx ?Cefdinir 4/4- ? ?Interim History / Subjective:  ?Will arouse on vent and able to follow simple commands  ? ?Objective:  ?Blood pressure 103/61, pulse 69, temperature 99.1 ?F (37.3 ?C), resp. rate 16, height '5\' 4"'$  (1.626 m), weight 59.7 kg, SpO2 94 %. ?PAP: (15-25)/(8-20) 21/18 ?CVP:  [2 mmHg-9 mmHg] 8 mmHg ?PCWP:  [12 mmHg] 12 mmHg ?CO:  [3.2 L/min-4.6 L/min] 3.3 L/min ?CI:  [2 L/min/m2-2.8 L/min/m2] 2 L/min/m2  ?Vent Mode: PRVC ?FiO2 (%):  [40 %-60 %] 40 % ?Set Rate:  [16 bmp-20 bmp] 16 bmp ?Vt Set:  [440 mL] 440 mL ?PEEP:  [5 cmH20-8 cmH20] 5 cmH20 ?Pressure Support:  [8 cmH20] 8 cmH20 ?Plateau Pressure:  [14 cmH20-17 cmH20] 15 cmH20  ? ?Intake/Output Summary (Last 24 hours) at 02/05/2022 0720 ?Last data filed at 02/05/2022 0700 ?Gross per 24 hour  ?Intake 1326.77 ml  ?Output 2625 ml  ?Net -1298.23 ml  ? ? ?  Filed Weights  ? 02/03/22 0600 02/04/22 0500 02/05/22 0500  ?Weight: 59.9 kg 59.4 kg 59.7 kg  ? ?Physical Exam  ?General: Acute on chronically ill appearing elderly female lying in bed  on mechanical ventilation, in NAD ?HEENT: ETT, MM pink/moist, PERRL,  ?Neuro: Will arouse to verbal stimuli and able to follow simple commands  ?CV: s1s2 regular rate and rhythm, no murmur, rubs, or gallops,  ?PULM:  Clear to ascultation, no increased work of breathing, tolerating vent  ?GI: soft, bowel sounds active in all 4 quadrants, non-tender, non-distended, tolerating TF ?Extremities: warm/dry, no edema  ?Skin: no rashes or lesions ? ?Assessment & Plan:  ? ?CAD and NSTEMI with acute cardiogenic shock  ?-Reported increased exertional dyspnea for the last month. ?To Cath lab and Intubated 4/4. Severe 3 vessel disease with occluded Lcx-OM ?-S/p IABP ?P:  ?CTS and HF following, appreciate assistance  ?Strict intake and output  ?Continue low dose lasix drip  ?Closely monitor renal function and electrolytes  ?Vent support as above  ?Continue pressors and milrinone  ?CABG and MVR planned 4/7 ?Trend COOX per cardiology  ?Continue heparin drip  ? ?Acute Hypoxic Respiratory Failure  ?-Secondary to pulmonary edema  ?P: ?Continue ventilator support with lung protective strategies  ?Wean PEEP and FiO2 for sats greater than 90%. ?Head of bed elevated 30 degrees. ?Plateau pressures less than 30 cm H20.  ?Follow intermittent chest x-ray and ABG.   ?SAT/SBT as tolerated, mentation preclude extubation  ?Ensure adequate pulmonary hygiene  ?Follow cultures  ?VAP bundle in place  ?PAD protocol ?Continue lasix drip  ? ?Hx frequent/chronic UTIs  ?- on daily Bactrim as prophylaxis ?-UA with trace leukocytes and rare bacteria 4/4 ?P: ?Continue home bactrim ? ?Hx hypothyroidism ?P: ?Continue home synthroid  ? ?Hx HTN, HLD ?-Home medications includes Amlodipine ?P: ?Hold home Amlodipine until appropriate  ?Continuous telemetry  ? ?Best practice (evaluated daily):  ?Diet/type: Regular consistency (see orders) ?DVT prophylaxis: systemic heparin ?GI prophylaxis: N/A ?Lines: N/A ?Foley:  Yes, and it is still needed ?Code Status:  full  code ?Last date of multidisciplinary goals of care discussion: Continue to update family daily, no one at bedside on my assessment will update later  ? ?Critical care time:  37 min.  ?Jeremaih Klima D. Harris, NP-C ?[facility] ?Personal contact information can be found on Amion  ?02/05/2022, 7:29 AM ? ? ? ? ?

## 2022-02-05 NOTE — Progress Notes (Signed)
ANTICOAGULATION CONSULT NOTE ?Pharmacy Consult for Heparin ?Indication: chest pain/ACS/ IABP ?Brief A/P: Heparin level subtherapeutic Increase Heparin rate ? ?Allergies  ?Allergen Reactions  ? Botox [Onabotulinumtoxina]   ?  Was given too much botox and at night, felt like her throat was closing up.   ? Diclofenac Other (See Comments)  ?  Unknown reaction per pt and SO  ? Lisinopril Swelling  ? Simvastatin Other (See Comments)  ?  myalgias  ? Statins Other (See Comments)  ?  myalgias  ? Sulfa Antibiotics Itching  ? ? ?Patient Measurements: ?Height: '5\' 4"'$  (162.6 cm) ?Weight: 59.4 kg (130 lb 15.3 oz) ?IBW/kg (Calculated) : 54.7 ?Heparin Dosing Weight: 60 kg ? ?Vital Signs: ?Temp: 98.6 ?F (37 ?C) (04/06 0000) ?Temp Source: Core (04/06 0000) ?BP: 111/72 (04/06 0000) ?Pulse Rate: 70 (04/06 0000) ? ?Labs: ?Recent Labs  ?  02/02/22 ?2321 02/03/22 ?0210 02/03/22 ?5320 02/03/22 ?0601 02/03/22 ?1655 02/03/22 ?1658 02/03/22 ?2041 02/04/22 ?0300 02/04/22 ?1216 02/05/22 ?0120  ?HGB 11.9*  --    < > 12.7   < > 11.6* 11.9* 11.3*  --   --   ?HCT 36.6  --    < > 39.6   < > 34.0* 35.0* 34.6*  --   --   ?PLT 395  --   --  402*  --   --   --  333  --   --   ?APTT 28  --   --   --   --   --   --   --   --   --   ?LABPROT 12.2  --   --   --   --   --   --   --   --   --   ?INR 0.9  --   --   --   --   --   --   --   --   --   ?HEPARINUNFRC  --   --   --   --   --   --   --  0.64 0.15* <0.10*  ?CREATININE 0.73  --   --  0.85  --   --   --  1.25* 1.20*  --   ?TROPONINIHS 708* 1,454*  --  6,513*  --   --   --   --   --   --   ? < > = values in this interval not displayed.  ? ? ? ?Estimated Creatinine Clearance: 34.4 mL/min (A) (by C-G formula based on SCr of 1.2 mg/dL (H)). ? ?Assessment: ?76 y.o. female with NSTEMI s/p cath, on IABP and awaiting CABG/MVR, for heparin ? ?Goal of Therapy:  ?Heparin level 0.2-0.5 units/ml ?Monitor platelets by anticoagulation protocol: Yes ?  ?Plan:  ?Increase Heparin 800 units/hr ?Check heparin level in 8  hours. ? ?Phillis Knack, PharmD, BCPS ? ? ? ? ? ? ?

## 2022-02-05 NOTE — Progress Notes (Addendum)
ANTICOAGULATION CONSULT NOTE ? ?Pharmacy Consult for Heparin ?Indication: chest pain/ACS/ IABP ? ?Allergies  ?Allergen Reactions  ? Botox [Onabotulinumtoxina]   ?  Was given too much botox and at night, felt like her throat was closing up.   ? Diclofenac Other (See Comments)  ?  Unknown reaction per pt and SO  ? Lisinopril Swelling  ? Simvastatin Other (See Comments)  ?  myalgias  ? Statins Other (See Comments)  ?  myalgias  ? Sulfa Antibiotics Itching  ? ? ?Patient Measurements: ?Height: '5\' 4"'$  (162.6 cm) ?Weight: 59.7 kg (131 lb 9.8 oz) ?IBW/kg (Calculated) : 54.7 ?Heparin Dosing Weight: 60 kg ? ?Vital Signs: ?Temp: 99.1 ?F (37.3 ?C) (04/06 1201) ?Temp Source: Core (04/06 1200) ?BP: 116/32 (04/06 1119) ?Pulse Rate: 91 (04/06 1201) ? ?Labs: ?Recent Labs  ?  02/02/22 ?2321 02/03/22 ?0210 02/03/22 ?5102 02/03/22 ?0601 02/03/22 ?1655 02/03/22 ?2041 02/03/22 ?2041 02/04/22 ?0300 02/04/22 ?1216 02/05/22 ?0120 02/05/22 ?0355 02/05/22 ?5852  ?HGB 11.9*  --    < > 12.7   < > 11.9*  --  11.3*  --   --  10.8*  --   ?HCT 36.6  --    < > 39.6   < > 35.0*  --  34.6*  --   --  33.9*  --   ?PLT 395  --   --  402*  --   --   --  333  --   --  223  --   ?APTT 28  --   --   --   --   --   --   --   --   --   --   --   ?LABPROT 12.2  --   --   --   --   --   --   --   --   --   --   --   ?INR 0.9  --   --   --   --   --   --   --   --   --   --   --   ?HEPARINUNFRC  --   --   --   --   --   --    < > 0.64 0.15* <0.10*  --  0.11*  ?CREATININE 0.73  --   --  0.85  --   --   --  1.25* 1.20*  --  1.04*  --   ?TROPONINIHS 708* 1,454*  --  6,513*  --   --   --   --   --   --   --   --   ? < > = values in this interval not displayed.  ? ? ? ?Estimated Creatinine Clearance: 39.7 mL/min (A) (by C-G formula based on SCr of 1.04 mg/dL (H)). ? ? ?Assessment: ?76 y.o. F presents with cough/SOB. Trop elevated to 1454. To begin heparin for ACS. No AC PTA. CBC ok on admission. Heparin changed to enoxaparin 4/4 pre-cath > in cath lab was intubated and  IABP placed. TCTS will plan for CABG/MV replacement if pt can stabilize on IABP, inotropic support. Heparin gtt restarted post-cath Pt also on tirofiban x 18 hours. ? ?Initial Heparin level supratherapeutic (0.64) probably interference from enoxaparin > heparin dose decreased 800 uts/hr heparin level now low 0.11  ?for lower goal with IABP. No issues with bleeding per RN. ? ?Goal of Therapy:  ?Heparin level 0.2-0.5 units/ml ?Monitor platelets by anticoagulation protocol: Yes ?  ?  Plan:  ?Increase heparin gtt to 900 units/hr ?Plan to turn heparin off prior to OR in am  ? ? ?Bonnita Nasuti Pharm.D. CPP, BCPS ?Clinical Pharmacist ?[phone] ?02/05/2022 12:20 PM  ? ? ? ? ? ? ?

## 2022-02-06 ENCOUNTER — Inpatient Hospital Stay (HOSPITAL_COMMUNITY): Payer: Medicare HMO

## 2022-02-06 ENCOUNTER — Inpatient Hospital Stay (HOSPITAL_COMMUNITY): Payer: Medicare HMO | Admitting: Anesthesiology

## 2022-02-06 ENCOUNTER — Inpatient Hospital Stay (HOSPITAL_COMMUNITY)
Admission: EM | Disposition: A | Discharge status: Home / Self Care | Payer: Self-pay | Source: Home / Self Care | Attending: Surgery

## 2022-02-06 ENCOUNTER — Other Ambulatory Visit: Payer: Self-pay

## 2022-02-06 DIAGNOSIS — I10 Essential (primary) hypertension: Secondary | ICD-10-CM

## 2022-02-06 DIAGNOSIS — R570 Cardiogenic shock: Secondary | ICD-10-CM | POA: Diagnosis not present

## 2022-02-06 DIAGNOSIS — I25119 Atherosclerotic heart disease of native coronary artery with unspecified angina pectoris: Secondary | ICD-10-CM

## 2022-02-06 DIAGNOSIS — Z951 Presence of aortocoronary bypass graft: Secondary | ICD-10-CM

## 2022-02-06 DIAGNOSIS — I252 Old myocardial infarction: Secondary | ICD-10-CM

## 2022-02-06 DIAGNOSIS — J9601 Acute respiratory failure with hypoxia: Secondary | ICD-10-CM | POA: Diagnosis not present

## 2022-02-06 DIAGNOSIS — I340 Nonrheumatic mitral (valve) insufficiency: Secondary | ICD-10-CM

## 2022-02-06 DIAGNOSIS — I2510 Atherosclerotic heart disease of native coronary artery without angina pectoris: Secondary | ICD-10-CM

## 2022-02-06 HISTORY — PX: MITRAL VALVE REPLACEMENT: SHX147

## 2022-02-06 HISTORY — PX: CORONARY ARTERY BYPASS GRAFT: SHX141

## 2022-02-06 HISTORY — PX: TEE WITHOUT CARDIOVERSION: SHX5443

## 2022-02-06 LAB — COOXEMETRY PANEL
Carboxyhemoglobin: 1.5 % (ref 0.5–1.5)
Carboxyhemoglobin: 1.3 % (ref 0.5–1.5)
Methemoglobin: <0.7 % (ref 0.0–1.5)
Methemoglobin: 0.7 % (ref 0.0–1.5)
O2 Saturation: 58.4 %
O2 Saturation: 57.6 %
Total hemoglobin: 10 g/dL — ABNORMAL LOW (ref 12.0–16.0)
Total hemoglobin: 9.3 g/dL — ABNORMAL LOW (ref 12.0–16.0)

## 2022-02-06 LAB — CBC
HCT: 30 % — ABNORMAL LOW (ref 36.0–46.0)
HCT: 30.2 % — ABNORMAL LOW (ref 36.0–46.0)
HCT: 27.7 % — ABNORMAL LOW (ref 36.0–46.0)
Hemoglobin: 9.6 g/dL — ABNORMAL LOW (ref 12.0–15.0)
Hemoglobin: 9.7 g/dL — ABNORMAL LOW (ref 12.0–15.0)
Hemoglobin: 8.9 g/dL — ABNORMAL LOW (ref 12.0–15.0)
MCH: 28.1 pg (ref 26.0–34.0)
MCH: 28.4 pg (ref 26.0–34.0)
MCH: 28.8 pg (ref 26.0–34.0)
MCHC: 32 g/dL (ref 30.0–36.0)
MCHC: 32.1 g/dL (ref 30.0–36.0)
MCHC: 32.1 g/dL (ref 30.0–36.0)
MCV: 87.7 fL (ref 80.0–100.0)
MCV: 88.6 fL (ref 80.0–100.0)
MCV: 89.6 fL (ref 80.0–100.0)
Platelets: 178 10*3/uL (ref 150–400)
Platelets: 113 10*3/uL — ABNORMAL LOW (ref 150–400)
Platelets: 120 10*3/uL — ABNORMAL LOW (ref 150–400)
RBC: 3.42 MIL/uL — ABNORMAL LOW (ref 3.87–5.11)
RBC: 3.41 MIL/uL — ABNORMAL LOW (ref 3.87–5.11)
RBC: 3.09 MIL/uL — ABNORMAL LOW (ref 3.87–5.11)
RDW: 14.6 % (ref 11.5–15.5)
RDW: 14.3 % (ref 11.5–15.5)
RDW: 14.5 % (ref 11.5–15.5)
WBC: 15.3 10*3/uL — ABNORMAL HIGH (ref 4.0–10.5)
WBC: 19.6 10*3/uL — ABNORMAL HIGH (ref 4.0–10.5)
WBC: 17.7 10*3/uL — ABNORMAL HIGH (ref 4.0–10.5)
nRBC: 0 % (ref 0.0–0.2)
nRBC: 0 % (ref 0.0–0.2)
nRBC: 0 % (ref 0.0–0.2)

## 2022-02-06 LAB — BASIC METABOLIC PANEL
Anion gap: 5 (ref 5–15)
BUN: 24 mg/dL — ABNORMAL HIGH (ref 8–23)
CO2: 24 mmol/L (ref 22–32)
Calcium: 7.2 mg/dL — ABNORMAL LOW (ref 8.9–10.3)
Chloride: 111 mmol/L (ref 98–111)
Creatinine, Ser: 0.74 mg/dL (ref 0.44–1.00)
GFR, Estimated: >60 mL/min (ref >60)
Glucose, Bld: 161 mg/dL — ABNORMAL HIGH (ref 70–99)
Potassium: 4.9 mmol/L (ref 3.5–5.1)
Sodium: 140 mmol/L (ref 135–145)

## 2022-02-06 LAB — ECHO INTRAOPERATIVE TEE
AV Mean grad: 2 mmHg
Height: 64 in
MV Vena cont: 0.3 cm
Weight: 2063.51 oz

## 2022-02-06 LAB — HEPARIN LEVEL (UNFRACTIONATED): Heparin Unfractionated: 0.16 IU/mL — ABNORMAL LOW (ref 0.30–0.70)

## 2022-02-06 LAB — PLATELET COUNT: Platelets: 116 10*3/uL — ABNORMAL LOW (ref 150–400)

## 2022-02-06 LAB — COMPREHENSIVE METABOLIC PANEL
ALT: 29 U/L (ref 0–44)
AST: 33 U/L (ref 15–41)
Albumin: 2.5 g/dL — ABNORMAL LOW (ref 3.5–5.0)
Alkaline Phosphatase: 60 U/L (ref 38–126)
Anion gap: 7 (ref 5–15)
BUN: 39 mg/dL — ABNORMAL HIGH (ref 8–23)
CO2: 32 mmol/L (ref 22–32)
Calcium: 8.2 mg/dL — ABNORMAL LOW (ref 8.9–10.3)
Chloride: 106 mmol/L (ref 98–111)
Creatinine, Ser: 0.84 mg/dL (ref 0.44–1.00)
GFR, Estimated: >60 mL/min (ref >60)
Glucose, Bld: 140 mg/dL — ABNORMAL HIGH (ref 70–99)
Potassium: 3.6 mmol/L (ref 3.5–5.1)
Sodium: 145 mmol/L (ref 135–145)
Total Bilirubin: 0.5 mg/dL (ref 0.3–1.2)
Total Protein: 5.8 g/dL — ABNORMAL LOW (ref 6.5–8.1)

## 2022-02-06 LAB — HEMOGLOBIN AND HEMATOCRIT, BLOOD
HCT: 24.8 % — ABNORMAL LOW (ref 36.0–46.0)
Hemoglobin: 8.3 g/dL — ABNORMAL LOW (ref 12.0–15.0)

## 2022-02-06 LAB — MAGNESIUM
Magnesium: 2.4 mg/dL (ref 1.7–2.4)
Magnesium: 3.6 mg/dL — ABNORMAL HIGH (ref 1.7–2.4)

## 2022-02-06 LAB — GLUCOSE, CAPILLARY
Glucose-Capillary: 126 mg/dL — ABNORMAL HIGH (ref 70–99)
Glucose-Capillary: 144 mg/dL — ABNORMAL HIGH (ref 70–99)
Glucose-Capillary: 155 mg/dL — ABNORMAL HIGH (ref 70–99)
Glucose-Capillary: 162 mg/dL — ABNORMAL HIGH (ref 70–99)
Glucose-Capillary: 133 mg/dL — ABNORMAL HIGH (ref 70–99)
Glucose-Capillary: 164 mg/dL — ABNORMAL HIGH (ref 70–99)
Glucose-Capillary: 153 mg/dL — ABNORMAL HIGH (ref 70–99)

## 2022-02-06 LAB — PREPARE RBC (CROSSMATCH)

## 2022-02-06 LAB — FIBRINOGEN: Fibrinogen: 463 mg/dL (ref 210–475)

## 2022-02-06 LAB — APTT: aPTT: 34 seconds (ref 24–36)

## 2022-02-06 LAB — PROTIME-INR
INR: 1.3 — ABNORMAL HIGH (ref 0.8–1.2)
Prothrombin Time: 16.1 seconds — ABNORMAL HIGH (ref 11.4–15.2)

## 2022-02-06 LAB — PHOSPHORUS: Phosphorus: 3.4 mg/dL (ref 2.5–4.6)

## 2022-02-06 SURGERY — CORONARY ARTERY BYPASS GRAFTING (CABG)
Anesthesia: General | Site: Chest

## 2022-02-06 MED ORDER — LACTATED RINGERS IV SOLN: 500.0000 mL | Freq: Once | INTRAVENOUS | Status: DC | PRN

## 2022-02-06 MED ORDER — NITROGLYCERIN IN D5W 200-5 MCG/ML-% IV SOLN: 0.0000 ug/min | INTRAVENOUS | Status: DC

## 2022-02-06 MED ORDER — SODIUM CHLORIDE 0.9% FLUSH
3.0000 mL | Freq: Two times a day (BID) | INTRAVENOUS | Status: DC
  Administered 2022-02-07 – 2022-02-13 (×11): 3 mL via INTRAVENOUS

## 2022-02-06 MED ORDER — PLASMA-LYTE A IV SOLN: INTRAVENOUS | Status: DC | PRN

## 2022-02-06 MED ORDER — ACETAMINOPHEN 160 MG/5ML PO SOLN: 650.0000 mg | Freq: Once | ORAL | Status: AC

## 2022-02-06 MED ORDER — DOCUSATE SODIUM 100 MG PO CAPS
200.0000 mg | ORAL_CAPSULE | Freq: Every day | ORAL | Status: DC
  Administered 2022-02-08 – 2022-02-17 (×7): 200 mg via ORAL
  Filled 2022-02-06 (×10): qty 2

## 2022-02-06 MED ORDER — ACETAMINOPHEN 650 MG RE SUPP
650.0000 mg | Freq: Once | RECTAL | Status: AC
  Administered 2022-02-06: 650 mg via RECTAL

## 2022-02-06 MED ORDER — THROMBIN (RECOMBINANT) 20000 UNITS EX SOLR
CUTANEOUS | Status: AC
  Filled 2022-02-06: qty 20000

## 2022-02-06 MED ORDER — SODIUM CHLORIDE 0.9 % IV SOLN: INTRAVENOUS | Status: DC

## 2022-02-06 MED ORDER — MIDAZOLAM HCL (PF) 5 MG/ML IJ SOLN
INTRAMUSCULAR | Status: DC | PRN
  Administered 2022-02-06 (×5): 2 mg via INTRAVENOUS

## 2022-02-06 MED ORDER — GABAPENTIN 250 MG/5ML PO SOLN
300.0000 mg | Freq: Two times a day (BID) | ORAL | Status: DC
  Administered 2022-02-06: 300 mg
  Filled 2022-02-06 (×2): qty 6

## 2022-02-06 MED ORDER — GABAPENTIN 600 MG PO TABS: 300.0000 mg | ORAL_TABLET | Freq: Two times a day (BID) | ORAL | Status: DC

## 2022-02-06 MED ORDER — DEXMEDETOMIDINE HCL IN NACL 400 MCG/100ML IV SOLN
0.0000 ug/kg/h | INTRAVENOUS | Status: DC
  Administered 2022-02-06 (×2): 0.7 ug/kg/h via INTRAVENOUS
  Filled 2022-02-06 (×2): qty 100

## 2022-02-06 MED ORDER — SODIUM CHLORIDE 0.9% FLUSH: 3.0000 mL | INTRAVENOUS | Status: DC | PRN

## 2022-02-06 MED ORDER — THROMBIN 20000 UNITS EX SOLR: OROMUCOSAL | Status: DC | PRN

## 2022-02-06 MED ORDER — PROTAMINE SULFATE 10 MG/ML IV SOLN
INTRAVENOUS | Status: DC | PRN
  Administered 2022-02-06: 200 mg via INTRAVENOUS

## 2022-02-06 MED ORDER — CEFAZOLIN SODIUM-DEXTROSE 2-4 GM/100ML-% IV SOLN
2.0000 g | Freq: Three times a day (TID) | INTRAVENOUS | Status: AC
  Administered 2022-02-06 – 2022-02-08 (×6): 2 g via INTRAVENOUS
  Filled 2022-02-06 (×6): qty 100

## 2022-02-06 MED ORDER — MIDAZOLAM HCL (PF) 10 MG/2ML IJ SOLN
INTRAMUSCULAR | Status: AC
Start: 2022-02-06 — End: ?
  Filled 2022-02-06: qty 2

## 2022-02-06 MED ORDER — ALBUMIN HUMAN 5 % IV SOLN
250.0000 mL | INTRAVENOUS | Status: AC | PRN
  Administered 2022-02-06 (×2): 12.5 g via INTRAVENOUS
  Filled 2022-02-06: qty 250

## 2022-02-06 MED ORDER — SODIUM CHLORIDE 0.9% IV SOLUTION: Freq: Once | INTRAVENOUS | Status: DC

## 2022-02-06 MED ORDER — ASPIRIN 81 MG PO CHEW
324.0000 mg | CHEWABLE_TABLET | Freq: Every day | ORAL | Status: DC
  Administered 2022-02-07: 324 mg
  Filled 2022-02-06: qty 4

## 2022-02-06 MED ORDER — MILRINONE LACTATE IN DEXTROSE 20-5 MG/100ML-% IV SOLN
0.2500 ug/kg/min | INTRAVENOUS | Status: DC
  Administered 2022-02-06 – 2022-02-07 (×2): 0.25 ug/kg/min via INTRAVENOUS
  Filled 2022-02-06 (×3): qty 100

## 2022-02-06 MED ORDER — FENTANYL CITRATE (PF) 250 MCG/5ML IJ SOLN
INTRAMUSCULAR | Status: DC | PRN
  Administered 2022-02-06 (×4): 50 ug via INTRAVENOUS
  Administered 2022-02-06: 100 ug via INTRAVENOUS
  Administered 2022-02-06 (×2): 50 ug via INTRAVENOUS

## 2022-02-06 MED ORDER — SODIUM CHLORIDE 0.45 % IV SOLN: INTRAVENOUS | Status: DC | PRN

## 2022-02-06 MED ORDER — FENTANYL CITRATE (PF) 250 MCG/5ML IJ SOLN
INTRAMUSCULAR | Status: AC
  Filled 2022-02-06: qty 5

## 2022-02-06 MED ORDER — DEXTROSE 50 % IV SOLN: 0.0000 mL | INTRAVENOUS | Status: DC | PRN

## 2022-02-06 MED ORDER — SODIUM CHLORIDE 0.9% FLUSH: 10.0000 mL | INTRAVENOUS | Status: DC | PRN

## 2022-02-06 MED ORDER — BISACODYL 5 MG PO TBEC
10.0000 mg | DELAYED_RELEASE_TABLET | Freq: Every day | ORAL | Status: DC
  Administered 2022-02-08 – 2022-02-17 (×6): 10 mg via ORAL
  Filled 2022-02-06 (×10): qty 2

## 2022-02-06 MED ORDER — INSULIN REGULAR(HUMAN) IN NACL 100-0.9 UT/100ML-% IV SOLN: INTRAVENOUS | Status: DC

## 2022-02-06 MED ORDER — CHLORHEXIDINE GLUCONATE CLOTH 2 % EX PADS
6.0000 | MEDICATED_PAD | Freq: Every day | CUTANEOUS | Status: DC
  Administered 2022-02-06 – 2022-02-14 (×9): 6 via TOPICAL

## 2022-02-06 MED ORDER — METOPROLOL TARTRATE 12.5 MG HALF TABLET: 12.5000 mg | ORAL_TABLET | Freq: Two times a day (BID) | ORAL | Status: DC

## 2022-02-06 MED ORDER — PHENYLEPHRINE HCL-NACL 20-0.9 MG/250ML-% IV SOLN: 0.0000 ug/min | INTRAVENOUS | Status: DC

## 2022-02-06 MED ORDER — OXYCODONE HCL 5 MG PO TABS
5.0000 mg | ORAL_TABLET | ORAL | Status: DC | PRN
  Filled 2022-02-06: qty 2

## 2022-02-06 MED ORDER — METOPROLOL TARTRATE 25 MG/10 ML ORAL SUSPENSION: 12.5000 mg | Freq: Two times a day (BID) | ORAL | Status: DC

## 2022-02-06 MED ORDER — LACTATED RINGERS IV SOLN: INTRAVENOUS | Status: DC

## 2022-02-06 MED ORDER — ASPIRIN EC 325 MG PO TBEC
325.0000 mg | DELAYED_RELEASE_TABLET | Freq: Every day | ORAL | Status: DC
  Administered 2022-02-08 – 2022-02-09 (×2): 325 mg via ORAL
  Filled 2022-02-06 (×2): qty 1

## 2022-02-06 MED ORDER — LACTATED RINGERS IV SOLN: INTRAVENOUS | Status: DC | PRN

## 2022-02-06 MED ORDER — DEXMEDETOMIDINE HCL IN NACL 400 MCG/100ML IV SOLN
0.4000 ug/kg/h | INTRAVENOUS | Status: DC
  Administered 2022-02-06: 0.9 ug/kg/h via INTRAVENOUS
  Administered 2022-02-07: 1.1 ug/kg/h via INTRAVENOUS
  Filled 2022-02-06: qty 100

## 2022-02-06 MED ORDER — ACETAMINOPHEN 500 MG PO TABS
1000.0000 mg | ORAL_TABLET | Freq: Four times a day (QID) | ORAL | Status: AC
  Administered 2022-02-07 – 2022-02-11 (×17): 1000 mg via ORAL
  Filled 2022-02-06 (×18): qty 2

## 2022-02-06 MED ORDER — POTASSIUM CHLORIDE 10 MEQ/50ML IV SOLN
10.0000 meq | INTRAVENOUS | Status: AC
  Administered 2022-02-06 (×3): 10 meq via INTRAVENOUS

## 2022-02-06 MED ORDER — TRAMADOL HCL 50 MG PO TABS: 50.0000 mg | ORAL_TABLET | ORAL | Status: DC | PRN

## 2022-02-06 MED ORDER — CHLORHEXIDINE GLUCONATE 0.12 % MT SOLN
15.0000 mL | OROMUCOSAL | Status: AC
  Administered 2022-02-06: 15 mL via OROMUCOSAL

## 2022-02-06 MED ORDER — VANCOMYCIN HCL IN DEXTROSE 1-5 GM/200ML-% IV SOLN
1000.0000 mg | Freq: Once | INTRAVENOUS | Status: AC
  Administered 2022-02-06: 1000 mg via INTRAVENOUS
  Filled 2022-02-06: qty 200

## 2022-02-06 MED ORDER — METOPROLOL TARTRATE 5 MG/5ML IV SOLN: 2.5000 mg | INTRAVENOUS | Status: DC | PRN

## 2022-02-06 MED ORDER — SODIUM CHLORIDE 0.9% FLUSH
10.0000 mL | Freq: Two times a day (BID) | INTRAVENOUS | Status: DC
  Administered 2022-02-06 – 2022-02-08 (×5): 10 mL
  Administered 2022-02-09: 20 mL
  Administered 2022-02-09 – 2022-02-12 (×7): 10 mL

## 2022-02-06 MED ORDER — HEPARIN SODIUM (PORCINE) 1000 UNIT/ML IJ SOLN
INTRAMUSCULAR | Status: DC | PRN
  Administered 2022-02-06: 20000 [IU] via INTRAVENOUS

## 2022-02-06 MED ORDER — SODIUM CHLORIDE 0.9 % IV SOLN: INTRAVENOUS | Status: DC | PRN

## 2022-02-06 MED ORDER — ACETAMINOPHEN 160 MG/5ML PO SOLN
1000.0000 mg | Freq: Four times a day (QID) | ORAL | Status: DC
  Administered 2022-02-07 (×2): 1000 mg
  Filled 2022-02-06 (×2): qty 40.6

## 2022-02-06 MED ORDER — PANTOPRAZOLE SODIUM 40 MG PO TBEC
40.0000 mg | DELAYED_RELEASE_TABLET | Freq: Every day | ORAL | Status: DC
  Administered 2022-02-08 – 2022-02-19 (×12): 40 mg via ORAL
  Filled 2022-02-06 (×13): qty 1

## 2022-02-06 MED ORDER — ROCURONIUM BROMIDE 10 MG/ML (PF) SYRINGE
PREFILLED_SYRINGE | INTRAVENOUS | Status: DC | PRN
  Administered 2022-02-06 (×7): 50 mg via INTRAVENOUS

## 2022-02-06 MED ORDER — CHLORHEXIDINE GLUCONATE 0.12% ORAL RINSE (MEDLINE KIT)
15.0000 mL | Freq: Two times a day (BID) | OROMUCOSAL | Status: DC
  Administered 2022-02-06 – 2022-02-07 (×2): 15 mL via OROMUCOSAL

## 2022-02-06 MED ORDER — ORAL CARE MOUTH RINSE
15.0000 mL | OROMUCOSAL | Status: DC
  Administered 2022-02-06 – 2022-02-07 (×5): 15 mL via OROMUCOSAL

## 2022-02-06 MED ORDER — LACTATED RINGERS IV SOLN
INTRAVENOUS | Status: DC | PRN
Start: 2022-02-06 — End: 2022-02-06

## 2022-02-06 MED ORDER — BISACODYL 10 MG RE SUPP
10.0000 mg | Freq: Every day | RECTAL | Status: DC
  Filled 2022-02-06: qty 1

## 2022-02-06 MED ORDER — ONDANSETRON HCL 4 MG/2ML IJ SOLN
4.0000 mg | Freq: Four times a day (QID) | INTRAMUSCULAR | Status: DC | PRN
  Administered 2022-02-07 – 2022-02-10 (×4): 4 mg via INTRAVENOUS
  Filled 2022-02-06 (×4): qty 2

## 2022-02-06 MED ORDER — MORPHINE SULFATE (PF) 2 MG/ML IV SOLN
1.0000 mg | INTRAVENOUS | Status: DC | PRN
  Administered 2022-02-06 – 2022-02-08 (×5): 2 mg via INTRAVENOUS
  Administered 2022-02-08 – 2022-02-09 (×2): 4 mg via INTRAVENOUS
  Filled 2022-02-06: qty 1
  Filled 2022-02-06: qty 2
  Filled 2022-02-06 (×3): qty 1
  Filled 2022-02-06: qty 2
  Filled 2022-02-06: qty 1

## 2022-02-06 MED ORDER — FAMOTIDINE IN NACL 20-0.9 MG/50ML-% IV SOLN
20.0000 mg | Freq: Two times a day (BID) | INTRAVENOUS | Status: AC
  Administered 2022-02-06 – 2022-02-07 (×2): 20 mg via INTRAVENOUS
  Filled 2022-02-06 (×2): qty 50

## 2022-02-06 MED ORDER — PROPOFOL 10 MG/ML IV BOLUS
INTRAVENOUS | Status: AC
  Filled 2022-02-06: qty 20

## 2022-02-06 MED ORDER — 0.9 % SODIUM CHLORIDE (POUR BTL) OPTIME
TOPICAL | Status: DC | PRN
  Administered 2022-02-06: 5000 mL

## 2022-02-06 MED ORDER — SODIUM CHLORIDE 0.9 % IV SOLN: 250.0000 mL | INTRAVENOUS | Status: DC

## 2022-02-06 MED ORDER — MAGNESIUM SULFATE 4 GM/100ML IV SOLN
4.0000 g | Freq: Once | INTRAVENOUS | Status: AC
  Administered 2022-02-06: 4 g via INTRAVENOUS
  Filled 2022-02-06: qty 100

## 2022-02-06 MED ORDER — EPINEPHRINE HCL 5 MG/250ML IV SOLN IN NS: 0.0000 ug/min | INTRAVENOUS | Status: DC

## 2022-02-06 MED ORDER — HEMOSTATIC AGENTS (NO CHARGE) OPTIME
TOPICAL | Status: DC | PRN
  Administered 2022-02-06: 1 via TOPICAL

## 2022-02-06 MED ORDER — NOREPINEPHRINE 4 MG/250ML-% IV SOLN
0.0000 ug/min | INTRAVENOUS | Status: DC
  Administered 2022-02-06: 1 ug/min via INTRAVENOUS
  Administered 2022-02-07 (×2): 6 ug/min via INTRAVENOUS
  Administered 2022-02-07: 10 ug/min via INTRAVENOUS
  Administered 2022-02-08: 8 ug/min via INTRAVENOUS
  Administered 2022-02-08: 10 ug/min via INTRAVENOUS
  Administered 2022-02-09: 8 ug/min via INTRAVENOUS
  Administered 2022-02-09: 4 ug/min via INTRAVENOUS
  Administered 2022-02-10: 3 ug/min via INTRAVENOUS
  Administered 2022-02-10: 8 ug/min via INTRAVENOUS
  Administered 2022-02-11: 12 ug/min via INTRAVENOUS
  Administered 2022-02-11 (×2): 8 ug/min via INTRAVENOUS
  Administered 2022-02-12: 7 ug/min via INTRAVENOUS
  Filled 2022-02-06 (×9): qty 250
  Filled 2022-02-06: qty 500
  Filled 2022-02-06 (×3): qty 250

## 2022-02-06 MED ORDER — MIDAZOLAM HCL 2 MG/2ML IJ SOLN: 2.0000 mg | INTRAMUSCULAR | Status: DC | PRN

## 2022-02-06 SURGICAL SUPPLY — 139 items
ADAPTER CARDIO PERF ANTE/RETRO (ADAPTER) ×3 IMPLANT
ADH SKN CLS APL DERMABOND .7 (GAUZE/BANDAGES/DRESSINGS) ×4
ADPR PRFSN 84XANTGRD RTRGD (ADAPTER) ×2
BAG DECANTER FOR FLEXI CONT (MISCELLANEOUS) ×3 IMPLANT
BLADE CLIPPER SURG (BLADE) ×3 IMPLANT
BLADE STERNUM SYSTEM 6 (BLADE) ×3 IMPLANT
BLADE SURG 15 STRL LF DISP TIS (BLADE) ×2 IMPLANT
BLADE SURG 15 STRL SS (BLADE) ×3
BNDG ELASTIC 4X5.8 VLCR STR LF (GAUZE/BANDAGES/DRESSINGS) ×4 IMPLANT
BNDG ELASTIC 6X5.8 VLCR STR LF (GAUZE/BANDAGES/DRESSINGS) ×4 IMPLANT
BNDG GAUZE ELAST 4 BULKY (GAUZE/BANDAGES/DRESSINGS) ×4 IMPLANT
CANISTER SUCT 3000ML PPV (MISCELLANEOUS) ×3 IMPLANT
CANN PRFSN 3/8XCNCT ST RT ANG (MISCELLANEOUS) ×2
CANNULA GUNDRY RCSP 15FR (MISCELLANEOUS) ×3 IMPLANT
CANNULA PRFSN 3/8XCNCT RT ANG (MISCELLANEOUS) IMPLANT
CANNULA SUMP PERICARDIAL (CANNULA) ×3 IMPLANT
CANNULA VEN MTL TIP RT (MISCELLANEOUS) ×3
CANNULA VRC MALB SNGL STG 32FR (MISCELLANEOUS) IMPLANT
CATH ROBINSON RED A/P 18FR (CATHETERS) ×10 IMPLANT
CATH THORACIC 28FR (CATHETERS) ×4 IMPLANT
CATH THORACIC 36FR (CATHETERS) ×3 IMPLANT
CATH THORACIC 36FR RT ANG (CATHETERS) ×3 IMPLANT
CLIP TI WIDE RED SMALL 24 (CLIP) ×1 IMPLANT
CLIP VESOCCLUDE MED 24/CT (CLIP) IMPLANT
CLIP VESOCCLUDE SM WIDE 24/CT (CLIP) IMPLANT
CNTNR URN SCR LID CUP LEK RST (MISCELLANEOUS) IMPLANT
CONN 1/2X1/2X1/2  BEN (MISCELLANEOUS) ×1
CONN 1/2X1/2X1/2 BEN (MISCELLANEOUS) ×2 IMPLANT
CONN 3/8X1/2 ST GISH (MISCELLANEOUS) ×6 IMPLANT
CONN ST 3/8 X 1/2 (MISCELLANEOUS) ×2 IMPLANT
CONN Y 3/8X3/8X3/8  BEN (MISCELLANEOUS) ×1
CONN Y 3/8X3/8X3/8 BEN (MISCELLANEOUS) IMPLANT
CONT SPEC 4OZ STRL OR WHT (MISCELLANEOUS) ×3
CONTAINER PROTECT SURGISLUSH (MISCELLANEOUS) ×6 IMPLANT
COUNTER NEEDLE 20 DBL MAG RED (NEEDLE) ×1 IMPLANT
COVER SURGICAL LIGHT HANDLE (MISCELLANEOUS) ×6 IMPLANT
DERMABOND ADVANCED (GAUZE/BANDAGES/DRESSINGS) ×2
DERMABOND ADVANCED .7 DNX12 (GAUZE/BANDAGES/DRESSINGS) IMPLANT
DEVICE SUT CK QUICK LOAD MINI (Prosthesis & Implant Heart) ×1 IMPLANT
DRAPE CARDIOVASCULAR INCISE (DRAPES) ×3
DRAPE SRG 135X102X78XABS (DRAPES) ×2 IMPLANT
DRAPE WARM FLUID 44X44 (DRAPES) ×3 IMPLANT
DRSG COVADERM 4X14 (GAUZE/BANDAGES/DRESSINGS) ×3 IMPLANT
ELECT CAUTERY BLADE 6.4 (BLADE) ×3 IMPLANT
ELECT REM PT RETURN 9FT ADLT (ELECTROSURGICAL) ×6
ELECTRODE REM PT RTRN 9FT ADLT (ELECTROSURGICAL) ×4 IMPLANT
FELT TEFLON 1X6 (MISCELLANEOUS) ×5 IMPLANT
GAUZE 4X4 16PLY ~~LOC~~+RFID DBL (SPONGE) ×3 IMPLANT
GAUZE SPONGE 4X4 12PLY STRL (GAUZE/BANDAGES/DRESSINGS) ×7 IMPLANT
GAUZE SPONGE 4X4 12PLY STRL LF (GAUZE/BANDAGES/DRESSINGS) ×3 IMPLANT
GLOVE BIOGEL PI IND STRL 6 (GLOVE) IMPLANT
GLOVE BIOGEL PI IND STRL 6.5 (GLOVE) IMPLANT
GLOVE BIOGEL PI INDICATOR 6 (GLOVE) ×2
GLOVE BIOGEL PI INDICATOR 6.5 (GLOVE) ×3
GLOVE SURG ENC MOIS LTX SZ6 (GLOVE) IMPLANT
GLOVE SURG ENC MOIS LTX SZ6.5 (GLOVE) IMPLANT
GLOVE SURG ENC MOIS LTX SZ7 (GLOVE) IMPLANT
GLOVE SURG ENC MOIS LTX SZ7.5 (GLOVE) IMPLANT
GLOVE SURG MICRO LTX SZ7 (GLOVE) ×6 IMPLANT
GLOVE SURG ORTHO LTX SZ7.5 (GLOVE) IMPLANT
GLOVE SURG UNDER POLY LF SZ6 (GLOVE) IMPLANT
GLOVE SURG UNDER POLY LF SZ6.5 (GLOVE) IMPLANT
GLOVE SURG UNDER POLY LF SZ7 (GLOVE) IMPLANT
GOWN STRL REUS W/ TWL LRG LVL3 (GOWN DISPOSABLE) ×8 IMPLANT
GOWN STRL REUS W/ TWL XL LVL3 (GOWN DISPOSABLE) ×2 IMPLANT
GOWN STRL REUS W/TWL LRG LVL3 (GOWN DISPOSABLE) ×12
GOWN STRL REUS W/TWL XL LVL3 (GOWN DISPOSABLE) ×6
HEMOSTAT POWDER SURGIFOAM 1G (HEMOSTASIS) ×9 IMPLANT
HEMOSTAT SURGICEL 2X14 (HEMOSTASIS) ×3 IMPLANT
INSERT FOGARTY 61MM (MISCELLANEOUS) IMPLANT
INSERT FOGARTY XLG (MISCELLANEOUS) IMPLANT
KIT BASIN OR (CUSTOM PROCEDURE TRAY) ×3 IMPLANT
KIT CATH CPB BARTLE (MISCELLANEOUS) ×3 IMPLANT
KIT SUCTION CATH 14FR (SUCTIONS) ×3 IMPLANT
KIT SUT CK MINI COMBO 4X17 (Prosthesis & Implant Heart) ×1 IMPLANT
KIT TURNOVER KIT B (KITS) ×3 IMPLANT
KIT VASOVIEW HEMOPRO 2 VH 4000 (KITS) ×3 IMPLANT
LOOP VESSEL SUPERMAXI WHITE (MISCELLANEOUS) ×3 IMPLANT
MARKER SKIN DUAL TIP RULER LAB (MISCELLANEOUS) ×1 IMPLANT
NS IRRIG 1000ML POUR BTL (IV SOLUTION) ×15 IMPLANT
PACK E OPEN HEART (SUTURE) ×3 IMPLANT
PACK OPEN HEART (CUSTOM PROCEDURE TRAY) ×3 IMPLANT
PAD ARMBOARD 7.5X6 YLW CONV (MISCELLANEOUS) ×6 IMPLANT
PAD ELECT DEFIB RADIOL ZOLL (MISCELLANEOUS) ×3 IMPLANT
PENCIL BUTTON HOLSTER BLD 10FT (ELECTRODE) ×3 IMPLANT
POSITIONER HEAD DONUT 9IN (MISCELLANEOUS) ×3 IMPLANT
PUNCH AORTIC ROTATE 4.0MM (MISCELLANEOUS) IMPLANT
PUNCH AORTIC ROTATE 4.5MM 8IN (MISCELLANEOUS) ×3 IMPLANT
PUNCH AORTIC ROTATE 5MM 8IN (MISCELLANEOUS) IMPLANT
SET MPS 3-ND DEL (MISCELLANEOUS) ×1 IMPLANT
SPONGE INTESTINAL PEANUT (DISPOSABLE) IMPLANT
SPONGE T-LAP 18X18 ~~LOC~~+RFID (SPONGE) ×13 IMPLANT
SPONGE T-LAP 4X18 ~~LOC~~+RFID (SPONGE) ×6 IMPLANT
SUPPORT HEART JANKE-BARRON (MISCELLANEOUS) ×3 IMPLANT
SUT BONE WAX W31G (SUTURE) ×3 IMPLANT
SUT ETHIBOND 2 0 SH (SUTURE) ×3 IMPLANT
SUT ETHIBOND 2 0 SH 36X2 (SUTURE) ×2 IMPLANT
SUT ETHIBOND 2 0 V4 (SUTURE) IMPLANT
SUT ETHIBOND 2 0V4 GREEN (SUTURE) IMPLANT
SUT ETHIBOND V-5 VALVE (SUTURE) ×2 IMPLANT
SUT MNCRL AB 4-0 PS2 18 (SUTURE) IMPLANT
SUT PROLENE 3 0 SH 48 (SUTURE) ×12 IMPLANT
SUT PROLENE 3 0 SH DA (SUTURE) ×1 IMPLANT
SUT PROLENE 3 0 SH1 36 (SUTURE) ×3 IMPLANT
SUT PROLENE 4 0 RB 1 (SUTURE) ×6
SUT PROLENE 4 0 SH DA (SUTURE) IMPLANT
SUT PROLENE 4-0 RB1 .5 CRCL 36 (SUTURE) ×4 IMPLANT
SUT PROLENE 5 0 C 1 36 (SUTURE) IMPLANT
SUT PROLENE 6 0 C 1 30 (SUTURE) ×5 IMPLANT
SUT PROLENE 7 0 BV 1 (SUTURE) ×1 IMPLANT
SUT PROLENE 7 0 BV1 MDA (SUTURE) ×4 IMPLANT
SUT PROLENE 8 0 BV175 6 (SUTURE) ×2 IMPLANT
SUT SILK  1 MH (SUTURE) ×1
SUT SILK 1 MH (SUTURE) IMPLANT
SUT SILK 2 0 SH (SUTURE) IMPLANT
SUT SILK 2 0 SH CR/8 (SUTURE) ×1 IMPLANT
SUT STEEL 6MS V (SUTURE) ×2 IMPLANT
SUT STEEL STERNAL CCS#1 18IN (SUTURE) IMPLANT
SUT STEEL SZ 6 DBL 3X14 BALL (SUTURE) IMPLANT
SUT VIC AB 1 CTX 36 (SUTURE) ×9
SUT VIC AB 1 CTX36XBRD ANBCTR (SUTURE) ×4 IMPLANT
SUT VIC AB 2-0 CT1 27 (SUTURE) ×3
SUT VIC AB 2-0 CT1 TAPERPNT 27 (SUTURE) IMPLANT
SUT VIC AB 2-0 CTX 27 (SUTURE) IMPLANT
SUT VIC AB 3-0 SH 27 (SUTURE)
SUT VIC AB 3-0 SH 27X BRD (SUTURE) IMPLANT
SUT VIC AB 3-0 X1 27 (SUTURE) ×1 IMPLANT
SUT VICRYL 4-0 PS2 18IN ABS (SUTURE) IMPLANT
SYSTEM SAHARA CHEST DRAIN ATS (WOUND CARE) ×3 IMPLANT
TAPE CLOTH SURG 4X10 WHT LF (GAUZE/BANDAGES/DRESSINGS) ×1 IMPLANT
TAPE PAPER 2X10 WHT MICROPORE (GAUZE/BANDAGES/DRESSINGS) ×1 IMPLANT
TOWEL GREEN STERILE (TOWEL DISPOSABLE) ×3 IMPLANT
TOWEL GREEN STERILE FF (TOWEL DISPOSABLE) ×3 IMPLANT
TRAY FOLEY SLVR 16FR TEMP STAT (SET/KITS/TRAYS/PACK) ×2 IMPLANT
TUBING LAP HI FLOW INSUFFLATIO (TUBING) ×3 IMPLANT
UNDERPAD 30X36 HEAVY ABSORB (UNDERPADS AND DIAPERS) ×3 IMPLANT
VALVE MITRAL MITRIS RESILIA 25 (Valve) ×1 IMPLANT
VRC MALLEABLE SINGLE STG 32FR (MISCELLANEOUS) ×3
WATER STERILE IRR 1000ML POUR (IV SOLUTION) ×6 IMPLANT

## 2022-02-06 NOTE — Interval H&P Note (Signed)
History and Physical Interval Note: ? ?02/06/2022 ?8:05 AM ? ?Tonya Hamilton  has presented today for surgery, with the diagnosis of CAD ?SEVERE MR.  The various methods of treatment have been discussed with the patient and family. After consideration of risks, benefits and other options for treatment, the patient has consented to  Procedure(s): ?CORONARY ARTERY BYPASS GRAFTING (CABG) (N/A) ?MITRAL VALVE (MV) REPLACEMENT (N/A) ?TRANSESOPHAGEAL ECHOCARDIOGRAM (TEE) (N/A) as a surgical intervention.  The patient's history has been reviewed, patient examined, no change in status, stable for surgery.  I have reviewed the patient's chart and labs.  Questions were answered to the patient's satisfaction.   ? ? ?[name] ? ? ?

## 2022-02-06 NOTE — Progress Notes (Signed)
RT NOTE: patient meets criteria for SBT this AM however patient is scheduled for CABG today therefore will leave on current ventilator settings at full support.  Patient is tolerating well at this time.  Will continue to monitor.  ?

## 2022-02-06 NOTE — Hospital Course (Addendum)
Referring Physician: Glenetta Hew, MD ?PCP:   Carolann Littler, MD ? ?Requested appointments for TCTS, Cardiology, and f/u Echo on 4/14. Plan for suture removal in CIR ? ? ? ? ? ? ?History of Present Illness: ?  ?The patient is a 76 year old woman with a history of hypertension, hyperlipidemia, and hypothyroidism who presented to the [facility] ER last night with acute onset of shortness of breath, cough, and feeling poorly.  She was found to be hypoxic on room air with a sat of 88%.  Her BNP was 366 with an elevated at [facility] troponin of 708 increasing to 1454.  She had a leukocytosis of 23.6 K.  Flu and COVID test were negative.  Chest x-ray showed diffuse reticulonodular interstitial disease throughout the lungs which was felt to possibly be an atypical infection or chronic lung disease.  Electrocardiogram showed sinus tachycardia at 108 bpm with global ST depression.  She was put on BiPAP for hypoxia and transferred to [facility] at [facility] for further evaluation.  She was started on empiric antibiotics for possible community-acquired pneumonia with sepsis.  Her troponin continued increasing to 6513.  Electrocardiogram done today showed ejection fraction of 30 to 35% with akinesis of the inferior and posterior walls.  There is hypokinesis of the anterolateral wall and entire apex.  There is grade 3 diastolic dysfunction with elevated left atrial pressure.  There is moderately elevated PASP and RVSP estimated at 50 mmHg.  There is severe mitral regurgitation.  There is a large left pleural effusion.  There is no evidence of aortic stenosis or insufficiency.  She was treated with intravenous diuresis with some improvement.  She reportedly has had some symptoms for 2 to 3 weeks that began with some upper back aching and then some cough followed by cute onset of shortness last night when laying down. ?  ?She was taken to the Cath Lab late  afternoon which showed left main and severe three-vessel coronary disease.  PA  pressure was elevated at 43/22 with a mean of 32.  PCWP was elevated with a mean of 26 and a V wave of 33 mmHg.  LVEDP was elevated at 28.  Cardiac index was 1.93 suggesting some cardiogenic shock.  She had an intra-aortic balloon pump placed and was intubated due to acute hypoxic respiratory failure.  She was started on norepinephrine for blood pressure support.  Dr. Cyndia Bent was called to the catheterization lab for consultation for consideration of surgery.  It was his opinion that she should undergo further medical stabilization to further maximize her diuresis.  Additionally the advanced heart failure team assisted with optimizing cardiac function preoperatively.   ? ?Hospital Course: ? ?Following admission and testing as described above she was medically optimized with the assistance of the advanced heart failure and critical care medicine teams.  She was felt to be stable enough to proceed and on 02/06/2022 she was taken the operating room at which time she underwent coronary artery bypass grafting x4 as well as mitral valve replacement.  She tolerated this procedure and was returned to the surgical intensive care unit in critical condition.  The patient was weaned and extubated on POD #1.  Her IABP was also weaned and removed without difficulty.  She required additional support with Milrinone and Levophed.  These were weaned as hemodynamics allowed.  She was followed closely by the advanced heart failure team. She was started on Lasix for hypervolemia.  Her chest tubes were removed without difficulty.  She was initiated  on coumadin for her Mitral Valve, this would need to be continued for 3 months.  By the third postoperative day, she was weaned off of the milrinone.  She remained on norepinephrine and Lasix continuous infusion.  She developed atrial flutter during the night on postop day 3.  Rapid atrial pacing was attempted but unable to achieve conversion.  He was started on IV amiodarone loading.  The  norepinephrine was weaned slowly.  Chest x-ray showed bilateral pleural effusions.  These were followed with periodic chest x-rays. ?Ms. Rivers was evaluated by the physical therapy team on postop day 4 and acute inpatient rehab was recommended as her eventual disposition. Anticoagulation was initiated with Coumadin for the prosthetic mitral valve. The INR was monitored daily. INR goal is 2-3 (MVR). The epicardial pacer wires were remove on the 5th post-op day. She developed a low-grade fever and leukocytosis. There were no signs of infection clinically. UA showed trace leukocytes. She was put on Vancomycin and Zosyn for possible pneumonia. She was also started on Colchicine for pericarditis. The invasive lines were removed or changed and pulmonary hyiene was encouraged. . The WBC trended down.  ?By the 7th post-op day, the oxygenhad been weaned to room air and she tolerated this well. CoOx was 64 off all inotropes and vasoactive drips. We requested interventional radiology to ultrasound the right pleural effusion and drain if indicated. There was not enough fluid to drain. Advanced heart failure followed post op and monitored co ox. Patient was started on Midodrine for labile BP. She was diuresed with Demadex. She was deconditioned and PT assisted with mobility. Per advanced heart failure, volume status improved so Demadex was stopped and she was continued on Spironolactone. She was surgically stable for transfer from the ICU to [facility] for further convalescence on 04/18. She continued to make steady progress. Zosyn was finished on 04/20. As discussed with Dr. Cyndia Bent, she will be given 5 days of Augmentin (likely pulmonary source as BC and UC negaitve and no sign of wound infection). INR is up to 2.4 this am so she will be discharged on 3 mg of Coumadin. She will need a PT/INR on Monday 04/24 which has been arranged for 10:30 am. She did develop diarrhea so we stopped Colchicine. As discussed with Dr. Cyndia Bent, Midodrine  was decreased to 2.5 mg bid at discharge. Advanced heart failure started Farxiga 10 mg daily today as well. AHF will make a 6 week appointment for echo as well. She is surgically stable for discharge today. ? ?

## 2022-02-06 NOTE — Progress Notes (Addendum)
? ? Advanced Heart Failure Rounding Note ? ?PCP-Cardiologist: None  ? ?Subjective:   ?76 y/o with severe CAD/NSTEMI and iCM EF 30-35%. Developed refractory angina and shock with respiratory failure. Intubated and IABP placed.  ?4/7 taken to OR for CABG + MVR  ? ?Just returned from OR. Intubated and sedated.  ? ?IABP 1:1, on NEO 30 + Milrinone 0.25 + insulin gtt.  ? ?MAP 92  ? ?Swan #s ?CVP 13 ?PA 32/21 (25) ?CO 2.46  ?CI 1.50  ? ? ?Objective:   ?Weight Range: ?58.5 kg ?Body mass index is 22.14 kg/m?.  ? ?Vital Signs:   ?Temp:  [97.7 ?F (36.5 ?C)-99.5 ?F (37.5 ?C)] 98.8 ?F (37.1 ?C) (04/07 0700) ?Pulse Rate:  [62-197] 62 (04/07 0758) ?Resp:  [15-18] 16 (04/07 0758) ?BP: (100-132)/(35-91) 130/35 (04/07 0758) ?SpO2:  [94 %-100 %] 100 % (04/07 0758) ?Arterial Line BP: (105-145)/(30-45) 121/37 (04/07 0700) ?FiO2 (%):  [40 %] 40 % (04/07 0758) ?Weight:  [58.5 kg] 58.5 kg (04/07 0500) ?Last BM Date : 02/03/22 ? ?Weight change: ?Filed Weights  ? 02/04/22 0500 02/05/22 0500 02/06/22 0500  ?Weight: 59.4 kg 59.7 kg 58.5 kg  ? ? ?Intake/Output:  ? ?Intake/Output Summary (Last 24 hours) at 02/06/2022 1559 ?Last data filed at 02/06/2022 1535 ?Gross per 24 hour  ?Intake 4270.82 ml  ?Output 2040 ml  ?Net 2230.82 ml  ?  ? ? ?Physical Exam  ? ?General:  intubated and sedated  ?HEENT: normal + ETT ?Neck: supple. JVD 10 cm. Carotids 2+ bilat; no bruits. No lymphadenopathy or thyromegaly appreciated. ?Cor: PMI nondisplaced. RRR, + sternal dressing, + CTs ?Lungs: intubated and clear ?Abdomen: soft, nontender, nondistended. No hepatosplenomegaly. No bruits or masses. Good bowel sounds. ?Extremities: no cyanosis, clubbing, rash, 1+ LE edema + left femoral IABP  ?Neuro: intubated and sedated  ? ? ?Telemetry  ? ?Paced 90 bpm personally checked.  ? ?EKG  ? N/A  ?Labs  ?  ?CBC ?Recent Labs  ?  02/05/22 ?0355 02/06/22 ?0355 02/06/22 ?1424  ?WBC 18.4* 15.3*  --   ?HGB 10.8* 9.6* 8.3*  ?HCT 33.9* 30.0* 24.8*  ?MCV 88.7 87.7  --   ?PLT 223 178 116*   ? ?Basic Metabolic Panel ?Recent Labs  ?  02/05/22 ?1533 02/06/22 ?0355  ?NA 144 145  ?K 4.0 3.6  ?CL 106 106  ?CO2 31 32  ?GLUCOSE 179* 140*  ?BUN 35* 39*  ?CREATININE 0.96 0.84  ?CALCIUM 8.0* 8.2*  ?MG 2.2 2.4  ?PHOS 3.5 3.4  ? ?Liver Function Tests ?Recent Labs  ?  02/05/22 ?1533 02/06/22 ?0355  ?AST 43* 33  ?ALT 30 29  ?ALKPHOS 58 60  ?BILITOT 0.8 0.5  ?PROT 5.7* 5.8*  ?ALBUMIN 2.6* 2.5*  ? ?No results for input(s): LIPASE, AMYLASE in the last 72 hours. ?Cardiac Enzymes ?No results for input(s): CKTOTAL, CKMB, CKMBINDEX, TROPONINI in the last 72 hours. ? ?BNP: ?BNP (last 3 results) ?Recent Labs  ?  02/02/22 ?2321  ?BNP 366.4*  ? ? ?ProBNP (last 3 results) ?No results for input(s): PROBNP in the last 8760 hours. ? ? ?D-Dimer ?No results for input(s): DDIMER in the last 72 hours. ?Hemoglobin A1C ?Recent Labs  ?  02/04/22 ?0300  ?HGBA1C 5.7*  ? ?Fasting Lipid Panel ?Recent Labs  ?  02/04/22 ?0300  ?CHOL 151  ?HDL 39*  ?Glasscock 95  ?TRIG 83  ?CHOLHDL 3.9  ? ?Thyroid Function Tests ?No results for input(s): TSH, T4TOTAL, T3FREE, THYROIDAB in the last 72 hours. ? ?  Invalid input(s): FREET3 ? ? ?Other results: ? ? ?Imaging  ? ? ?DG CHEST PORT 1 VIEW ? ?Result Date: 02/06/2022 ?CLINICAL DATA:  Encounter for intra-balloon bone management. EXAM: PORTABLE CHEST 1 VIEW COMPARISON:  Portable chest yesterday at 5:45 a.m. FINDINGS: 6:15 a.m., 02/06/2022. Swan-Ganz catheter approaching from inferiorly again has its tip in the distal right pulmonary artery. IABP radiopaque marker again seen just below the aortic knob, stable. There is aortic atherosclerosis with stable mediastinum. ETT tip remains 4.6 cm from the carina with NGT entering the stomach but not fully included. Cardiac size is stable. Mild perihilar vascular congestion and mild lower zonal interstitial edema persist with small underlying pleural effusions and hazy overlying opacities in the lower lung fields, consistent with atelectasis, pneumonitis or edema. The mid  and upper lung fields remain clear. The overall aeration seems unchanged IMPRESSION: 1. All support devices are unaltered in positioning. 2. Small pleural effusions with stable mild perihilar vascular congestion, lower zonal interstitial edema and stable overlying hazy opacity of the lower lung fields. Electronically Signed   By: Telford Nab M.D.   On: 02/06/2022 06:54  ? ?VAS US DOPPLER PRE CABG ? ?Result Date: 02/05/2022 ?PREOPERATIVE VASCULAR EVALUATION Patient Name:  Tonya Hamilton  Date of Exam:   02/05/2022 Medical Rec #: [id number]         Accession #:    [id number] Date of Birth: [birth year]         Patient Gender: F Patient Age:   9 years Exam Location:  [facility] Procedure:      VAS US DOPPLER PRE CABG Referring Phys: Gilford Raid --------------------------------------------------------------------------------  Indications:      Pre-CABG. Risk Factors:     Hypertension, hyperlipidemia. Limitations:      IABP Comparison Study: No prior studies. Performing Technologist: Carlos Levering RVT  Examination Guidelines: A complete evaluation includes B-mode imaging, spectral Doppler, color Doppler, and power Doppler as needed of all accessible portions of each vessel. Bilateral testing is considered an integral part of a complete examination. Limited examinations for reoccurring indications may be performed as noted.  Right Carotid Findings: +----------+--------+--------+--------+-----------------------+--------+           PSV cm/sEDV cm/sStenosisDescribe               Comments +----------+--------+--------+--------+-----------------------+--------+ CCA Prox                          smooth and heterogenous         +----------+--------+--------+--------+-----------------------+--------+ CCA Distal                        smooth and heterogenous         +----------+--------+--------+--------+-----------------------+--------+ ICA Prox                          calcific                         +----------+--------+--------+--------+-----------------------+--------+ ICA Distal                                               tortuous +----------+--------+--------+--------+-----------------------+--------+ +---------+--------+--------+---------+ VertebralPSV cm/sEDV cm/sAntegrade +---------+--------+--------+---------+ Left Carotid Findings: +----------+--------+--------+--------+-----------------------+--------+           PSV cm/sEDV cm/sStenosisDescribe  Comments +----------+--------+--------+--------+-----------------------+--------+ CCA Prox                          smooth and heterogenous         +----------+--------+--------+--------+-----------------------+--------+ CCA Distal                        smooth and heterogenous         +----------+--------+--------+--------+-----------------------+--------+ ICA Prox                          calcific                        +----------+--------+--------+--------+-----------------------+--------+ ICA Distal                                               tortuous +----------+--------+--------+--------+-----------------------+--------+ +---------+--------+--------+---------+ VertebralPSV cm/sEDV cm/sAntegrade +---------+--------+--------+---------+  ABI Findings: +--------+------------------+-----+--------+--------+ Right   Rt Pressure (mmHg)IndexWaveformComment  +--------+------------------+-----+--------+--------+ [id number]                                     +--------+------------------+-----+--------+--------+ PTA     113               0.77                  +--------+------------------+-----+--------+--------+ DP      113               0.77                  +--------+------------------+-----+--------+--------+ +--------+------------------+-----+--------+-------+ Left    Lt Pressure (mmHg)IndexWaveformComment  +--------+------------------+-----+--------+-------+ [id number]                                    +--------+------------------+-----+--------+-------+ PTA     112               0.76                 +--------+------------------+-----+--------+-------+ DP      107               0.73                 +--------+------------------+-----+--------+-------+ +------

## 2022-02-06 NOTE — Progress Notes (Signed)
? ?NAME:  NURAH PETRIDES, MRN:  [id number], DOB:  [birth year], LOS: 3 ?ADMISSION DATE:  02/02/2022, CONSULTATION DATE:  02/03/22 ?REFERRING MD:  Christy Gentles CHIEF COMPLAINT:  Dyspnea  ? ?History of Present Illness:  ?Tonya Hamilton is a 76 y.o. female who has a PMH as outlined below.  She presented to [facility] ED 4/3 with dyspnea.  She states that symptoms began suddenly and progressed.  She also endorsed cough intimately productive of sputum.  No fevers/chills/sweats, chest pain, N/V/D, abdominal pain, myalgias, exposures known sick contacts, recent travel. ? ?In ED, she was found to be hypoxic.  CXR showed bilateral opacities and bilateral mild edema.  Troponin was elevated at 708 with repeat up to 1454.  BNP mildly elevated at 366.  EKG with ST depressions and global ischemia.  Leukocytosis to 23.6.  She was initially placed on Ventimask but was later upgraded to BiPAP.  She continued to require BiPAP which she did feel subjective improvement in symptoms.  Patient stable subsequently called for transfer to ICU at [facility] for further evaluation management. ? ?Pertinent  Medical History:  ?has History of colonic diverticulitis; Osteoarthritis; Hypertension; Hyperlipidemia; Hypothyroid; GERD (gastroesophageal reflux disease); Osteopenia; Anxiety; Osteoarthritis of right hip; Angioedema; Chronic intractable headache; Occipital neuralgia of left side; Neck pain; Hyperreflexia; Arthritis, multiple joint involvement; Spasmodic dysphonia; Acute respiratory failure ([place]); Respiratory failure ([place]); Community acquired pneumonia; Non-STEMI (non-ST elevated myocardial infarction) ([place]); Cardiogenic shock ([place]); Severe mitral regurgitation; Ischemic cardiomyopathy; and Multiple vessel coronary artery disease on their problem list. ? ?Significant Hospital Events: ?Including procedures, antibiotic start and stop dates in addition to other pertinent events   ?4/4 admit, started on Cefepime and Azith and Bipap.  Trop rising, on  heparin gtt,  Echo with reduced EF and regional wall motion abnormalities with severe MR, cardiology consulted and taken to cath lab which revealed severe LM and 3V disease.  IABP placed and intubated, in cardiogenic shock ?4/6 Lying in bed on IABP with progressive stability seen, plan for CABG with MVR tomorrow  ?4/7 OR to CABG ans MVR, no issues overnight  ? ?Studies: ?Echo 4/5>  ?The inferior wall and posterior wall are akinetic. The antero-lateral wall and entire apex are hypokinetic. The anterior wall, anterior septum, mid inferoseptal segment, and basal inferoseptal segment are normal.  ?Right Ventricle: The right ventricular size is normal. No increase in right ventricular wall thickness. Right ventricular systolic function is normal. There is moderately elevated pulmonary artery systolic pressure. The tricuspid regurgitant velocity is 2.96 m/s, and with an assumed right atrial pressure of 15 mmHg, the estimated right ventricular systolic pressure is 76.7 mmHg.  ? ?Abx ?Cefdinir 4/4- ? ?Interim History / Subjective:  ?Sedated on vent  ? ?Objective:  ?Blood pressure (!) 130/35, pulse 62, temperature 98.8 ?F (37.1 ?C), resp. rate 16, height '5\' 4"'$  (1.626 m), weight 58.5 kg, SpO2 100 %. ?PAP: (14-39)/(9-33) 17/12 ?CVP:  [1 mmHg-21 mmHg] 5 mmHg ?CO:  [2.8 L/min-3.8 L/min] 2.8 L/min ?CI:  [1.7 L/min/m2-2.3 L/min/m2] 1.7 L/min/m2  ?Vent Mode: PRVC ?FiO2 (%):  [40 %] 40 % ?Set Rate:  [16 bmp] 16 bmp ?Vt Set:  [440 mL] 440 mL ?PEEP:  [5 cmH20] 5 cmH20 ?Plateau Pressure:  [14 cmH20-15 cmH20] 15 cmH20  ? ?Intake/Output Summary (Last 24 hours) at 02/06/2022 0911 ?Last data filed at 02/06/2022 0700 ?Gross per 24 hour  ?Intake 1336.82 ml  ?Output 1840 ml  ?Net -503.18 ml  ? ? ?Filed Weights  ? 02/04/22 0500 02/05/22 0500 02/06/22 0500  ?  Weight: 59.4 kg 59.7 kg 58.5 kg  ? ?Physical Exam  ?General: Acute on chronically ill appearing elderly female lying in bed on mechanical ventilation, in NAD ?HEENT: ETT, MM pink/moist, PERRL,   ?Neuro: Sedated on vent  ?CV: s1s2 regular rate and rhythm, no murmur, rubs, or gallops,  ?PULM:  Clear to ascultation, no increased work of breathing, no added breath sounds  ?GI: soft, bowel sounds active in all 4 quadrants, non-tender, non-distended, tolerating TF ?Extremities: warm/dry, no edema  ?Skin: no rashes or lesions ? ?Assessment & Plan:  ? ?CAD and NSTEMI with acute cardiogenic shock  ?-Reported increased exertional dyspnea for the last month. ?To Cath lab and Intubated 4/4. Severe 3 vessel disease with occluded Lcx-OM ?-S/p IABP ?P:  ?CTS and HF following appreciate assistance ?Taken for CABG and MVR 4/7 ?Strict intake and output ?Remains on low dose lasix drip  ?Closely monitor renal function and electrolytes  ?Remains on heaprin drip  ?IABP per HF ? ?Acute Hypoxic Respiratory Failure  ?-Secondary to pulmonary edema  ?P: ?Continue ventilator support with lung protective strategies  ?Wean PEEP and FiO2 for sats greater than 90%. ?Head of bed elevated 30 degrees. ?Plateau pressures less than 30 cm H20.  ?Follow intermittent chest x-ray and ABG.   ?SAT/SBT as tolerated, mentation preclude extubation  ?Ensure adequate pulmonary hygiene  ?Follow cultures  ?VAP bundle in place  ?PAD protocol ?Diurese as above  ? ?Hx frequent/chronic UTIs  ?- on daily Bactrim as prophylaxis ?-UA with trace leukocytes and rare bacteria 4/4 ?P: ?Remains on prophylactic Bactrim  ? ?Hx hypothyroidism ?P: ?Home synthroid  ? ?Hx HTN, HLD ?-Home medications includes Amlodipine ?P: ?Hold home Amlodipine until appropriate ?Continuous telemetry   ? ?Best practice (evaluated daily):  ?Diet/type: Regular consistency (see orders) ?DVT prophylaxis: systemic heparin ?GI prophylaxis: N/A ?Lines: N/A ?Foley:  Yes, and it is still needed ?Code Status:  full code ?Last date of multidisciplinary goals of care discussion:Family updated at bedside as patient was being prepped for surgery  ? ?Critical care time:  36 min.  ?Christiano Blandon D. Harris,  NP-C ?[facility] ?Personal contact information can be found on Amion  ?02/06/2022, 9:11 AM ? ? ? ? ?

## 2022-02-06 NOTE — Op Note (Signed)
?CARDIOVASCULAR SURGERY OPERATIVE NOTE ? ?02/06/2022 ? ?Surgeon:  Tonya Pollack, MD ? ?First Assistant: Jadene Pierini,  PA-C:   An experienced assistant was required given the complexity of this surgery and the standard of surgical care. The assistant was needed for endoscopic vein harvesting, exposure, dissection, suctioning, retraction of delicate tissues and sutures, instrument exchange and for overall help during this procedure. ? ? ?Preoperative Diagnosis:  Severe multi-vessel coronary artery disease and severe ischemic mitral regurgitation ? ? ?Postoperative Diagnosis:  Same ? ? ?Procedure: ? ?Median Sternotomy ?Extracorporeal circulation ?3.   Coronary artery bypass grafting x 4 ? ?Left internal mammary artery graft to the LAD ?SVG to diagonal ?SVG to OM ?SVG to distal RCA ?4.   Endoscopic vein harvest from the right and left legs ?5.   Chordal sparing mitral valve replacement using a 25 mm Mitris Resilia pericardial valve. ? ? ?Anesthesia:  General Endotracheal ? ? ?Clinical History/Surgical Indication: ? ?This 76 year old woman has left main and severe three-vessel coronary disease presenting with NSTEMI with severe mitral regurgitation and acute hypoxic respiratory failure with cardiogenic shock.  Her ejection fraction is 30 to 35% on echocardiogram and is likely lower given the fact that she has severe mitral regurgitation. She stabilized with IABP and milrinone 0.125, vent  support. She diuresed and filling pressures decreased. Oxygen requirement improved. It was felt that proceeding with CABG and MV repair/replacement was the best treatment. I discussed the operative procedure with the patient and family including alternatives, benefits and risks; including but not limited to bleeding, blood transfusion, infection, stroke, myocardial infarction, graft failure, heart block requiring a permanent pacemaker, organ  dysfunction, and death.  Tonya Hamilton  and her husband understand and agree to proceed.   ? ?Preparation: ? ?The patient was seen in the ICU and the correct patient, correct operation were confirmed with the patient after reviewing the medical record and catheterization. The consent was signed by me. Preoperative antibiotics were given. She was taken directly to the OR from the ICU intubated with the IABP in place. A pulmonary arterial line and radial arterial line were placed by the anesthesia team. She was placed under general endotracheal anesthesia by the anesthesia team. The neck, chest, abdomen, and both legs were prepped with betadine soap and solution and draped in the usual sterile manner. A surgical time-out was taken and the correct patient and operative procedure were confirmed with the nursing and anesthesia staff. ? ? ? ? *INTRAOPERATIVE TRANSESOPHAGEAL REPORT *  ? ?   ? ?Patient Name:   Tonya Hamilton Date of Exam: 02/06/2022  ?Medical Rec #:  [id number]        Height:       64.0 in  ?Accession #:    [id number]       Weight:       129.0 lb  ?Date of Birth:  [birth year]        BSA:          1.62 m?  ?Patient Age:    76 years         BP:           140/72 mmHg  ?Patient Gender: F                HR:           86 bpm.  ?Exam Location:  Inpatient  ? ?Transesophogeal exam was perform intraoperatively during surgical  ?procedure.  ?Patient was closely monitored under general anesthesia during the  entirety  ?of  ?examination.  ? ?Indications:     coronary artery bypass surgery  ?Performing Phys: [id number] Allie Ousley  ?Diagnosing Phys: Annye Asa MD  ? ?Complications: No known complications during this procedure.  ?POST-OP IMPRESSIONS  ?The patient separated easily from CPB with IABP and milrinone support.  ?_ Left Ventricle: The LV has moderately reduced systolic function. There  ?is  ?global hypokinesis, with some recovery of contractility and wall motion in  ?the  ?septal, inferior and lateral walls.  The posterior segment remains severely  ?hypokinetic. Overall EF 40-45%, augmented with IABP.  ?_ Right Ventricle: The right ventricular function appears, normal,  ?unchanged  ?from preop images.  ?_ Aortic Valve: The aortic valve function appears unchanged from preop  ?images.  ?_ Mitral Valve: A bioprosthetic valve was placed, leaflets are freely  ?mobile.  ?There is no regurgitation. The gradient recorded across the prosthetic  ?valve is  ?within the expected range, mean grad 3 mmHg.  ?_ Tricuspid Valve: The tricuspid valve function appears unchanged from  ?preop  ?images. There is mild-mod TR.  ? ?PRE-OP FINDINGS  ? Left Ventricle: The left ventricle has moderately reduced systolic  ?function, with an ejection fraction of 40-45%, measured 42%. The cavity  ?size was moderately dilated. Left ventricular diffuse, global hypokinesis.  ?There is severe hypokinesis of the left  ? ventricular lateral wall, and moderate hypokinesis of the left  ?ventricular septal wall. There is severe hypokinesis of the left  ?ventricular inferior wall as well. There is no left ventricular  ?hypertrophy. Left ventricular diastolic function was not  ?evaluated.  ? ?Right Ventricle: The right ventricle has normal systolic function. The  ?cavity was normal. There is no increase in right ventricular wall  ?thickness. Right ventricular systolic pressure is normal. Catheter present  ?in the right ventricle.  ? ?Left Atrium: Left atrial size was dilated. No left atrial/left atrial  ?appendage thrombus was detected. Left atrial appendage velocity is normal  ?at greater than 40 cm/s.  ? ?Right Atrium: Right atrial size was normal in size. Catheter present in  ?the right atrium.  ? ?Interatrial Septum: No atrial level shunt detected by color flow Doppler.  ?There is no evidence of a patent foramen ovale.  ? ?Pericardium: Trivial pericardial effusion is present.  ? ?Mitral Valve: The mitral valve is dilated. Mitral valve regurgitation is   ?moderate-severe by color flow Doppler. The MR jet is centrally-directed,  ?at P2, with MR vol by PISA 36m. There is no evidence of mitral valve  ?vegetation. Pulmonary venous flow is  ?normal, with no flow reversal evident. There is no evidence of mitral  ?stenosis, with peak grad 3 mmHg, mean grad 1 mmHg.  ? ?Tricuspid Valve: The tricuspid valve was normal in structure. Tricuspid  ?valve regurgitation is trivial by color flow Doppler. No evidence of  ?tricuspid stenosis is present. There is no evidence of tricuspid valve  ?vegetation.  ? ?Aortic Valve: The aortic valve is tricuspid. Aortic valve regurgitation is  ?trivial by color flow Doppler. There is no stenosis of the aortic valve,  ?with peak grad 4 mmHg, mean grad mmHg. There is no evidence of aortic  ?valve vegetation.  ? ?Pulmonic Valve: The pulmonic valve was normal in structure, with normal  ?leaftlet mobility. No evidence of pulmonic stenosis. Pulmonic valve  ?regurgitation is trivial, around the PA catheter, by color flow Doppler.  ? ? ?Aorta: The aortic root, ascending aorta and aortic arch are normal in size  ?and  structure. There is evidence of plaque in the descending aorta; Grade  ?III, measuring 3-72m in size. The IABP is in position in the descending  ?thoracic aorta.  ? ?Pulmonary Artery: SGordy Councilmancatheter present on the left. The pulmonary  ?artery is of normal size.  ? ?Venous: The inferior vena cava is normal in size with greater than 50%  ?respiratory variability, suggesting right atrial pressure of 3 mmHg.  ? ?Shunts: There is no evidence of an atrial septal defect.  ? ?+-------------+--------++  ?AORTIC VALVE           ?+-------------+--------++  ?AV Mean Grad:2.0 mmHg  ?+-------------+--------++  ? ?+-------------+--------++  ?MITRAL VALVE           ?+-------------+--------++  ?MV Mean grad:1.0 mmHg  ?+-------------+--------++  ? ?   ?CAnnye AsaMD  ?Electronically signed by CAnnye AsaMD  ?Signature  Date/Time: 02/06/2022/4:03:48 PM  ?   ? ? ?  Final   ? ? ?Cardiopulmonary Bypass: ? ?A median sternotomy was performed. The pericardium was opened in the midline. Right ventricular function appeared normal. The ascending aorta was of norma

## 2022-02-06 NOTE — Anesthesia Procedure Notes (Signed)
Arterial Line Insertion ?Start/End4/05/2022 9:00 AM, 02/06/2022 9:15 AM ?Performed by: Annye Asa, MD, Betha Loa, CRNA, CRNA ? Patient location: Pre-op. ?Preanesthetic checklist: patient identified, IV checked, site marked, risks and benefits discussed, surgical consent, monitors and equipment checked, pre-op evaluation, timeout performed and anesthesia consent ?Lidocaine 1% used for infiltration ?Left, radial was placed ?Catheter size: 20 G ?Hand hygiene performed  and maximum sterile barriers used  ? ?Attempts: 1 ?Procedure performed without using ultrasound guided technique. ?Following insertion, dressing applied and Biopatch. ?Post procedure assessment: normal and unchanged ? ?Patient tolerated the procedure well with no immediate complications. ? ? ?

## 2022-02-06 NOTE — Progress Notes (Addendum)
CI have been lower and labile between 1.7-2.0 overnight. Urine output was responsive to 40 mg of lasix IV push but has slowed down to 38m/hr. CVP is around 3/4. PA pressures are high teens over low teens. MAPs have been stable, levophed turned off. Discussed with cardiology and CCM to continue current care plan.  ? ?

## 2022-02-06 NOTE — Brief Op Note (Signed)
02/02/2022 - 02/06/2022 ? ?2:07 PM ? ?PATIENT:  Tonya Hamilton  76 y.o. female ? ?PRE-OPERATIVE DIAGNOSIS:  Coronary Artery Disease, Severe Mitral Valve Regurgitation ? ?POST-OPERATIVE DIAGNOSIS:  Coronary Artery Disease, Severe Mitral Valve Regurgitation ? ?PROCEDURE:  Procedure(s): ?CORONARY ARTERY BYPASS GRAFTING X4, Using Left and Right Greater Saphenous Vein Harvested Endoscopically (N/A) ?MITRAL VALVE (MV) REPLACEMENT Using 25MM Mitris Valve (N/A) ?TRANSESOPHAGEAL ECHOCARDIOGRAM (TEE) (N/A) ?Vein harvest time: 60mn Vein prep time: 247m ? ?SURGEON:  Surgeon(s) and Role: ?   * Bartle, BrFernande BoydenMD - Primary ? ?PHYSICIAN ASSISTANT: Dala Breault PA-C ? ?ASSISTANTS: STAFF  ? ?ANESTHESIA:   general ? ?EBL:  300 mL  ? ?BLOOD ADMINISTERED: 2 UNITS CC PRBC ? ?DRAINS:  LEFT PLEURAL AND MEDIASTINAL CHEST TUBES   ? ?LOCAL MEDICATIONS USED:  NONE ? ?SPECIMEN:  Source of Specimen:  PORTION OF MITRAL VALVE LEAFLET ? ?DISPOSITION OF SPECIMEN:  PATHOLOGY ? ?COUNTS:  YES ? ?TOURNIQUET:  * No tourniquets in log * ? ?DICTATION: .Dragon Dictation ? ?PLAN OF CARE: Admit to inpatient  ? ?PATIENT DISPOSITION:  ICU - intubated and hemodynamically stable. ?  ?Delay start of Pharmacological VTE agent (>24hrs) due to surgical blood loss or risk of bleeding: yes ? ?COMPLICATIONS: NO KNOWN ? ?

## 2022-02-06 NOTE — Progress Notes (Signed)
eLink Physician-Brief Progress Note ?Patient Name: Tonya Hamilton ?DOB: [birth year] ?MRN: [id number] ? ? ?Date of Service ? 02/06/2022  ?HPI/Events of Note ? Intermittent agitation with hypertension. Patient previously tolerated Precedex 1.2. Currently maxed on 0.7  ?eICU Interventions ? Increase precedex ceiling to 1.2  ? ? ? ?Intervention Category ?Minor Interventions: Agitation / anxiety - evaluation and management ? ?Bryley Kovacevic Rodman Pickle ?02/06/2022, 9:13 PM ?

## 2022-02-06 NOTE — Transfer of Care (Signed)
Immediate Anesthesia Transfer of Care Note ? ?Patient: Tonya Hamilton ? ?Procedure(s) Performed: CORONARY ARTERY BYPASS GRAFTING X4, Using Left and Right Greater Saphenous Vein Harvested Endoscopically (Chest) ?MITRAL VALVE (MV) REPLACEMENT Using 25MM Mitris Valve (Chest) ?TRANSESOPHAGEAL ECHOCARDIOGRAM (TEE) ? ?Patient Location: ICU ? ?Anesthesia Type:General ? ?Level of Consciousness: Patient remains intubated per anesthesia plan ? ?Airway & Oxygen Therapy: Patient remains intubated per anesthesia plan and Patient placed on Ventilator (see vital sign flow sheet for setting) ? ?Post-op Assessment: Report given to RN and Post -op Vital signs reviewed and stable ? ?Post vital signs: Reviewed and stable ? ?Last Vitals:  ?Vitals Value Taken Time  ?BP    ?Temp 36.2 ?C 02/06/22 1653  ?Pulse 89 02/06/22 1653  ?Resp 12 02/06/22 1653  ?SpO2 97 % 02/06/22 1653  ?Vitals shown include unvalidated device data. ? ?Last Pain:  ?Vitals:  ? 02/06/22 0400  ?TempSrc: Core  ?PainSc:   ?   ? ?  ? ?Complications: No notable events documented. ?

## 2022-02-06 NOTE — Anesthesia Procedure Notes (Signed)
Central Venous Catheter Insertion ?Performed by: Jackson, Carswell, MD, anesthesiologist ?Start/End4/05/2022 8:32 AM, 02/06/2022 8:49 AM ?Patient location: OR. ?Preanesthetic checklist: patient identified, IV checked, risks and benefits discussed, surgical consent, monitors and equipment checked, pre-op evaluation, timeout performed and anesthesia consent ?Position: Trendelenburg ?Patient sedated ?Hand hygiene performed , maximum sterile barriers used  and Seldinger technique used ?Catheter size: 9 Fr ?PA cath was placed.MAC introducer ?Swan type:thermodilution ?Procedure performed using ultrasound guided technique. ?Ultrasound Notes:anatomy identified, needle tip was noted to be adjacent to the nerve/plexus identified, no ultrasound evidence of intravascular and/or intraneural injection and image(s) printed for medical record ?Attempts: 1 ?Following insertion, line sutured and dressing applied. ?Post procedure assessment: blood return through all ports, free fluid flow and no air ? ?Patient tolerated the procedure well with no immediate complications. ?Additional procedure comments: PA catheter:  Routine monitors. Timeout, sterile prep, drape, FBP R neck.  Trendelenburg position.  finder and trocar RIJ 1st pass with US guidance. MAC introducer placed over J wire. PA catheter in easily.  Sterile dressing applied.  Patient tolerated well, VSS.  C Jackson, MD . ? ? ? ? ? ?

## 2022-02-06 NOTE — Anesthesia Postprocedure Evaluation (Signed)
Anesthesia Post Note ? ?Patient: Tonya Hamilton ? ?Procedure(s) Performed: CORONARY ARTERY BYPASS GRAFTING X4, Using Left and Right Greater Saphenous Vein Harvested Endoscopically (Chest) ?MITRAL VALVE (MV) REPLACEMENT Using 25MM Mitris Valve (Chest) ?TRANSESOPHAGEAL ECHOCARDIOGRAM (TEE) ? ?  ? ?Patient location during evaluation: SICU ?Anesthesia Type: General ?Level of consciousness: sedated ?Pain management: pain level controlled ?Vital Signs Assessment: post-procedure vital signs reviewed and stable ?Respiratory status: patient remains intubated per anesthesia plan ?Cardiovascular status: stable ?Postop Assessment: no apparent nausea or vomiting ?Anesthetic complications: no ? ? ?No notable events documented. ? ?Last Vitals:  ?Vitals:  ? 02/06/22 0758 02/06/22 1643  ?BP: (!) 130/35   ?Pulse: 62   ?Resp: 16   ?Temp:    ?SpO2: 100% 100%  ?  ?Last Pain:  ?Vitals:  ? 02/06/22 0400  ?TempSrc: Core  ?PainSc:   ? ? ?  ?  ?  ?  ?  ?  ? ?Tonya Hamilton ? ? ? ? ?

## 2022-02-07 ENCOUNTER — Encounter (HOSPITAL_COMMUNITY): Payer: Self-pay | Admitting: Surgery

## 2022-02-07 ENCOUNTER — Inpatient Hospital Stay (HOSPITAL_COMMUNITY): Payer: Medicare HMO

## 2022-02-07 DIAGNOSIS — I2510 Atherosclerotic heart disease of native coronary artery without angina pectoris: Secondary | ICD-10-CM | POA: Diagnosis not present

## 2022-02-07 DIAGNOSIS — J9601 Acute respiratory failure with hypoxia: Secondary | ICD-10-CM | POA: Diagnosis not present

## 2022-02-07 DIAGNOSIS — R570 Cardiogenic shock: Secondary | ICD-10-CM | POA: Diagnosis not present

## 2022-02-07 LAB — POCT I-STAT 7, (LYTES, BLD GAS, ICA,H+H)
Acid-base deficit: 1 mmol/L (ref 0.0–2.0)
Acid-base deficit: 3 mmol/L — ABNORMAL HIGH (ref 0.0–2.0)
Acid-base deficit: 3 mmol/L — ABNORMAL HIGH (ref 0.0–2.0)
Bicarbonate: 23.7 mmol/L (ref 20.0–28.0)
Bicarbonate: 22.2 mmol/L (ref 20.0–28.0)
Bicarbonate: 22 mmol/L (ref 20.0–28.0)
Calcium, Ion: 1.14 mmol/L — ABNORMAL LOW (ref 1.15–1.40)
Calcium, Ion: 1.1 mmol/L — ABNORMAL LOW (ref 1.15–1.40)
Calcium, Ion: 1.1 mmol/L — ABNORMAL LOW (ref 1.15–1.40)
HCT: 25 % — ABNORMAL LOW (ref 36.0–46.0)
HCT: 26 % — ABNORMAL LOW (ref 36.0–46.0)
HCT: 26 % — ABNORMAL LOW (ref 36.0–46.0)
Hemoglobin: 8.5 g/dL — ABNORMAL LOW (ref 12.0–15.0)
Hemoglobin: 8.8 g/dL — ABNORMAL LOW (ref 12.0–15.0)
Hemoglobin: 8.8 g/dL — ABNORMAL LOW (ref 12.0–15.0)
O2 Saturation: 99 %
O2 Saturation: 99 %
O2 Saturation: 91 %
Patient temperature: 36.5
Patient temperature: 36.8
Patient temperature: 37.1
Potassium: 4.2 mmol/L (ref 3.5–5.1)
Potassium: 3.8 mmol/L (ref 3.5–5.1)
Potassium: 4.2 mmol/L (ref 3.5–5.1)
Sodium: 141 mmol/L (ref 135–145)
Sodium: 141 mmol/L (ref 135–145)
Sodium: 141 mmol/L (ref 135–145)
TCO2: 25 mmol/L (ref 22–32)
TCO2: 23 mmol/L (ref 22–32)
TCO2: 23 mmol/L (ref 22–32)
pCO2 arterial: 37.7 mmHg (ref 32–48)
pCO2 arterial: 36.8 mmHg (ref 32–48)
pCO2 arterial: 36.5 mmHg (ref 32–48)
pH, Arterial: 7.404 (ref 7.35–7.45)
pH, Arterial: 7.387 (ref 7.35–7.45)
pH, Arterial: 7.389 (ref 7.35–7.45)
pO2, Arterial: 151 mmHg — ABNORMAL HIGH (ref 83–108)
pO2, Arterial: 129 mmHg — ABNORMAL HIGH (ref 83–108)
pO2, Arterial: 61 mmHg — ABNORMAL LOW (ref 83–108)

## 2022-02-07 LAB — GLUCOSE, CAPILLARY
Glucose-Capillary: 117 mg/dL — ABNORMAL HIGH (ref 70–99)
Glucose-Capillary: 128 mg/dL — ABNORMAL HIGH (ref 70–99)
Glucose-Capillary: 130 mg/dL — ABNORMAL HIGH (ref 70–99)
Glucose-Capillary: 140 mg/dL — ABNORMAL HIGH (ref 70–99)
Glucose-Capillary: 140 mg/dL — ABNORMAL HIGH (ref 70–99)
Glucose-Capillary: 140 mg/dL — ABNORMAL HIGH (ref 70–99)
Glucose-Capillary: 142 mg/dL — ABNORMAL HIGH (ref 70–99)
Glucose-Capillary: 144 mg/dL — ABNORMAL HIGH (ref 70–99)
Glucose-Capillary: 148 mg/dL — ABNORMAL HIGH (ref 70–99)
Glucose-Capillary: 151 mg/dL — ABNORMAL HIGH (ref 70–99)
Glucose-Capillary: 152 mg/dL — ABNORMAL HIGH (ref 70–99)
Glucose-Capillary: 160 mg/dL — ABNORMAL HIGH (ref 70–99)
Glucose-Capillary: 161 mg/dL — ABNORMAL HIGH (ref 70–99)
Glucose-Capillary: 172 mg/dL — ABNORMAL HIGH (ref 70–99)
Glucose-Capillary: 202 mg/dL — ABNORMAL HIGH (ref 70–99)

## 2022-02-07 LAB — BASIC METABOLIC PANEL
Anion gap: 7 (ref 5–15)
Anion gap: 8 (ref 5–15)
BUN: 22 mg/dL (ref 8–23)
BUN: 19 mg/dL (ref 8–23)
CO2: 23 mmol/L (ref 22–32)
CO2: 21 mmol/L — ABNORMAL LOW (ref 22–32)
Calcium: 7.4 mg/dL — ABNORMAL LOW (ref 8.9–10.3)
Calcium: 7.5 mg/dL — ABNORMAL LOW (ref 8.9–10.3)
Chloride: 110 mmol/L (ref 98–111)
Chloride: 109 mmol/L (ref 98–111)
Creatinine, Ser: 0.76 mg/dL (ref 0.44–1.00)
Creatinine, Ser: 0.94 mg/dL (ref 0.44–1.00)
GFR, Estimated: >60 mL/min (ref >60)
GFR, Estimated: >60 mL/min (ref >60)
Glucose, Bld: 136 mg/dL — ABNORMAL HIGH (ref 70–99)
Glucose, Bld: 177 mg/dL — ABNORMAL HIGH (ref 70–99)
Potassium: 4.2 mmol/L (ref 3.5–5.1)
Potassium: 4 mmol/L (ref 3.5–5.1)
Sodium: 140 mmol/L (ref 135–145)
Sodium: 138 mmol/L (ref 135–145)

## 2022-02-07 LAB — CBC
HCT: 26.7 % — ABNORMAL LOW (ref 36.0–46.0)
HCT: 27.8 % — ABNORMAL LOW (ref 36.0–46.0)
Hemoglobin: 8.8 g/dL — ABNORMAL LOW (ref 12.0–15.0)
Hemoglobin: 9.1 g/dL — ABNORMAL LOW (ref 12.0–15.0)
MCH: 29 pg (ref 26.0–34.0)
MCH: 29 pg (ref 26.0–34.0)
MCHC: 33 g/dL (ref 30.0–36.0)
MCHC: 32.7 g/dL (ref 30.0–36.0)
MCV: 88.1 fL (ref 80.0–100.0)
MCV: 88.5 fL (ref 80.0–100.0)
Platelets: 142 10*3/uL — ABNORMAL LOW (ref 150–400)
Platelets: 177 10*3/uL (ref 150–400)
RBC: 3.03 MIL/uL — ABNORMAL LOW (ref 3.87–5.11)
RBC: 3.14 MIL/uL — ABNORMAL LOW (ref 3.87–5.11)
RDW: 14.6 % (ref 11.5–15.5)
RDW: 14.9 % (ref 11.5–15.5)
WBC: 17.8 10*3/uL — ABNORMAL HIGH (ref 4.0–10.5)
WBC: 22.9 10*3/uL — ABNORMAL HIGH (ref 4.0–10.5)
nRBC: 0 % (ref 0.0–0.2)
nRBC: 0 % (ref 0.0–0.2)

## 2022-02-07 LAB — COOXEMETRY PANEL
Carboxyhemoglobin: 1.8 % — ABNORMAL HIGH (ref 0.5–1.5)
Methemoglobin: <0.7 % (ref 0.0–1.5)
O2 Saturation: 66 %
Total hemoglobin: 8.6 g/dL — ABNORMAL LOW (ref 12.0–16.0)

## 2022-02-07 LAB — MAGNESIUM
Magnesium: 3.2 mg/dL — ABNORMAL HIGH (ref 1.7–2.4)
Magnesium: 2.6 mg/dL — ABNORMAL HIGH (ref 1.7–2.4)

## 2022-02-07 MED ORDER — INSULIN ASPART 100 UNIT/ML IJ SOLN
0.0000 [IU] | INTRAMUSCULAR | Status: DC
  Administered 2022-02-07 – 2022-02-08 (×5): 2 [IU] via SUBCUTANEOUS

## 2022-02-07 MED ORDER — FAMOTIDINE IN NACL 20-0.9 MG/50ML-% IV SOLN
20.0000 mg | Freq: Once | INTRAVENOUS | Status: DC
  Filled 2022-02-07: qty 50

## 2022-02-07 MED ORDER — ORAL CARE MOUTH RINSE
15.0000 mL | Freq: Two times a day (BID) | OROMUCOSAL | Status: DC
  Administered 2022-02-07 – 2022-02-18 (×20): 15 mL via OROMUCOSAL

## 2022-02-07 MED ORDER — LEVOTHYROXINE SODIUM 25 MCG PO TABS
25.0000 ug | ORAL_TABLET | Freq: Every day | ORAL | Status: DC
  Administered 2022-02-08 – 2022-02-19 (×12): 25 ug via ORAL
  Filled 2022-02-07 (×12): qty 1

## 2022-02-07 MED ORDER — GABAPENTIN 300 MG PO CAPS
300.0000 mg | ORAL_CAPSULE | Freq: Two times a day (BID) | ORAL | Status: DC
  Administered 2022-02-07 (×2): 300 mg via ORAL
  Filled 2022-02-07 (×2): qty 1

## 2022-02-07 MED ORDER — OXYCODONE HCL 5 MG PO TABS
5.0000 mg | ORAL_TABLET | ORAL | Status: DC | PRN
  Administered 2022-02-07 – 2022-02-08 (×7): 10 mg
  Administered 2022-02-08: 5 mg
  Administered 2022-02-08 – 2022-02-09 (×2): 10 mg
  Filled 2022-02-07 (×9): qty 2

## 2022-02-07 MED ORDER — FUROSEMIDE 10 MG/ML IJ SOLN
40.0000 mg | Freq: Once | INTRAMUSCULAR | Status: AC
Start: 2022-02-07 — End: 2022-02-07
  Administered 2022-02-07: 40 mg via INTRAVENOUS
  Filled 2022-02-07: qty 4

## 2022-02-07 MED ORDER — FAMOTIDINE IN NACL 20-0.9 MG/50ML-% IV SOLN
INTRAVENOUS | Status: AC
  Filled 2022-02-07: qty 50

## 2022-02-07 MED ORDER — INSULIN DETEMIR 100 UNIT/ML ~~LOC~~ SOLN
20.0000 [IU] | Freq: Every day | SUBCUTANEOUS | Status: DC
  Administered 2022-02-07: 20 [IU] via SUBCUTANEOUS
  Filled 2022-02-07 (×2): qty 0.2

## 2022-02-07 MED ORDER — TRAMADOL HCL 50 MG PO TABS
50.0000 mg | ORAL_TABLET | ORAL | Status: DC | PRN
  Administered 2022-02-07 – 2022-02-09 (×6): 50 mg via ORAL
  Filled 2022-02-07 (×6): qty 1

## 2022-02-07 MED ORDER — FUROSEMIDE 10 MG/ML IJ SOLN
40.0000 mg | Freq: Once | INTRAMUSCULAR | Status: AC
  Administered 2022-02-07: 40 mg via INTRAVENOUS
  Filled 2022-02-07: qty 4

## 2022-02-07 MED ORDER — LIDOCAINE 5 % EX PTCH
2.0000 | MEDICATED_PATCH | CUTANEOUS | Status: DC
  Administered 2022-02-07 – 2022-02-09 (×3): 2 via TRANSDERMAL
  Filled 2022-02-07 (×3): qty 2

## 2022-02-07 NOTE — Progress Notes (Signed)
Patient ID: Tonya Hamilton, female   DOB: 08/13/1946, 76 y.o.   MRN: [id number] ?TCTS ? ?Milrinone 0.25 and NE 10 ?MAP 70's-80. CI 2.5 ? ?Temp to 101.1 this evening. Still on periop antibiotics and all old lines are out. ? ?UO ok ? ?Chest tubes removed ? ?Groin site ok after IABP removal. ? ?CBC ?   ?Component Value Date/Time  ? WBC 22.9 (H) 02/07/2022 1648  ? RBC 3.14 (L) 02/07/2022 1648  ? HGB 9.1 (L) 02/07/2022 1648  ? HCT 27.8 (L) 02/07/2022 1648  ? PLT 177 02/07/2022 1648  ? MCV 88.5 02/07/2022 1648  ? MCH 29.0 02/07/2022 1648  ? MCHC 32.7 02/07/2022 1648  ? RDW 14.9 02/07/2022 1648  ? LYMPHSABS 1.0 02/02/2022 2321  ? MONOABS 1.4 (H) 02/02/2022 2321  ? EOSABS 0.1 02/02/2022 2321  ? BASOSABS 0.0 02/02/2022 2321  ? ?BMET pending this evening ?

## 2022-02-07 NOTE — Progress Notes (Signed)
? ?NAME:  Tonya Hamilton, MRN:  [id number], DOB:  Jul 20, 1946, LOS: 4 ?ADMISSION DATE:  02/02/2022, CONSULTATION DATE:  02/03/22 ?REFERRING MD:  Christy Gentles CHIEF COMPLAINT:  Dyspnea  ? ?History of Present Illness:  ?Tonya Hamilton is a 76 y.o. female who has a PMH as outlined below.  She presented to [facility] ED 4/3 with dyspnea.  She states that symptoms began suddenly and progressed.  She also endorsed cough intimately productive of sputum.  No fevers/chills/sweats, chest pain, N/V/D, abdominal pain, myalgias, exposures known sick contacts, recent travel. ? ?In ED, she was found to be hypoxic.  CXR showed bilateral opacities and bilateral mild edema.  Troponin was elevated at 708 with repeat up to 1454.  BNP mildly elevated at 366.  EKG with ST depressions and global ischemia.  Leukocytosis to 23.6.  She was initially placed on Ventimask but was later upgraded to BiPAP.  She continued to require BiPAP which she did feel subjective improvement in symptoms.  Patient stable subsequently called for transfer to ICU at [facility] for further evaluation management. ? ?Pertinent  Medical History:  ?has History of colonic diverticulitis; Osteoarthritis; Hypertension; Hyperlipidemia; Hypothyroid; GERD (gastroesophageal reflux disease); Osteopenia; Anxiety; Osteoarthritis of right hip; Angioedema; Chronic intractable headache; Occipital neuralgia of left side; Neck pain; Hyperreflexia; Arthritis, multiple joint involvement; Spasmodic dysphonia; Acute respiratory failure ([place]); Respiratory failure ([place]); Community acquired pneumonia; Non-STEMI (non-ST elevated myocardial infarction) ([place]); Cardiogenic shock ([place]); Severe mitral regurgitation; Ischemic cardiomyopathy; Multiple vessel coronary artery disease; and S/P CABG x 4 on their problem list. ? ?Significant Hospital Events: ?Including procedures, antibiotic start and stop dates in addition to other pertinent events   ?4/4 admit, started on Cefepime and Azith and Bipap.   Trop rising, on heparin gtt,  Echo with reduced EF and regional wall motion abnormalities with severe MR, cardiology consulted and taken to cath lab which revealed severe LM and 3V disease.  IABP placed and intubated, in cardiogenic shock ?4/6 Lying in bed on IABP with progressive stability seen, plan for CABG with MVR tomorrow  ?4/7 OR to CABG and MVR, no issues overnight  ? ? ? ?Interim History / Subjective:  ?Minimal chest tube output since OR.  Maintaining cardiac index on milrinone and Levophed ? ?Objective:  ?Blood pressure 123/62, pulse 80, temperature 97.7 ?F (36.5 ?C), resp. rate (!) 24, height '5\' 4"'$  (1.626 m), weight 63.7 kg, SpO2 98 %. ?PAP: (24-38)/(13-28) 33/13 ?CVP:  [8 mmHg-20 mmHg] 14 mmHg ?CO:  [2.8 L/min-3.7 L/min] 2.9 L/min ?CI:  [1.7 L/min/m2-2.3 L/min/m2] 1.8 L/min/m2  ?Vent Mode: SIMV;PSV;PRVC ?FiO2 (%):  [40 %-50 %] 40 % ?Set Rate:  [12 bmp-16 bmp] 12 bmp ?Vt Set:  [440 mL] 440 mL ?PEEP:  [5 cmH20] 5 cmH20 ?Pressure Support:  [10 cmH20] 10 cmH20 ?Plateau Pressure:  [15 cmH20-17 cmH20] 17 cmH20  ? ?Intake/Output Summary (Last 24 hours) at 02/07/2022 0745 ?Last data filed at 02/07/2022 0700 ?Gross per 24 hour  ?Intake 5263.79 ml  ?Output 2760 ml  ?Net 2503.79 ml  ? ? ?Filed Weights  ? 02/05/22 0500 02/06/22 0500 02/07/22 0400  ?Weight: 59.7 kg 58.5 kg 63.7 kg  ? ?Physical Exam  ?General: Acute on chronically ill appearing elderly female lying in bed on mechanical ventilation, in NAD ?HEENT: ETT, MM pink/moist, PERRL,  ?Neuro: Sedated on vent.  Awakens readily, follows commands.  Tracks to voice.  No significant pain ?CV: Heart sounds masked by balloon pump.  Extremities warm and well-perfused.  Tolerating balloon pump at 1: 2.  Currently paced at 90.  Underlying rhythm is sinus at 75. ?PULM:  Clear to ascultation, no increased work of breathing, no added breath sounds  ?GI: soft, bowel sounds active in all 4 quadrants, non-tender, non-distended, tolerating TF ?Extremities: warm/dry, no edema  ?Skin:  no rashes or lesions ? ?Assessment & Plan:  ? ?Critically ill due to acute mixed cardiogenic and distributive shock following cardiac surgery requiring titration of norepinephrine, milrinone and intra-aortic balloon counterpulsation support. ?Coronary artery disease status post bypass. ?Severe ischemic MR status post MVR. ?Acute hypoxic respiratory failure secondary to acute cardiogenic pulmonary edema. ?Hypothyroidism ?Hypertension ?Dyslipidemia ?Chronic UTIs by history. ? ?Plan: ? ?-Wean mechanical ventilation and sedation per protocol to extubation. ?-Leave IABP on 1: 2, plan to remove once extubated. ?-Wean norepinephrine to keep MAP greater than 65. ?-Wean milrinone to keep cardiac index greater than 2.2. ?-Start multimodal pain control. ? ?Best practice (evaluated daily):  ?Diet/type: Regular consistency (see orders) ?DVT prophylaxis: prophylactic heparin  ?GI prophylaxis: H2B ?Lines: N/A ?Foley:  Yes, and it is still needed ?Code Status:  full code ?Last date of multidisciplinary goals of care discussion:Family updated at bedside as patient was being prepped for surgery  ? ?CRITICAL CARE ?Performed by: Kipp Brood ? ? ?Total critical care time: 40 minutes ? ?Critical care time was exclusive of separately billable procedures and treating other patients. ? ?Critical care was necessary to treat or prevent imminent or life-threatening deterioration. ? ?Critical care was time spent personally by me on the following activities: development of treatment plan with patient and/or surrogate as well as nursing, discussions with consultants, evaluation of patient's response to treatment, examination of patient, obtaining history from patient or surrogate, ordering and performing treatments and interventions, ordering and review of laboratory studies, ordering and review of radiographic studies, pulse oximetry, re-evaluation of patient's condition and participation in multidisciplinary rounds. ? ?Kipp Brood, MD  [facility] ?ICU Physician ?[place]  ?Pager: [phone] ?Mobile: [phone] ?After hours: [phone]. ? ? ?02/07/2022, 7:45 AM ? ? ? ? ?

## 2022-02-07 NOTE — Progress Notes (Signed)
Patient ID: Tonya Hamilton, female   DOB: November 19, 1945, 76 y.o.   MRN: [id number] ?TCTS  ? ?IABP removed without difficulty. Pressure held for 20 minutes and hemostatic. Palpable left dp and pt pulses after pressure held. Will keep at bedrest for 4 hrs, then she may dangle. ?

## 2022-02-07 NOTE — Procedures (Signed)
Extubation Procedure Note ? ?Patient Details:   ?Name: Tonya Hamilton ?DOB: Aug 23, 1946 ?MRN: [id number] ?  ?Airway Documentation:  ?  ?Vent end date: 02/07/22 Vent end time: 0906  ? ?Evaluation ? O2 sats: stable throughout ?Complications: No apparent complications ?Patient did tolerate procedure well. ?Bilateral Breath Sounds: Clear, Diminished ?  ?Yes ? ?Patient extubated per rapid wean protocol. Positive cuff leak. NIF -21, VC 580 ml. Vitals are stable on 3L [place]. RN at bedside. ? Herbie Saxon Kelin Borum ?02/07/2022, 9:07 AM ? ?

## 2022-02-07 NOTE — Progress Notes (Signed)
? ? Advanced Heart Failure Rounding Note ? ?PCP-Cardiologist: None  ? ?Subjective:   ? ?76 y/o with severe CAD/NSTEMI and iCM EF 30-35%. Developed refractory angina and shock with respiratory failure. Intubated and IABP placed.  ?4/7 CABG + MVR  ? ?Still on vent. Remains on milrinone 0.25 and NE 7. IABP 1:1. Hgb 8.3  CTs ok  ? ?RA 12 ?PA 30/18 ?PCWP 17 ?Thermo 3.0/1.8 ?Co-ox 66% (Fick CI = 2.2) ? ?Objective:   ?Weight Range: ?63.7 kg ?Body mass index is 24.11 kg/m?.  ? ?Vital Signs:   ?Temp:  [91.6 ?F (33.1 ?C)-98.2 ?F (36.8 ?C)] 98.2 ?F (36.8 ?C) (04/08 0900) ?Pulse Rate:  [45-263] 103 (04/08 0900) ?Resp:  [12-27] 18 (04/08 0900) ?BP: (95-162)/(48-68) 95/48 (04/08 1000) ?SpO2:  [93 %-100 %] 99 % (04/08 0906) ?Arterial Line BP: (110-206)/(24-65) 132/43 (04/08 0900) ?FiO2 (%):  [40 %-50 %] 40 % (04/08 0832) ?Weight:  [63.7 kg] 63.7 kg (04/08 0400) ?Last BM Date : 02/03/22 ? ?Weight change: ?Filed Weights  ? 02/05/22 0500 02/06/22 0500 02/07/22 0400  ?Weight: 59.7 kg 58.5 kg 63.7 kg  ? ? ?Intake/Output:  ? ?Intake/Output Summary (Last 24 hours) at 02/07/2022 1004 ?Last data filed at 02/07/2022 1000 ?Gross per 24 hour  ?Intake 5488.51 ml  ?Output 2960 ml  ?Net 2528.51 ml  ? ?  ? ? ?Physical Exam  ? ?General:  Awake on vent No resp difficulty ?HEENT: normal + ETT ?Neck: supple. RIJ swan ?Cor: Sternal dressing. + CTs Regular ?Lungs: coarse ?Abdomen: soft, nontender, Hypoactive bowel sounds. ?Extremities: no cyanosis, clubbing, rash, 1+  edema LFA IABP ?Neuro: awake on vent moves all 4 ? ? ?Telemetry  ? ?A-Paced 90 bpm. Underlying sinus 70s Personally reviewed ? ?Labs  ?  ?CBC ?Recent Labs  ?  02/06/22 ?2208 02/07/22 ?0422 02/07/22 ?5638  ?WBC 17.7* 17.8*  --   ?HGB 8.9* 8.8* 8.5*  ?HCT 27.7* 26.7* 25.0*  ?MCV 89.6 88.1  --   ?PLT 120* 142*  --   ? ? ?Basic Metabolic Panel ?Recent Labs  ?  02/05/22 ?1533 02/06/22 ?0355 02/06/22 ?2208 02/07/22 ?0422 02/07/22 ?9373  ?NA 144 145 140 140 141  ?K 4.0 3.6 4.9 4.2 4.2  ?CL 106 106  111 110  --   ?CO2 31 32 24 23  --   ?GLUCOSE 179* 140* 161* 136*  --   ?BUN 35* 39* 24* 22  --   ?CREATININE 0.96 0.84 0.74 0.76  --   ?CALCIUM 8.0* 8.2* 7.2* 7.4*  --   ?MG 2.2 2.4 3.6* 3.2*  --   ?PHOS 3.5 3.4  --   --   --   ? ? ?Liver Function Tests ?Recent Labs  ?  02/05/22 ?1533 02/06/22 ?0355  ?AST 43* 33  ?ALT 30 29  ?ALKPHOS 58 60  ?BILITOT 0.8 0.5  ?PROT 5.7* 5.8*  ?ALBUMIN 2.6* 2.5*  ? ? ?No results for input(s): LIPASE, AMYLASE in the last 72 hours. ?Cardiac Enzymes ?No results for input(s): CKTOTAL, CKMB, CKMBINDEX, TROPONINI in the last 72 hours. ? ?BNP: ?BNP (last 3 results) ?Recent Labs  ?  02/02/22 ?2321  ?BNP 366.4*  ? ? ? ?ProBNP (last 3 results) ?No results for input(s): PROBNP in the last 8760 hours. ? ? ?D-Dimer ?No results for input(s): DDIMER in the last 72 hours. ?Hemoglobin A1C ?No results for input(s): HGBA1C in the last 72 hours. ? ?Fasting Lipid Panel ?No results for input(s): CHOL, HDL, LDLCALC, TRIG, CHOLHDL, LDLDIRECT in the last  72 hours. ? ?Thyroid Function Tests ?No results for input(s): TSH, T4TOTAL, T3FREE, THYROIDAB in the last 72 hours. ? ?Invalid input(s): FREET3 ? ? ?Other results: ? ? ?Imaging  ? ? ?DG Chest Port 1 View ? ?Result Date: 02/07/2022 ?CLINICAL DATA:  Status post CABG EXAM: PORTABLE CHEST 1 VIEW COMPARISON:  Yesterday FINDINGS: Endotracheal tube with tip just below the clavicular heads. An enteric tube reaches the stomach. Multiple chest drains in place. Swan-Ganz catheter from the right with tip the pulmonary artery outflow level. Aortic balloon pump with tip 6.2 cm below the top of the aortic knob. CABG and mitral valve replacement. Mild atelectasis and small volume pleural fluid. Worsening aeration at the left base where the diaphragm is no longer seen. No visible pneumothorax. IMPRESSION: Unchanged hardware positioning. Mild increase in atelectasis at the left base. No visible pneumothorax. Electronically Signed   By: Jorje Guild M.D.   On: 02/07/2022  07:28  ? ?DG Chest Port 1 View ? ?Result Date: 02/06/2022 ?CLINICAL DATA:  Status post CABG and valve replacement. EXAM: PORTABLE CHEST 1 VIEW COMPARISON:  One-view chest x-ray 02/06/2022 at 12:35 a.m. FINDINGS: One interval median sternotomy noted. Aortic valve replacement noted. Endotracheal tube terminates 5.5 cm above the carina. Bilateral chest tubes and mediastinal drain are now in place. No pneumothorax is present. The femoral Swan-Ganz catheter was removed. A new right IJ Swan-Ganz catheter terminates at the main pulmonary outflow tract. The heart is mildly enlarged. Atherosclerotic changes are present at the aortic arch. Mild pulmonary vascular congestion is present without frank edema. Small effusions are present. IMPRESSION: 1. Interval median sternotomy. 2. Support apparatus as described. 3. No radiographic evidence for complication. 4. Mild pulmonary vascular congestion and small bilateral pleural effusions. Electronically Signed   By: San Morelle M.D.   On: 02/06/2022 17:22  ? ?VAS US DOPPLER PRE CABG ? ?Result Date: 02/05/2022 ?PREOPERATIVE VASCULAR EVALUATION Patient Name:  Tonya Hamilton  Date of Exam:   02/05/2022 Medical Rec #: [id number]         Accession #:    [id number] Date of Birth: 02-05-1946         Patient Gender: F Patient Age:   19 years Exam [facility]  [facility] Procedure:      VAS US DOPPLER PRE CABG Referring Phys: Gilford Raid --------------------------------------------------------------------------------  Indications:      Pre-CABG. Risk Factors:     Hypertension, hyperlipidemia. Limitations:      IABP Comparison Study: No prior studies. Performing Technologist: Carlos Levering RVT  Examination Guidelines: A complete evaluation includes B-mode imaging, spectral Doppler, color Doppler, and power Doppler as needed of all accessible portions of each vessel. Bilateral testing is considered an integral part of a complete examination. Limited examinations for reoccurring  indications may be performed as noted.  Right Carotid Findings: +----------+--------+--------+--------+-----------------------+--------+           PSV cm/sEDV cm/sStenosisDescribe               Comments +----------+--------+--------+--------+-----------------------+--------+ CCA Prox                          smooth and heterogenous         +----------+--------+--------+--------+-----------------------+--------+ CCA Distal                        smooth and heterogenous         +----------+--------+--------+--------+-----------------------+--------+ ICA Prox  calcific                        +----------+--------+--------+--------+-----------------------+--------+ ICA Distal                                               tortuous +----------+--------+--------+--------+-----------------------+--------+ +---------+--------+--------+---------+ VertebralPSV cm/sEDV cm/sAntegrade +---------+--------+--------+---------+ Left Carotid Findings: +----------+--------+--------+--------+-----------------------+--------+           PSV cm/sEDV cm/sStenosisDescribe               Comments +----------+--------+--------+--------+-----------------------+--------+ CCA Prox                          smooth and heterogenous         +----------+--------+--------+--------+-----------------------+--------+ CCA Distal                        smooth and heterogenous         +----------+--------+--------+--------+-----------------------+--------+ ICA Prox                          calcific                        +----------+--------+--------+--------+-----------------------+--------+ ICA Distal                                               tortuous +----------+--------+--------+--------+-----------------------+--------+ +---------+--------+--------+---------+ VertebralPSV cm/sEDV cm/sAntegrade +---------+--------+--------+---------+  ABI Findings:  +--------+------------------+-----+--------+--------+ Right   Rt Pressure (mmHg)IndexWaveformComment  +--------+------------------+-----+--------+--------+ [id number]                                     +--------+------------------+-----+--------+--------+ PTA     113               0.[place]

## 2022-02-07 NOTE — Progress Notes (Signed)
1 Day Post-Op Procedure(s) (LRB): ?CORONARY ARTERY BYPASS GRAFTING X4, Using Left and Right Greater Saphenous Vein Harvested Endoscopically (N/A) ?MITRAL VALVE (MV) REPLACEMENT Using 25MM Mitris Valve (N/A) ?TRANSESOPHAGEAL ECHOCARDIOGRAM (TEE) (N/A) ?Subjective: ? ?Intubated and still on low dose Precedex. Had a stable night. CI 1.8 this am with Co-ox 66% on milrinone 0.25 and NE 7. IABP now on 1:2. PA pressures 28/17, CVP 13, SVR 1798. ? ?Objective: ?Vital signs in last 24 hours: ?Temp:  [91.6 ?F (33.1 ?C)-98.2 ?F (36.8 ?C)] 97.9 ?F (36.6 ?C) (04/08 0745) ?Pulse Rate:  [80-263] 108 (04/08 0745) ?Cardiac Rhythm: Atrial paced (04/08 0400) ?Resp:  [12-27] 13 (04/08 0745) ?BP: (121-162)/(50-66) 125/61 (04/08 0745) ?SpO2:  [93 %-100 %] 99 % (04/08 0745) ?Arterial Line BP: (110-206)/(24-65) 122/43 (04/08 0745) ?FiO2 (%):  [40 %-50 %] 40 % (04/08 0733) ?Weight:  [63.7 kg] 63.7 kg (04/08 0400) ? ?Hemodynamic parameters for last 24 hours: ?PAP: (24-38)/(13-28) 28/17 ?CVP:  [8 mmHg-20 mmHg] 13 mmHg ?CO:  [2.8 L/min-3.7 L/min] 2.9 L/min ?CI:  [1.7 L/min/m2-2.3 L/min/m2] 1.8 L/min/m2 ? ?Intake/Output from previous day: ?04/07 0701 - 04/08 0700 ?In: 5263.8 [I.V.:4018.8; Blood:150; NG/GT:60; IV Piggyback:1035] ?Out: 2760 [Urine:1930; Blood:300; Chest Tube:530] ?Intake/Output this shift: ?Total I/O ?In: -  ?Out: 30 [Chest Tube:30] ? ?General appearance: calm on vent ?Neurologic: intact ?Heart: regular rate and rhythm, S1, S2 normal, no murmur ?Lungs: clear to auscultation bilaterally ?Abdomen: soft, non-tender, few BS ?Extremities: edema mild ?Wound: dressing dry ? ?Lab Results: ?Recent Labs  ?  02/06/22 ?2208 02/07/22 ?0422 02/07/22 ?4401  ?WBC 17.7* 17.8*  --   ?HGB 8.9* 8.8* 8.5*  ?HCT 27.7* 26.7* 25.0*  ?PLT 120* 142*  --   ? ?BMET:  ?Recent Labs  ?  02/06/22 ?2208 02/07/22 ?0422 02/07/22 ?0272  ?NA 140 140 141  ?K 4.9 4.2 4.2  ?CL 111 110  --   ?CO2 24 23  --   ?GLUCOSE 161* 136*  --   ?BUN 24* 22  --   ?CREATININE 0.74  0.76  --   ?CALCIUM 7.2* 7.4*  --   ?  ?PT/INR:  ?Recent Labs  ?  02/06/22 ?1706  ?LABPROT 16.1*  ?INR 1.3*  ? ?ABG ?   ?Component Value Date/Time  ? PHART 7.404 02/07/2022 0607  ? HCO3 23.7 02/07/2022 0607  ? TCO2 25 02/07/2022 0607  ? ACIDBASEDEF 1.0 02/07/2022 0607  ? O2SAT 99 02/07/2022 0607  ? ?CBG (last 3)  ?Recent Labs  ?  02/07/22 ?5366 02/07/22 ?4403 02/07/22 ?4742  ?GLUCAP 128* 117* 172*  ? ?CXR: mild left base atelectasis ? ?ECG: sinus 74, 1st degree AV block, no acute changes. ST changes have resolved. ? ?Assessment/Plan: ?S/P Procedure(s) (LRB): ?CORONARY ARTERY BYPASS GRAFTING X4, Using Left and Right Greater Saphenous Vein Harvested Endoscopically (N/A) ?MITRAL VALVE (MV) REPLACEMENT Using 25MM Mitris Valve (N/A) ?TRANSESOPHAGEAL ECHOCARDIOGRAM (TEE) (N/A) ? ?POD 1 ?Hemodynamically stable on milrinone and NE, IABP. Plan to wean to extubate and then will decrease IABP to 1:3 and plan to remove later today if things remain stable. No BB until stable off inotropes. ? ?DC insulin drip and transition to SSI and Levemir. No hx of DM and preop Hgb A1c 5.7.  ? ?Keep chest tubes in for now. ? ? LOS: 4 days  ? ? ?[name] ?02/07/2022 ? ? ?

## 2022-02-08 ENCOUNTER — Inpatient Hospital Stay (HOSPITAL_COMMUNITY): Payer: Medicare HMO

## 2022-02-08 DIAGNOSIS — J9601 Acute respiratory failure with hypoxia: Secondary | ICD-10-CM | POA: Diagnosis not present

## 2022-02-08 DIAGNOSIS — R570 Cardiogenic shock: Secondary | ICD-10-CM | POA: Diagnosis not present

## 2022-02-08 LAB — CBC
HCT: 27.8 % — ABNORMAL LOW (ref 36.0–46.0)
Hemoglobin: 8.8 g/dL — ABNORMAL LOW (ref 12.0–15.0)
MCH: 28.4 pg (ref 26.0–34.0)
MCHC: 31.7 g/dL (ref 30.0–36.0)
MCV: 89.7 fL (ref 80.0–100.0)
Platelets: 215 10*3/uL (ref 150–400)
RBC: 3.1 MIL/uL — ABNORMAL LOW (ref 3.87–5.11)
RDW: 15 % (ref 11.5–15.5)
WBC: 23.5 10*3/uL — ABNORMAL HIGH (ref 4.0–10.5)
nRBC: 0 % (ref 0.0–0.2)

## 2022-02-08 LAB — GLUCOSE, CAPILLARY
Glucose-Capillary: 149 mg/dL — ABNORMAL HIGH (ref 70–99)
Glucose-Capillary: 151 mg/dL — ABNORMAL HIGH (ref 70–99)
Glucose-Capillary: 159 mg/dL — ABNORMAL HIGH (ref 70–99)
Glucose-Capillary: 148 mg/dL — ABNORMAL HIGH (ref 70–99)

## 2022-02-08 LAB — COOXEMETRY PANEL
Carboxyhemoglobin: 2.1 % — ABNORMAL HIGH (ref 0.5–1.5)
Carboxyhemoglobin: 1.5 % (ref 0.5–1.5)
Methemoglobin: <0.7 % (ref 0.0–1.5)
Methemoglobin: <0.7 % (ref 0.0–1.5)
O2 Saturation: 68 %
O2 Saturation: 66.8 %
Total hemoglobin: 8.7 g/dL — ABNORMAL LOW (ref 12.0–16.0)
Total hemoglobin: 9.6 g/dL — ABNORMAL LOW (ref 12.0–16.0)

## 2022-02-08 LAB — CULTURE, BLOOD (ROUTINE X 2)
Culture: NO GROWTH
Culture: NO GROWTH
Special Requests: ADEQUATE
Special Requests: ADEQUATE

## 2022-02-08 LAB — POCT I-STAT 7, (LYTES, BLD GAS, ICA,H+H)
Acid-Base Excess: 0 mmol/L (ref 0.0–2.0)
Bicarbonate: 25.7 mmol/L (ref 20.0–28.0)
Calcium, Ion: 1.17 mmol/L (ref 1.15–1.40)
HCT: 29 % — ABNORMAL LOW (ref 36.0–46.0)
Hemoglobin: 9.9 g/dL — ABNORMAL LOW (ref 12.0–15.0)
O2 Saturation: 99 %
Patient temperature: 36.3
Potassium: 3.9 mmol/L (ref 3.5–5.1)
Sodium: 142 mmol/L (ref 135–145)
TCO2: 27 mmol/L (ref 22–32)
pCO2 arterial: 43.9 mmHg (ref 32–48)
pH, Arterial: 7.372 (ref 7.35–7.45)
pO2, Arterial: 126 mmHg — ABNORMAL HIGH (ref 83–108)

## 2022-02-08 LAB — BASIC METABOLIC PANEL
Anion gap: 7 (ref 5–15)
BUN: 18 mg/dL (ref 8–23)
CO2: 24 mmol/L (ref 22–32)
Calcium: 7.7 mg/dL — ABNORMAL LOW (ref 8.9–10.3)
Chloride: 107 mmol/L (ref 98–111)
Creatinine, Ser: 0.93 mg/dL (ref 0.44–1.00)
GFR, Estimated: >60 mL/min (ref >60)
Glucose, Bld: 143 mg/dL — ABNORMAL HIGH (ref 70–99)
Potassium: 4.1 mmol/L (ref 3.5–5.1)
Sodium: 138 mmol/L (ref 135–145)

## 2022-02-08 MED ORDER — POTASSIUM CHLORIDE CRYS ER 20 MEQ PO TBCR
20.0000 meq | EXTENDED_RELEASE_TABLET | Freq: Two times a day (BID) | ORAL | Status: AC
  Administered 2022-02-08 (×2): 20 meq via ORAL
  Filled 2022-02-08 (×2): qty 1

## 2022-02-08 MED ORDER — FUROSEMIDE 10 MG/ML IJ SOLN
10.0000 mg/h | INTRAVENOUS | Status: DC
  Administered 2022-02-08: 6 mg/h via INTRAVENOUS
  Administered 2022-02-09: 12 mg/h via INTRAVENOUS
  Administered 2022-02-10 (×2): 10 mg/h via INTRAVENOUS
  Filled 2022-02-08 (×4): qty 20

## 2022-02-08 MED ORDER — ROSUVASTATIN CALCIUM 5 MG PO TABS
5.0000 mg | ORAL_TABLET | Freq: Every day | ORAL | Status: DC
  Administered 2022-02-09 – 2022-02-19 (×11): 5 mg via ORAL
  Filled 2022-02-08 (×11): qty 1

## 2022-02-08 MED ORDER — MILRINONE LACTATE IN DEXTROSE 20-5 MG/100ML-% IV SOLN
0.1250 ug/kg/min | INTRAVENOUS | Status: DC
  Administered 2022-02-08: 0.125 ug/kg/min via INTRAVENOUS

## 2022-02-08 MED ORDER — INSULIN DETEMIR 100 UNIT/ML ~~LOC~~ SOLN
10.0000 [IU] | Freq: Every day | SUBCUTANEOUS | Status: DC
  Administered 2022-02-08 – 2022-02-16 (×9): 10 [IU] via SUBCUTANEOUS
  Filled 2022-02-08 (×10): qty 0.1

## 2022-02-08 MED ORDER — FUROSEMIDE 10 MG/ML IJ SOLN
40.0000 mg | Freq: Two times a day (BID) | INTRAMUSCULAR | Status: DC
  Administered 2022-02-08: 40 mg via INTRAVENOUS
  Filled 2022-02-08: qty 4

## 2022-02-08 MED ORDER — INSULIN ASPART 100 UNIT/ML IJ SOLN
0.0000 [IU] | Freq: Three times a day (TID) | INTRAMUSCULAR | Status: DC
  Administered 2022-02-08: 2 [IU] via SUBCUTANEOUS
  Administered 2022-02-08 – 2022-02-09 (×2): 3 [IU] via SUBCUTANEOUS
  Administered 2022-02-09: 2 [IU] via SUBCUTANEOUS
  Administered 2022-02-10 – 2022-02-11 (×5): 3 [IU] via SUBCUTANEOUS
  Administered 2022-02-11 – 2022-02-16 (×13): 2 [IU] via SUBCUTANEOUS
  Administered 2022-02-16 – 2022-02-17 (×2): 3 [IU] via SUBCUTANEOUS
  Administered 2022-02-17: 2 [IU] via SUBCUTANEOUS

## 2022-02-08 MED ORDER — ENOXAPARIN SODIUM 40 MG/0.4ML IJ SOSY
40.0000 mg | PREFILLED_SYRINGE | Freq: Every day | INTRAMUSCULAR | Status: DC
  Administered 2022-02-08 – 2022-02-16 (×9): 40 mg via SUBCUTANEOUS
  Filled 2022-02-08 (×9): qty 0.4

## 2022-02-08 NOTE — Progress Notes (Signed)
? ? Advanced Heart Failure Rounding Note ? ?PCP-Cardiologist: None  ? ?Subjective:   ? ?76 y/o with severe CAD/NSTEMI and iCM EF 30-35%. Developed refractory angina and shock with respiratory failure. Intubated and IABP placed.  ?4/7 CABG + MVR  ? ?Extubated yesterday. IABP out.  ? ?Co-ox 68% On NE 7 . Milrinone turned down 0.25 -> 0.125.  ? ?Feels weak. Denies CP or SOB. Mild CT drainage. Had fever to 101 yesterday.. ? ?Swan #s ?PAP: (30-43)/(15-26) 35/18 ?CVP:  [11 mmHg-21 mmHg] 14 mmHg  ? ?Objective:   ?Weight Range: ?66.8 kg ?Body mass index is 25.28 kg/m?.  ? ?Vital Signs:   ?Temp:  [99.1 ?F (37.3 ?C)-101.1 ?F (38.4 ?C)] 99.1 ?F (37.3 ?C) (04/09 0800) ?Pulse Rate:  [90-109] 96 (04/09 1000) ?Resp:  [10-26] 17 (04/09 1000) ?BP: (108-140)/(45-74) 126/59 (04/09 1000) ?SpO2:  [91 %-100 %] 94 % (04/09 1000) ?Arterial Line BP: (83-200)/(45-78) 95/58 (04/09 1000) ?Weight:  [66.8 kg] 66.8 kg (04/09 0500) ?Last BM Date : 02/03/22 ? ?Weight change: ?Filed Weights  ? 02/06/22 0500 02/07/22 0400 02/08/22 0500  ?Weight: 58.5 kg 63.7 kg 66.8 kg  ? ? ?Intake/Output:  ? ?Intake/Output Summary (Last 24 hours) at 02/08/2022 1017 ?Last data filed at 02/08/2022 1014 ?Gross per 24 hour  ?Intake 2148.55 ml  ?Output 2240 ml  ?Net -91.45 ml  ? ?  ? ? ?Physical Exam  ? ?General:  Sitting up No resp difficulty ?HEENT: normal ?Neck: supple. JVP to jaw Carotids 2+ bilat; no bruits. No lymphadenopathy or thryomegaly appreciated. ?Cor: Sternal dressing and CTs ok. Regular ?Lungs: clear ?Abdomen: soft, nontender, nondistended.Hypoactive bowel sounds. ?Extremities: no cyanosis, clubbing, rash, 1+ edema ?Neuro: alert & orientedx3, cranial nerves grossly intact. moves all 4 extremities w/o difficulty. Affect pleasant ? ?Telemetry  ? ?Sinus 90s Personally reviewed ? ?Labs  ?  ?CBC ?Recent Labs  ?  02/07/22 ?1648 02/08/22 ?0315  ?WBC 22.9* 23.5*  ?HGB 9.1* 8.8*  ?HCT 27.8* 27.8*  ?MCV 88.5 89.7  ?PLT 177 215  ? ? ?Basic Metabolic Panel ?Recent Labs   ?  02/05/22 ?1533 02/06/22 ?1856 02/06/22 ?1711 02/07/22 ?0422 02/07/22 ?3149 02/07/22 ?7026 02/08/22 ?0315  ?NA 144 145   < > 140   < > 138 138  ?K 4.0 3.6   < > 4.2   < > 4.0 4.1  ?CL 106 106   < > 110  --  109 107  ?CO2 31 32   < > 23  --  21* 24  ?GLUCOSE 179* 140*   < > 136*  --  177* 143*  ?BUN 35* 39*   < > 22  --  19 18  ?CREATININE 0.96 0.84   < > 0.76  --  0.94 0.93  ?CALCIUM 8.0* 8.2*   < > 7.4*  --  7.5* 7.7*  ?MG 2.2 2.4   < > 3.2*  --  2.6*  --   ?PHOS 3.5 3.4  --   --   --   --   --   ? < > = values in this interval not displayed.  ? ? ?Liver Function Tests ?Recent Labs  ?  02/05/22 ?1533 02/06/22 ?0355  ?AST 43* 33  ?ALT 30 29  ?ALKPHOS 58 60  ?BILITOT 0.8 0.5  ?PROT 5.7* 5.8*  ?ALBUMIN 2.6* 2.5*  ? ? ?No results for input(s): LIPASE, AMYLASE in the last 72 hours. ?Cardiac Enzymes ?No results for input(s): CKTOTAL, CKMB, CKMBINDEX, TROPONINI in the last 72 hours. ? ?  BNP: ?BNP (last 3 results) ?Recent Labs  ?  02/02/22 ?2321  ?BNP 366.4*  ? ? ? ?ProBNP (last 3 results) ?No results for input(s): PROBNP in the last 8760 hours. ? ? ?D-Dimer ?No results for input(s): DDIMER in the last 72 hours. ?Hemoglobin A1C ?No results for input(s): HGBA1C in the last 72 hours. ? ?Fasting Lipid Panel ?No results for input(s): CHOL, HDL, LDLCALC, TRIG, CHOLHDL, LDLDIRECT in the last 72 hours. ? ?Thyroid Function Tests ?No results for input(s): TSH, T4TOTAL, T3FREE, THYROIDAB in the last 72 hours. ? ?Invalid input(s): FREET3 ? ? ?Other results: ? ? ?Imaging  ? ? ?DG CHEST PORT 1 VIEW ? ?Result Date: 02/08/2022 ?CLINICAL DATA:  CABG with shortness of breath. EXAM: PORTABLE CHEST 1 VIEW COMPARISON:  Portable chest yesterday at 6:10 a.m. FINDINGS: 5:16 a.m., 02/08/2022. Interval removal ETT, NGT, mediastinal drains and bilateral chest tubes. Right IJ Swan-Ganz catheter tip remains in the pulmonary outflow tract. Additionally the IABP radiopaque marker is no longer seen. There is no visible pneumothorax. There are increasing  small or small to moderate-sized layering bilateral pleural effusions and increasing opacity in the lower lung fields which could be atelectasis, consolidation or combination. There is mild cardiomegaly with stable CABG changes and mitral valve prosthesis. Stable mediastinum with aortic atherosclerosis. There is mild cardiomegaly with normal caliber central vessels. The upper lung fields are clear. Osteopenia and thoracic spondylosis. IMPRESSION: 1. Increasing pleural effusions and lower zonal lung opacities which could be due to atelectasis or consolidation. 2. Interval removal chest tubes, mediastinal drains, NGT/ETT, and apparently also the IABP. 3. No visible pneumothorax.  Stable mediastinal configuration. Electronically Signed   By: Telford Nab M.D.   On: 02/08/2022 06:49   ? ? ?Medications:   ? ? ?Scheduled Medications: ? acetaminophen  1,000 mg Oral Q6H  ? aspirin EC  325 mg Oral Daily  ? bisacodyl  10 mg Oral Daily  ? Or  ? bisacodyl  10 mg Rectal Daily  ? Chlorhexidine Gluconate Cloth  6 each Topical Daily  ? docusate sodium  200 mg Oral Daily  ? enoxaparin (LOVENOX) injection  40 mg Subcutaneous QHS  ? furosemide  40 mg Intravenous BID  ? insulin aspart  0-15 Units Subcutaneous TID WC  ? insulin detemir  10 Units Subcutaneous Daily  ? levothyroxine  25 mcg Oral Q0600  ? lidocaine  2 patch Transdermal Q24H  ? mouth rinse  15 mL Mouth Rinse BID  ? mupirocin ointment  1 application. Nasal BID  ? pantoprazole  40 mg Oral Daily  ? potassium chloride  20 mEq Oral BID  ? sodium chloride flush  10-40 mL Intracatheter Q12H  ? sodium chloride flush  3 mL Intravenous Q12H  ? ? ?Infusions: ? sodium chloride Stopped (02/08/22 0826)  ? sodium chloride    ? sodium chloride 20 mL/hr at 02/06/22 1721  ? famotidine (PEPCID) IV Stopped (02/07/22 1703)  ? lactated ringers    ? lactated ringers 20 mL/hr at 02/08/22 1000  ? milrinone 0.125 mcg/kg/min (02/08/22 1000)  ? norepinephrine (LEVOPHED) Adult infusion 7 mcg/min  (02/08/22 1000)  ? ? ?PRN Medications: ?sodium chloride, morphine injection, ondansetron (ZOFRAN) IV, oxyCODONE, sodium chloride flush, sodium chloride flush, traMADol ? ? ? ?Patient Profile  ? ?76 y/o with severe CAD/NSTEMI and iCM EF 30-35%. Developed refractory angina and shock yesterday with respiratory failure. Intubated and IABP placed. Taken to OR 4/7 for CABG + MVR  ? ?Assessment/Plan  ? ?1. NSTEMI---> Cardiogenic Shock ?-  HS Trop 329>5188> 6513 ?- Cath with 3V disease, elevated wedge, and reduced cardiac output. IABP placed in lab with Norepi/Milrinone.  ?- 4/7 s/p CABG + MVR ?- Doing well No s/s angina ?- On ASA. Add statin soon ?- With NSTEMI will need Plavix prior to d/c ?  ?2. Acute Hypoxemic Respiratory Failure ?- Extubated 4/8. ?- stable ? ?3. Acute HFrEF, ICM  ?- Echo EF 30-35% RV ok. Severe MR. EF improved on post CABG TEE ?- Now s/p CABG ?- on NE 7 + Milrinone 0.125. IABP out ?- Coox looks good. Wean NE as toelrated ?- Add GDMT as tolerated.  ?- Weight up. Needs diuresis . Start lasix gtt at 6 ?- Will repeat limited echo prior to d/c ?  ?4. Severe MR  ?- S/p MV replacement ?- Warfarin started per TCTS (will be on for 3 months) ?  ?5. Hypothyroidism  ?-On levothyroxine.  ?  ?6. AKI due to ATN/shock ?- resolved ? ?7. Acute blood loss anemia ?- transfuse as needed to keep hgb >= 8.0 ? ?8. Fever ?- suspect atx. Most lines out ?- Encourage IS ? ?CRITICAL CARE ?Performed by: Glori Bickers ? ?Total critical care time: 35 minutes ? ?Critical care time was exclusive of separately billable procedures and treating other patients. ? ?Critical care was necessary to treat or prevent imminent or life-threatening deterioration. ? ?Critical care was time spent personally by me (independent of midlevel providers or residents) on the following activities: development of treatment plan with patient and/or surrogate as well as nursing, discussions with consultants, evaluation of patient's response to treatment,  examination of patient, obtaining history from patient or surrogate, ordering and performing treatments and interventions, ordering and review of laboratory studies, ordering and review of radiographic stud

## 2022-02-08 NOTE — Progress Notes (Signed)
2 Days Post-Op Procedure(s) (LRB): ?CORONARY ARTERY BYPASS GRAFTING X4, Using Left and Right Greater Saphenous Vein Harvested Endoscopically (N/A) ?MITRAL VALVE (MV) REPLACEMENT Using 25MM Mitris Valve (N/A) ?TRANSESOPHAGEAL ECHOCARDIOGRAM (TEE) (N/A) ?Subjective: ? ?No specific complaints ? ?Objective: ?Vital signs in last 24 hours: ?Temp:  [97.9 ?F (36.6 ?C)-101.1 ?F (38.4 ?C)] 99.7 ?F (37.6 ?C) (04/09 0600) ?Pulse Rate:  [45-109] 95 (04/09 0600) ?Cardiac Rhythm: Normal sinus rhythm (04/08 2000) ?Resp:  [16-26] 17 (04/09 0600) ?BP: (95-140)/(45-74) 118/56 (04/09 0400) ?SpO2:  [91 %-100 %] 93 % (04/09 0600) ?Arterial Line BP: (83-200)/(39-78) 140/52 (04/09 0600) ?FiO2 (%):  [40 %] 40 % (04/08 0832) ?Weight:  [66.8 kg] 66.8 kg (04/09 0500) ? ?Hemodynamic parameters for last 24 hours: ?PAP: (30-43)/(15-26) 41/20 ?CVP:  [11 mmHg-21 mmHg] 15 mmHg ? ?Intake/Output from previous day: ?04/08 0701 - 04/09 0700 ?In: 2194.1 [I.V.:1844.2; IV Piggyback:349.8] ?Out: 1990 [Urine:1760; Chest Tube:230] ?Intake/Output this shift: ?No intake/output data recorded. ? ?General appearance: alert and cooperative ?Neurologic: intact ?Heart: regular rate and rhythm, S1, S2 normal, no murmur ?Lungs: diminished breath sounds bibasilar ?Abdomen: soft, non-tender; bowel sounds normal ?Extremities: edema moderate ?Wound: dressings dry ? ?Lab Results: ?Recent Labs  ?  02/07/22 ?1648 02/08/22 ?0315  ?WBC 22.9* 23.5*  ?HGB 9.1* 8.8*  ?HCT 27.8* 27.8*  ?PLT 177 215  ? ?BMET:  ?Recent Labs  ?  02/07/22 ?1648 02/08/22 ?0315  ?NA 138 138  ?K 4.0 4.1  ?CL 109 107  ?CO2 21* 24  ?GLUCOSE 177* 143*  ?BUN 19 18  ?CREATININE 0.94 0.93  ?CALCIUM 7.5* 7.7*  ?  ?PT/INR:  ?Recent Labs  ?  02/06/22 ?1706  ?LABPROT 16.1*  ?INR 1.3*  ? ?ABG ?   ?Component Value Date/Time  ? PHART 7.389 02/07/2022 1010  ? HCO3 22.0 02/07/2022 1010  ? TCO2 23 02/07/2022 1010  ? ACIDBASEDEF 3.0 (H) 02/07/2022 1010  ? O2SAT 68 02/08/2022 0315  ? ?CBG (last 3)  ?Recent Labs  ?   02/07/22 ?1641 02/07/22 ?1931 02/07/22 ?2332  ?GLUCAP 140* 142* 140*  ? ?CXR: bibasilar atelectasis. Vascular congestion. ? ?Assessment/Plan: ?S/P Procedure(s) (LRB): ?CORONARY ARTERY BYPASS GRAFTING X4, Using Left and Right Greater Saphenous Vein Harvested Endoscopically (N/A) ?MITRAL VALVE (MV) REPLACEMENT Using 25MM Mitris Valve (N/A) ?TRANSESOPHAGEAL ECHOCARDIOGRAM (TEE) (N/A) ? ?POD 2 ?Hemodynamically stable with CI 3.4 and Co-ox 68 on milrinone 0.25 and NE 7. ?Decrease milrinone to 0.125 and can probably stop later. Wean NE for MAP>70. DC swan  ? ?Volume excess with some vascular congestion on CXR, wt 19 lbs over preop if accurate and CVP 13 with normal PA pressures. Continue lasix today. ? ?Glucose under good control. Decrease Levemir to 10 and continue SSI. ? ?Fever last pm to 101 and leukocytosis. Old line all out. Leukocytosis may be inflammatory.  ? ?IS, OOB. ? ?Start Coumadin tonight for MV and would keep on Coumadin for 3 months, then ASA. ? ? LOS: 5 days  ? ? ?Tonya Hamilton ?02/08/2022 ? ? ?

## 2022-02-08 NOTE — Progress Notes (Signed)
? ?NAME:  Tonya Hamilton, MRN:  [id number], DOB:  10-09-1946, LOS: 5 ?ADMISSION DATE:  02/02/2022, CONSULTATION DATE:  02/03/22 ?REFERRING MD:  Christy Gentles CHIEF COMPLAINT:  Dyspnea  ? ?History of Present Illness:  ?Tonya Hamilton is a 76 y.o. female who has a PMH as outlined below.  She presented to [facility] ED 4/3 with dyspnea.  She states that symptoms began suddenly and progressed.  She also endorsed cough intimately productive of sputum.  No fevers/chills/sweats, chest pain, N/V/D, abdominal pain, myalgias, exposures known sick contacts, recent travel. ? ?In ED, she was found to be hypoxic.  CXR showed bilateral opacities and bilateral mild edema.  Troponin was elevated at 708 with repeat up to 1454.  BNP mildly elevated at 366.  EKG with ST depressions and global ischemia.  Leukocytosis to 23.6.  She was initially placed on Ventimask but was later upgraded to BiPAP.  She continued to require BiPAP which she did feel subjective improvement in symptoms.  Patient stable subsequently called for transfer to ICU at [facility] for further evaluation management. ? ?Pertinent  Medical History:  ?has History of colonic diverticulitis; Osteoarthritis; Hypertension; Hyperlipidemia; Hypothyroid; GERD (gastroesophageal reflux disease); Osteopenia; Anxiety; Osteoarthritis of right hip; Angioedema; Chronic intractable headache; Occipital neuralgia of left side; Neck pain; Hyperreflexia; Arthritis, multiple joint involvement; Spasmodic dysphonia; Acute respiratory failure ([place]); Respiratory failure ([place]); Community acquired pneumonia; Non-STEMI (non-ST elevated myocardial infarction) ([place]); Cardiogenic shock ([place]); Severe mitral regurgitation; Ischemic cardiomyopathy; Multiple vessel coronary artery disease; and S/P CABG x 4 on their problem list. ? ?Significant Hospital Events: ?Including procedures, antibiotic start and stop dates in addition to other pertinent events   ?4/4 admit, started on Cefepime and Azith and Bipap.   Trop rising, on heparin gtt,  Echo with reduced EF and regional wall motion abnormalities with severe MR, cardiology consulted and taken to cath lab which revealed severe LM and 3V disease.  IABP placed and intubated, in cardiogenic shock ?4/6 Lying in bed on IABP with progressive stability seen, plan for CABG with MVR tomorrow  ?4/7 OR to CABG and MVR, no issues overnight  ?4/8 Extubated and IABP removed.  Chest tubes removed ?4/9 up to chair. ? ? ?Interim History / Subjective:  ?Still feels tired.  Chest pain well controlled.  Was able to tolerate getting up to chair ? ?Objective:  ?Blood pressure (!) 107/49, pulse 97, temperature 97.9 ?F (36.6 ?C), temperature source Oral, resp. rate 15, height '5\' 4"'$  (1.626 m), weight 66.8 kg, SpO2 91 %. ?PAP: (30-43)/(15-26) 35/18 ?CVP:  [11 mmHg-21 mmHg] 14 mmHg  ?   ? ?Intake/Output Summary (Last 24 hours) at 02/08/2022 1641 ?Last data filed at 02/08/2022 1600 ?Gross per 24 hour  ?Intake 2013.02 ml  ?Output 1790 ml  ?Net 223.02 ml  ? ? ?Filed Weights  ? 02/06/22 0500 02/07/22 0400 02/08/22 0500  ?Weight: 58.5 kg 63.7 kg 66.8 kg  ? ?Physical Exam  ?General: Acute on chronically ill appearing elderly female lying in bed ?HEENT: Moist mucous membranes.  No pressure injury. ?Neuro: Awake but tired.  Fully conversant.  No focal deficits ?CV: Currently in sinus rhythm extremities warm well perfused.  Mild peripheral edema.  Heart sounds unremarkable 2/6 systolic murmur. ?PULM:  Clear to ascultation, no increased work of breathing, no added breath sounds  ?GI: soft, bowel sounds active in all 4 quadrants, non-tender, non-distended, tolerating TF ?Extremities: warm/dry, no edema  ?Skin: no rashes or lesions ? ?Assessment & Plan:  ? ?Critically ill due to acute  mixed cardiogenic and distributive shock following cardiac surgery requiring titration of norepinephrine, milrinone and intra-aortic balloon counterpulsation support. ?Coronary artery disease status post bypass. ?Severe ischemic MR  status post MVR. ?Acute hypoxic respiratory failure secondary to acute cardiogenic pulmonary edema. ?Hypothyroidism ?Hypertension ?Dyslipidemia ?Chronic UTIs by history. ? ?Plan: ? ?-Repeat Co. ox this afternoon.  If normal can stop milrinone. ?-Furosemide infusion.  Still volume overloaded.  Oxygenation is marginal and chest x-ray shows bilateral pleural effusions ?-Wean norepinephrine to keep MAP greater than 65. ?-Continue multimodal pain control. ?-Commence phase 1 cardiac rehabilitation ?-Secondary prevention: Start statin.  On ASA.  Has a history of myalgias with simvastatin.  We will try rosuvastatin instead starting at low-dose. ? ?Best practice (evaluated daily):  ?Diet/type: Regular consistency (see orders) ?DVT prophylaxis: Prophylactic Lovenox. ?GI prophylaxis: H2B ?Lines: Right IJ introducer, arterial line. ?Foley:  Yes, and it is still needed ?Code Status:  full code ?Last date of multidisciplinary goals of care discussion:Family updated at bedside  ? ? ? ?Kipp Brood, MD FRCPC ?ICU Physician ?[place]  ?Pager: [phone] ?Mobile: [phone] ?After hours: [phone]. ? ? ?02/08/2022, 4:41 PM ? ? ? ? ?

## 2022-02-09 ENCOUNTER — Other Ambulatory Visit (HOSPITAL_COMMUNITY): Payer: Self-pay

## 2022-02-09 ENCOUNTER — Inpatient Hospital Stay (HOSPITAL_COMMUNITY): Payer: Medicare HMO

## 2022-02-09 DIAGNOSIS — J9601 Acute respiratory failure with hypoxia: Secondary | ICD-10-CM | POA: Diagnosis not present

## 2022-02-09 DIAGNOSIS — J810 Acute pulmonary edema: Secondary | ICD-10-CM | POA: Diagnosis not present

## 2022-02-09 DIAGNOSIS — J9602 Acute respiratory failure with hypercapnia: Secondary | ICD-10-CM | POA: Diagnosis not present

## 2022-02-09 DIAGNOSIS — R570 Cardiogenic shock: Secondary | ICD-10-CM | POA: Diagnosis not present

## 2022-02-09 DIAGNOSIS — J90 Pleural effusion, not elsewhere classified: Secondary | ICD-10-CM

## 2022-02-09 DIAGNOSIS — I2510 Atherosclerotic heart disease of native coronary artery without angina pectoris: Secondary | ICD-10-CM | POA: Diagnosis not present

## 2022-02-09 LAB — BASIC METABOLIC PANEL
Anion gap: 8 (ref 5–15)
BUN: 15 mg/dL (ref 8–23)
CO2: 28 mmol/L (ref 22–32)
Calcium: 8.1 mg/dL — ABNORMAL LOW (ref 8.9–10.3)
Chloride: 101 mmol/L (ref 98–111)
Creatinine, Ser: 0.84 mg/dL (ref 0.44–1.00)
GFR, Estimated: >60 mL/min (ref >60)
Glucose, Bld: 169 mg/dL — ABNORMAL HIGH (ref 70–99)
Potassium: 4.4 mmol/L (ref 3.5–5.1)
Sodium: 137 mmol/L (ref 135–145)

## 2022-02-09 LAB — TYPE AND SCREEN
ABO/RH(D): O NEG
Antibody Screen: NEGATIVE
Unit division: 0
Unit division: 0
Unit division: 0
Unit division: 0

## 2022-02-09 LAB — BPAM RBC
Blood Product Expiration Date: 202304272359
Blood Product Expiration Date: 202304272359
Blood Product Expiration Date: 202304282359
Blood Product Expiration Date: 202305062359
ISSUE DATE / TIME: 202304070936
ISSUE DATE / TIME: 202304070936
ISSUE DATE / TIME: 202304071434
ISSUE DATE / TIME: 202304071434
Unit Type and Rh: 5100
Unit Type and Rh: 5100
Unit Type and Rh: 5100
Unit Type and Rh: 5100

## 2022-02-09 LAB — CBC
HCT: 29.7 % — ABNORMAL LOW (ref 36.0–46.0)
Hemoglobin: 9.3 g/dL — ABNORMAL LOW (ref 12.0–15.0)
MCH: 28.6 pg (ref 26.0–34.0)
MCHC: 31.3 g/dL (ref 30.0–36.0)
MCV: 91.4 fL (ref 80.0–100.0)
Platelets: 282 10*3/uL (ref 150–400)
RBC: 3.25 MIL/uL — ABNORMAL LOW (ref 3.87–5.11)
RDW: 14.9 % (ref 11.5–15.5)
WBC: 23.3 10*3/uL — ABNORMAL HIGH (ref 4.0–10.5)
nRBC: 0 % (ref 0.0–0.2)

## 2022-02-09 LAB — COOXEMETRY PANEL
Carboxyhemoglobin: 1.7 % — ABNORMAL HIGH (ref 0.5–1.5)
Carboxyhemoglobin: 1.9 % — ABNORMAL HIGH (ref 0.5–1.5)
Methemoglobin: 0.8 % (ref 0.0–1.5)
Methemoglobin: <0.7 % (ref 0.0–1.5)
O2 Saturation: 80.3 %
O2 Saturation: 74.9 %
Total hemoglobin: 9.8 g/dL — ABNORMAL LOW (ref 12.0–16.0)
Total hemoglobin: 9.6 g/dL — ABNORMAL LOW (ref 12.0–16.0)

## 2022-02-09 LAB — POCT I-STAT 7, (LYTES, BLD GAS, ICA,H+H)
Acid-Base Excess: 2 mmol/L (ref 0.0–2.0)
Bicarbonate: 29.4 mmol/L — ABNORMAL HIGH (ref 20.0–28.0)
Calcium, Ion: 1.17 mmol/L (ref 1.15–1.40)
HCT: 29 % — ABNORMAL LOW (ref 36.0–46.0)
Hemoglobin: 9.9 g/dL — ABNORMAL LOW (ref 12.0–15.0)
O2 Saturation: 94 %
Potassium: 4.7 mmol/L (ref 3.5–5.1)
Sodium: 136 mmol/L (ref 135–145)
TCO2: 31 mmol/L (ref 22–32)
pCO2 arterial: 62.4 mmHg — ABNORMAL HIGH (ref 32–48)
pH, Arterial: 7.282 — ABNORMAL LOW (ref 7.35–7.45)
pO2, Arterial: 84 mmHg (ref 83–108)

## 2022-02-09 LAB — SURGICAL PATHOLOGY

## 2022-02-09 LAB — GLUCOSE, CAPILLARY
Glucose-Capillary: 184 mg/dL — ABNORMAL HIGH (ref 70–99)
Glucose-Capillary: 125 mg/dL — ABNORMAL HIGH (ref 70–99)
Glucose-Capillary: 114 mg/dL — ABNORMAL HIGH (ref 70–99)

## 2022-02-09 MED ORDER — WARFARIN - PHYSICIAN DOSING INPATIENT
Freq: Every day | Status: DC
Start: 2022-02-09 — End: 2022-02-19

## 2022-02-09 MED ORDER — WARFARIN SODIUM 2.5 MG PO TABS
2.5000 mg | ORAL_TABLET | Freq: Once | ORAL | Status: AC
  Administered 2022-02-09: 2.5 mg via ORAL
  Filled 2022-02-09: qty 1

## 2022-02-09 MED ORDER — SIMETHICONE 40 MG/0.6ML PO SUSP
40.0000 mg | Freq: Four times a day (QID) | ORAL | Status: DC | PRN
  Filled 2022-02-09: qty 0.6

## 2022-02-09 MED ORDER — GLUCERNA SHAKE PO LIQD
237.0000 mL | Freq: Two times a day (BID) | ORAL | Status: DC
  Administered 2022-02-09 – 2022-02-11 (×3): 237 mL via ORAL

## 2022-02-09 MED ORDER — ENSURE MAX PROTEIN PO LIQD
11.0000 [oz_av] | Freq: Every day | ORAL | Status: DC
  Administered 2022-02-09: 237 mL via ORAL
  Administered 2022-02-11: 11 [oz_av] via ORAL
  Administered 2022-02-12: 237 mL via ORAL
  Administered 2022-02-13 – 2022-02-15 (×3): 11 [oz_av] via ORAL
  Filled 2022-02-09 (×8): qty 330

## 2022-02-09 MED ORDER — ADULT MULTIVITAMIN W/MINERALS CH
1.0000 | ORAL_TABLET | Freq: Every day | ORAL | Status: DC
  Administered 2022-02-09 – 2022-02-19 (×11): 1 via ORAL
  Filled 2022-02-09 (×11): qty 1

## 2022-02-09 MED ORDER — METOLAZONE 2.5 MG PO TABS
2.5000 mg | ORAL_TABLET | Freq: Once | ORAL | Status: AC
  Administered 2022-02-09: 2.5 mg via ORAL
  Filled 2022-02-09: qty 1

## 2022-02-09 MED ORDER — METOCLOPRAMIDE HCL 5 MG/ML IJ SOLN
5.0000 mg | Freq: Four times a day (QID) | INTRAMUSCULAR | Status: AC
  Administered 2022-02-09 – 2022-02-10 (×3): 5 mg via INTRAVENOUS
  Filled 2022-02-09 (×3): qty 2

## 2022-02-09 MED FILL — Thrombin (Recombinant) For Soln 20000 Unit: CUTANEOUS | Qty: 1 | Status: AC

## 2022-02-09 NOTE — Progress Notes (Signed)
Patient ID: Tonya Hamilton, female   DOB: 1946-01-30, 76 y.o.   MRN: [id number] ?TCTS Evening Rounds: ? ?Hemodynamically stable on NE 4.  Co-ox 74% this afternoon. ? ?PCO2 62.4 this afternoon which is likely why she has been sleepy. She seems more alert now up in chair. Plan to use Bipap at night.  ? ?Lasix drip up to 12 and 2.5 metolazone today. Diuresing well now. ?

## 2022-02-09 NOTE — TOC Benefit Eligibility Note (Signed)
Patient Advocate Encounter ? ?Insurance verification completed.   ? ?The patient is currently admitted and upon discharge could be taking Farxiga 10 mg. ? ?The current 30 day co-pay is, $47.00.  ? ?The patient is currently admitted and upon discharge could be taking Jardiance 10 mg. ? ?The current 30 day co-pay is, $47.00.  ? ?The patient is insured through [employer] Part D  ? ? ? ?Lyndel Safe, CPhT ?Pharmacy Patient Advocate Specialist ?[facility] Patient Advocate Team ?Direct Number: [phone]  Fax: [phone] ? ? ? ? ? ?  ?

## 2022-02-09 NOTE — Progress Notes (Signed)
3 Days Post-Op Procedure(s) (LRB): ?CORONARY ARTERY BYPASS GRAFTING X4, Using Left and Right Greater Saphenous Vein Harvested Endoscopically (N/A) ?MITRAL VALVE (MV) REPLACEMENT Using 25MM Mitris Valve (N/A) ?TRANSESOPHAGEAL ECHOCARDIOGRAM (TEE) (N/A) ?Subjective: ? ?Has been hemodynamically stable overnight in sinus rhythm off milrinone. Still on NE 8.  ? ?Lasix drip has been at 6 but UO only 30-40/hr despite normal creat. I=O, wt minimally  changed. ? ?Got OOB to chair some yesterday. ? ?Objective: ?Vital signs in last 24 hours: ?Temp:  [97.5 ?F (36.4 ?C)-99.1 ?F (37.3 ?C)] 98.1 ?F (36.7 ?C) (04/10 0300) ?Pulse Rate:  [90-101] 100 (04/10 0600) ?Cardiac Rhythm: Normal sinus rhythm (04/09 2000) ?Resp:  [9-23] 11 (04/10 0600) ?BP: (100-134)/(40-60) 121/49 (04/10 0600) ?SpO2:  [90 %-97 %] 95 % (04/10 0600) ?Arterial Line BP: (70-145)/(47-97) 101/97 (04/10 0400) ?Weight:  [66.7 kg] 66.7 kg (04/10 0500) ? ?Hemodynamic parameters for last 24 hours: ?PAP: (35)/(18) 35/18 ?CVP:  [place] mmHg] 14 mmHg ? ?Intake/Output from previous day: ?04/09 0701 - 04/10 0700 ?In: 1205.4 [I.V.:1205.4] ?Out: 1085 [Urine:1085] ?Intake/Output this shift: ?Total I/O ?In: 620.7 [I.V.:620.7] ?Out: 435 [Urine:435] ? ?General appearance: groggy but appropriate ?Neurologic: intact ?Heart: regular rate and rhythm, S1, S2 normal, no murmur ?Lungs: diminished breath sounds bibasilar with some rales ?Abdomen: soft, non-tender; bowel sounds normal ?Extremities: edema moderate ?Wound: dressings dry ? ?Lab Results: ?Recent Labs  ?  02/08/22 ?0315 02/09/22 ?0345  ?WBC 23.5* 23.3*  ?HGB 8.8* 9.3*  ?HCT 27.8* 29.7*  ?PLT 215 282  ? ?BMET:  ?Recent Labs  ?  02/08/22 ?0315 02/09/22 ?0345  ?NA 138 137  ?K 4.1 4.4  ?CL 107 101  ?CO2 24 28  ?GLUCOSE 143* 169*  ?BUN 18 15  ?CREATININE 0.93 0.84  ?CALCIUM 7.7* 8.1*  ?  ?PT/INR:  ?Recent Labs  ?  02/06/22 ?1706  ?LABPROT 16.1*  ?INR 1.3*  ? ?ABG ?   ?Component Value Date/Time  ? PHART 7.389 02/07/2022 1010  ? HCO3 22.0  02/07/2022 1010  ? TCO2 23 02/07/2022 1010  ? ACIDBASEDEF 3.0 (H) 02/07/2022 1010  ? O2SAT 80.3 02/09/2022 0345  ? ?CBG (last 3)  ?Recent Labs  ?  02/08/22 ?1147 02/08/22 ?1228 02/08/22 ?1558  ?GLUCAP 151* 159* 148*  ? ?CXR: bilateral pleural effusions and bibasilar atelectasis ? ?Assessment/Plan: ?S/P Procedure(s) (LRB): ?CORONARY ARTERY BYPASS GRAFTING X4, Using Left and Right Greater Saphenous Vein Harvested Endoscopically (N/A) ?MITRAL VALVE (MV) REPLACEMENT Using 25MM Mitris Valve (N/A) ?TRANSESOPHAGEAL ECHOCARDIOGRAM (TEE) (N/A) ? ?POD 3 ?Hemodynamics stable but still requiring some NE for BP support. Could start midodrine. Co-ox 80% if accurate. ? ?Volume excess: needs diuresis. Increase lasix and consider metolazone. Will let DB decide about that for consistency. ? ?IS, OOB.  ? ?Will need Coumadin for 3 months ideally but no hurry to start that. May need thoracentesis at some point so will hold off until she is progressing. ? ?Leukocytosis but no further fever. Suspect this is inflammatory. ? ? LOS: 6 days  ? ? ?Tonya Hamilton ?02/09/2022 ? ? ?

## 2022-02-09 NOTE — Progress Notes (Addendum)
?  ? ? Advanced Heart Failure Rounding Note ? ?PCP-Cardiologist: None  ? ?Subjective:   ? ?76 y/o with severe CAD/NSTEMI and iCM EF 30-35%. Developed refractory angina and shock with respiratory failure. Intubated and IABP placed.  ?4/7 CABG + MVR  ?4/8 Extubated, IABP removed  ? ?POD#3 ? ?Remains on NE 7. Off milrinone. Co-ox 80%. Repeat pending  ? ?On lasix gtt at 6/hr. Only 1.1 L in UOP yesterday. Scr stable, 0.93>>0.84. remains fluid overloaded, up 19 lb from pre-op wt.  ? ?K 4.4 ? ?Sinus tach, low 100s.  ? ?Moderate b/l pleural effusions on CXR today.  ? ?Groggy today but will awake and follow commands. Got lots of pain meds overnight. Ultram, morphine + oxycodone.  ? ? ?Objective:   ?Weight Range: ?66.7 kg ?Body mass index is 25.24 kg/m?.  ? ?Vital Signs:   ?Temp:  [97.5 ?F (36.4 ?C)-98.2 ?F (36.8 ?C)] 97.5 ?F (36.4 ?C) (04/10 0739) ?Pulse Rate:  [90-102] 102 (04/10 0700) ?Resp:  [9-23] 10 (04/10 0700) ?BP: (100-134)/(40-60) 121/56 (04/10 0700) ?SpO2:  [90 %-97 %] 95 % (04/10 0700) ?Arterial Line BP: (70-145)/(47-97) 101/97 (04/10 0400) ?Weight:  [66.7 kg] 66.7 kg (04/10 0500) ?Last BM Date : 02/03/22 ? ?Weight change: ?Filed Weights  ? 02/07/22 0400 02/08/22 0500 02/09/22 0500  ?Weight: 63.7 kg 66.8 kg 66.7 kg  ? ? ?Intake/Output:  ? ?Intake/Output Summary (Last 24 hours) at 02/09/2022 0810 ?Last data filed at 02/09/2022 0700 ?Gross per 24 hour  ?Intake 1188.98 ml  ?Output 1125 ml  ?Net 63.98 ml  ?  ? ? ?Physical Exam  ? ?General:  groggy appearing but will awake and follow commands. No respiratory difficulty ?HEENT: normal ?Neck: supple. JVD elevated to jaw. + Rt IJ CVC Carotids 2+ bilat; no bruits. No lymphadenopathy or thyromegaly appreciated. ?Cor: PMI nondisplaced. Regular rhythm and mildly tachy rate.  No rubs, gallops or murmurs. Dry/clean sternal dressing  ?Lungs: decreased BS b/l at the bases  ?Abdomen: soft, nontender, nondistended. No hepatosplenomegaly. No bruits or masses. Good bowel  sounds. ?Extremities: no cyanosis, clubbing, rash, 1+ b/l L edema ?Neuro: lethargic but will awake and follow commands, cranial nerves grossly intact. moves all 4 extremities w/o difficulty. Affect pleasant. ?GU: + Foley  ? ? ?Telemetry  ? ?Sinus tach, low 100s Personally reviewed ? ?Labs  ?  ?CBC ?Recent Labs  ?  02/08/22 ?0315 02/09/22 ?0345  ?WBC 23.5* 23.3*  ?HGB 8.8* 9.3*  ?HCT 27.8* 29.7*  ?MCV 89.7 91.4  ?PLT 215 282  ? ?Basic Metabolic Panel ?Recent Labs  ?  02/07/22 ?0422 02/07/22 ?0607 02/07/22 ?1648 02/08/22 ?0315 02/09/22 ?0345  ?NA 140   < > 138 138 137  ?K 4.2   < > 4.0 4.1 4.4  ?CL 110  --  109 107 101  ?CO2 23  --  21* 24 28  ?GLUCOSE 136*  --  177* 143* 169*  ?BUN 22  --  '19 18 15  '$ ?CREATININE 0.76  --  0.94 0.93 0.84  ?CALCIUM 7.4*  --  7.5* 7.7* 8.1*  ?MG 3.2*  --  2.6*  --   --   ? < > = values in this interval not displayed.  ? ?Liver Function Tests ?No results for input(s): AST, ALT, ALKPHOS, BILITOT, PROT, ALBUMIN in the last 72 hours. ?No results for input(s): LIPASE, AMYLASE in the last 72 hours. ?Cardiac Enzymes ?No results for input(s): CKTOTAL, CKMB, CKMBINDEX, TROPONINI in the last 72 hours. ? ?BNP: ?BNP (last 3  results) ?Recent Labs  ?  02/02/22 ?2321  ?BNP 366.4*  ? ? ?ProBNP (last 3 results) ?No results for input(s): PROBNP in the last 8760 hours. ? ? ?D-Dimer ?No results for input(s): DDIMER in the last 72 hours. ?Hemoglobin A1C ?No results for input(s): HGBA1C in the last 72 hours. ? ?Fasting Lipid Panel ?No results for input(s): CHOL, HDL, LDLCALC, TRIG, CHOLHDL, LDLDIRECT in the last 72 hours. ? ?Thyroid Function Tests ?No results for input(s): TSH, T4TOTAL, T3FREE, THYROIDAB in the last 72 hours. ? ?Invalid input(s): FREET3 ? ? ?Other results: ? ? ?Imaging  ? ? ?DG Chest Port 1 View ? ?Result Date: 02/09/2022 ?CLINICAL DATA:  76 year old female with history of CABG and mitral valve replacement on 02/06/2022. EXAM: PORTABLE CHEST 1 VIEW COMPARISON:  Chest x-ray 02/08/2022.  FINDINGS: Previously noted Swan-Ganz catheter has been removed. Right IJ Cordis remains in position with tip in the proximal superior vena cava. Lung volumes are low. Bibasilar opacities are again noted, compatible with moderate bilateral pleural effusions with areas of passive subsegmental atelectasis in the lung bases bilaterally. No pneumothorax. No evidence of pulmonary edema. Heart size is mildly enlarged. Upper mediastinal contours are within normal limits. Status post median sternotomy for CABG and mitral valve replacement with what appears to be a stented bioprosthesis. Atherosclerotic calcifications in the thoracic aorta. IMPRESSION: 1. Postoperative changes and support apparatus, as above. 2. Low lung volumes with moderate bilateral pleural effusions and passive postoperative subsegmental atelectasis throughout the lung bases bilaterally. 3. Aortic atherosclerosis. Electronically Signed   By: Vinnie Langton M.D.   On: 02/09/2022 06:37   ? ? ?Medications:   ? ? ?Scheduled Medications: ? acetaminophen  1,000 mg Oral Q6H  ? aspirin EC  325 mg Oral Daily  ? bisacodyl  10 mg Oral Daily  ? Or  ? bisacodyl  10 mg Rectal Daily  ? Chlorhexidine Gluconate Cloth  6 each Topical Daily  ? docusate sodium  200 mg Oral Daily  ? enoxaparin (LOVENOX) injection  40 mg Subcutaneous QHS  ? insulin aspart  0-15 Units Subcutaneous TID WC  ? insulin detemir  10 Units Subcutaneous Daily  ? levothyroxine  25 mcg Oral Q0600  ? lidocaine  2 patch Transdermal Q24H  ? mouth rinse  15 mL Mouth Rinse BID  ? metoCLOPramide (REGLAN) injection  10 mg Intravenous Q6H  ? mupirocin ointment  1 application. Nasal BID  ? pantoprazole  40 mg Oral Daily  ? rosuvastatin  5 mg Oral Daily  ? sodium chloride flush  10-40 mL Intracatheter Q12H  ? sodium chloride flush  3 mL Intravenous Q12H  ? ? ?Infusions: ? sodium chloride Stopped (02/08/22 0826)  ? sodium chloride    ? sodium chloride 20 mL/hr at 02/06/22 1721  ? famotidine (PEPCID) IV Stopped  (02/07/22 1703)  ? furosemide (LASIX) 200 mg in dextrose 5% 100 mL ('2mg'$ /mL) infusion 6 mg/hr (02/09/22 0700)  ? lactated ringers    ? lactated ringers 20 mL/hr at 02/09/22 0700  ? norepinephrine (LEVOPHED) Adult infusion 7 mcg/min (02/09/22 0700)  ? ? ?PRN Medications: ?sodium chloride, morphine injection, ondansetron (ZOFRAN) IV, oxyCODONE, simethicone, sodium chloride flush, sodium chloride flush, traMADol ? ? ? ?Patient Profile  ? ?76 y/o with severe CAD/NSTEMI and iCM EF 30-35%. Developed refractory angina and shock yesterday with respiratory failure. Intubated and IABP placed. Taken to OR 4/7 for CABG + MVR  ? ?Assessment/Plan  ? ?1. NSTEMI---> Cardiogenic Shock ?- HS Trop 858>8502> 6513 ?- Cath with  3V disease, elevated wedge, and reduced cardiac output. IABP placed in lab with Norepi/Milrinone.  ?- 4/7 s/p CABG + MVR ?- Doing well No s/s angina ?- On ASA 325 ?- on statin  ?- With NSTEMI will need Plavix prior to d/c ?  ?2. Acute Hypoxemic Respiratory Failure ?- Extubated 4/8. ?- stable ? ?3. Acute HFrEF, ICM  ?- Echo EF 30-35% RV ok. Severe MR. EF improved on post CABG TEE ?- Now s/p CABG ?- on NE 7. Off Milrinone. IABP out ?- Co-ok 80%. ? If accurate. Lethargic today, ? Due to pain meds. Repeat co-ox to ensure not low output ?- BP too soft for GDMT currently   ?- Weight up. Needs diuresis . Increase lasix gtt to 12/hr  ?- Will repeat limited echo prior to d/c ?  ?4. Severe MR  ?- S/p MV replacement ?- Warfarin started per TCTS (will be on for 3 months) ?  ?5. Hypothyroidism  ?-On levothyroxine.  ?  ?6. AKI due to ATN/shock ?- resolved ? ?7. Acute blood loss anemia ?- transfuse as needed to keep hgb >= 8.0 ? ?8. Fever ?- suspect atx. Most lines out ?- now resolved  ?- Encourage IS ? ?7. Lethargy ?- ? Due to pain meds ?- repeat co-ox  ?- monitor closely  ? ?8. B/l Pleural Effusions ?- moderate on CXR ?- diuresis per above ?- d/w Dr. Caffie Pinto, may need thoracentesis  ? ?Length of Stay: 6 ? ?Lyda Jester,  PA-C  ?02/09/2022, 8:10 AM ? ?Advanced Heart Failure Team ?Pager [phone] (M-F; 7a - 5p)  ?Please contact [facility] for night-coverage after hours (5p -7a ) and weekends on [email or web address] ? ?Agree with above.  ? ?On BIPAP this

## 2022-02-09 NOTE — Progress Notes (Signed)
? ?NAME:  Tonya Hamilton, MRN:  [id number], DOB:  [birth year], LOS: 6 ?ADMISSION DATE:  02/02/2022, CONSULTATION DATE:  02/03/22 ?REFERRING MD:  Christy Gentles CHIEF COMPLAINT:  Dyspnea  ? ?History of Present Illness:  ?Tonya Hamilton is a 76 y.o. female who has a PMH as outlined below.  She presented to [facility] ED 4/3 with dyspnea.  She states that symptoms began suddenly and progressed.  She also endorsed cough intimately productive of sputum.  No fevers/chills/sweats, chest pain, N/V/D, abdominal pain, myalgias, exposures known sick contacts, recent travel. ? ?In ED, she was found to be hypoxic.  CXR showed bilateral opacities and bilateral mild edema.  Troponin was elevated at 708 with repeat up to 1454.  BNP mildly elevated at 366.  EKG with ST depressions and global ischemia.  Leukocytosis to 23.6.  She was initially placed on Ventimask but was later upgraded to BiPAP.  She continued to require BiPAP which she did feel subjective improvement in symptoms.  Patient stable subsequently called for transfer to ICU at [facility] for further evaluation management. ? ?Pertinent  Medical History:  ?has History of colonic diverticulitis; Osteoarthritis; Hypertension; Hyperlipidemia; Hypothyroid; GERD (gastroesophageal reflux disease); Osteopenia; Anxiety; Osteoarthritis of right hip; Angioedema; Chronic intractable headache; Occipital neuralgia of left side; Neck pain; Hyperreflexia; Arthritis, multiple joint involvement; Spasmodic dysphonia; Acute respiratory failure ([place]); Respiratory failure ([place]); Community acquired pneumonia; Non-STEMI (non-ST elevated myocardial infarction) ([place]); Cardiogenic shock ([place]); Severe mitral regurgitation; Ischemic cardiomyopathy; Multiple vessel coronary artery disease; and S/P CABG x 4 on their problem list. ? ?Significant Hospital Events: ?Including procedures, antibiotic start and stop dates in addition to other pertinent events   ?4/4 admit, started on Cefepime and Azith and Bipap.   Trop rising, on heparin gtt,  Echo with reduced EF and regional wall motion abnormalities with severe MR, cardiology consulted and taken to cath lab which revealed severe LM and 3V disease.  IABP placed and intubated, in cardiogenic shock ?4/6 Lying in bed on IABP with progressive stability seen, plan for CABG with MVR tomorrow  ?4/7 OR to CABG and MVR, no issues overnight  ?4/8 Extubated and IABP removed.  Chest tubes removed ?4/9 up to chair. ? ? ?Interim History / Subjective:  ?No pain, nausea, vomiting. Overall she is feeling well. Placed back on bipap today for hypercapnia and encephalopathy. ? ?Objective:  ?Blood pressure (!) 123/53, pulse (!) 101, temperature (!) 97.5 ?F (36.4 ?C), temperature source Axillary, resp. rate 10, height '5\' 4"'$  (1.626 m), weight 66.7 kg, SpO2 96 %. ?   ?   ? ?Intake/Output Summary (Last 24 hours) at 02/09/2022 1422 ?Last data filed at 02/09/2022 1300 ?Gross per 24 hour  ?Intake 1178.16 ml  ?Output 1850 ml  ?Net -671.84 ml  ? ? ?Filed Weights  ? 02/07/22 0400 02/08/22 0500 02/09/22 0500  ?Weight: 63.7 kg 66.8 kg 66.7 kg  ? ?Physical Exam  ?General: Ill-appearing woman lying in bed no acute distress, on BiPAP ?HEENT: [place]/AT, eyes anicteric ?Neuro: PEEP appearing, but arouses to verbal stimulation.  Moving all extremities.   ?CV: S1-S2, regular rhythm, tachycardic ?PULM: Decreased basilar breath sounds, CTA anteriorly.  Synchronous with BiPAP. ?GI: Soft, nontender, nondistended ?Extremities: Minimal lower extremity edema; hand edema more prominent.  No cyanosis. ?Skin: No rashes, warm and dry ? ?Assessment & Plan:  ? ?Acute mixed cardiogenic and distributive shock following cardiac surgery requiring titration of norepinephrine, milrinone  ?Remains hypervolemic ?Coronary artery disease status post CABG ?Severe ischemic MR status post MVR ?-Postop care  per cardiothoracic surgery ?-Continue pain control meds, focus on nonopiate meds> agree with scheduled tylenol ?-Continue norepinephrine ?-  Daily cooximetry ?- Appreciate cardiology's management. ?- Increase Lasix to 12 mg/h; metolazone today ?-Coumadin per pharmacy dosing; minimum 3 months required ? ?Acute respiratory failure with hypoxia and hypercapnia ?Acute pulmonary edema ?Enlarging bilateral pleural effusions due to hypervolemia ?- Agree with BiPAP; may be beneficial to use overnight given high use of pain medications. ?- Anticipate if pleural effusions do not improve she will require thoracentesis ?- Agree with increasing diuresis ?- Mobility as able, pulmonary hygiene ? ?Hypothyroidism ?-synthroid ? ?Hypertension ?Dyslipidemia ?-Continue rosuvastatin.  History of myalgias with simvastatin. ?-Continue ASA ? ?Hyperglycemia, pre-diabetes ?-Basal bolus insulin ?- Sliding scale ?- Goal BG <180 ?-Needs outpatient follow-up with PCP for prediabetes ? ?Husband updated at bedside during rounds.  ? ?Best practice (evaluated daily):  ?Diet/type: Regular consistency (see orders) ?DVT prophylaxis: Prophylactic Lovenox. ?GI prophylaxis: H2B ?Lines: Right IJ introducer, arterial line. ?Foley:  Yes, and it is still needed ?Code Status:  full code ?Last date of multidisciplinary goals of care discussion:Family updated at bedside  ? ?This patient is critically ill with multiple organ system failure which requires frequent high complexity decision making, assessment, support, evaluation, and titration of therapies. This was completed through the application of advanced monitoring technologies and extensive interpretation of multiple databases. During this encounter critical care time was devoted to patient care services described in this note for 36 minutes. ? ?Julian Hy, DO 02/09/22 2:35 PM ?[facility] ? ?

## 2022-02-09 NOTE — Progress Notes (Addendum)
Nutrition Follow-up ? ?DOCUMENTATION CODES:  ? ?Not applicable ? ?INTERVENTION:  ? ?-Glucerna Shake po BID, each supplement provides 220 kcal and 10 grams of protein  ?-Ensure Max po daily, each supplement provides 150 kcal and 30 grams of protein ?-MVI with minerals daily ? ?NUTRITION DIAGNOSIS:  ? ?Inadequate oral intake related to acute illness as evidenced by NPO status. ? ?Progressing; advanced to PO diet on 02/07/22 ? ?GOAL:  ? ?Patient will meet greater than or equal to 90% of their needs ? ?Progressing  ? ?MONITOR:  ? ?PO intake, Supplement acceptance, Labs, Weight trends, Skin, I & O's ? ?REASON FOR ASSESSMENT:  ? ?Ventilator, Consult ?Enteral/tube feeding initiation and management ? ?ASSESSMENT:  ? ?76 yo female admitted with NSTEMI with acute cardiogenic shock, acute respiratory failure requiring intubation. PMH includes HTN, CAD, ischemic CM, diverticulitis, GERD ? ?4/4- s/p  Procedure(s) (LRB): ?RIGHT/LEFT HEART CATH AND CORONARY ANGIOGRAPHY (N/A) ?4/7- s/p CABG, MV replacement, and TEE ?IABP Insertion (N/A) ?4/8- extubated, IABP removed, advanced to PO diet ? ?Reviewed I/O's: +134 ml x 24 hours and -148 ml since admission ? ?UOP: 1.1 L x 24 hours ?  ?Pt unavailable at time of visit. Attempted to speak with pt via call to hospital room phone, however, unable to reach.  ? ?Per MD notes, pt groggy today secondary to pain meds. She is following commands.  ? ?Pt on a carb modified diet. No meal completion data available to assess at this time.  ? ?Medications reviewed and include dulcolax, colace, reglan, lasix, lactated ringers infusion @ 20 ml/hr, levophed,  ? ?Labs reviewed: Mg: 2.6, CBGS: 184 (inpatient orders for glycemic control are 0-15 units insulin aspart TID with meals and 10 units insulin detemir daily).   ? ?Diet Order:   ?Diet Order   ? ?       ?  Diet Carb Modified Fluid consistency: Thin; Room service appropriate? Yes  Diet effective now       ?  ? ?  ?  ? ?  ? ? ?EDUCATION NEEDS:  ? ?Not  appropriate for education at this time ? ?Skin:  Skin Assessment: Skin Integrity Issues: ?Skin Integrity Issues:: Incisions ?Incisions: closed chest, rt leg, lt leg ? ?Last BM:  02/03/22 ? ?Height:  ? ?Ht Readings from Last 1 Encounters:  ?02/09/22 '5\' 4"'$  (1.626 m)  ? ? ?Weight:  ? ?Wt Readings from Last 1 Encounters:  ?02/09/22 66.7 kg  ? ? ?BMI:  Body mass index is 25.24 kg/m?. ? ?Estimated Nutritional Needs:  ? ?Kcal:  1800-2000 ? ?Protein:  100-115 grams ? ?Fluid:  > 1.8 L ? ? ? ?Loistine Chance, RD, LDN, CDCES ?Registered Dietitian II ?Certified Diabetes Care and Education Specialist ?Please refer to [facility] for RD and/or RD on-call/weekend/after hours pager  ?

## 2022-02-10 ENCOUNTER — Inpatient Hospital Stay (HOSPITAL_COMMUNITY): Payer: Medicare HMO

## 2022-02-10 DIAGNOSIS — J90 Pleural effusion, not elsewhere classified: Secondary | ICD-10-CM | POA: Diagnosis not present

## 2022-02-10 DIAGNOSIS — J9601 Acute respiratory failure with hypoxia: Secondary | ICD-10-CM | POA: Diagnosis not present

## 2022-02-10 DIAGNOSIS — R5381 Other malaise: Secondary | ICD-10-CM

## 2022-02-10 DIAGNOSIS — R570 Cardiogenic shock: Secondary | ICD-10-CM | POA: Diagnosis not present

## 2022-02-10 LAB — COMPREHENSIVE METABOLIC PANEL
ALT: 102 U/L — ABNORMAL HIGH (ref 0–44)
ALT: 95 U/L — ABNORMAL HIGH (ref 0–44)
ALT: 84 U/L — ABNORMAL HIGH (ref 0–44)
AST: 89 U/L — ABNORMAL HIGH (ref 15–41)
AST: 67 U/L — ABNORMAL HIGH (ref 15–41)
AST: 53 U/L — ABNORMAL HIGH (ref 15–41)
Albumin: 2.6 g/dL — ABNORMAL LOW (ref 3.5–5.0)
Albumin: 2.6 g/dL — ABNORMAL LOW (ref 3.5–5.0)
Albumin: 2.7 g/dL — ABNORMAL LOW (ref 3.5–5.0)
Alkaline Phosphatase: 141 U/L — ABNORMAL HIGH (ref 38–126)
Alkaline Phosphatase: 135 U/L — ABNORMAL HIGH (ref 38–126)
Alkaline Phosphatase: 130 U/L — ABNORMAL HIGH (ref 38–126)
Anion gap: 10 (ref 5–15)
Anion gap: 9 (ref 5–15)
Anion gap: 11 (ref 5–15)
BUN: 18 mg/dL (ref 8–23)
BUN: 18 mg/dL (ref 8–23)
BUN: 23 mg/dL (ref 8–23)
CO2: 35 mmol/L — ABNORMAL HIGH (ref 22–32)
CO2: 36 mmol/L — ABNORMAL HIGH (ref 22–32)
CO2: 36 mmol/L — ABNORMAL HIGH (ref 22–32)
Calcium: 8.3 mg/dL — ABNORMAL LOW (ref 8.9–10.3)
Calcium: 8.5 mg/dL — ABNORMAL LOW (ref 8.9–10.3)
Calcium: 8.2 mg/dL — ABNORMAL LOW (ref 8.9–10.3)
Chloride: 94 mmol/L — ABNORMAL LOW (ref 98–111)
Chloride: 93 mmol/L — ABNORMAL LOW (ref 98–111)
Chloride: 87 mmol/L — ABNORMAL LOW (ref 98–111)
Creatinine, Ser: 0.95 mg/dL (ref 0.44–1.00)
Creatinine, Ser: 0.96 mg/dL (ref 0.44–1.00)
Creatinine, Ser: 1 mg/dL (ref 0.44–1.00)
GFR, Estimated: >60 mL/min (ref >60)
GFR, Estimated: >60 mL/min (ref >60)
GFR, Estimated: 58 mL/min — ABNORMAL LOW (ref >60)
Glucose, Bld: 135 mg/dL — ABNORMAL HIGH (ref 70–99)
Glucose, Bld: 154 mg/dL — ABNORMAL HIGH (ref 70–99)
Glucose, Bld: 131 mg/dL — ABNORMAL HIGH (ref 70–99)
Potassium: 3.2 mmol/L — ABNORMAL LOW (ref 3.5–5.1)
Potassium: 3.6 mmol/L (ref 3.5–5.1)
Potassium: 4 mmol/L (ref 3.5–5.1)
Sodium: 139 mmol/L (ref 135–145)
Sodium: 138 mmol/L (ref 135–145)
Sodium: 134 mmol/L — ABNORMAL LOW (ref 135–145)
Total Bilirubin: 0.9 mg/dL (ref 0.3–1.2)
Total Bilirubin: 0.8 mg/dL (ref 0.3–1.2)
Total Bilirubin: 0.9 mg/dL (ref 0.3–1.2)
Total Protein: 5.6 g/dL — ABNORMAL LOW (ref 6.5–8.1)
Total Protein: 5.5 g/dL — ABNORMAL LOW (ref 6.5–8.1)
Total Protein: 5.6 g/dL — ABNORMAL LOW (ref 6.5–8.1)

## 2022-02-10 LAB — CBC WITH DIFFERENTIAL/PLATELET
Abs Immature Granulocytes: 0.16 10*3/uL — ABNORMAL HIGH (ref 0.00–0.07)
Basophils Absolute: 0 10*3/uL (ref 0.0–0.1)
Basophils Relative: 0 %
Eosinophils Absolute: 0.2 10*3/uL (ref 0.0–0.5)
Eosinophils Relative: 1 %
HCT: 29.2 % — ABNORMAL LOW (ref 36.0–46.0)
Hemoglobin: 9.2 g/dL — ABNORMAL LOW (ref 12.0–15.0)
Immature Granulocytes: 1 %
Lymphocytes Relative: 9 %
Lymphs Abs: 1.7 10*3/uL (ref 0.7–4.0)
MCH: 28.3 pg (ref 26.0–34.0)
MCHC: 31.5 g/dL (ref 30.0–36.0)
MCV: 89.8 fL (ref 80.0–100.0)
Monocytes Absolute: 1.6 10*3/uL — ABNORMAL HIGH (ref 0.1–1.0)
Monocytes Relative: 8 %
Neutro Abs: 16.6 10*3/uL — ABNORMAL HIGH (ref 1.7–7.7)
Neutrophils Relative %: 81 %
Platelets: 371 10*3/uL (ref 150–400)
RBC: 3.25 MIL/uL — ABNORMAL LOW (ref 3.87–5.11)
RDW: 14.6 % (ref 11.5–15.5)
WBC: 20.4 10*3/uL — ABNORMAL HIGH (ref 4.0–10.5)
nRBC: 0.1 % (ref 0.0–0.2)

## 2022-02-10 LAB — POCT I-STAT 7, (LYTES, BLD GAS, ICA,H+H)
Acid-Base Excess: 8 mmol/L — ABNORMAL HIGH (ref 0.0–2.0)
Acid-Base Excess: 14 mmol/L — ABNORMAL HIGH (ref 0.0–2.0)
Bicarbonate: 33.6 mmol/L — ABNORMAL HIGH (ref 20.0–28.0)
Bicarbonate: 38 mmol/L — ABNORMAL HIGH (ref 20.0–28.0)
Calcium, Ion: 1.14 mmol/L — ABNORMAL LOW (ref 1.15–1.40)
Calcium, Ion: 1.06 mmol/L — ABNORMAL LOW (ref 1.15–1.40)
HCT: 29 % — ABNORMAL LOW (ref 36.0–46.0)
HCT: 29 % — ABNORMAL LOW (ref 36.0–46.0)
Hemoglobin: 9.9 g/dL — ABNORMAL LOW (ref 12.0–15.0)
Hemoglobin: 9.9 g/dL — ABNORMAL LOW (ref 12.0–15.0)
O2 Saturation: 98 %
O2 Saturation: 99 %
Patient temperature: 98.5
Potassium: 3.9 mmol/L (ref 3.5–5.1)
Potassium: 3.3 mmol/L — ABNORMAL LOW (ref 3.5–5.1)
Sodium: 136 mmol/L (ref 135–145)
Sodium: 136 mmol/L (ref 135–145)
TCO2: 35 mmol/L — ABNORMAL HIGH (ref 22–32)
TCO2: 39 mmol/L — ABNORMAL HIGH (ref 22–32)
pCO2 arterial: 52 mmHg — ABNORMAL HIGH (ref 32–48)
pCO2 arterial: 42.7 mmHg (ref 32–48)
pH, Arterial: 7.419 (ref 7.35–7.45)
pH, Arterial: 7.557 — ABNORMAL HIGH (ref 7.35–7.45)
pO2, Arterial: 114 mmHg — ABNORMAL HIGH (ref 83–108)
pO2, Arterial: 103 mmHg (ref 83–108)

## 2022-02-10 LAB — COOXEMETRY PANEL
Carboxyhemoglobin: 1.5 % (ref 0.5–1.5)
Methemoglobin: <0.7 % (ref 0.0–1.5)
O2 Saturation: 70.8 %
Total hemoglobin: 9.9 g/dL — ABNORMAL LOW (ref 12.0–16.0)

## 2022-02-10 LAB — PROTIME-INR
INR: 1.1 (ref 0.8–1.2)
Prothrombin Time: 14.3 seconds (ref 11.4–15.2)

## 2022-02-10 LAB — MAGNESIUM: Magnesium: 2.1 mg/dL (ref 1.7–2.4)

## 2022-02-10 LAB — GLUCOSE, CAPILLARY
Glucose-Capillary: 178 mg/dL — ABNORMAL HIGH (ref 70–99)
Glucose-Capillary: 159 mg/dL — ABNORMAL HIGH (ref 70–99)
Glucose-Capillary: 153 mg/dL — ABNORMAL HIGH (ref 70–99)

## 2022-02-10 MED ORDER — WARFARIN SODIUM 2.5 MG PO TABS
2.5000 mg | ORAL_TABLET | Freq: Every day | ORAL | Status: AC
  Administered 2022-02-10: 2.5 mg via ORAL
  Filled 2022-02-10: qty 1

## 2022-02-10 MED ORDER — ACETAZOLAMIDE 250 MG PO TABS
250.0000 mg | ORAL_TABLET | Freq: Once | ORAL | Status: AC
  Administered 2022-02-10: 250 mg via ORAL
  Filled 2022-02-10: qty 1

## 2022-02-10 MED ORDER — SODIUM CHLORIDE 0.9 % IV SOLN
12.5000 mg | Freq: Three times a day (TID) | INTRAVENOUS | Status: DC | PRN
  Administered 2022-02-10 (×2): 12.5 mg via INTRAVENOUS
  Filled 2022-02-10 (×3): qty 0.5

## 2022-02-10 MED ORDER — POTASSIUM CHLORIDE 10 MEQ/50ML IV SOLN
10.0000 meq | INTRAVENOUS | Status: AC
  Administered 2022-02-10 (×6): 10 meq via INTRAVENOUS
  Filled 2022-02-10 (×4): qty 50

## 2022-02-10 MED ORDER — POTASSIUM CHLORIDE 10 MEQ/50ML IV SOLN
10.0000 meq | INTRAVENOUS | Status: AC
  Administered 2022-02-10 (×2): 10 meq via INTRAVENOUS
  Filled 2022-02-10 (×2): qty 50

## 2022-02-10 MED ORDER — AMIODARONE HCL IN DEXTROSE 360-4.14 MG/200ML-% IV SOLN
60.0000 mg/h | INTRAVENOUS | Status: DC
  Administered 2022-02-10 (×2): 60 mg/h via INTRAVENOUS
  Filled 2022-02-10 (×2): qty 200

## 2022-02-10 MED ORDER — ACETAZOLAMIDE SODIUM 500 MG IJ SOLR
250.0000 mg | Freq: Once | INTRAMUSCULAR | Status: AC
  Administered 2022-02-10: 250 mg via INTRAVENOUS
  Filled 2022-02-10: qty 250

## 2022-02-10 MED ORDER — ACETAZOLAMIDE SODIUM 500 MG IJ SOLR
500.0000 mg | Freq: Once | INTRAMUSCULAR | Status: DC
  Filled 2022-02-10: qty 500

## 2022-02-10 MED ORDER — POTASSIUM CHLORIDE CRYS ER 20 MEQ PO TBCR
40.0000 meq | EXTENDED_RELEASE_TABLET | Freq: Once | ORAL | Status: AC
  Administered 2022-02-10: 40 meq via ORAL
  Filled 2022-02-10: qty 2

## 2022-02-10 MED ORDER — METOCLOPRAMIDE HCL 5 MG/ML IJ SOLN
10.0000 mg | Freq: Four times a day (QID) | INTRAMUSCULAR | Status: AC
  Administered 2022-02-10 – 2022-02-11 (×4): 10 mg via INTRAVENOUS
  Filled 2022-02-10 (×4): qty 2

## 2022-02-10 MED ORDER — ONDANSETRON HCL 4 MG/2ML IJ SOLN
4.0000 mg | Freq: Four times a day (QID) | INTRAMUSCULAR | Status: DC | PRN
  Administered 2022-02-10 – 2022-02-13 (×3): 4 mg via INTRAVENOUS
  Filled 2022-02-10 (×3): qty 2

## 2022-02-10 MED ORDER — POTASSIUM CHLORIDE CRYS ER 20 MEQ PO TBCR
60.0000 meq | EXTENDED_RELEASE_TABLET | Freq: Once | ORAL | Status: AC
Start: 2022-02-10 — End: 2022-02-10
  Administered 2022-02-10: 60 meq via ORAL
  Filled 2022-02-10: qty 3

## 2022-02-10 MED ORDER — AMIODARONE LOAD VIA INFUSION
150.0000 mg | Freq: Once | INTRAVENOUS | Status: AC
  Administered 2022-02-10: 150 mg via INTRAVENOUS
  Filled 2022-02-10: qty 83.34

## 2022-02-10 MED ORDER — AMIODARONE HCL IN DEXTROSE 360-4.14 MG/200ML-% IV SOLN
30.0000 mg/h | INTRAVENOUS | Status: DC
Start: 2022-02-10 — End: 2022-02-12
  Administered 2022-02-10 – 2022-02-12 (×4): 30 mg/h via INTRAVENOUS
  Filled 2022-02-10 (×5): qty 200

## 2022-02-10 MED ORDER — MELATONIN 3 MG PO TABS
3.0000 mg | ORAL_TABLET | Freq: Every day | ORAL | Status: DC
  Administered 2022-02-10 – 2022-02-18 (×9): 3 mg via ORAL
  Filled 2022-02-10 (×9): qty 1

## 2022-02-10 MED ORDER — CALCIUM GLUCONATE-NACL 1-0.675 GM/50ML-% IV SOLN
1.0000 g | Freq: Once | INTRAVENOUS | Status: AC
  Administered 2022-02-10: 1000 mg via INTRAVENOUS
  Filled 2022-02-10: qty 50

## 2022-02-10 MED ORDER — ASPIRIN EC 81 MG PO TBEC
81.0000 mg | DELAYED_RELEASE_TABLET | Freq: Every day | ORAL | Status: DC
Start: 2022-02-10 — End: 2022-02-19
  Administered 2022-02-10 – 2022-02-19 (×10): 81 mg via ORAL
  Filled 2022-02-10 (×10): qty 1

## 2022-02-10 NOTE — Progress Notes (Signed)
4 Days Post-Op Procedure(s) (LRB): ?CORONARY ARTERY BYPASS GRAFTING X4, Using Left and Right Greater Saphenous Vein Harvested Endoscopically (N/A) ?MITRAL VALVE (MV) REPLACEMENT Using 25MM Mitris Valve (N/A) ?TRANSESOPHAGEAL ECHOCARDIOGRAM (TEE) (N/A) ?Subjective: ?No complaints this am. ?Hemodynamically stable overnight on NE 8. Co-ox 71 this am. ?Diuresed very well on lasix 12. -4500 cc/24 hrs. ? ?Only tolerated bipap for a few hrs last night and then felt nauseous and refused. ABG looked ok this am with PCO2 43. Metabolic alkalosis probably from diuresis. Received a dose of diamox for some reason. Just needs K+ replacement. ? ?Went into atrial flutter overnight probably related to hypokalemia. Some rapid rate but this am 90's. I tried to RAP but could not capture. ? ?Objective: ?Vital signs in last 24 hours: ?Temp:  [97.5 ?F (36.4 ?C)-98.5 ?F (36.9 ?C)] 98.4 ?F (36.9 ?C) (04/11 0400) ?Pulse Rate:  [67-107] 91 (04/11 0600) ?Cardiac Rhythm: Normal sinus rhythm;Sinus tachycardia (04/10 2000) ?Resp:  [9-20] 17 (04/11 0600) ?BP: (98-137)/(45-67) 124/56 (04/11 0600) ?SpO2:  [93 %-100 %] 96 % (04/11 0600) ? ?Hemodynamic parameters for last 24 hours: ?CVP:  [9 mmHg-13 mmHg] 13 mmHg ? ?Intake/Output from previous day: ?04/10 0701 - 04/11 0700 ?In: 1189.4 [P.O.:100; I.V.:898.4; IV Piggyback:190.9] ?Out: 5705 [[id number]] ?Intake/Output this shift: ?No intake/output data recorded. ? ?General appearance: more alert this am. ?Neurologic: intact ?Heart: regularly irregular rhythm, no murmur ?Lungs: clear to auscultation bilaterally but decreased in bases ?Abdomen: soft, non-tender; bowel sounds normal ?Extremities: edema mild ?Wound: incisions ok ? ?Lab Results: ?Recent Labs  ?  02/09/22 ?0345 02/09/22 ?1031 02/10/22 ?0127 02/10/22 ?5945  ?WBC 23.3*  --  20.4*  --   ?HGB 9.3*   < > 9.2* 9.9*  ?HCT 29.7*   < > 29.2* 29.0*  ?PLT 282  --  371  --   ? < > = values in this interval not displayed.  ? ?BMET:  ?Recent Labs  ?   02/10/22 ?0127 02/10/22 ?0204 02/10/22 ?0636  ?NA 139 136 138  ?K 3.2* 3.3* 3.6  ?CL 94*  --  93*  ?CO2 35*  --  36*  ?GLUCOSE 135*  --  154*  ?BUN 18  --  18  ?CREATININE 0.95  --  0.96  ?CALCIUM 8.3*  --  8.5*  ?  ?PT/INR: No results for input(s): LABPROT, INR in the last 72 hours. ?ABG ?   ?Component Value Date/Time  ? PHART 7.557 (H) 02/10/2022 8592  ? HCO3 38.0 (H) 02/10/2022 9244  ? TCO2 39 (H) 02/10/2022 6286  ? ACIDBASEDEF 3.0 (H) 02/07/2022 1010  ? O2SAT 99 02/10/2022 0204  ? ?CBG (last 3)  ?Recent Labs  ?  02/09/22 ?0732 02/09/22 ?1142 02/09/22 ?1622  ?GLUCAP 184* 125* 114*  ? ?CXR: stable bilateral pleural effusion and bibasilar atelectasis. ? ?Assessment/Plan: ?S/P Procedure(s) (LRB): ?CORONARY ARTERY BYPASS GRAFTING X4, Using Left and Right Greater Saphenous Vein Harvested Endoscopically (N/A) ?MITRAL VALVE (MV) REPLACEMENT Using 25MM Mitris Valve (N/A) ?TRANSESOPHAGEAL ECHOCARDIOGRAM (TEE) (N/A) ? ?POD 4 ?Hemodynamically stable on NE 8. Wean for MAP>65. Co-ox good.  ? ?Postop atrial flutter with some RVR but now 90's. Failed RAP this am. Start amio. Replace K+. Keep pacer attached for now in case she blocks down. ? ?Volume excess: much improved with diuresis yesterday. Wt down 9 lbs and now about 8-10 lbs over preop. Lasix decreased to 10 overnight. Replace K+ to correct alkalosis. ? ?INR still pending. Continue Coumadin ? ?Pleural effusions stable. Expect this to improve with continued diuresis. ? ?  PT/OT, IS, OOB. ? ? LOS: 7 days  ? ? ?[name] ?02/10/2022 ? ? ?

## 2022-02-10 NOTE — Progress Notes (Signed)
? ?NAME:  Tonya Hamilton, MRN:  [id number], DOB:  12/07/1945, LOS: 7 ?ADMISSION DATE:  02/02/2022, CONSULTATION DATE:  02/03/22 ?REFERRING MD:  Christy Gentles CHIEF COMPLAINT:  Dyspnea  ? ?History of Present Illness:  ?Tonya Hamilton is a 76 y.o. female who has a PMH as outlined below.  She presented to [facility] ED 4/3 with dyspnea.  She states that symptoms began suddenly and progressed.  She also endorsed cough intimately productive of sputum.  No fevers/chills/sweats, chest pain, N/V/D, abdominal pain, myalgias, exposures known sick contacts, recent travel. ? ?In ED, she was found to be hypoxic.  CXR showed bilateral opacities and bilateral mild edema.  Troponin was elevated at 708 with repeat up to 1454.  BNP mildly elevated at 366.  EKG with ST depressions and global ischemia.  Leukocytosis to 23.6.  She was initially placed on Ventimask but was later upgraded to BiPAP.  She continued to require BiPAP which she did feel subjective improvement in symptoms.  Patient stable subsequently called for transfer to ICU at [facility] for further evaluation management. ? ?Pertinent  Medical History:  ?has History of colonic diverticulitis; Osteoarthritis; Hypertension; Hyperlipidemia; Hypothyroid; GERD (gastroesophageal reflux disease); Osteopenia; Anxiety; Osteoarthritis of right hip; Angioedema; Chronic intractable headache; Occipital neuralgia of left side; Neck pain; Hyperreflexia; Arthritis, multiple joint involvement; Spasmodic dysphonia; Acute respiratory failure ([place]); Respiratory failure ([place]); Community acquired pneumonia; Non-STEMI (non-ST elevated myocardial infarction) ([place]); Cardiogenic shock ([place]); Severe mitral regurgitation; Ischemic cardiomyopathy; Multiple vessel coronary artery disease; and S/P CABG x 4 on their problem list. ? ?Significant Hospital Events: ?Including procedures, antibiotic start and stop dates in addition to other pertinent events   ?4/4 admit, started on Cefepime and Azith and Bipap.   Trop rising, on heparin gtt,  Echo with reduced EF and regional wall motion abnormalities with severe MR, cardiology consulted and taken to cath lab which revealed severe LM and 3V disease.  IABP placed and intubated, in cardiogenic shock ?4/6 Lying in bed on IABP with progressive stability seen, plan for CABG with MVR tomorrow  ?4/7 OR to CABG and MVR, no issues overnight  ?4/8 Extubated and IABP removed.  Chest tubes removed ?4/9 up to chair. ?4/10 improved UOP with metolazone and increased lasix ? ? ?Interim History / Subjective:  ?She denies complaints. Bowel movement this morning. Pain is well controlled. Remains on NE 6, lasix 10, amiodarone. ? ?Objective:  ?Blood pressure (!) 124/56, pulse 91, temperature 98.4 ?F (36.9 ?C), temperature source Axillary, resp. rate 17, height '5\' 4"'$  (1.626 m), weight 62.7 kg, SpO2 96 %. ?CVP:  [9 mmHg-13 mmHg] 13 mmHg  ?   ? ?Intake/Output Summary (Last 24 hours) at 02/10/2022 0731 ?Last data filed at 02/10/2022 0600 ?Gross per 24 hour  ?Intake 1189.36 ml  ?Output 5705 ml  ?Net -4515.64 ml  ? ? ?Filed Weights  ? 02/08/22 0500 02/09/22 0500 02/10/22 0700  ?Weight: 66.8 kg 66.7 kg 62.7 kg  ? ?Physical Exam  ?General: frail appearing woman lying in bed in NAD ?HEENT: [place]/AT, eyes anicteric ?Neuro: Sleepy but arouses to verbal stimulation, answering questions appropriately. Moving all extremities. ?CV: S1S2, mildly tachypneic, irreg rhythm.  ?PULM: Breathing comfortably on [place], reduced basilar breath sounds.  ?GI: Soft, NT, hypoactive bowel sounds.  ?Extremities: mild peripheral edema, no cyanosis ?Skin: no rashes, warm & dry. Sternal incision healing well. ? ?7.56/43/103/38 on [place] ?BUN 36 ?BUN 18 ?Cr 0.96 ?AST 67 ?ALT 95 ?T bili 0.8 ?WBC 20.4 ?H/H 6.2/29.2 ?Coox 70.8% ? ?Assessment & Plan:  ? ?  Acute mixed cardiogenic and distributive shock following cardiac surgery requiring titration of norepinephrine, milrinone  ?Remains hypervolemic ?Coronary artery disease status post CABG ?Severe  ischemic MR status post MVR ?Atrial flutter ?- Continue postop care per cardiothoracic surgery. ?- Continue pain control per protocol, focus on nonopiate pain control given history of hypercapnia. ?- Continue norepinephrine. ?- Daily cooximetry. ?-Appreciate cardiology's management ?- Continue aggressive diuresis; remains on Lasix 10 mg/h + diamox ?- Coumadin per pharmacy dosing for 3 months ?-Amiodarone infusion ? ?Acute respiratory failure with hypoxia and hypercapnia; hypercapnia now resolved.  This was likely due to side effect of opiate pain control. ?Acute pulmonary edema ?Enlarging bilateral pleural effusions due to hypervolemia ?- Agree with BiPAP as needed. ?- Pulmonary hygiene ?- Out of bed mobility as able ?- Daily chest x-ray.  Continue monitoring pleural effusions.  Would diurese to euvolemia before attempting thoracentesis unless she develops respiratory decompensation. ? ?Hypothyroidism ?-Continue Synthroid ? ?Hypertension ?Dyslipidemia ?- Continue trial of rosuvastatin.  History of myalgias with simvastatin. ?- Continue aspirin. ? ?Hyperglycemia, pre-diabetes ?- Continue basal bolus insulin ?- Continue sliding scale insulin ?- Goal blood glucose less than 180 ?- Needs outpatient follow-up for prediabetes. ? ?Insomnia  ?-Continue melatonin at night ?-Focus on sleep hygiene measures ? ?Deconditioning ?- PT, OT ?- Out of bed mobility ? ?No family bedside during rounds. ? ?Best practice (evaluated daily):  ?Diet/type: Regular consistency (see orders) ?DVT prophylaxis: Prophylactic Lovenox. ?GI prophylaxis: H2B ?Lines: Right IJ introducer, arterial line. ?Foley:  Yes, and it is still needed ?Code Status:  full code ?Last date of multidisciplinary goals of care discussion: Family updated at bedside  ? ?This patient is critically ill with multiple organ system failure which requires frequent high complexity decision making, assessment, support, evaluation, and titration of therapies. This was completed  through the application of advanced monitoring technologies and extensive interpretation of multiple databases. During this encounter critical care time was devoted to patient care services described in this note for 35 minutes. ? ?Julian Hy, DO 02/10/22 9:44 AM ?[facility] ? ?

## 2022-02-10 NOTE — Evaluation (Signed)
Physical Therapy Evaluation ?Patient Details ?Name: Tonya Hamilton ?MRN: [id number] ?DOB: [birth year] ?Today's Date: 02/10/2022 ? ?History of Present Illness ? Pt is a 76 y.o. F who presents 4/3 with dyspnea. Echo with reduced EF and regional wall motion abnormalities with severe MR, cardiology consulted and taken to cath lab which revealed severe LM and 3V disease.  IABP placed and intubated, in cardiogenic shock. S/p CABG and MVR 4/7. Significant PMH: diverticulitis, OA, HTN, HLD, osteopenia, anxiety.  ?Clinical Impression ? PTA, pt lives with her spouse and is independent. Pt presents with decreased functional mobility secondary to generalized weakness/debility, poor sitting and standing balance, decreased cardiopulmonary endurance, cognitive impairments, and decreased coordination. Pt drowsy, but able to participate in therapy session fully. Pt performed seated warm up exercise, toileting and transfer training. Pt requiring two person moderate assist to stand multiple times from various surfaces. Unable to ambulate quite yet due to retropulsion, but suspect pt will make steady progress based on PLOF, motivation and family support. Recommend AIR to address deficits, maximize functional mobility and decrease caregiver burden. ?   ? ?Recommendations for follow up therapy are one component of a multi-disciplinary discharge planning process, led by the attending physician.  Recommendations may be updated based on patient status, additional functional criteria and insurance authorization. ? ?Follow Up Recommendations Acute inpatient rehab (3hours/day) ? ?  ?Assistance Recommended at Discharge Frequent or constant Supervision/Assistance  ?Patient can return home with the following ? Two people to help with walking and/or transfers;A lot of help with bathing/dressing/bathroom ? ?  ?Equipment Recommendations Rolling [place] (2 wheels);BSC/3in1  ?Recommendations for Other Services ? Rehab consult  ?  ?Functional Status  Assessment Patient has had a recent decline in their functional status and demonstrates the ability to make significant improvements in function in a reasonable and predictable amount of time.  ? ?  ?Precautions / Restrictions Precautions ?Precautions: Sternal;Fall ?Precaution Booklet Issued: No ?Precaution Comments: verbally reviewed with pt husband ?Restrictions ?Weight Bearing Restrictions: Yes ?Other Position/Activity Restrictions: sternal precautions  ? ?  ? ?Mobility ? Bed Mobility ?  ?  ?  ?  ?  ?  ?  ?General bed mobility comments: OOB in chair ?  ? ?Transfers ?Overall transfer level: Needs assistance ?Equipment used: Rolling [place] (2 wheels) ?Transfers: Sit to/from Stand ?Sit to Stand: Mod assist, +2 physical assistance ?  ?  ?  ?  ?  ?General transfer comment: ModA + 2 to rise from edge of chair and BSC x 3, with cues for hands on knees vs on sternal pillow, rocking forward for momentum, nose over toes. Repeated cues for glute/quad activation and anterior weight shift. Tendency for retropulsion ?  ? ?Ambulation/Gait ?  ?  ?  ?  ?  ?  ?  ?General Gait Details: unable ? ?Stairs ?  ?  ?  ?  ?  ? ?Wheelchair Mobility ?  ? ?Modified Rankin (Stroke Patients Only) ?  ? ?  ? ?Balance Overall balance assessment: Needs assistance ?Sitting-balance support: Feet supported ?Sitting balance-Leahy Scale: Poor ?Sitting balance - Comments: needs assist when trunk is unsupported ?  ?Standing balance support: Bilateral upper extremity supported, Reliant on assistive device for balance ?Standing balance-Leahy Scale: Poor ?Standing balance comment: reliant on RW ?  ?  ?  ?  ?  ?  ?  ?  ?  ?  ?  ?   ? ? ? ?Pertinent Vitals/Pain Pain Assessment ?Pain Assessment: Faces ?Faces Pain Scale: Hurts a little bit ?Pain  Location: incisional ?Pain Descriptors / Indicators: Guarding ?Pain Intervention(s): Monitored during session  ? ? ?Home Living Family/patient expects to be discharged to:: Private residence ?Living Arrangements:  Spouse/significant other ?Available Help at Discharge: Family ?Type of Home: House ?Home Access: Stairs to enter ?  ?Entrance Stairs-Number of Steps: 1 ?  ?Home Layout: Able to live on main level with bedroom/bathroom ?Home Equipment: Kasandra Knudsen - single point ?   ?  ?Prior Function Prior Level of Function : Independent/Modified Independent ?  ?  ?  ?  ?  ?  ?  ?  ?  ? ? ?Hand Dominance  ? Dominant Hand: Right ? ?  ?Extremity/Trunk Assessment  ? Upper Extremity Assessment ?Upper Extremity Assessment: Defer to OT evaluation ?  ? ?Lower Extremity Assessment ?Lower Extremity Assessment: RLE deficits/detail;LLE deficits/detail ?RLE Deficits / Details: Grossly 3-/5 ?LLE Deficits / Details: Grossly 3-/5 ?  ? ?   ?Communication  ? Communication: No difficulties  ?Cognition Arousal/Alertness: Lethargic ?Behavior During Therapy: Flat affect ?Overall Cognitive Status: Impaired/Different from baseline ?Area of Impairment: Attention, Memory, Following commands, Awareness, Safety/judgement, Problem solving ?  ?  ?  ?  ?  ?  ?  ?  ?  ?Current Attention Level: Sustained ?Memory: Decreased short-term memory, Decreased recall of precautions ?Following Commands: Follows one step commands inconsistently ?Safety/Judgement: Decreased awareness of safety, Decreased awareness of deficits ?Awareness: Intellectual ?Problem Solving: Decreased initiation, Requires verbal cues, Slow processing ?General Comments: Pt husband reports cognition is not at baseline (typically very sharp and independent). Pt A&Ox4, but not oriented to day of week. Pt with slow response time, following 1 step commands inconsistently, needs repeated cues to complete task and keep eyes open to mimic command and demonstration. Pt seemingly lethargic, keeping eyes closed majority of session. ?  ?  ? ?  ?General Comments General comments (skin integrity, edema, etc.): VSS on 3L o2 ? ?  ?Exercises General Exercises - Lower Extremity ?Ankle Circles/Pumps: Both, 10 reps,  Seated ?Long Arc Quad: Both, 10 reps, Seated ?Hip ABduction/ADduction: Both, 10 reps, Seated  ? ?Assessment/Plan  ?  ?PT Assessment Patient needs continued PT services  ?PT Problem List Decreased strength;Decreased activity tolerance;Decreased mobility;Decreased balance;Decreased cognition;Decreased coordination;Decreased safety awareness;Decreased knowledge of precautions;Cardiopulmonary status limiting activity;Pain ? ?   ?  ?PT Treatment Interventions DME instruction;Gait training;Functional mobility training;Therapeutic activities;Therapeutic exercise;Balance training;Patient/family education   ? ?PT Goals (Current goals can be found in the Care Plan section)  ?Acute Rehab PT Goals ?Patient Stated Goal: pt husband agreeable to rehab ?PT Goal Formulation: With patient/family ?Time For Goal Achievement: 02/24/22 ?Potential to Achieve Goals: Good ? ?  ?Frequency Min 3X/week ?  ? ? ?Co-evaluation   ?  ?  ?  ?  ? ? ?  ?AM-PAC PT "6 Clicks" Mobility  ?Outcome Measure Help needed turning from your back to your side while in a flat bed without using bedrails?: A Lot ?Help needed moving from lying on your back to sitting on the side of a flat bed without using bedrails?: A Lot ?Help needed moving to and from a bed to a chair (including a wheelchair)?: A Lot ?Help needed standing up from a chair using your arms (e.g., wheelchair or bedside chair)?: A Lot ?Help needed to walk in hospital room?: Total ?Help needed climbing 3-5 steps with a railing? : Total ?6 Click Score: 10 ? ?  ?End of Session Equipment Utilized During Treatment: Oxygen ?Activity Tolerance: Patient limited by fatigue ?Patient left: in chair;with call bell/phone within reach;with  family/visitor present ?Nurse Communication: Mobility status ?PT Visit Diagnosis: Unsteadiness on feet (R26.81);Muscle weakness (generalized) (M62.81);Difficulty in walking, not elsewhere classified (R26.2) ?  ? ?Time: 4580-9983 ?PT Time Calculation (min) (ACUTE ONLY): 39  min ? ? ?Charges:   PT Evaluation ?$PT Eval Moderate Complexity: 1 Mod ?PT Treatments ?$Therapeutic Activity: 23-37 mins ?  ?   ? ? ?Wyona Almas, PT, DPT ?Acute Rehabilitation Services ?Pager [phone] ?Office [phone] ?

## 2022-02-10 NOTE — Progress Notes (Addendum)
?  ? ? Advanced Heart Failure Rounding Note ? ?PCP-Cardiologist: None  ? ?Subjective:   ? ?76 y/o with severe CAD/NSTEMI and iCM EF 30-35%. Developed refractory angina and shock with respiratory failure. Intubated and IABP placed.  ?4/7 CABG + MVR  ?4/8 Extubated, IABP removed  ? ?POD#4 ? ?In Atrial flutter today, V-rates 90s-low 100s.  ? ?Remains on NE 6. Off milrinone. Co-ox 71%. Good diuresis yesterday w/ lasix gtt, metolazone and diamox, 5.7 L in UOP.  ? ?Wt down 9 lb. Still ~10 lb above preop. CVP only 7 but suspect 3rd spacing  ? ?SCr stable, 0.96 ? ?K 3.6  ? ?CO2 remains elevated 36 ? ?WBC trending down, 23>>20K. Afebrile.  ?Hgb stable, 9.9 ? ?More alert today. Denies any current chest/ post op pain. CXR today shows persistent b/l pleural effusions.  ? ? ?Objective:   ?Weight Range: ?62.7 kg ?Body mass index is 23.73 kg/m?.  ? ?Vital Signs:   ?Temp:  [97.5 ?F (36.4 ?C)-98.5 ?F (36.9 ?C)] 98.4 ?F (36.9 ?C) (04/11 0400) ?Pulse Rate:  [67-107] 91 (04/11 0600) ?Resp:  [9-20] 17 (04/11 0600) ?BP: (98-137)/(45-67) 124/56 (04/11 0600) ?SpO2:  [93 %-100 %] 96 % (04/11 0600) ?Weight:  [62.7 kg] 62.7 kg (04/11 0700) ?Last BM Date : 02/03/22 ? ?Weight change: ?Filed Weights  ? 02/08/22 0500 02/09/22 0500 02/10/22 0700  ?Weight: 66.8 kg 66.7 kg 62.7 kg  ? ? ?Intake/Output:  ? ?Intake/Output Summary (Last 24 hours) at 02/10/2022 0729 ?Last data filed at 02/10/2022 0600 ?Gross per 24 hour  ?Intake 1189.36 ml  ?Output 5705 ml  ?Net -4515.64 ml  ?  ? ? ?Physical Exam  ? ?CVP 7  ?General:  fatigued appearing. No respiratory difficulty ?HEENT: normal ?Neck: supple. JVD 7-8 cm. +Rt IJ CVC, Carotids 2+ bilat; no bruits. No lymphadenopathy or thyromegaly appreciated. ?Cor: PMI nondisplaced. Irregular rhythm and rate. No rubs, gallops or murmurs. + sternotomy site stable  ?Lungs: decreased BS at the bases bilaterally  ?Abdomen: soft, nontender, nondistended. No hepatosplenomegaly. No bruits or masses. Good bowel  sounds. ?Extremities: no cyanosis, clubbing, rash, trace b/l LE edema + SCDs  ?Neuro: alert & oriented x 3, cranial nerves grossly intact. moves all 4 extremities w/o difficulty. Affect pleasant. ?GU: + Foley  ? ? ?Telemetry  ? ?Atrial Flutter, 90s-low 100s Personally reviewed ? ?Labs  ?  ?CBC ?Recent Labs  ?  02/09/22 ?0345 02/09/22 ?9735 02/10/22 ?0127 02/10/22 ?3299  ?WBC 23.3*  --  20.4*  --   ?NEUTROABS  --   --  16.6*  --   ?HGB 9.3*   < > 9.2* 9.9*  ?HCT 29.7*   < > 29.2* 29.0*  ?MCV 91.4  --  89.8  --   ?PLT 282  --  371  --   ? < > = values in this interval not displayed.  ? ?Basic Metabolic Panel ?Recent Labs  ?  02/07/22 ?1648 02/08/22 ?0315 02/10/22 ?0127 02/10/22 ?0204 02/10/22 ?0636  ?NA 138   < > 139 136 138  ?K 4.0   < > 3.2* 3.3* 3.6  ?CL 109   < > 94*  --  93*  ?CO2 21*   < > 35*  --  36*  ?GLUCOSE 177*   < > 135*  --  154*  ?BUN 19   < > 18  --  18  ?CREATININE 0.94   < > 0.95  --  0.96  ?CALCIUM 7.5*   < > 8.3*  --  8.5*  ?MG 2.6*  --  2.1  --   --   ? < > = values in this interval not displayed.  ? ?Liver Function Tests ?Recent Labs  ?  02/10/22 ?0127 02/10/22 ?0636  ?AST 89* 67*  ?ALT 102* 95*  ?ALKPHOS 141* 135*  ?BILITOT 0.9 0.8  ?PROT 5.6* 5.5*  ?ALBUMIN 2.6* 2.6*  ? ?No results for input(s): LIPASE, AMYLASE in the last 72 hours. ?Cardiac Enzymes ?No results for input(s): CKTOTAL, CKMB, CKMBINDEX, TROPONINI in the last 72 hours. ? ?BNP: ?BNP (last 3 results) ?Recent Labs  ?  02/02/22 ?2321  ?BNP 366.4*  ? ? ?ProBNP (last 3 results) ?No results for input(s): PROBNP in the last 8760 hours. ? ? ?D-Dimer ?No results for input(s): DDIMER in the last 72 hours. ?Hemoglobin A1C ?No results for input(s): HGBA1C in the last 72 hours. ? ?Fasting Lipid Panel ?No results for input(s): CHOL, HDL, LDLCALC, TRIG, CHOLHDL, LDLDIRECT in the last 72 hours. ? ?Thyroid Function Tests ?No results for input(s): TSH, T4TOTAL, T3FREE, THYROIDAB in the last 72 hours. ? ?Invalid input(s): FREET3 ? ? ?Other  results: ? ? ?Imaging  ? ? ?No results found. ? ? ?Medications:   ? ? ?Scheduled Medications: ? acetaminophen  1,000 mg Oral Q6H  ? amiodarone  150 mg Intravenous Once  ? aspirin EC  81 mg Oral Daily  ? bisacodyl  10 mg Oral Daily  ? Or  ? bisacodyl  10 mg Rectal Daily  ? Chlorhexidine Gluconate Cloth  6 each Topical Daily  ? docusate sodium  200 mg Oral Daily  ? enoxaparin (LOVENOX) injection  40 mg Subcutaneous QHS  ? feeding supplement (GLUCERNA SHAKE)  237 mL Oral BID BM  ? insulin aspart  0-15 Units Subcutaneous TID WC  ? insulin detemir  10 Units Subcutaneous Daily  ? levothyroxine  25 mcg Oral Q0600  ? lidocaine  2 patch Transdermal Q24H  ? mouth rinse  15 mL Mouth Rinse BID  ? multivitamin with minerals  1 tablet Oral Daily  ? mupirocin ointment  1 application. Nasal BID  ? pantoprazole  40 mg Oral Daily  ? Ensure Max Protein  11 oz Oral QHS  ? rosuvastatin  5 mg Oral Daily  ? sodium chloride flush  10-40 mL Intracatheter Q12H  ? sodium chloride flush  3 mL Intravenous Q12H  ? Warfarin - Physician Dosing Inpatient   Does not apply q1600  ? ? ?Infusions: ? sodium chloride Stopped (02/08/22 0826)  ? sodium chloride    ? sodium chloride 30 mL/hr at 02/10/22 0600  ? amiodarone    ? Followed by  ? amiodarone    ? famotidine (PEPCID) IV Stopped (02/07/22 1703)  ? furosemide (LASIX) 200 mg in dextrose 5% 100 mL ('2mg'$ /mL) infusion 10 mg/hr (02/10/22 0600)  ? lactated ringers    ? lactated ringers Stopped (02/09/22 1631)  ? norepinephrine (LEVOPHED) Adult infusion 8 mcg/min (02/10/22 0600)  ? promethazine (PHENERGAN) injection (IM or IVPB) Stopped (02/10/22 6553)  ? ? ?PRN Medications: ?sodium chloride, morphine injection, oxyCODONE, promethazine (PHENERGAN) injection (IM or IVPB), simethicone, sodium chloride flush, sodium chloride flush, traMADol ? ? ? ?Patient Profile  ? ?76 y/o with severe CAD/NSTEMI and iCM EF 30-35%. Developed refractory angina and shock yesterday with respiratory failure. Intubated and IABP  placed. Taken to OR 4/7 for CABG + MVR  ? ?Assessment/Plan  ? ?1. NSTEMI---> Cardiogenic Shock ?- HS Trop 748>2707> 6513 ?- Cath with 3V disease, elevated wedge,  and reduced cardiac output. IABP placed in lab with Norepi/Milrinone.  ?- 4/7 s/p CABG + MVR ?- Doing well No s/s angina ?- On ASA 325 ?- on statin  ?- With NSTEMI will need Plavix prior to d/c ?  ?2. Acute Hypoxemic Respiratory Failure ?- Extubated 4/8. ?- stable ? ?3. Acute HFrEF, ICM  ?- Echo EF 30-35% RV ok. Severe MR. EF improved on post CABG TEE ?- Now s/p CABG ?- on NE 6. Off Milrinone. IABP out ?- Co-ok 71%  ?- BP too soft for GDMT currently   ?- CVP 7, still 10 lb above pre-op. C/w lasix gtt at 10/hr + Diamox 250 mg (CO2 36),. Supp K  ? ?  ?4. Severe MR  ?- S/p MV replacement ?- Warfarin started per TCTS (will be on for 3 months) ?  ?5. Hypothyroidism  ?-On levothyroxine.  ?  ?6. AKI due to ATN/shock ?- resolved ?- monitor w/ diuresis  ? ?7. Acute blood loss anemia ?- transfuse as needed to keep hgb >= 8.0 ? ?8. Fever ?- suspect atx. Most lines out ?- now resolved  ?- Encourage IS ? ?7. Lethargy ?- suspect due to pain meds ?- improved today  ? ?8. B/l Pleural Effusions ?- moderate on CXR ?- diuresis per above ?- d/w Dr. Caffie Pinto, may need thoracentesis  ? ?9. Atrial Flutter ?- V-rates 90s-low 100s ?- start amio gtt  ?- on warfarin  ? ?Length of Stay: 7 ? ?Tonya Jester, PA-C  ?02/10/2022, 7:29 AM ? ?Advanced Heart Failure Team ?Pager [phone] (M-F; 7a - 5p)  ?Please contact [facility] for night-coverage after hours (5p -7a ) and weekends on [email or web address] ? ?Agree with above.  ? ?Diuresed well with IV lasix and metolazone. Weight down 9 pounds. Now off Bipap.  ? ?Denies CP or OSB. Went into AFL overnight. IV amio started  ? ?Remains on NE at 6. Co-ox ok.  ? ?General:  Weak appearing. No resp difficulty ?HEENT: normal ?Neck: supple. JVP 7 Carotids 2+ bilat; no bruits. No lymphadenopathy or thryomegaly appreciated. ?JYN:WGNFAOZ wound ok Irregular  rate & rhythm. No rubs, gallops or murmurs. ?Lungs: decreased at bases  ?Abdomen: soft, nontender, nondistended. No hepatosplenomegaly. No bruits or masses. Good bowel sounds. ?Extremities: no cyanosis, clubbing, rash, 1+ edema ?Neuro:

## 2022-02-10 NOTE — Progress Notes (Signed)
eLink Physician-Brief Progress Note ?Patient Name: LEIGHANNA KIRN ?DOB: Apr 26, 1946 ?MRN: [id number] ? ? ?Date of Service ? 02/10/2022  ?HPI/Events of Note ? ABG on 3 L/min [place] O2 = 7.557/42.7/103/38. ABG c/w metabolic alkalosis likely d/t diuresis with Lasix IV infusion. 2. Hypocalcemia - Ionized Ca++ = 1.06.  ?eICU Interventions ? Plan: ?Agree with Diamox ordered by cardiology. ?Will replace Ca++.  ? ? ? ?Intervention Category ?Major Interventions: Acid-Base disturbance - evaluation and management;Electrolyte abnormality - evaluation and management ? ?Giovonni Poirier Cornelia Copa ?02/10/2022, 3:21 AM ?

## 2022-02-10 NOTE — Progress Notes (Addendum)
Inpatient Rehab Admissions Coordinator:   Per therapy recommendations,  patient was screened for CIR candidacy by Mahkayla Preece, MS, CCC-SLP . At this time, Pt. Appears to be a a potential candidate for CIR. I will request   order for rehab consult per protocol for full assessment. Please contact me any with questions.  Romualdo Prosise, MS, CCC-SLP Rehab Admissions Coordinator  [phone] (celll) [phone] (office)  

## 2022-02-10 NOTE — Progress Notes (Signed)
0700 - Report received from [place], [place].  All questions answered.  All lines and drips verified.  Safety checks performed. ?Hand hygiene performed before/after each pt contact. ?Dr. Cyndia Bent rounded. ?0730 - Pt requesting bed pan.  Pt placed on bedpan.  [place], PA rounded. ?Pt w/small BM.  Peri and Foley care performed. ?0800 - Dr. Carlis Abbott rounded.  Pt to chair, per Dr. Ainsley Spinner request.  Pt assisted to chair with two person maximum assist.  Pt able to stand on her own, but legs were very weak. ?Assessment & Rx. ?0900 - A friend arrived to visit. ?1000 - Pt's husband arrived to visit. ?1045 - Pt vomited.  Phenergan ordered.  Pt's husband left the room to find me in another pt's room.  We discussed how that behavior was inappropriate and violates the other patient's privacy. ?Morning rounds. ?1100 - Pt bathed with soap and CHG.  Pt's gown and linens were changed.  Foley care performed.  Pt very weak on her feet.  Pt endorsed that she is still nauseated. ?1130 - Phenergan arrived and initiated.  Chrys Racer, PT arrived and working with pt. ?6381 - Dr. Carlis Abbott updated via secure chat. ?1230 - Pt sleepy after Phenergan, but not nauseated.  Pt's husband requested that only family and pastors be allowed to visit.  I suggested that he make a list so we could better manage that.  Pt's husband given paper on which to write the list.  Tamela Oddi, Secretary updated. ?1245 - List given to [facility]. ?1330 - Pt slumped in chair and advising that her back side hurts.  Pt repositioned with Sonia Baller, RN.  Pt assisted to standing, smear of stool noted.  Pt cleaned.  Bed pad changed.  Pt repositioned and endorsed feeling more comfortable. ?1500 - Pt reported to her husband that she had a BM.  Charis, OT advised that she was ready to work with pt and would assist with cleaning her.  Pt assisted to standing.  Pt had a smear on bed pad.  Pt cleaned.  Bed pad changed.  Charis, [place] worked with pt afterward. ?Dr. Cyndia Bent rounded.  We discussed pt's status.  He  advised that he would rather not give pt Phenergan during the day d/t sedating properties.  We discussed that phenergan was the only available option.  Orders updated. ?1630 - I spoke with lab about re-timing pt's BMP d/t IV K+ being administered.  BMP rescheduled for 20:00. ?1700 - Pt's dinner arrived.  Pt advised that she wasn't hungry and wouldn't eat it.  Dinner removed. ?1830 - Dr. Haroldine Laws rounded. ?1900 - Report given to [place], Therapist, sports.  All questions answered. ? ? ?

## 2022-02-10 NOTE — Progress Notes (Signed)
CT surgery PM rounds ? ?Patient sitting up in a chair feeling much better this evening ?Weaned off oxygen with O2 sats 95% ?Stable atrial flutter ? ?Blood pressure (!) 104/47, pulse 99, temperature 98.5 ?F (36.9 ?C), temperature source Oral, resp. rate 18, height '5\' 4"'$  (1.626 m), weight 62.7 kg, SpO2 95 %.  ?

## 2022-02-11 ENCOUNTER — Inpatient Hospital Stay (HOSPITAL_COMMUNITY): Payer: Medicare HMO

## 2022-02-11 DIAGNOSIS — I255 Ischemic cardiomyopathy: Secondary | ICD-10-CM | POA: Diagnosis not present

## 2022-02-11 DIAGNOSIS — I5021 Acute systolic (congestive) heart failure: Secondary | ICD-10-CM

## 2022-02-11 DIAGNOSIS — J90 Pleural effusion, not elsewhere classified: Secondary | ICD-10-CM | POA: Diagnosis not present

## 2022-02-11 DIAGNOSIS — J9601 Acute respiratory failure with hypoxia: Secondary | ICD-10-CM | POA: Diagnosis not present

## 2022-02-11 DIAGNOSIS — R570 Cardiogenic shock: Secondary | ICD-10-CM | POA: Diagnosis not present

## 2022-02-11 LAB — BASIC METABOLIC PANEL
Anion gap: 10 (ref 5–15)
Anion gap: 13 (ref 5–15)
BUN: 21 mg/dL (ref 8–23)
BUN: 21 mg/dL (ref 8–23)
CO2: 39 mmol/L — ABNORMAL HIGH (ref 22–32)
CO2: 38 mmol/L — ABNORMAL HIGH (ref 22–32)
Calcium: 8.2 mg/dL — ABNORMAL LOW (ref 8.9–10.3)
Calcium: 8.3 mg/dL — ABNORMAL LOW (ref 8.9–10.3)
Chloride: 86 mmol/L — ABNORMAL LOW (ref 98–111)
Chloride: 82 mmol/L — ABNORMAL LOW (ref 98–111)
Creatinine, Ser: 0.98 mg/dL (ref 0.44–1.00)
Creatinine, Ser: 1.01 mg/dL — ABNORMAL HIGH (ref 0.44–1.00)
GFR, Estimated: 60 mL/min — ABNORMAL LOW (ref >60)
GFR, Estimated: 58 mL/min — ABNORMAL LOW (ref >60)
Glucose, Bld: 155 mg/dL — ABNORMAL HIGH (ref 70–99)
Glucose, Bld: 167 mg/dL — ABNORMAL HIGH (ref 70–99)
Potassium: 3.1 mmol/L — ABNORMAL LOW (ref 3.5–5.1)
Potassium: 3.5 mmol/L (ref 3.5–5.1)
Sodium: 135 mmol/L (ref 135–145)
Sodium: 133 mmol/L — ABNORMAL LOW (ref 135–145)

## 2022-02-11 LAB — CBC
HCT: 28.8 % — ABNORMAL LOW (ref 36.0–46.0)
Hemoglobin: 9.4 g/dL — ABNORMAL LOW (ref 12.0–15.0)
MCH: 28.8 pg (ref 26.0–34.0)
MCHC: 32.6 g/dL (ref 30.0–36.0)
MCV: 88.3 fL (ref 80.0–100.0)
Platelets: 546 10*3/uL — ABNORMAL HIGH (ref 150–400)
RBC: 3.26 MIL/uL — ABNORMAL LOW (ref 3.87–5.11)
RDW: 14.6 % (ref 11.5–15.5)
WBC: 22.5 10*3/uL — ABNORMAL HIGH (ref 4.0–10.5)
nRBC: 0.1 % (ref 0.0–0.2)

## 2022-02-11 LAB — MAGNESIUM: Magnesium: 2 mg/dL (ref 1.7–2.4)

## 2022-02-11 LAB — COOXEMETRY PANEL
Carboxyhemoglobin: 1.9 % — ABNORMAL HIGH (ref 0.5–1.5)
Methemoglobin: <0.7 % (ref 0.0–1.5)
O2 Saturation: 57.1 %
Total hemoglobin: 10.4 g/dL — ABNORMAL LOW (ref 12.0–16.0)

## 2022-02-11 LAB — GLUCOSE, CAPILLARY
Glucose-Capillary: 114 mg/dL — ABNORMAL HIGH (ref 70–99)
Glucose-Capillary: 127 mg/dL — ABNORMAL HIGH (ref 70–99)
Glucose-Capillary: 133 mg/dL — ABNORMAL HIGH (ref 70–99)
Glucose-Capillary: 154 mg/dL — ABNORMAL HIGH (ref 70–99)
Glucose-Capillary: 167 mg/dL — ABNORMAL HIGH (ref 70–99)

## 2022-02-11 LAB — PROTIME-INR
INR: 1.3 — ABNORMAL HIGH (ref 0.8–1.2)
Prothrombin Time: 15.6 seconds — ABNORMAL HIGH (ref 11.4–15.2)

## 2022-02-11 LAB — ECHOCARDIOGRAM LIMITED
Height: 64 in
Weight: 2169.33 oz

## 2022-02-11 MED ORDER — POTASSIUM CHLORIDE 20 MEQ PO PACK
40.0000 meq | PACK | ORAL | Status: AC
  Administered 2022-02-11 (×2): 40 meq via ORAL
  Filled 2022-02-11 (×2): qty 2

## 2022-02-11 MED ORDER — POTASSIUM CHLORIDE CRYS ER 20 MEQ PO TBCR: 40.0000 meq | EXTENDED_RELEASE_TABLET | ORAL | Status: DC

## 2022-02-11 MED ORDER — TORSEMIDE 20 MG PO TABS
40.0000 mg | ORAL_TABLET | Freq: Every day | ORAL | Status: DC
  Administered 2022-02-11 – 2022-02-14 (×4): 40 mg via ORAL
  Filled 2022-02-11 (×4): qty 2

## 2022-02-11 MED ORDER — TORSEMIDE 20 MG PO TABS: 40.0000 mg | ORAL_TABLET | Freq: Every day | ORAL | Status: DC

## 2022-02-11 MED ORDER — PERFLUTREN LIPID MICROSPHERE
1.0000 mL | INTRAVENOUS | Status: AC | PRN
  Administered 2022-02-11: 3 mL via INTRAVENOUS
  Filled 2022-02-11: qty 10

## 2022-02-11 MED ORDER — POTASSIUM CHLORIDE 10 MEQ/50ML IV SOLN
10.0000 meq | INTRAVENOUS | Status: AC
  Administered 2022-02-11 (×3): 10 meq via INTRAVENOUS
  Filled 2022-02-11: qty 50

## 2022-02-11 MED ORDER — MIDODRINE HCL 5 MG PO TABS
5.0000 mg | ORAL_TABLET | Freq: Three times a day (TID) | ORAL | Status: DC
  Administered 2022-02-11 – 2022-02-14 (×9): 5 mg via ORAL
  Filled 2022-02-11 (×9): qty 1

## 2022-02-11 MED ORDER — GERHARDT'S BUTT CREAM
TOPICAL_CREAM | Freq: Every day | CUTANEOUS | Status: DC | PRN
  Administered 2022-02-13: 1 via TOPICAL
  Filled 2022-02-11 (×2): qty 1

## 2022-02-11 MED ORDER — WARFARIN SODIUM 2.5 MG PO TABS
2.5000 mg | ORAL_TABLET | Freq: Every day | ORAL | Status: AC
  Administered 2022-02-11: 2.5 mg via ORAL
  Filled 2022-02-11: qty 1

## 2022-02-11 MED FILL — Sodium Bicarbonate IV Soln 8.4%: INTRAVENOUS | Qty: 50 | Status: AC

## 2022-02-11 MED FILL — Heparin Sodium (Porcine) Inj 1000 Unit/ML: INTRAMUSCULAR | Qty: 10 | Status: AC

## 2022-02-11 MED FILL — Electrolyte-R (PH 7.4) Solution: INTRAVENOUS | Qty: 3000 | Status: AC

## 2022-02-11 MED FILL — Calcium Chloride Inj 10%: INTRAVENOUS | Qty: 10 | Status: AC

## 2022-02-11 MED FILL — Ampicillin Sodium For Inj 500 MG: INTRAMUSCULAR | Qty: 2 | Status: AC

## 2022-02-11 NOTE — Progress Notes (Signed)
Patient states she will wear the [place] tonight that she does not like the BiPAP. Vitals are stable and patient is in no distress at this time.  ?

## 2022-02-11 NOTE — Progress Notes (Addendum)
?  ? ? Advanced Heart Failure Rounding Note ? ?PCP-Cardiologist: None  ? ?Subjective:   ? ?76 y/o with severe CAD/NSTEMI and iCM EF 30-35%. Developed refractory angina and shock with respiratory failure. Intubated and IABP placed.  ?4/7 CABG + MVR  ?4/8 Extubated, IABP removed  ?04/11 AFL >>converted shortly after starting amio gtt ? ?POD # 5 ? ?NE down to 3 yesterday, increased to 10 overnight d/t low MAPs. NE down to 8 this am. SBP 100s-110s (occasionally 32I) but diastolic pressures consistently 40s. Off milrinone.  ? ?Continues on lasix gtt at 10/hr. Received diamox 250 mg X 1. 3.5L UOP yesterday.  CVP 3-4. Scr stable 0.98, CO2 39, K 3.1. Weight down another 3 lb. ? ?WBC 23>>20>>22K. Afebrile.  ?Hgb stable, 9.4. ?INR 1.3. ? ?? Rhythm sinus 90s-100s with 1st degree AVB ? ?CXR this am with persistent b/l pleural effusions ? ?Feels okay this am. No CP or dyspnea. No pain. RN reports poor po intake.  ? ?Objective:   ?Weight Range: ?61.5 kg ?Body mass index is 23.27 kg/m?.  ? ?Vital Signs:   ?Temp:  [97.5 ?F (36.4 ?C)-98.7 ?F (37.1 ?C)] 98.7 ?F (37.1 ?C) (04/11 2300) ?Pulse Rate:  [92-109] 96 (04/12 0700) ?Resp:  [15-27] 15 (04/12 0700) ?BP: (95-120)/(43-74) 101/44 (04/12 0700) ?SpO2:  [85 %-99 %] 99 % (04/12 0700) ?Weight:  [61.5 kg] 61.5 kg (04/12 0100) ?Last BM Date : 02/10/22 ? ?Weight change: ?Filed Weights  ? 02/09/22 0500 02/10/22 0700 02/11/22 0100  ?Weight: 66.7 kg 62.7 kg 61.5 kg  ? ? ?Intake/Output:  ? ?Intake/Output Summary (Last 24 hours) at 02/11/2022 0730 ?Last data filed at 02/11/2022 0700 ?Gross per 24 hour  ?Intake 2500.62 ml  ?Output 3461 ml  ?Net -960.38 ml  ?  ? ? ?Physical Exam  ?CVP 3-4 ?General:  Weak appearing. Sitting up in bed. ?HEENT: normal ?Neck: supple. no JVD. Carotids 2+ bilat; no bruits. + R IJ introducer ?Cor: PMI nondisplaced. Regular rate & rhythm. Sternum stable. o rubs, gallops or murmurs. ?Lungs: clear ?Abdomen: soft, nontender, nondistended.  ?Extremities: no cyanosis,  clubbing, rash, edema ?Neuro: alert & orientedx3, cranial nerves grossly intact. moves all 4 extremities w/o difficulty. Affect pleasant ? ? ? ?Telemetry  ? ?Sinus 90s - 100s, 1st degree AVB ? ?Labs  ?  ?CBC ?Recent Labs  ?  02/10/22 ?0127 02/10/22 ?0204 02/11/22 ?0304  ?WBC 20.4*  --  22.5*  ?NEUTROABS 16.6*  --   --   ?HGB 9.2* 9.9* 9.4*  ?HCT 29.2* 29.0* 28.8*  ?MCV 89.8  --  88.3  ?PLT 371  --  546*  ? ?Basic Metabolic Panel ?Recent Labs  ?  02/10/22 ?0127 02/10/22 ?0204 02/10/22 ?2000 02/11/22 ?0304  ?NA 139   < > 134* 135  ?K 3.2*   < > 4.0 3.1*  ?CL 94*   < > 87* 86*  ?CO2 35*   < > 36* 39*  ?GLUCOSE 135*   < > 131* 155*  ?BUN 18   < > 23 21  ?CREATININE 0.95   < > 1.00 0.98  ?CALCIUM 8.3*   < > 8.2* 8.2*  ?MG 2.1  --   --  2.0  ? < > = values in this interval not displayed.  ? ?Liver Function Tests ?Recent Labs  ?  02/10/22 ?0636 02/10/22 ?2000  ?AST 67* 53*  ?ALT 95* 84*  ?ALKPHOS 135* 130*  ?BILITOT 0.8 0.9  ?PROT 5.5* 5.6*  ?ALBUMIN 2.6* 2.7*  ? ?No  results for input(s): LIPASE, AMYLASE in the last 72 hours. ?Cardiac Enzymes ?No results for input(s): CKTOTAL, CKMB, CKMBINDEX, TROPONINI in the last 72 hours. ? ?BNP: ?BNP (last 3 results) ?Recent Labs  ?  02/02/22 ?2321  ?BNP 366.4*  ? ? ?ProBNP (last 3 results) ?No results for input(s): PROBNP in the last 8760 hours. ? ? ?D-Dimer ?No results for input(s): DDIMER in the last 72 hours. ?Hemoglobin A1C ?No results for input(s): HGBA1C in the last 72 hours. ? ?Fasting Lipid Panel ?No results for input(s): CHOL, HDL, LDLCALC, TRIG, CHOLHDL, LDLDIRECT in the last 72 hours. ? ?Thyroid Function Tests ?No results for input(s): TSH, T4TOTAL, T3FREE, THYROIDAB in the last 72 hours. ? ?Invalid input(s): FREET3 ? ? ?Other results: ? ? ?Imaging  ? ? ?DG CHEST PORT 1 VIEW ? ?Result Date: 02/11/2022 ?CLINICAL DATA:  History of CABG.  Mitral valve replacement. EXAM: PORTABLE CHEST 1 VIEW COMPARISON:  February 10, 2022. FINDINGS: Similar bilateral pleural effusions. Overlying  bibasilar opacities, also similar. Right-sided skin fold without definite pneumothorax. Right-sided IJ sheath in place with tip projecting at the mid SVC. Cardiomediastinal silhouette is similar with postsurgical changes of CABG and mitral valve replacement. Median sternotomy. IMPRESSION: Similar layering bilateral pleural effusions with overlying atelectasis or consolidation. Electronically Signed   By: Margaretha Sheffield M.D.   On: 02/11/2022 06:55   ? ? ?Medications:   ? ? ?Scheduled Medications: ? acetaminophen  1,000 mg Oral Q6H  ? aspirin EC  81 mg Oral Daily  ? bisacodyl  10 mg Oral Daily  ? Or  ? bisacodyl  10 mg Rectal Daily  ? Chlorhexidine Gluconate Cloth  6 each Topical Daily  ? docusate sodium  200 mg Oral Daily  ? enoxaparin (LOVENOX) injection  40 mg Subcutaneous QHS  ? feeding supplement (GLUCERNA SHAKE)  237 mL Oral BID BM  ? insulin aspart  0-15 Units Subcutaneous TID WC  ? insulin detemir  10 Units Subcutaneous Daily  ? levothyroxine  25 mcg Oral Q0600  ? mouth rinse  15 mL Mouth Rinse BID  ? melatonin  3 mg Oral QHS  ? metoCLOPramide (REGLAN) injection  10 mg Intravenous Q6H  ? multivitamin with minerals  1 tablet Oral Daily  ? pantoprazole  40 mg Oral Daily  ? Ensure Max Protein  11 oz Oral QHS  ? rosuvastatin  5 mg Oral Daily  ? sodium chloride flush  10-40 mL Intracatheter Q12H  ? sodium chloride flush  3 mL Intravenous Q12H  ? Warfarin - Physician Dosing Inpatient   Does not apply q1600  ? ? ?Infusions: ? sodium chloride Stopped (02/08/22 0826)  ? sodium chloride    ? sodium chloride Stopped (02/11/22 4650)  ? amiodarone 30 mg/hr (02/11/22 0700)  ? famotidine (PEPCID) IV Stopped (02/07/22 1703)  ? furosemide (LASIX) 200 mg in dextrose 5% 100 mL ('2mg'$ /mL) infusion 10 mg/hr (02/11/22 0700)  ? lactated ringers    ? lactated ringers Stopped (02/09/22 1631)  ? norepinephrine (LEVOPHED) Adult infusion 7 mcg/min (02/11/22 0700)  ? ? ?PRN Medications: ?sodium chloride, morphine injection, ondansetron  (ZOFRAN) IV, oxyCODONE, simethicone, sodium chloride flush, sodium chloride flush, traMADol ? ? ? ?Patient Profile  ? ?76 y/o with severe CAD/NSTEMI and iCM EF 30-35%. Developed refractory angina and shock yesterday with respiratory failure. Intubated and IABP placed. Taken to OR 4/7 for CABG + MVR  ? ?Assessment/Plan  ? ?1. NSTEMI---> Cardiogenic Shock ?- HS Trop 354>6568> 6513 ?- Cath with 3V disease, elevated wedge,  and reduced cardiac output. IABP placed in lab with Norepi/Milrinone.  ?- 4/7 s/p CABG + MVR ?- Doing well. ?- On ASA 325. Would typically give plavix post NSTEMI, but also on warfarin with recent MVR. ?- on statin  ?  ?2. Acute Hypoxemic Respiratory Failure ?- Extubated 4/8. ?- stable ? ?3. Acute HFrEF, ICM  ?- Echo EF 30-35% RV ok. Severe MR. EF improved on post CABG TEE ?- Now s/p CABG ?- on NE 8, wean today as able. Off Milrinone. IABP out ?- Will check Co-ox. ?- SBP 100s-110s, occasionally 90s. Low diastolic pressure contributing to low MAPs overnight ?- CVP 3-4. Stop lasix gtt. Has diuresed 12 lb. Supp K today.  ? ?4. Severe MR  ?- S/p MV replacement with 25 mm Mitris Resilia pericardial valve ?- Warfarin started per TCTS (will be on for 3 months) ?- INR 1.3. ?  ?5. Hypothyroidism  ?-On levothyroxine.  ?  ?6. AKI due to ATN/shock ?- resolved ?- monitor w/ diuresis  ? ?7. Acute blood loss anemia ?- transfuse as needed to keep hgb >= 8.0 ?- Hgb stable today ? ?8. Fever ?- suspect atx. Most lines out ?- now resolved  ?- Encourage IS ? ?7. Lethargy ?- suspect due to pain meds ?- improved today  ? ?8. B/l Pleural Effusions ?- moderate on CXR ?- diuresis per above ?- may need thoracentesis  ? ?9. Atrial Flutter ?- On amio gtt at 30/hr switch to po tomorrow if maintaining SR ?- SR 90s - 100s with 1st degree AVB today ?- on warfarin, INR 1.3. ? ?Length of Stay: 8 ? ?FINCH, LINDSAY N, PA-C  ?02/11/2022, 7:30 AM ? ?Advanced Heart Failure Team ?Pager [phone] (M-F; 7a - 5p)  ?Please contact [facility]  for night-coverage after hours (5p -7a ) and weekends on [email or web address] ? ?Agree with above.  ? ?Remains on NE 7 mostly due to low diastolics. In NSR on IV amio. Lasix gtrt stopped this am due to CVP 3-4. Feels weak

## 2022-02-11 NOTE — Progress Notes (Signed)
? ?NAME:  Tonya Hamilton, MRN:  [id number], DOB:  [birth year], LOS: 8 ?ADMISSION DATE:  02/02/2022, CONSULTATION DATE:  02/03/22 ?REFERRING MD:  Christy Gentles CHIEF COMPLAINT:  Dyspnea  ? ?History of Present Illness:  ?Tonya Hamilton is a 76 y.o. female who has a PMH as outlined below.  She presented to [facility] ED 4/3 with dyspnea.  She states that symptoms began suddenly and progressed.  She also endorsed cough intimately productive of sputum.  No fevers/chills/sweats, chest pain, N/V/D, abdominal pain, myalgias, exposures known sick contacts, recent travel. ? ?In ED, she was found to be hypoxic.  CXR showed bilateral opacities and bilateral mild edema.  Troponin was elevated at 708 with repeat up to 1454.  BNP mildly elevated at 366.  EKG with ST depressions and global ischemia.  Leukocytosis to 23.6.  She was initially placed on Ventimask but was later upgraded to BiPAP.  She continued to require BiPAP which she did feel subjective improvement in symptoms.  Patient stable subsequently called for transfer to ICU at [facility] for further evaluation management. ? ?Pertinent  Medical History:  ?has History of colonic diverticulitis; Osteoarthritis; Hypertension; Hyperlipidemia; Hypothyroid; GERD (gastroesophageal reflux disease); Osteopenia; Anxiety; Osteoarthritis of right hip; Angioedema; Chronic intractable headache; Occipital neuralgia of left side; Neck pain; Hyperreflexia; Arthritis, multiple joint involvement; Spasmodic dysphonia; Acute respiratory failure ([place]); Respiratory failure ([place]); Community acquired pneumonia; Non-STEMI (non-ST elevated myocardial infarction) ([place]); Cardiogenic shock ([place]); Severe mitral regurgitation; Ischemic cardiomyopathy; Multiple vessel coronary artery disease; and S/P CABG x 4 on their problem list. ? ?Significant Hospital Events: ?Including procedures, antibiotic start and stop dates in addition to other pertinent events   ?4/4 admit, started on Cefepime and Azith and Bipap.   Trop rising, on heparin gtt,  Echo with reduced EF and regional wall motion abnormalities with severe MR, cardiology consulted and taken to cath lab which revealed severe LM and 3V disease.  IABP placed and intubated, in cardiogenic shock ?4/6 Lying in bed on IABP with progressive stability seen, plan for CABG with MVR tomorrow  ?4/7 OR to CABG and MVR, no issues overnight  ?4/8 Extubated and IABP removed.  Chest tubes removed ?4/9 up to chair. ?4/10 improved UOP with metolazone and increased lasix ? ? ?Interim History / Subjective:  ?Appetite improving, feeling well. She remains on amiodarone, NE, lasix gtt. Afebrile overnight. ? ?Objective:  ?Blood pressure (!) 101/44, pulse 96, temperature 98.5 ?F (36.9 ?C), temperature source Oral, resp. rate 15, height '5\' 4"'$  (1.626 m), weight 61.5 kg, SpO2 99 %. ?CVP:  [4 mmHg-6 mmHg] 5 mmHg  ?   ? ?Intake/Output Summary (Last 24 hours) at 02/11/2022 0851 ?Last data filed at 02/11/2022 0700 ?Gross per 24 hour  ?Intake 2377.28 ml  ?Output 2636 ml  ?Net -258.72 ml  ? ? ?Filed Weights  ? 02/09/22 0500 02/10/22 0700 02/11/22 0100  ?Weight: 66.7 kg 62.7 kg 61.5 kg  ? ?Physical Exam  ?General: ill appearing woman sitting up in the recliner ?HEENT: [place]/AT, eyes anicteric ?Neuro: more alert and awake today, moving all extremities. Walked with PT. ?CV: S1S2, RRR ?PULM: Reduced basilar breath sounds without rhales, no conversational dyspnea. CTAB anteriorly.  ?GI: soft, NT  ?Extremities: mild peripheral edema improving, no cyanosis ?Skin: Sternal incision healing well, no erythema. No diffuse rashes. ? ?Coox 57% ?BUN 39 ?Cr 0.98 ?WBC 22.5 ?H/H 9.4/28.8 ? ?CXR personally reviewed> improving effusions, still silhouetted R hemidiaphragm ? ?Assessment & Plan:  ? ?Acute mixed cardiogenic and distributive shock following cardiac surgery  requiring titration of norepinephrine, milrinone  ?Hypervolemia improving ?Coronary artery disease status post CABG ?Severe ischemic MR status post MVR ?Atrial  flutter ?- Post-op care per TCTS ?- Con't pain control per protocol; has had hypoventilation with opiates, so agree with focusing on non-opiate and non-pharmacological pain control measures ?- Con't NE; adding midodrine today ?- Daily coox ?- Appreciate Cardiology's management. ?- Con't diuresis, switching to torsemide. ?- coumadin x 3 months, pharmacy to dose ?-con't amiodarone; hopefully can come off when able to stop inotropes ? ?Acute respiratory failure with hypoxia and hypercapnia; hypercapnia now resolved.  Hypercapnia was likely due to side effect of opiate pain control. ?Acute pulmonary edema, improving ?Enlarging bilateral pleural effusions due to hypervolemia, improving ?- BiPAP PRN ?-supplmental O2 to maintain SpO2 >90%  ?- pulmonary hygiene ?- OOB mobility ?-con't daily CXR; no plans for thora unless it fails to resolve with diuresis ? ?Hypothyroidism ?-Con't synthroid ? ?Hypertension ?Dyslipidemia ?-Con't trial of Crestor; previously intolerant to simvastatin due to myalgias. ?-con't daily aspirin ? ?Hyperglycemia, pre-diabetes ?- con't detemir 10 units daily ?-con't SSI PRN ?-goal BG <180 ?- Needs outpatient follow-up for prediabetes. ? ?Insomnia  ?--melatonin QHS ?-sleep hygiene ? ?Nausea, vomiting ?-reglan ?-zofran PRN ? ?Deconditioning ?- PT, OT. Con't efforts to stay OOB during the day to improve mobility ? ?No family at bedside this morning. ? ?Best practice (evaluated daily):  ?Diet/type: Regular consistency (see orders) ?DVT prophylaxis: Prophylactic Lovenox. ?GI prophylaxis: H2B ?Lines: Right IJ introducer, arterial line. ?Foley:  Yes, and it is still needed ?Code Status:  full code ?Last date of multidisciplinary goals of care discussion: Family updated at bedside 4/10 ? ?This patient is critically ill with multiple organ system failure which requires frequent high complexity decision making, assessment, support, evaluation, and titration of therapies. This was completed through the  application of advanced monitoring technologies and extensive interpretation of multiple databases. During this encounter critical care time was devoted to patient care services described in this note for 34 minutes. ? ?Julian Hy, DO 02/11/22 11:04 AM ?[facility] ? ?

## 2022-02-11 NOTE — Progress Notes (Deleted)
Pt states he can place the CPAP on himself. Will call if help is needed. ?

## 2022-02-11 NOTE — Progress Notes (Signed)
0700 - Received report from [place], Therapist, sports.  All questions answered.  Safety checks performed.  All lines and drips verified. ?Hand hygiene performed before/after each pt contact. ?[place], HF PA rounded.  Lasix D/C'd. ?3614 - [place], EKG tech arrived to perform an EKG. ?0800 - Assessment/Rx. ?[place], PT worked with pt.  Pt able to walk to the door. ?29 - Pt's daughter, Threasa Beards, called for an update. ?4315 - Dr. Carlis Abbott rounded. ?[place], pt's husband, arrived. ?1045 - Morning rounds. ?1100 - E-link called to make video chat appointment with Threasa Beards, daughter, at 1400. ?[place], Rehab RN rounded. ?1200 - Pt incontinent of stool.  Pt bathed with soap and CHG.  Peri care and Foley care performed.  Pt endorsed skin tenderness in peri-anal area.  [place]'s butt cream ordered. ?ECHO sonographers arrived; advised they would come back after lunch and requested pt be transferred to bed. ?1300 - Pt with substantial BM.  Pt cleaned.  Peri care and Foley care performed.  Pt's gown and linens changed.  Pt's chair, floor, and bedside table cleaned as well.  Pt assisted to bed. ?[place], sonographer and Kermit Balo, sonographer arrived to do an ECHO on pt. ?1400 - Pt's daughter Threasa Beards video chatted with pt.  Harrie Jeans reported that they would like for their son to do a Dietitian tomorrow.  We discussed that he would need to let the day shift nurse know tomorrow. ?1700 - Pt assisted to chair for dinner.  [place] arrived. ?1800 - [place] requesting to add another person to the list. ?1900 - Report given to Cristie Hem, Therapist, sports.  All questions answered. ?

## 2022-02-11 NOTE — Progress Notes (Signed)
Late entry note for Bonneau, OT. Pulled through by Aldona Bar, OT on 4/12 please see note for further details. ? ? ? 02/10/22 1600  ?OT Visit Information  ?Last OT Received On 02/10/22  ?Assistance Needed +2  ?History of Present Illness 77 y.o. F who presents 4/3 with dyspnea. Echo with reduced EF and regional wall motion abnormalities with severe MR, cardiology consulted and taken to cath lab which revealed severe LM and 3V disease.  IABP placed and intubated, in cardiogenic shock. S/p CABG and MVR 4/7. Significant PMH: diverticulitis, OA, HTN, HLD, osteopenia, anxiety.  ?Precautions  ?Precautions Sternal;Fall  ?Precaution Booklet Issued No  ?Precaution Comments verbally reviewed with pt husband  ?Restrictions  ?Other Position/Activity Restrictions sternal precautions  ?Home Living  ?Family/patient expects to be discharged to: Private residence  ?Living Arrangements Spouse/significant other  ?Available Help at Discharge Family  ?Type of Home House  ?Home Access Stairs to enter  ?Entrance Stairs-Number of Steps 1  ?Home Layout Able to live on main level with bedroom/bathroom  ?Bathroom Shower/Tub Walk-in shower  ?Bathroom Toilet Handicapped height  ?[place] - single point  ?Prior Function  ?Prior Level of Function  Independent/Modified Independent  ?Communication  ?Communication No difficulties  ?Pain Assessment  ?Pain Assessment Faces  ?Faces Pain Scale 2  ?Pain Location incisional  ?Pain Descriptors / Indicators Guarding  ?Pain Intervention(s) Monitored during session;Limited activity within patient's tolerance;Repositioned  ?Cognition  ?Arousal/Alertness Lethargic  ?Behavior During Therapy [facility] for tasks assessed/performed  ?Overall Cognitive Status Impaired/Different from baseline  ?Area of Impairment Attention;Memory;Following commands;Awareness;Safety/judgement;Problem solving;Orientation  ?Orientation Level Time ?("2016")  ?Current Attention Level Sustained  ?Memory Decreased short-term  memory;Decreased recall of precautions  ?Following Commands Follows one step commands inconsistently  ?Safety/Judgement Decreased awareness of safety;Decreased awareness of deficits  ?Awareness Intellectual  ?Problem Solving Decreased initiation;Requires verbal cues;Slow processing  ?General Comments Difficulty maintaining arousal. Very motivated and requiting increased cues to maintain arousal and attention.  ?Upper Extremity Assessment  ?Upper Extremity Assessment Generalized weakness;RUE deficits/detail;LUE deficits/detail  ?RUE Deficits / Details Decreased grasp strength. Able to perform forward flexion to shoulder, elbow ROM, and finger oppsition  ?LUE Deficits / Details Decreased grasp strength. Able to perform forward flexion to shoulder, elbow ROM, and finger oppsition  ?Lower Extremity Assessment  ?Lower Extremity Assessment Defer to PT evaluation  ?RLE Deficits / Details Grossly 3-/5  ?LLE Deficits / Details Grossly 3-/5  ?ADL  ?Overall ADL's  Needs assistance/impaired  ?Eating/Feeding Minimal assistance;Sitting  ?Eating/Feeding Details (indicate cue type and reason) While seated in recliner, pt able to bring dinner roll to her mouth with BUEs  ?Grooming Minimal assistance;Sitting  ?Upper Body Bathing Maximal assistance;Sitting  ?Lower Body Bathing Maximal assistance;Sit to/from stand  ?Upper Body Dressing  Maximal assistance;Sitting  ?Lower Body Dressing Maximal assistance;Sit to/from stand  ?Toilet Transfer Moderate assistance ?(simualted to recliner)  ?Toileting- Clothing Manipulation and Hygiene Maximal assistance;+2 for safety/equipment;Sit to/from stand  ?Toileting - Clothing Manipulation Details (indicate cue type and reason) Max A for peri care with second person to assist in maintaining standing  ?Functional mobility during ADLs Moderate assistance ?(sit<>stand at reclienr)  ?General ADL Comments Pt presenting with decreased arousal and strength. very motivated despite this  ?Bed Mobility   ?General bed mobility comments OOB in chair  ?Transfers  ?Overall transfer level Needs assistance  ?Equipment used Rolling Mule (2 wheels)  ?Transfers Sit to/from Stand  ?Sit to Stand Mod assist  ?General transfer comment Min A for power up into standing and then Mod A  for correcting posterior lean. performing sit<>stand x2  ?Balance  ?Overall balance assessment Needs assistance  ?Sitting-balance support Feet supported  ?Sitting balance-Leahy Scale Poor  ?Sitting balance - Comments needs assist when trunk is unsupported  ?Standing balance support Bilateral upper extremity supported;Reliant on assistive device for balance  ?Standing balance-Leahy Scale Poor  ?Standing balance comment reliant on RW  ?General Comments  ?General comments (skin integrity, edema, etc.) VSS. husband present  ?Exercises  ?Exercises General Upper Extremity  ?General Exercises - Upper Extremity  ?Shoulder Flexion AROM;Both;5 reps;Seated ?(to 90*)  ?OT - End of Session  ?Equipment Utilized During Treatment Oxygen  ?Activity Tolerance Patient tolerated treatment well  ?Patient left in chair;with call bell/phone within reach;with family/visitor present  ?Nurse Communication Mobility status  ?OT Assessment  ?OT Recommendation/Assessment Patient needs continued OT Services  ?OT Visit Diagnosis Unsteadiness on feet (R26.81);Other abnormalities of gait and mobility (R26.89);Muscle weakness (generalized) (M62.81)  ?OT Problem List Decreased strength;Decreased range of motion;Decreased activity tolerance;Impaired balance (sitting and/or standing);Decreased knowledge of precautions;Decreased knowledge of use of DME or AE  ?OT Plan  ?OT Frequency (ACUTE ONLY) Min 2X/week  ?OT Treatment/Interventions (ACUTE ONLY) Self-care/ADL training;Therapeutic exercise;Energy conservation;DME and/or AE instruction;Therapeutic activities;Patient/family education  ?AM-PAC OT "6 Clicks" Daily Activity Outcome Measure (Version 2)  ?Help from another person eating  meals? 3  ?Help from another person taking care of personal grooming? 2  ?Help from another person toileting, which includes using toliet, bedpan, or urinal? 2  ?Help from another person bathing (including washing, rinsing, drying)? 2  ?Help from another person to put on and taking off regular upper body clothing? 2  ?Help from another person to put on and taking off regular lower body clothing? 2  ?6 Click Score 13  ?Progressive Mobility  ?What is the highest level of mobility based on the progressive mobility assessment? Level 3 (Stands with assist) - Balance while standing  and cannot march in place  ?Activity Stood at bedside  ?OT Recommendation  ?Follow Up Recommendations Acute inpatient rehab (3hours/day)  ?Assistance recommended at discharge Frequent or constant Supervision/Assistance  ?Patient can return home with the following A lot of help with walking and/or transfers;A lot of help with bathing/dressing/bathroom  ?Functional Status Assessent Patient has had a recent decline in their functional status and demonstrates the ability to make significant improvements in function in a reasonable and predictable amount of time.  ?OT Equipment BSC/3in1  ?Individuals Consulted  ?Consulted and Agree with Results and Recommendations Patient;Family member/caregiver  ?Family Member Consulted husband  ?Acute Rehab OT Goals  ?Patient Stated Goal Get stronger and go home  ?OT Goal Formulation With patient/family  ?Time For Goal Achievement 02/24/22  ?Potential to Achieve Goals Good  ?OT Time Calculation  ?OT Start Time (ACUTE ONLY) 1457  ?OT Stop Time (ACUTE ONLY) 1520  ?OT Time Calculation (min) 23 min  ?OT General Charges  ?$OT Visit 1 Visit  ?OT Evaluation  ?$OT Eval Moderate Complexity 1 Mod  ?OT Treatments  ?$Self Care/Home Management  8-22 mins  ?Written Expression  ?Dominant Hand Right  ? ?

## 2022-02-11 NOTE — Progress Notes (Signed)
5 Days Post-Op Procedure(s) (LRB): ?CORONARY ARTERY BYPASS GRAFTING X4, Using Left and Right Greater Saphenous Vein Harvested Endoscopically (N/A) ?MITRAL VALVE (MV) REPLACEMENT Using 25MM Mitris Valve (N/A) ?TRANSESOPHAGEAL ECHOCARDIOGRAM (TEE) (N/A) ?Subjective: ?No complaints this morning. Working with PT currently ? ?Objective: ?Vital signs in last 24 hours: ?Temp:  [97.5 ?F (36.4 ?C)-98.7 ?F (37.1 ?C)] 98.5 ?F (36.9 ?C) (04/12 0740) ?Pulse Rate:  [93-109] 96 (04/12 0700) ?Resp:  [15-27] 15 (04/12 0700) ?BP: (95-120)/(43-74) 101/44 (04/12 0700) ?SpO2:  [85 %-99 %] 99 % (04/12 0700) ?Weight:  [61.5 kg] 61.5 kg (04/12 0100) ? ?Hemodynamic parameters for last 24 hours: ?CVP:  [4 mmHg-6 mmHg] 5 mmHg ? ?Intake/Output from previous day: ?04/11 0701 - 04/12 0700 ?In: 2500.6 [P.O.:360; I.V.:1622.5; IV Piggyback:518.1] ?Out: 8338 [Urine:3460; Stool:1] ?Intake/Output this shift: ?No intake/output data recorded. ? ?General appearance: alert, cooperative, and no distress ?Neurologic: intact ?Heart: mildly tachy, regular ?Lungs: diminished breath sounds bibasilar ?Wound: clean and dry ? ?Lab Results: ?Recent Labs  ?  02/10/22 ?0127 02/10/22 ?2505 02/11/22 ?0304  ?WBC 20.4*  --  22.5*  ?HGB 9.2* 9.9* 9.4*  ?HCT 29.2* 29.0* 28.8*  ?PLT 371  --  546*  ? ?BMET:  ?Recent Labs  ?  02/10/22 ?2000 02/11/22 ?0304  ?NA 134* 135  ?K 4.0 3.1*  ?CL 87* 86*  ?CO2 36* 39*  ?GLUCOSE 131* 155*  ?BUN 23 21  ?CREATININE 1.00 0.98  ?CALCIUM 8.2* 8.2*  ?  ?PT/INR:  ?Recent Labs  ?  02/11/22 ?0304  ?LABPROT 15.6*  ?INR 1.3*  ? ?ABG ?   ?Component Value Date/Time  ? PHART 7.557 (H) 02/10/2022 3976  ? HCO3 38.0 (H) 02/10/2022 7341  ? TCO2 39 (H) 02/10/2022 9379  ? ACIDBASEDEF 3.0 (H) 02/07/2022 1010  ? O2SAT 57.1 02/11/2022 0835  ? ?CBG (last 3)  ?Recent Labs  ?  02/10/22 ?1206 02/10/22 ?1546 02/11/22 ?0736  ?GLUCAP 159* 153* 154*  ? ? ?Assessment/Plan: ?S/P Procedure(s) (LRB): ?CORONARY ARTERY BYPASS GRAFTING X4, Using Left and Right Greater  Saphenous Vein Harvested Endoscopically (N/A) ?MITRAL VALVE (MV) REPLACEMENT Using 25MM Mitris Valve (N/A) ?TRANSESOPHAGEAL ECHOCARDIOGRAM (TEE) (N/A) ? ?NEURO- intact ?CV- in sinus tach on amiodarone drip ? Co ox 57 on norepi ? Coumadin - INR 1.3 this AM + baby aspirin ?RESP-  on 3L [place] ? CXR shows moderate right, small left effusions ?RENAL- creatinine normal ? Diuresed well. Lasix drip stopped this AM ? Hypokalemia- supplement K ?ENDO- CBG mildly elevated, continue SSI ?Anemia secondary to ABL- stable, monitor ?GI- tolerating PO ?Deconditioning- PT/ OT ?Afebrile last 24 hours, WBC elevated, monitor ? LOS: 8 days  ? ? ?Melrose Nakayama ?02/11/2022 ? ? ?

## 2022-02-11 NOTE — Progress Notes (Signed)
Physical Therapy Treatment ?Patient Details ?Name: Tonya Hamilton ?MRN: [id number] ?DOB: 1946/10/26 ?Today's Date: 02/11/2022 ? ? ?History of Present Illness Pt is a 76 y.o. F who presents 4/3 with dyspnea. Echo with reduced EF and regional wall motion abnormalities with severe MR, cardiology consulted and taken to cath lab which revealed severe LM and 3V disease.  IABP placed and intubated, in cardiogenic shock. S/p CABG and MVR 4/7. Significant PMH: diverticulitis, OA, HTN, HLD, osteopenia, anxiety. ? ?  ?PT Comments  ? ? Pt progressing towards her physical therapy goals; improved alertness this session and able to grip utensils and eat cereal with set up assist. Pt performed seated warm up exercises and ambulating 30 ft with a [place], min assist and close chair follow. SpO2 90% on RA at rest; requiring 4L O2 during ambulation. Continues with significant BUE weakness, decreased coordination, impaired sitting/standing balance, decreased activity tolerance, and cognitive impairments. Suspect good progress in light of PLOF, motivation and family support. Continue to recommend AIR at d/c.  ? ?Vitals: ?Sitting pre walk: 93/40 (57) ?Sitting post walk: 96/39 (54) ?Sitting post walk x 3 minutes: 102/56 (67)    ?Recommendations for follow up therapy are one component of a multi-disciplinary discharge planning process, led by the attending physician.  Recommendations may be updated based on patient status, additional functional criteria and insurance authorization. ? ?Follow Up Recommendations ? Acute inpatient rehab (3hours/day) ?  ?  ?Assistance Recommended at Discharge Frequent or constant Supervision/Assistance  ?Patient can return home with the following A little help with walking and/or transfers;A little help with bathing/dressing/bathroom;Assist for transportation;Help with stairs or ramp for entrance;Assistance with cooking/housework ?  ?Equipment Recommendations ? Rolling [place] (2 wheels);BSC/3in1  ?   ?Recommendations for Other Services Rehab consult ? ? ?  ?Precautions / Restrictions Precautions ?Precautions: Sternal;Fall;Other (comment) ?Precaution Booklet Issued: No ?Precaution Comments: Watch BP, O2 ?Restrictions ?Weight Bearing Restrictions: Yes ?Other Position/Activity Restrictions: sternal precautions  ?  ? ?Mobility ? Bed Mobility ?  ?  ?  ?  ?  ?  ?  ?General bed mobility comments: OOB in chair ?  ? ?Transfers ?Overall transfer level: Needs assistance ?Equipment used: Rolling [place] (2 wheels) ?Transfers: Sit to/from Stand ?Sit to Stand: Min assist, +2 safety/equipment ?  ?  ?  ?  ?  ?General transfer comment: Pt holding onto pillow, cues for rocking forward to gain momentum, minA to power up to standing position. Pt with decreased eccentric control standing > sitting ?  ? ?Ambulation/Gait ?Ambulation/Gait assistance: Min assist, +2 safety/equipment ?Gait Distance (Feet): 30 Feet ?Assistive device: Rolling [place] (2 wheels) ?Gait Pattern/deviations: Step-through pattern, Decreased stride length, Narrow base of support ?Gait velocity: decreased ?Gait velocity interpretation: <1.8 ft/sec, indicate of risk for recurrent falls ?  ?General Gait Details: cues for larger step lengths, wider BOS, minA for balance and close chair follow utilized ? ? ?Stairs ?  ?  ?  ?  ?  ? ? ?Wheelchair Mobility ?  ? ?Modified Rankin (Stroke Patients Only) ?  ? ? ?  ?Balance Overall balance assessment: Needs assistance ?Sitting-balance support: Feet supported ?Sitting balance-Leahy Scale: Poor ?Sitting balance - Comments: needs assist when trunk is unsupported ?  ?Standing balance support: Bilateral upper extremity supported, Reliant on assistive device for balance ?Standing balance-Leahy Scale: Poor ?Standing balance comment: reliant on RW ?  ?  ?  ?  ?  ?  ?  ?  ?  ?  ?  ?  ? ?  ?Cognition Arousal/Alertness:  Awake/alert ?Behavior During Therapy: [facility] for tasks assessed/performed ?Overall Cognitive Status: Impaired/Different  from baseline ?Area of Impairment: Attention, Memory, Following commands, Awareness, Safety/judgement, Problem solving ?  ?  ?  ?  ?  ?  ?  ?  ?  ?Current Attention Level: Sustained ?Memory: Decreased short-term memory, Decreased recall of precautions ?Following Commands: Follows one step commands inconsistently ?Safety/Judgement: Decreased awareness of safety, Decreased awareness of deficits ?Awareness: Intellectual ?Problem Solving: Decreased initiation, Requires verbal cues, Slow processing ?General Comments: Pt husband reports cognition is not at baseline (typically very sharp and independent). More alert this session, smiling, slow processing time. ?  ?  ? ?  ?Exercises General Exercises - Upper Extremity ?Shoulder Flexion: Both, 10 reps, Seated, AROM (to 90 deg) ?Elbow Flexion: AROM, Both, 15 reps, Seated ?General Exercises - Lower Extremity ?Ankle Circles/Pumps: Both, Seated, 15 reps ?Long Arc Quad: Both, 10 reps, Seated ?Hip Flexion/Marching: Both, 10 reps, Seated ?Other Exercises ?Other Exercises: Seated: scapular retractions x 10, cervical rotation to R/L x 5 each ? ?  ?General Comments   ?  ?  ? ?Pertinent Vitals/Pain Pain Assessment ?Pain Assessment: No/denies pain  ? ? ?Home Living   ?  ?  ?  ?  ?  ?  ?  ?  ?  ?   ?  ?Prior Function    ?  ?  ?   ? ?PT Goals (current goals can now be found in the care plan section) Acute Rehab PT Goals ?Patient Stated Goal: pt husband agreeable to rehab ?PT Goal Formulation: With patient/family ?Time For Goal Achievement: 02/24/22 ?Potential to Achieve Goals: Good ?Progress towards PT goals: Progressing toward goals ? ?  ?Frequency ? ? ? Min 3X/week ? ? ? ?  ?PT Plan Current plan remains appropriate  ? ? ?Co-evaluation   ?  ?  ?  ?  ? ?  ?AM-PAC PT "6 Clicks" Mobility   ?Outcome Measure ? Help needed turning from your back to your side while in a flat bed without using bedrails?: A Lot ?Help needed moving from lying on your back to sitting on the side of a flat bed  without using bedrails?: A Lot ?Help needed moving to and from a bed to a chair (including a wheelchair)?: A Little ?Help needed standing up from a chair using your arms (e.g., wheelchair or bedside chair)?: A Little ?Help needed to walk in hospital room?: A Little ?Help needed climbing 3-5 steps with a railing? : Total ?6 Click Score: 14 ? ?  ?End of Session Equipment Utilized During Treatment: Oxygen ?Activity Tolerance: Patient tolerated treatment well ?Patient left: in chair;with call bell/phone within reach;with family/visitor present ?Nurse Communication: Mobility status ?PT Visit Diagnosis: Unsteadiness on feet (R26.81);Muscle weakness (generalized) (M62.81);Difficulty in walking, not elsewhere classified (R26.2) ?  ? ? ?Time: 9485-4627 ?PT Time Calculation (min) (ACUTE ONLY): 40 min ? ?Charges:  $Gait Training: 8-22 mins ?$Therapeutic Exercise: 8-22 mins ?$Therapeutic Activity: 8-22 mins          ?          ? ?Wyona Almas, PT, DPT ?Acute Rehabilitation Services ?Pager [phone] ?Office [phone] ? ? ? ?Tonya Hamilton ?02/11/2022, 10:56 AM ? ?

## 2022-02-11 NOTE — Progress Notes (Addendum)
Inpatient Rehabilitation Admissions Coordinator  ? ?I met with patient and her spouse at bedside for rehab assessment. We discussed goals and expectations of a possible Cir admit. They prefer Cir then home. I await further medical progress before beginning Auth with [facility]. I will follow up tomorrow. ? ?Danne Baxter, RN, MSN ?Rehab Admissions Coordinator ?[phone] ?02/11/2022 11:30 AM ? ?

## 2022-02-11 NOTE — Progress Notes (Signed)
? ?   ?  [address] ?      [place] [address] ?            [phone]   ? ?  ?Resting in bed ? ?BP (!) 118/49   Pulse (!) 104   Temp 97.8 ?F (36.6 ?C) (Oral)   Resp 17   Ht '5\' 4"'$  (1.626 m)   Wt 61.5 kg   SpO2 93%   BMI 23.27 kg/m?  ?Norepi @ 12 ? ?Intake/Output Summary (Last 24 hours) at 02/11/2022 1803 ?Last data filed at 02/11/2022 1600 ?Gross per 24 hour  ?Intake 2410.5 ml  ?Output 1780 ml  ?Net 630.5 ml  ? ?Co-ox 57 earlier today ? ?Revonda Standard Roxan Hockey, MD ?[facility] ?[phone] ? ?

## 2022-02-12 ENCOUNTER — Inpatient Hospital Stay (HOSPITAL_COMMUNITY): Payer: Medicare HMO

## 2022-02-12 ENCOUNTER — Inpatient Hospital Stay: Payer: Self-pay

## 2022-02-12 DIAGNOSIS — J90 Pleural effusion, not elsewhere classified: Secondary | ICD-10-CM | POA: Diagnosis not present

## 2022-02-12 DIAGNOSIS — J9601 Acute respiratory failure with hypoxia: Secondary | ICD-10-CM | POA: Diagnosis not present

## 2022-02-12 DIAGNOSIS — E876 Hypokalemia: Secondary | ICD-10-CM | POA: Diagnosis not present

## 2022-02-12 DIAGNOSIS — R112 Nausea with vomiting, unspecified: Secondary | ICD-10-CM

## 2022-02-12 DIAGNOSIS — R570 Cardiogenic shock: Secondary | ICD-10-CM | POA: Diagnosis not present

## 2022-02-12 LAB — BASIC METABOLIC PANEL
Anion gap: 12 (ref 5–15)
BUN: 19 mg/dL (ref 8–23)
CO2: 35 mmol/L — ABNORMAL HIGH (ref 22–32)
Calcium: 8.2 mg/dL — ABNORMAL LOW (ref 8.9–10.3)
Chloride: 87 mmol/L — ABNORMAL LOW (ref 98–111)
Creatinine, Ser: 0.95 mg/dL (ref 0.44–1.00)
GFR, Estimated: >60 mL/min (ref >60)
Glucose, Bld: 145 mg/dL — ABNORMAL HIGH (ref 70–99)
Potassium: 4.4 mmol/L (ref 3.5–5.1)
Sodium: 134 mmol/L — ABNORMAL LOW (ref 135–145)

## 2022-02-12 LAB — BASIC METABOLIC PANEL WITH GFR
Anion gap: 11 (ref 5–15)
BUN: 22 mg/dL (ref 8–23)
CO2: 37 mmol/L — ABNORMAL HIGH (ref 22–32)
Calcium: 8 mg/dL — ABNORMAL LOW (ref 8.9–10.3)
Chloride: 84 mmol/L — ABNORMAL LOW (ref 98–111)
Creatinine, Ser: 0.92 mg/dL (ref 0.44–1.00)
GFR, Estimated: >60 mL/min
Glucose, Bld: 180 mg/dL — ABNORMAL HIGH (ref 70–99)
Potassium: 3 mmol/L — ABNORMAL LOW (ref 3.5–5.1)
Sodium: 132 mmol/L — ABNORMAL LOW (ref 135–145)

## 2022-02-12 LAB — GLUCOSE, CAPILLARY
Glucose-Capillary: 153 mg/dL — ABNORMAL HIGH (ref 70–99)
Glucose-Capillary: 144 mg/dL — ABNORMAL HIGH (ref 70–99)
Glucose-Capillary: 144 mg/dL — ABNORMAL HIGH (ref 70–99)
Glucose-Capillary: 133 mg/dL — ABNORMAL HIGH (ref 70–99)
Glucose-Capillary: 128 mg/dL — ABNORMAL HIGH (ref 70–99)
Glucose-Capillary: 133 mg/dL — ABNORMAL HIGH (ref 70–99)

## 2022-02-12 LAB — CBC
HCT: 28.6 % — ABNORMAL LOW (ref 36.0–46.0)
Hemoglobin: 9.4 g/dL — ABNORMAL LOW (ref 12.0–15.0)
MCH: 28.6 pg (ref 26.0–34.0)
MCHC: 32.9 g/dL (ref 30.0–36.0)
MCV: 86.9 fL (ref 80.0–100.0)
Platelets: 602 10*3/uL — ABNORMAL HIGH (ref 150–400)
RBC: 3.29 MIL/uL — ABNORMAL LOW (ref 3.87–5.11)
RDW: 14.7 % (ref 11.5–15.5)
WBC: 18.1 10*3/uL — ABNORMAL HIGH (ref 4.0–10.5)
nRBC: 0.1 % (ref 0.0–0.2)

## 2022-02-12 LAB — COOXEMETRY PANEL
Carboxyhemoglobin: 2.4 % — ABNORMAL HIGH (ref 0.5–1.5)
Methemoglobin: <0.7 % (ref 0.0–1.5)
O2 Saturation: 67.1 %
Total hemoglobin: 9.6 g/dL — ABNORMAL LOW (ref 12.0–16.0)

## 2022-02-12 LAB — PROTIME-INR
INR: 1.4 — ABNORMAL HIGH (ref 0.8–1.2)
Prothrombin Time: 17.1 seconds — ABNORMAL HIGH (ref 11.4–15.2)

## 2022-02-12 LAB — MAGNESIUM: Magnesium: 2.2 mg/dL (ref 1.7–2.4)

## 2022-02-12 LAB — PROCALCITONIN: Procalcitonin: 0.31 ng/mL

## 2022-02-12 MED ORDER — AMIODARONE HCL 200 MG PO TABS
200.0000 mg | ORAL_TABLET | Freq: Two times a day (BID) | ORAL | Status: DC
  Administered 2022-02-12 – 2022-02-19 (×15): 200 mg via ORAL
  Filled 2022-02-12 (×15): qty 1

## 2022-02-12 MED ORDER — SCOPOLAMINE 1 MG/3DAYS TD PT72
1.0000 | MEDICATED_PATCH | TRANSDERMAL | Status: DC
  Administered 2022-02-12 – 2022-02-15 (×2): 1.5 mg via TRANSDERMAL
  Filled 2022-02-12 (×2): qty 1

## 2022-02-12 MED ORDER — SODIUM CHLORIDE 0.9% FLUSH: 10.0000 mL | INTRAVENOUS | Status: DC | PRN

## 2022-02-12 MED ORDER — ENSURE ENLIVE PO LIQD
237.0000 mL | Freq: Two times a day (BID) | ORAL | Status: DC
  Administered 2022-02-12 – 2022-02-16 (×10): 237 mL via ORAL

## 2022-02-12 MED ORDER — POTASSIUM CHLORIDE 10 MEQ/50ML IV SOLN: 10.0000 meq | INTRAVENOUS | Status: DC

## 2022-02-12 MED ORDER — PROMETHAZINE HCL 6.25 MG/5ML PO SYRP
12.5000 mg | ORAL_SOLUTION | Freq: Four times a day (QID) | ORAL | Status: DC | PRN
  Filled 2022-02-12: qty 10

## 2022-02-12 MED ORDER — PROCHLORPERAZINE EDISYLATE 10 MG/2ML IJ SOLN
10.0000 mg | Freq: Once | INTRAMUSCULAR | Status: AC
  Administered 2022-02-12: 10 mg via INTRAVENOUS
  Filled 2022-02-12: qty 2

## 2022-02-12 MED ORDER — ALBUMIN HUMAN 25 % IV SOLN
25.0000 g | Freq: Four times a day (QID) | INTRAVENOUS | Status: AC
  Administered 2022-02-12 – 2022-02-13 (×2): 25 g via INTRAVENOUS
  Filled 2022-02-12 (×2): qty 100

## 2022-02-12 MED ORDER — METOCLOPRAMIDE HCL 5 MG/ML IJ SOLN
10.0000 mg | Freq: Four times a day (QID) | INTRAMUSCULAR | Status: AC
  Administered 2022-02-12 – 2022-02-13 (×4): 10 mg via INTRAVENOUS
  Filled 2022-02-12 (×4): qty 2

## 2022-02-12 MED ORDER — WARFARIN SODIUM 2 MG PO TABS
4.0000 mg | ORAL_TABLET | Freq: Every day | ORAL | Status: AC
  Administered 2022-02-12: 4 mg via ORAL
  Filled 2022-02-12: qty 2

## 2022-02-12 MED ORDER — POTASSIUM CHLORIDE 10 MEQ/50ML IV SOLN
10.0000 meq | INTRAVENOUS | Status: AC
  Administered 2022-02-12 (×3): 10 meq via INTRAVENOUS
  Filled 2022-02-12 (×2): qty 50

## 2022-02-12 MED ORDER — POTASSIUM CHLORIDE 10 MEQ/50ML IV SOLN
10.0000 meq | INTRAVENOUS | Status: AC
  Administered 2022-02-12 (×4): 10 meq via INTRAVENOUS
  Filled 2022-02-12: qty 50

## 2022-02-12 MED ORDER — SODIUM CHLORIDE 0.9% FLUSH
10.0000 mL | Freq: Two times a day (BID) | INTRAVENOUS | Status: DC
  Administered 2022-02-12 – 2022-02-17 (×9): 10 mL

## 2022-02-12 MED ORDER — POTASSIUM CHLORIDE 20 MEQ PO PACK
40.0000 meq | PACK | ORAL | Status: DC
  Administered 2022-02-12: 40 meq via ORAL
  Filled 2022-02-12: qty 2

## 2022-02-12 NOTE — Plan of Care (Signed)

## 2022-02-12 NOTE — Progress Notes (Signed)
Inpatient Rehabilitation Admissions Coordinator  ? ?I will begin Auth with [facility] for possible [facility] admit. ? ?Danne Baxter, RN, MSN ?Rehab Admissions Coordinator ?[phone] ?02/12/2022 12:12 PM ? ?

## 2022-02-12 NOTE — Progress Notes (Signed)
Epicardial pacing wire removed at 0900. Procedure explain to patient and vitals taken q15 minutes post procedure.  ?

## 2022-02-12 NOTE — Progress Notes (Signed)
Peripherally Inserted Central Catheter Placement ? ?The IV Nurse has discussed with the patient and/or persons authorized to consent for the patient, the purpose of this procedure and the potential benefits and risks involved with this procedure.  The benefits include less needle sticks, lab draws from the catheter, and the patient may be discharged home with the catheter. Risks include, but not limited to, infection, bleeding, blood clot (thrombus formation), and puncture of an artery; nerve damage and irregular heartbeat and possibility to perform a PICC exchange if needed/ordered by physician.  Alternatives to this procedure were also discussed.  Bard Power PICC patient education guide, fact sheet on infection prevention and patient information card has been provided to patient /or left at bedside.   ? ?PICC Placement Documentation  ?PICC Double Lumen 00/92/33 Left Basilic 44 cm 0 cm (Active)  ?Indication for Insertion or Continuance of Line Vasoactive infusions 02/12/22 1655  ?Exposed Catheter (cm) 0 cm 02/12/22 1655  ?Site Assessment Clean, Dry, Intact 02/12/22 1655  ?Lumen #1 Status Flushed;Saline locked;Blood return noted 02/12/22 1655  ?Lumen #2 Status Flushed;Saline locked;Blood return noted 02/12/22 1655  ?Dressing Type Securing device;Transparent 02/12/22 1655  ?Dressing Status Antimicrobial disc in place 02/12/22 1655  ?Dressing Intervention New dressing;Other (Comment) 02/12/22 1655  ?Dressing Change Due 02/19/22 02/12/22 1655  ? ? ? ? ? ?Tonya Hamilton ?02/12/2022, 4:56 PM ? ?

## 2022-02-12 NOTE — Progress Notes (Signed)
CT surgery PM rounds ? ?Patient had another good day ?Appetite is improved, p.o. intake improved ?Off all drips and ambulating in hallway ?Sinus rhythm ?Coumadin 4 mg for bioprosthetic mitral valve ? ?Blood pressure (!) 89/43, pulse 99, temperature 98.2 ?F (36.8 ?C), temperature source Oral, resp. rate 17, height '5\' 4"'$  (1.626 m), weight 58.9 kg, SpO2 94 %.  ?

## 2022-02-12 NOTE — Progress Notes (Signed)
Occupational Therapy Treatment ?Patient Details ?Name: Tonya Hamilton ?MRN: [id number] ?DOB: Aug 31, 1946 ?Today's Date: 02/12/2022 ? ? ?History of present illness 76 y.o. F who presents 4/3 with dyspnea. Echo with reduced EF and regional wall motion abnormalities with severe MR, cardiology consulted and taken to cath lab which revealed severe LM and 3V disease.  IABP placed and intubated, in cardiogenic shock. S/p CABG and MVR 4/7. Significant PMH: diverticulitis, OA, HTN, HLD, osteopenia, anxiety. ?  ?OT comments ? Pt progressing towards established OT goals. Continues to present with decreased balance and activity tolerance impacting her functional performance. Pt requiring Mod A for LB dressing and would benefit from AE education to maintain precautions. Pt performing functional mobility with Min-Mod A for functional mobility. Due to pts' high motivation, good family support, and PLOF, continue to recommend dc to AIR and will continue to follow acutely as admitted.   ? ?Recommendations for follow up therapy are one component of a multi-disciplinary discharge planning process, led by the attending physician.  Recommendations may be updated based on patient status, additional functional criteria and insurance authorization. ?   ?Follow Up Recommendations ? Acute inpatient rehab (3hours/day)  ?  ?Assistance Recommended at Discharge Frequent or constant Supervision/Assistance  ?Patient can return home with the following ? A lot of help with walking and/or transfers;A lot of help with bathing/dressing/bathroom ?  ?Equipment Recommendations ? BSC/3in1  ?  ?Recommendations for Other Services   ? ?  ?Precautions / Restrictions Precautions ?Precautions: Sternal;Fall;Other (comment) ?Precaution Booklet Issued: No ?Precaution Comments: Watch BP, O2 ?Restrictions ?Other Position/Activity Restrictions: sternal precautions  ? ? ?  ? ?Mobility Bed Mobility ?  ?  ?  ?  ?  ?  ?  ?General bed mobility comments: OOB in chair ?   ? ?Transfers ?Overall transfer level: Needs assistance ?Equipment used: Rolling [place] (2 wheels) ?Transfers: Sit to/from Stand ?Sit to Stand: Min assist ?  ?  ?  ?  ?  ?General transfer comment: Min A for weight shift forward and correcting posterior lean ?  ?  ?Balance Overall balance assessment: Needs assistance ?Sitting-balance support: Feet supported ?Sitting balance-Leahy Scale: Fair ?  ?  ?Standing balance support: Bilateral upper extremity supported, Reliant on assistive device for balance ?Standing balance-Leahy Scale: Poor ?Standing balance comment: reliant on RW ?  ?  ?  ?  ?  ?  ?  ?  ?  ?  ?  ?   ? ?ADL either performed or assessed with clinical judgement  ? ?ADL Overall ADL's : Needs assistance/impaired ?  ?  ?  ?  ?  ?  ?  ?  ?  ?  ?Lower Body Dressing: Moderate assistance;Sit to/from stand ?Lower Body Dressing Details (indicate cue type and reason): Unable to perform figure four and requiring Mod A for intiating donning of underwear. Once in standing, pt requiring Min A-Mod A for posterior lean and then able to pull underwear over hips with cues for compensatory techniques ?  ?  ?Toileting- Clothing Manipulation and Hygiene: Maximal assistance;+2 for safety/equipment;Sit to/from stand ?Toileting - Clothing Manipulation Details (indicate cue type and reason): Max A for peri care with second person to assist in maintaining standing ?  ?  ?Functional mobility during ADLs: Moderate assistance (sit<>stand at reclienr) ?General ADL Comments: Pt presenting with decreased arousal and strength. very motivated despite this ?  ? ?Extremity/Trunk Assessment Upper Extremity Assessment ?Upper Extremity Assessment: Generalized weakness ?  ?Lower Extremity Assessment ?Lower Extremity Assessment: Defer to PT  evaluation ?RLE Deficits / Details: Grossly 3-/5 ?LLE Deficits / Details: Grossly 3-/5 ?  ?  ?  ? ?Vision   ?  ?  ?Perception   ?  ?Praxis   ?  ? ?Cognition Arousal/Alertness: Awake/alert ?Behavior During  Therapy: [facility] for tasks assessed/performed ?Overall Cognitive Status: Impaired/Different from baseline ?Area of Impairment: Following commands, Awareness, Safety/judgement, Problem solving, Memory ?  ?  ?  ?  ?  ?  ?  ?  ?  ?  ?Memory: Decreased recall of precautions ?Following Commands: Follows multi-step commands inconsistently, Follows multi-step commands consistently ?Safety/Judgement: Decreased awareness of safety, Decreased awareness of deficits ?Awareness: Emergent ?Problem Solving: Requires verbal cues, Slow processing ?General Comments: Pt demonstrating increased arousal and awareness. Able to participate and following simple commands. Not recalling sternal precautions. ?  ?  ?   ?Exercises General Exercises - Upper Extremity ?Shoulder Flexion:  (to 90 deg) ? ?  ?Shoulder Instructions   ? ? ?  ?General Comments VSS on RA. Husband present  ? ? ?Pertinent Vitals/ Pain       Pain Assessment ?Pain Assessment: Faces ?Faces Pain Scale: Hurts a little bit ?Pain Location: incisional ?Pain Descriptors / Indicators: Guarding ?Pain Intervention(s): Monitored during session, Repositioned ? ?Home Living   ?  ?  ?  ?  ?  ?  ?  ?  ?  ?  ?  ?  ?  ?  ?  ?  ?  ?  ? ?  ?Prior Functioning/Environment    ?  ?  ?  ?   ? ?Frequency ? Min 2X/week  ? ? ? ? ?  ?Progress Toward Goals ? ?OT Goals(current goals can now be found in the care plan section) ? Progress towards OT goals: Progressing toward goals ? ?Acute Rehab OT Goals ?OT Goal Formulation: With patient/family ?Time For Goal Achievement: 02/24/22 ?Potential to Achieve Goals: Good ?ADL Goals ?Pt Will Perform Upper Body Dressing: with min assist;sitting ?Pt Will Perform Lower Body Dressing: with min assist;with adaptive equipment;with caregiver independent in assisting;sit to/from stand ?Pt Will Transfer to Toilet: with min assist;bedside commode;stand pivot transfer ?Pt Will Perform Toileting - Clothing Manipulation and hygiene: with min assist;with caregiver independent in  assisting;with adaptive equipment;sitting/lateral leans;sit to/from stand ?Additional ADL Goal #1: Pt will demonstrate selective attention to ADLs with Min cues  ?Plan Discharge plan remains appropriate   ? ?Co-evaluation ? ? ?   ?  ?  ?  ?  ? ?  ?AM-PAC OT "6 Clicks" Daily Activity     ?Outcome Measure ? ? Help from another person eating meals?: A Little ?Help from another person taking care of personal grooming?: A Lot ?Help from another person toileting, which includes using toliet, bedpan, or urinal?: A Lot ?Help from another person bathing (including washing, rinsing, drying)?: A Lot ?Help from another person to put on and taking off regular upper body clothing?: A Lot ?Help from another person to put on and taking off regular lower body clothing?: A Lot ?6 Click Score: 13 ? ?  ?End of Session Equipment Utilized During Treatment: Rolling [place] (2 wheels) ? ?OT Visit Diagnosis: Unsteadiness on feet (R26.81);Other abnormalities of gait and mobility (R26.89);Muscle weakness (generalized) (M62.81) ?  ?Activity Tolerance Patient tolerated treatment well ?  ?Patient Left in chair;with call bell/phone within reach;with family/visitor present ?  ?Nurse Communication Mobility status ?  ? ?   ? ?Time: 8841-6606 ?OT Time Calculation (min): 40 min ? ?Charges: OT General Charges ?$OT Visit:  1 Visit ?OT Treatments ?$Self Care/Home Management : 38-52 mins ? ?Leen Tworek MSOT, OTR/L ?Acute Rehab ?Pager: [phone] ?Office: [phone] ? ?Vardaan Depascale M Sanae Willetts ?02/12/2022, 5:29 PM ? ? ?

## 2022-02-12 NOTE — Progress Notes (Addendum)
?  ? ? Advanced Heart Failure Rounding Note ? ?PCP-Cardiologist: None  ? ?Subjective:   ? ?76 y/o with severe CAD/NSTEMI and iCM EF 30-35%. Developed refractory angina and shock with respiratory failure. Intubated and IABP placed.  ?4/7 CABG + MVR  ?4/8 Extubated, IABP removed  ?04/11 AFL >>converted shortly after starting amio gtt ? ?POD # 6 ? ?NE up to 10 overnight, down to 3 this am. Co-ox 67%. ? ?Low diastolic pressures contributing to low MAPs. Now on midodrine to help with NE wean.  ? ?CVP 8. On Po torsemide 40 daily. ? ?Maintaining SR. ? ?Scr stable at 0.9, K 3.0 ? ?No dyspnea, orthopnea or PND. Intermittent nausea noted. No significant pain. ? ?Limited echo 04/12: ?EF 35-40%, RMA, RV okay, mean gradient 7 across mitral valve prosthesis, large density in LA cavity that likely represents artifact but cannot rule out thrombus ? ? ?Objective:   ?Weight Range: ?58.9 kg ?Body mass index is 22.29 kg/m?.  ? ?Vital Signs:   ?Temp:  [97.8 ?F (36.6 ?C)-98.5 ?F (36.9 ?C)] 98.2 ?F (36.8 ?C) (04/13 0645) ?Pulse Rate:  [95-197] 97 (04/13 0700) ?Resp:  [5-24] 17 (04/13 0700) ?BP: (71-123)/(36-83) 96/82 (04/13 0700) ?SpO2:  [82 %-100 %] 97 % (04/13 0700) ?Weight:  [58.9 kg] 58.9 kg (04/13 8315) ?Last BM Date : 02/11/22 ? ?Weight change: ?Filed Weights  ? 02/10/22 0700 02/11/22 0100 02/12/22 0637  ?Weight: 62.7 kg 61.5 kg 58.9 kg  ? ? ?Intake/Output:  ? ?Intake/Output Summary (Last 24 hours) at 02/12/2022 0732 ?Last data filed at 02/12/2022 0600 ?Gross per 24 hour  ?Intake 2413.66 ml  ?Output 2160 ml  ?Net 253.66 ml  ?  ? ? ?Physical Exam  ?CVP 8 ?General:  Sitting up in chair. No distress. ?HEENT: normal ?Neck: supple. Jvp 7-8 cm. Carotids 2+ bilat; no bruits.  ?Cor: PMI nondisplaced. Regular rate & rhythm. No rubs, gallops or murmurs. Sternum stable. ?Lungs: clear ?Abdomen: soft, nontender, nondistended.  ?Extremities: no cyanosis, clubbing, rash, edema ?Neuro: alert & orientedx3, cranial nerves grossly intact. moves all 4  extremities w/o difficulty. Affect pleasant ? ? ? ? ?Telemetry  ? ?SR 90s-100s ? ?Labs  ?  ?CBC ?Recent Labs  ?  02/10/22 ?0127 02/10/22 ?0204 02/11/22 ?0304 02/12/22 ?1761  ?WBC 20.4*  --  22.5* 18.1*  ?NEUTROABS 16.6*  --   --   --   ?HGB 9.2*   < > 9.4* 9.4*  ?HCT 29.2*   < > 28.8* 28.6*  ?MCV 89.8  --  88.3 86.9  ?PLT 371  --  546* 602*  ? < > = values in this interval not displayed.  ? ?Basic Metabolic Panel ?Recent Labs  ?  02/11/22 ?0304 02/11/22 ?6073 02/12/22 ?7106  ?NA 135 133* 132*  ?K 3.1* 3.5 3.0*  ?CL 86* 82* 84*  ?CO2 39* 38* 37*  ?GLUCOSE 155* 167* 180*  ?BUN '21 21 22  '$ ?CREATININE 0.98 1.01* 0.92  ?CALCIUM 8.2* 8.3* 8.0*  ?MG 2.0  --  2.2  ? ?Liver Function Tests ?Recent Labs  ?  02/10/22 ?0636 02/10/22 ?2000  ?AST 67* 53*  ?ALT 95* 84*  ?ALKPHOS 135* 130*  ?BILITOT 0.8 0.9  ?PROT 5.5* 5.6*  ?ALBUMIN 2.6* 2.7*  ? ?No results for input(s): LIPASE, AMYLASE in the last 72 hours. ?Cardiac Enzymes ?No results for input(s): CKTOTAL, CKMB, CKMBINDEX, TROPONINI in the last 72 hours. ? ?BNP: ?BNP (last 3 results) ?Recent Labs  ?  02/02/22 ?2321  ?BNP 366.4*  ? ? ?  ProBNP (last 3 results) ?No results for input(s): PROBNP in the last 8760 hours. ? ? ?D-Dimer ?No results for input(s): DDIMER in the last 72 hours. ?Hemoglobin A1C ?No results for input(s): HGBA1C in the last 72 hours. ? ?Fasting Lipid Panel ?No results for input(s): CHOL, HDL, LDLCALC, TRIG, CHOLHDL, LDLDIRECT in the last 72 hours. ? ?Thyroid Function Tests ?No results for input(s): TSH, T4TOTAL, T3FREE, THYROIDAB in the last 72 hours. ? ?Invalid input(s): FREET3 ? ? ?Other results: ? ? ?Imaging  ? ? ?DG Chest Port 1 View ? ?Result Date: 02/12/2022 ?CLINICAL DATA:  Status post CABG. EXAM: PORTABLE CHEST 1 VIEW COMPARISON:  02/11/2022 FINDINGS: 0515 hours. Cardiopericardial silhouette is at upper limits of normal for size. Right IJ sheath remains in place. Bibasilar collapse/consolidation with small bilateral pleural effusions, stable. No overt  airspace pulmonary edema. Telemetry leads overlie the chest. IMPRESSION: 1. Stable exam. Bibasilar collapse/consolidation with small bilateral pleural effusions. 2. No pneumothorax. Electronically Signed   By: Misty Stanley M.D.   On: 02/12/2022 06:59  ? ?ECHOCARDIOGRAM LIMITED ? ?Result Date: 02/11/2022 ?   ECHOCARDIOGRAM LIMITED REPORT   Patient Name:   Tonya Hamilton Date of Exam: 02/11/2022 Medical Rec #:  [id number]        Height:       64.0 in Accession #:    [id number]       Weight:       135.6 lb Date of Birth:  14-Nov-1945        BSA:          1.658 m? Patient Age:    31 years         BP:           92/40 mmHg Patient Gender: F                HR:           99 bpm. Exam Location:  Inpatient Procedure: Intracardiac Opacification Agent, Limited Echo, Color Doppler and            Cardiac Doppler Indications:    N46.27 Acute systolic (congestive) heart failure  History:        Patient has prior history of Echocardiogram examinations, most                 recent 02/06/2022. CAD; Risk Factors:Hypertension and                 Dyslipidemia. 43m Edwards Mitris Resilia Implanted 02/06/22.  Sonographer:    ERaquel SarnaSenior RDCS Referring Phys: [id number] LINDSAY NICOLE FCentral Oklahoma Ambulatory Surgical Center IncIMPRESSIONS  1. Left ventricular ejection fraction, by estimation, is 35 to 40%. The left ventricle has moderately decreased function. The left ventricle demonstrates regional wall motion abnormalities (see scoring diagram/findings for description). There is hypokinesis of the left ventricular, entire lateral wall and inferolateral wall. There is hypokinesis of the left ventricular, mid anteroseptal wall. There is hypokinesis of the left ventricular, apical anterior wall.  2. Right ventricular systolic function is normal. The right ventricular size is normal. There is normal pulmonary artery systolic pressure.  3. The mitral valve has been repaired/replaced. There is a 25 mm Edwards Mitris Resilia pericardial MVR present in the mitral position.     No evidence of  mitral valve regurgitation. The mean mitral valve gradient is 7.0 mmHg.  4. The aortic valve was not well visualized. Aortic valve regurgitation is not visualized. Aortic valve sclerosis is present, with no evidence of aortic valve stenosis.  5.  The inferior vena cava is normal in size with greater than 50% respiratory variability, suggesting right atrial pressure of 3 mmHg.  6. The pericardial effusion is circumferential.  7. There is a Large density in the LA cavity that likely represents artifact but cannot rule out thrombus. Recommend repeat limited echo with definity contrast for further evaulation or could consider Cardiac CTA. This was not present on prior echo. FINDINGS  Left Ventricle: Left ventricular ejection fraction, by estimation, is 35 to 40%. The left ventricle has moderately decreased function. The left ventricle demonstrates regional wall motion abnormalities. The left ventricular internal cavity size was normal in size. There is no left ventricular hypertrophy. Right Ventricle: The right ventricular size is normal. No increase in right ventricular wall thickness. Right ventricular systolic function is normal. There is normal pulmonary artery systolic pressure. The tricuspid regurgitant velocity is 2.13 m/s, and  with an assumed right atrial pressure of 8 mmHg, the estimated right ventricular systolic pressure is 92.4 mmHg. Left Atrium: Left atrial size was Large density in the LA that likely represents artifact but unclear etiology. Right Atrium: Right atrial size was normal in size. Pericardium: Trivial pericardial effusion is present. The pericardial effusion is circumferential. Mitral Valve: The mitral valve has been repaired/replaced. There is a 25 mm Edwards Mitris Resilia pericardial MVR present in the mitral position. MV peak gradient, 18.3 mmHg. The mean mitral valve gradient is 7.0 mmHg. Tricuspid Valve: The tricuspid valve is normal in structure. Tricuspid valve regurgitation is mild .  No evidence of tricuspid stenosis. Aortic Valve: The aortic valve was not well visualized. Aortic valve regurgitation is not visualized. Aortic valve sclerosis is present, with no evidence of aortic valve ste

## 2022-02-12 NOTE — Progress Notes (Signed)
Patient refuses BiPAP for the night. ?

## 2022-02-12 NOTE — Progress Notes (Signed)
? ?NAME:  Tonya Hamilton, MRN:  [id number], DOB:  04/26/1946, LOS: 9 ?ADMISSION DATE:  02/02/2022, CONSULTATION DATE:  02/03/22 ?REFERRING MD:  Christy Gentles CHIEF COMPLAINT:  Dyspnea  ? ?History of Present Illness:  ?Tonya Hamilton is a 76 y.o. female who has a PMH as outlined below.  She presented to [facility] ED 4/3 with dyspnea.  She states that symptoms began suddenly and progressed.  She also endorsed cough intimately productive of sputum.  No fevers/chills/sweats, chest pain, N/V/D, abdominal pain, myalgias, exposures known sick contacts, recent travel. ? ?In ED, she was found to be hypoxic.  CXR showed bilateral opacities and bilateral mild edema.  Troponin was elevated at 708 with repeat up to 1454.  BNP mildly elevated at 366.  EKG with ST depressions and global ischemia.  Leukocytosis to 23.6.  She was initially placed on Ventimask but was later upgraded to BiPAP.  She continued to require BiPAP which she did feel subjective improvement in symptoms.  Patient stable subsequently called for transfer to ICU at [facility] for further evaluation management. ? ?Pertinent  Medical History:  ?has History of colonic diverticulitis; Osteoarthritis; Hypertension; Hyperlipidemia; Hypothyroid; GERD (gastroesophageal reflux disease); Osteopenia; Anxiety; Osteoarthritis of right hip; Angioedema; Chronic intractable headache; Occipital neuralgia of left side; Neck pain; Hyperreflexia; Arthritis, multiple joint involvement; Spasmodic dysphonia; Acute respiratory failure ([place]); Respiratory failure ([place]); Community acquired pneumonia; Non-STEMI (non-ST elevated myocardial infarction) ([place]); Cardiogenic shock ([place]); Severe mitral regurgitation; Ischemic cardiomyopathy; Multiple vessel coronary artery disease; and S/P CABG x 4 on their problem list. ? ?Significant Hospital Events: ?Including procedures, antibiotic start and stop dates in addition to other pertinent events   ?4/4 admit, started on Cefepime and Azith and Bipap.   Trop rising, on heparin gtt,  Echo with reduced EF and regional wall motion abnormalities with severe MR, cardiology consulted and taken to cath lab which revealed severe LM and 3V disease.  IABP placed and intubated, in cardiogenic shock ?4/6 Lying in bed on IABP with progressive stability seen, plan for CABG with MVR tomorrow  ?4/7 OR to CABG and MVR, no issues overnight  ?4/8 Extubated and IABP removed.  Chest tubes removed ?4/9 up to chair. ?4/10 improved UOP with metolazone and increased lasix ? ? ?Interim History / Subjective:  ?Still having nausea this morning, which has been limiting how much she can eat. Coughing some, some sputum. ? ?Objective:  ?Blood pressure 96/82, pulse 97, temperature 98.2 ?F (36.8 ?C), temperature source Oral, resp. rate 17, height '5\' 4"'$  (1.626 m), weight 58.9 kg, SpO2 97 %. ?   ?   ? ?Intake/Output Summary (Last 24 hours) at 02/12/2022 0743 ?Last data filed at 02/12/2022 0600 ?Gross per 24 hour  ?Intake 2413.66 ml  ?Output 2160 ml  ?Net 253.66 ml  ? ? ?Filed Weights  ? 02/10/22 0700 02/11/22 0100 02/12/22 0637  ?Weight: 62.7 kg 61.5 kg 58.9 kg  ? ?Physical Exam  ?General: elderly woman sitting up in the recliner  ?HEENT: [place]/AT, eyes anicteric ?Neuro: awake, alert, moving all extremities ?CV: S1S2, RRR ?PULM: breathing comfortably on [place], reduced basilar breath sounds ?GI: soft, NT. Actively vomiting-- nonbloody, nonbilious ?Extremities: edema improving, no cyanosis ?Skin: sternal incision healing well, no erythema. No rashes. ? ?Coox 67% ?BUN 22 ?Cr 0.92 ?WBC 18.1 ?H/H 9.4/28.6 ?Platelets 602 ?INR 1.4 ?CXR personally reviewed> improving effusions, RLL infiltrate ? ?Assessment & Plan:  ? ?Acute mixed cardiogenic and distributive shock following cardiac surgery requiring titration of norepinephrine, milrinone  ?Hypervolemia improving ?Coronary artery  disease status post CABG ?Severe ischemic MR status post MVR ?Atrial flutter ?- post-op care per TCTS; pacing wires to be removed  today ?-exchange RIJ introducer for PICC ?-con't pain control per protocol, minimize opiates as able due to previous issues with hypoventilation ?-con't NE & midodrine ?-daily coox ?-con't diuresis ?-coumadin x 3 months; dosing per pharmacy ?-oral amiodarone load ? ?Acute respiratory failure with hypoxia and hypercapnia; hypercapnia now resolved.  Hypercapnia was likely due to side effect of opiate pain control. ?Acute pulmonary edema, improving ?Enlarging bilateral pleural effusions due to hypervolemia, improving ?-supplemental O2 as required to maintain SpO2 >90% ?-pulmonary hygiene ?-check PCT, sputum culture ?-OOB mobility as able ?-escalating nausea regimen ? ?Hypokalemia ?-IV repletion due to nausea ?-repeat BMP this afternoon ? ?Hyponatremia ?-monitor ?-avoid hypotonic fluids ? ?Ongoing leukocytosis ?-check PCT, sputum culture ? ?Anemia due to expected post-op blood loss, ongoing critical illness ?-transfuse for Hb<7 or hemodynamically significant bleeding ?-monitor ? ?Hypothyroidism ?-con't synthroid ? ?Hypertension ?Dyslipidemia ?-con't trial of Crestor; previous h/o myalgias with simvastatin ?-daily aspirin ? ?Hyperglycemia, pre-diabetes ?-con't insulin detemir 10 units daily ?-SSI PRN ?Goal BG <180 ?-needs OP follow up for pre-DM ? ?Insomnia  ?--con't melatonin QHS ?-sleep hygiene ? ?Nausea, vomiting ?-con't reglan ?-agree with scopolamine  ?-compazine PRN added; phenergan caused too much sedation ? ?Deconditioning ?-PT, OT, OOB mobility-- nausea limiting these efforts ? ?No family at bedside during rounds today. ? ?Best practice (evaluated daily):  ?Diet/type: Regular consistency (see orders) ?DVT prophylaxis: Prophylactic Lovenox. ?GI prophylaxis: H2B and PPI ?Lines: Right IJ introducer ?Foley:  Yes, and it is no longer needed and removal ordered  ?Code Status:  full code ?Last date of multidisciplinary goals of care discussion: Family updated at bedside 4/10 ? ?This patient is critically ill with  multiple organ system failure which requires frequent high complexity decision making, assessment, support, evaluation, and titration of therapies. This was completed through the application of advanced monitoring technologies and extensive interpretation of multiple databases. During this encounter critical care time was devoted to patient care services described in this note for 35 minutes. ? ?Julian Hy, DO 02/12/22 10:58 AM ?[facility] ? ?

## 2022-02-12 NOTE — Progress Notes (Signed)
6 Days Post-Op Procedure(s) (LRB): ?CORONARY ARTERY BYPASS GRAFTING X4, Using Left and Right Greater Saphenous Vein Harvested Endoscopically (N/A) ?MITRAL VALVE (MV) REPLACEMENT Using 25MM Mitris Valve (N/A) ?TRANSESOPHAGEAL ECHOCARDIOGRAM (TEE) (N/A) ?Subjective: ?Excellent diuresis yesterday- wt down ?Remains a-flutter-fib on iv amio- nauseated ?Norepi weaned 10->>3 mcg ?Walked to hallway  with PT ?Objective: ?Vital signs in last 24 hours: ?Temp:  [97.8 ?F (36.6 ?C)-98.4 ?F (36.9 ?C)] 98.2 ?F (36.8 ?C) (04/13 0645) ?Pulse Rate:  [95-108] 97 (04/13 0700) ?Cardiac Rhythm: Normal sinus rhythm (04/13 0400) ?Resp:  [5-24] 17 (04/13 0700) ?BP: (71-123)/(36-83) 96/82 (04/13 0700) ?SpO2:  [82 %-100 %] 97 % (04/13 0700) ?Weight:  [58.9 kg] 58.9 kg (04/13 2500) ? ?Hemodynamic parameters for last 24 hours: ?  ? ?Intake/Output from previous day: ?04/12 0701 - 04/13 0700 ?In: 2413.7 [P.O.:840; I.V.:1555.7; IV Piggyback:17.9] ?Out: 2160 [Urine:2160] ?Intake/Output this shift: ?No intake/output data recorded. ? ?  ?   Exam ? ?  General- alert and comfortable ?   Neck- no JVD, no cervical adenopathy palpable, no carotid bruit ?  Lungs- clear without rales, wheezes ?  Cor- regular rate and rhythm, no murmur , gallop ?  Abdomen- soft, non-tender ?  Extremities - warm, non-tender, minimal edema ?  Neuro- oriented, appropriate, no focal weakness ? ? ?Lab Results: ?Recent Labs  ?  02/11/22 ?0304 02/12/22 ?3704  ?WBC 22.5* 18.1*  ?HGB 9.4* 9.4*  ?HCT 28.8* 28.6*  ?PLT 546* 602*  ? ?BMET:  ?Recent Labs  ?  02/11/22 ?8889 02/12/22 ?1694  ?NA 133* 132*  ?K 3.5 3.0*  ?CL 82* 84*  ?CO2 38* 37*  ?GLUCOSE 167* 180*  ?BUN 21 22  ?CREATININE 1.01* 0.92  ?CALCIUM 8.3* 8.0*  ?  ?PT/INR:  ?Recent Labs  ?  02/12/22 ?5038  ?LABPROT 17.1*  ?INR 1.4*  ? ?ABG ?   ?Component Value Date/Time  ? PHART 7.557 (H) 02/10/2022 8828  ? HCO3 38.0 (H) 02/10/2022 0034  ? TCO2 39 (H) 02/10/2022 9179  ? ACIDBASEDEF 3.0 (H) 02/07/2022 1010  ? O2SAT 67.1 02/12/2022  0417  ? ?CBG (last 3)  ?Recent Labs  ?  02/11/22 ?2357 02/12/22 ?0413 02/12/22 ?1505  ?GLUCAP 114* 153* 144*  ? ? ?Assessment/Plan: ?S/P Procedure(s) (LRB): ?CORONARY ARTERY BYPASS GRAFTING X4, Using Left and Right Greater Saphenous Vein Harvested Endoscopically (N/A) ?MITRAL VALVE (MV) REPLACEMENT Using 25MM Mitris Valve (N/A) ?TRANSESOPHAGEAL ECHOCARDIOGRAM (TEE) (N/A) ?Mobilize ?Increase coumadin to 4 mg and DC EPWs ?PICC line and DC sheath placed in OR ?Cont amio per cardiology ? LOS: 9 days  ? ? ?Tonya Hamilton ?02/12/2022 ?  ?

## 2022-02-13 ENCOUNTER — Inpatient Hospital Stay (HOSPITAL_COMMUNITY): Payer: Medicare HMO

## 2022-02-13 DIAGNOSIS — J90 Pleural effusion, not elsewhere classified: Secondary | ICD-10-CM | POA: Diagnosis not present

## 2022-02-13 DIAGNOSIS — J9601 Acute respiratory failure with hypoxia: Secondary | ICD-10-CM | POA: Diagnosis not present

## 2022-02-13 DIAGNOSIS — R112 Nausea with vomiting, unspecified: Secondary | ICD-10-CM | POA: Diagnosis not present

## 2022-02-13 LAB — CBC
HCT: 25.1 % — ABNORMAL LOW (ref 36.0–46.0)
Hemoglobin: 8 g/dL — ABNORMAL LOW (ref 12.0–15.0)
MCH: 28.1 pg (ref 26.0–34.0)
MCHC: 31.9 g/dL (ref 30.0–36.0)
MCV: 88.1 fL (ref 80.0–100.0)
Platelets: 562 10*3/uL — ABNORMAL HIGH (ref 150–400)
RBC: 2.85 MIL/uL — ABNORMAL LOW (ref 3.87–5.11)
RDW: 14.8 % (ref 11.5–15.5)
WBC: 15 10*3/uL — ABNORMAL HIGH (ref 4.0–10.5)
nRBC: 0 % (ref 0.0–0.2)

## 2022-02-13 LAB — GLUCOSE, CAPILLARY
Glucose-Capillary: 107 mg/dL — ABNORMAL HIGH (ref 70–99)
Glucose-Capillary: 107 mg/dL — ABNORMAL HIGH (ref 70–99)
Glucose-Capillary: 121 mg/dL — ABNORMAL HIGH (ref 70–99)
Glucose-Capillary: 135 mg/dL — ABNORMAL HIGH (ref 70–99)
Glucose-Capillary: 149 mg/dL — ABNORMAL HIGH (ref 70–99)
Glucose-Capillary: 152 mg/dL — ABNORMAL HIGH (ref 70–99)

## 2022-02-13 LAB — COOXEMETRY PANEL
Carboxyhemoglobin: 1.7 % — ABNORMAL HIGH (ref 0.5–1.5)
Carboxyhemoglobin: 2.5 % — ABNORMAL HIGH (ref 0.5–1.5)
Methemoglobin: <0.7 % (ref 0.0–1.5)
Methemoglobin: <0.7 % (ref 0.0–1.5)
O2 Saturation: 49.3 %
O2 Saturation: 65.3 %
Total hemoglobin: 8.3 g/dL — ABNORMAL LOW (ref 12.0–16.0)
Total hemoglobin: 8.8 g/dL — ABNORMAL LOW (ref 12.0–16.0)

## 2022-02-13 LAB — PROTIME-INR
INR: 1.5 — ABNORMAL HIGH (ref 0.8–1.2)
Prothrombin Time: 18 seconds — ABNORMAL HIGH (ref 11.4–15.2)

## 2022-02-13 LAB — BASIC METABOLIC PANEL
Anion gap: 7 (ref 5–15)
Anion gap: 12 (ref 5–15)
BUN: 20 mg/dL (ref 8–23)
BUN: 25 mg/dL — ABNORMAL HIGH (ref 8–23)
CO2: 35 mmol/L — ABNORMAL HIGH (ref 22–32)
CO2: 34 mmol/L — ABNORMAL HIGH (ref 22–32)
Calcium: 8.3 mg/dL — ABNORMAL LOW (ref 8.9–10.3)
Calcium: 8.7 mg/dL — ABNORMAL LOW (ref 8.9–10.3)
Chloride: 90 mmol/L — ABNORMAL LOW (ref 98–111)
Chloride: 87 mmol/L — ABNORMAL LOW (ref 98–111)
Creatinine, Ser: 0.92 mg/dL (ref 0.44–1.00)
Creatinine, Ser: 1.1 mg/dL — ABNORMAL HIGH (ref 0.44–1.00)
GFR, Estimated: >60 mL/min (ref >60)
GFR, Estimated: 52 mL/min — ABNORMAL LOW (ref >60)
Glucose, Bld: 105 mg/dL — ABNORMAL HIGH (ref 70–99)
Glucose, Bld: 146 mg/dL — ABNORMAL HIGH (ref 70–99)
Potassium: 3.6 mmol/L (ref 3.5–5.1)
Potassium: 3.9 mmol/L (ref 3.5–5.1)
Sodium: 132 mmol/L — ABNORMAL LOW (ref 135–145)
Sodium: 133 mmol/L — ABNORMAL LOW (ref 135–145)

## 2022-02-13 LAB — MAGNESIUM
Magnesium: 2.3 mg/dL (ref 1.7–2.4)
Magnesium: 2.2 mg/dL (ref 1.7–2.4)

## 2022-02-13 MED ORDER — POTASSIUM CHLORIDE CRYS ER 20 MEQ PO TBCR
20.0000 meq | EXTENDED_RELEASE_TABLET | ORAL | Status: AC
  Administered 2022-02-13 (×3): 20 meq via ORAL
  Filled 2022-02-13 (×3): qty 1

## 2022-02-13 MED ORDER — METOLAZONE 2.5 MG PO TABS
2.5000 mg | ORAL_TABLET | Freq: Once | ORAL | Status: AC
  Administered 2022-02-13: 2.5 mg via ORAL
  Filled 2022-02-13: qty 1

## 2022-02-13 MED ORDER — FLUTICASONE PROPIONATE 50 MCG/ACT NA SUSP
1.0000 | Freq: Every day | NASAL | Status: DC
  Administered 2022-02-13 – 2022-02-19 (×5): 1 via NASAL
  Filled 2022-02-13: qty 16

## 2022-02-13 MED ORDER — PROCHLORPERAZINE EDISYLATE 10 MG/2ML IJ SOLN
10.0000 mg | Freq: Four times a day (QID) | INTRAMUSCULAR | Status: DC | PRN
  Administered 2022-02-13 – 2022-02-15 (×2): 10 mg via INTRAVENOUS
  Filled 2022-02-13 (×3): qty 2

## 2022-02-13 MED ORDER — WARFARIN SODIUM 2 MG PO TABS
4.0000 mg | ORAL_TABLET | Freq: Every day | ORAL | Status: AC
  Administered 2022-02-13: 4 mg via ORAL
  Filled 2022-02-13: qty 2

## 2022-02-13 MED ORDER — POTASSIUM CHLORIDE CRYS ER 20 MEQ PO TBCR
40.0000 meq | EXTENDED_RELEASE_TABLET | Freq: Once | ORAL | Status: AC
  Administered 2022-02-13: 40 meq via ORAL
  Filled 2022-02-13: qty 2

## 2022-02-13 NOTE — Progress Notes (Signed)
? ?NAME:  Tonya Hamilton, MRN:  [id number], DOB:  06-17-1946, LOS: 10 ?ADMISSION DATE:  02/02/2022, CONSULTATION DATE:  02/03/22 ?REFERRING MD:  Christy Gentles CHIEF COMPLAINT:  Dyspnea  ? ?History of Present Illness:  ?Tonya Hamilton is a 76 y.o. female who has a PMH as outlined below.  She presented to [facility] ED 4/3 with dyspnea.  She states that symptoms began suddenly and progressed.  She also endorsed cough intimately productive of sputum.  No fevers/chills/sweats, chest pain, N/V/D, abdominal pain, myalgias, exposures known sick contacts, recent travel. ? ?In ED, she was found to be hypoxic.  CXR showed bilateral opacities and bilateral mild edema.  Troponin was elevated at 708 with repeat up to 1454.  BNP mildly elevated at 366.  EKG with ST depressions and global ischemia.  Leukocytosis to 23.6.  She was initially placed on Ventimask but was later upgraded to BiPAP.  She continued to require BiPAP which she did feel subjective improvement in symptoms.  Patient stable subsequently called for transfer to ICU at [facility] for further evaluation management. ? ?Pertinent  Medical History:  ?has History of colonic diverticulitis; Osteoarthritis; Hypertension; Hyperlipidemia; Hypothyroid; GERD (gastroesophageal reflux disease); Osteopenia; Anxiety; Osteoarthritis of right hip; Angioedema; Chronic intractable headache; Occipital neuralgia of left side; Neck pain; Hyperreflexia; Arthritis, multiple joint involvement; Spasmodic dysphonia; Acute respiratory failure ([place]); Respiratory failure ([place]); Community acquired pneumonia; Non-STEMI (non-ST elevated myocardial infarction) ([place]); Cardiogenic shock ([place]); Severe mitral regurgitation; Ischemic cardiomyopathy; Multiple vessel coronary artery disease; and S/P CABG x 4 on their problem list. ? ?Significant Hospital Events: ?Including procedures, antibiotic start and stop dates in addition to other pertinent events   ?4/4 admit, started on Cefepime and Azith and Bipap.   Trop rising, on heparin gtt,  Echo with reduced EF and regional wall motion abnormalities with severe MR, cardiology consulted and taken to cath lab which revealed severe LM and 3V disease.  IABP placed and intubated, in cardiogenic shock ?4/6 Lying in bed on IABP with progressive stability seen, plan for CABG with MVR tomorrow  ?4/7 OR to CABG and MVR, no issues overnight  ?4/8 Extubated and IABP removed.  Chest tubes removed ?4/9 up to chair. ?4/10 improved UOP with metolazone and increased lasix ?4/13 PICC placed ? ?Interim History / Subjective:  ?Tonya Hamilton denies complaints.  No vomiting since yesterday morning. ? ?Objective:  ?Blood pressure (!) 98/55, pulse (!) 102, temperature 98.2 ?F (36.8 ?C), temperature source Oral, resp. rate 20, height '5\' 4"'$  (1.626 m), weight 59.7 kg, SpO2 (!) 87 %. ?   ?   ? ?Intake/Output Summary (Last 24 hours) at 02/13/2022 0716 ?Last data filed at 02/13/2022 0400 ?Gross per 24 hour  ?Intake 554.68 ml  ?Output 650 ml  ?Net -95.32 ml  ? ? ?Filed Weights  ? 02/11/22 0100 02/12/22 0637 02/13/22 0500  ?Weight: 61.5 kg 58.9 kg 59.7 kg  ? ?Physical Exam  ?General:  ill-appearing woman sitting in the recliner no acute distress, looks better than yesterday. ?HEENT: [place]/AT, eyes anicteric ?Neuro: Awake and alert, moving all extremities. ?CV: S1-S2, regular rate and rhythm ?PULM: Breathing comfortably on room air, improved basilar breath sounds.  CTA B. ?GI: Soft, nontender, nondistended ?Extremities: No cyanosis, no significant edema ?Skin: No diffuse rashes, warm and dry.  Sternal incision healing well.  No erythema around left upper extremity PICC. ? ?Coox 49.3%> 65.3 ?Na+  132 ?BUN 20 ?Cr 0.92 ?WBC 15.0 ?H/H 8.0/25.1 ?Platelets 562 ?INR 1.5 ?CXR personally reviewed> minimal persistent effusions, improving, R  hemidiaphragm still silhouetted ? ?Assessment & Plan:  ? ?Shock resolved-had been mixed cardiogenic and distributive shock following cardiac surgery  ?Hypervolemia improved ?Coronary  artery disease status post CABG ?Severe ischemic MR status post MVR ?Atrial flutter ?- Continue amiodarone ?- Recheck coox this morning> no need to resume inotropes with significant improvement on recheck ?-Prefer Tylenol for pain control with ongoing nausea issues ?-Off all inotropes ?-Daily cooximetry ?-Maintenance diuresis with torsemide ?-coumadin x 3 months; dosing per pharmacy ? ?Acute respiratory failure with hypoxia and hypercapnia; hypercapnia now resolved.  Hypercapnia was likely due to side effect of opiate pain control. ?Acute pulmonary edema, improving ?Bilateral pleural effusions due to hypervolemia, improving ?-Pulmonary hygiene ?- Out of bed mobility as able ?- Supplemental oxygen as required to maintain SPO2 greater than 90%. ? ?Hypokalemia, resolved ?-Monitor ? ?Hyponatremia ?-Avoid hypotonic fluids ? ?Ongoing leukocytosis ?-Continue to monitor ?- RIJ introducer removed 4/13 ? ?Anemia due to expected post-op blood loss, ongoing critical illness ?-Transfuse for hemoglobin less than 7 or hemodynamically significant bleeding ?-Continue to monitor ? ?Hypothyroidism ?-Continue Synthroid ? ?Hypertension ?Dyslipidemia ?-Daily aspirin ?- Continue Crestor.  Seems to be tolerating this okay.  Previously had myalgias with simvastatin. ? ?Hyperglycemia, pre-diabetes ?-Continue basal bolus insulin-Levemir 10 units daily, sliding scale as needed ?- Needs outpatient follow-up for prediabetes with PCP ? ?Insomnia  ?-Sleep hygiene, melatonin nightly ? ?Nausea, vomiting ?-Continue scopolamine ?-Completed Reglan regimen yesterday ?- Compazine as needed for nausea ?-Phenergan caused too much sedation in the past ? ?Deconditioning ?-PT, OT, OOB mobility-- nausea limiting these efforts ? ?No family at bedside during rounds today. ? ?Best practice (evaluated daily):  ?Diet/type: Regular consistency (see orders) ?DVT prophylaxis: Prophylactic Lovenox. ?GI prophylaxis: H2B and PPI ?Lines: Left PICC ?Foley:  N/A ?Code  Status:  full code ?Last date of multidisciplinary goals of care discussion: Family updated at bedside 4/10 ? ? ? ?Julian Hy, DO 02/13/22 10:03 AM ?[facility] ? ?

## 2022-02-13 NOTE — Progress Notes (Signed)
Pt was brought to [place] for diagnostic thoracentesis. Upon US examination, there was no fluid on L and a very small fluid pocket on R that was not large enough to safely access.  ?Exam was ended and explained to patient.  ?Patient was in agreement with findings.   ? ? ?Narda Rutherford, AGNP-BC ?02/13/2022, 4:04 PM ? ? ?

## 2022-02-13 NOTE — PMR Pre-admission (Shared)
PMR Admission Coordinator Pre-Admission Assessment ? ?Patient: Tonya Hamilton is an 76 y.o., female ?MRN: [id number] ?DOB: Oct 11, 1946 ?Height: '5\' 4"'$  (162.6 cm) ?Weight: 59.7 kg ? ?Insurance Information ?HMO:     PPO:      PCP:      IPA:      80/20:      OTHER:  ?PRIMARY: [employer] Medicare      Policy#: [id number]      Subscriber: pt ?CM Name: ***      Phone#: ***     Fax#: [phone] ?Pre-Cert#: [id number]      Employer:  ?Benefits:  Phone #: [phone]     Name: 4/13 ?Eff. Date: 11/02/2020     Deduct: none      Out of Pocket Max: $4500      Life Max: none ?CIR: $295 co pay per day days 1 until 6      SNF: no copay days 1 until 20; $196 co pay per day days 21 until 100 ?Outpatient: $35 per visit     Co-Pay: visits per medical neccesity ?Home Health: 100%      Co-Pay: visits per medical neccesity ?DME: 80%     Co-Pay: 20% ?Providers: in network ? ?SECONDARY: none       ? ?Financial Counselor:       Phone#:  ? ?The ?Data Collection Information Summary? for patients in Inpatient Rehabilitation Facilities with attached ?Privacy Act [place] Records? was provided and verbally reviewed with: Patient and Family ? ?Emergency Contact Information ?Contact Information   ? ? Name Relation Home Work Mobile  ? Grabel,Charles Spouse   [phone]  ? Coppedge,Melanie Daughter   [phone]  ? ?  ? ? ?Current Medical History  ?Patient Admitting Diagnosis: Debility after CABG/MVR ? ?History of Present Illness: Tonya Hamilton is a 76 year old female with history of HTN, colon polyps, chronic/frequent UTIs-on bactrim, hypothyroidism who was admitted on 02/03/22 with SOB due to acute hypoxic respiratory failure due to CAP, acute systolic/diastolic heart failure, diffuse ST depression with elevated trops due to NSTEMI and cardiogenic shock. She was started on IV antibiotics and 2/d echo showed EF 30-35% with severe MVR. Cardiac cath done revealing severe CAD and Left main disease. She was intubated, had IABP placed.  She underwent CABG X 4 with MVR by Dr. Cyndia Bent on 02/06/22. Post op on pressors and milrinone per Dr. Haroldine Laws. She tolerated extubation by 04/08 but had issues with fluid overload as well as lethargy felt to be due to narcotics. A Flutter converted with amiodarone and midodrine added to wean off NE.  ?  ?Follow up echo 04/12 showed large density in LA cavity felt to be artifact. She continues to be hypotensive treated with IV albumin and has been transitioned to po amiodarone as of 04/13. Has completed antibiotic course and  WBC trending down. AKI has resolved. Nausea vomiting treated with course of Reglan and scopolamine patch added.  ?  ?Interventional radiology consulted for input on right effusion and  ?  ?Hyperglycemia being managed with SSI.  ?  ? ****** ? ?Patient's medical record from [facility] has been reviewed by the rehabilitation admission coordinator and physician. ? ?Past Medical History  ?Past Medical History:  ?Diagnosis Date  ? Arthritis   ? Colon polyps   ? Diverticulitis   ? Hyperlipidemia   ? Hypertension   ? Hypothyroidism   ? PONV (postoperative nausea and vomiting)   ? Thyroid disease   ? UTI (urinary tract  infection)   ? ? ?Has the patient had major surgery during 100 days prior to admission? Yes ? ?Family History   ?family history includes Arthritis in her mother; Heart Problems in her maternal grandfather; Heart disease in her father and mother; Hypertension in her father and mother; Stroke in her father. ? ?Current Medications ? ?Current Facility-Administered Medications:  ?  0.45 % sodium chloride infusion, , Intravenous, Continuous PRN, John Giovanni, PA-C, Stopped at 02/08/22 7124 ?  0.9 %  sodium chloride infusion, 250 mL, Intravenous, Continuous, Gold, Wayne E, PA-C ?  0.9 %  sodium chloride infusion, , Intravenous, Continuous, Gold, Wayne E, PA-C, Stopped at 02/12/22 1734 ?  amiodarone (PACERONE) tablet 200 mg, 200 mg, Oral, BID, Joette Catching, PA-C, 200 mg at  02/13/22 5809 ?  aspirin EC tablet 81 mg, 81 mg, Oral, Daily, 81 mg at 02/13/22 0910 **OR** [DISCONTINUED] aspirin chewable tablet 324 mg, 324 mg, Per Tube, Daily, Gold, Wayne E, PA-C, 324 mg at 02/07/22 9833 ?  bisacodyl (DULCOLAX) EC tablet 10 mg, 10 mg, Oral, Daily, 10 mg at 02/12/22 1005 **OR** bisacodyl (DULCOLAX) suppository 10 mg, 10 mg, Rectal, Daily, Gold, Wayne E, PA-C ?  Chlorhexidine Gluconate Cloth 2 % PADS 6 each, 6 each, Topical, Daily, [name], MD, 6 each at 02/12/22 1006 ?  docusate sodium (COLACE) capsule 200 mg, 200 mg, Oral, Daily, Gold, Wayne E, PA-C, 200 mg at 02/12/22 1005 ?  enoxaparin (LOVENOX) injection 40 mg, 40 mg, Subcutaneous, QHS, Bartle, Fernande Boyden, MD, 40 mg at 02/12/22 2136 ?  famotidine (PEPCID) IVPB 20 mg premix, 20 mg, Intravenous, Once, Einar Grad, [facility], Stopped at 02/07/22 1703 ?  feeding supplement (ENSURE ENLIVE / ENSURE PLUS) liquid 237 mL, 237 mL, Oral, BID BM, Dahlia Byes, MD, 237 mL at 02/13/22 0916 ?  feeding supplement (GLUCERNA SHAKE) (GLUCERNA SHAKE) liquid 237 mL, 237 mL, Oral, BID BM, [name], MD, 237 mL at 02/11/22 1002 ?  fluticasone (FLONASE) 50 MCG/ACT nasal spray 1 spray, 1 spray, Each Nare, Daily, Julian Hy, DO, 1 spray at 02/13/22 8250 ?  Gerhardt's butt cream, , Topical, Daily PRN, Noemi Chapel P, DO, 1 application. at 02/13/22 0845 ?  insulin aspart (novoLOG) injection 0-15 Units, 0-15 Units, Subcutaneous, TID WC, [name], MD, 2 Units at 02/13/22 1231 ?  insulin detemir (LEVEMIR) injection 10 Units, 10 Units, Subcutaneous, Daily, [name], MD, 10 Units at 02/13/22 [phone] ?  lactated ringers infusion, , Intravenous, Continuous, Gold, Wayne E, PA-C ?  lactated ringers infusion, , Intravenous, Continuous, John Giovanni, [place], Stopped at 02/09/22 1631 ?  levothyroxine (SYNTHROID) tablet 25 mcg, 25 mcg, Oral, Q0600, [name], MD, 25 mcg at 02/13/22 0511 ?  MEDLINE mouth rinse, 15 mL, Mouth Rinse, BID, [name], MD, 15 mL at 02/12/22 2144 ?  melatonin tablet 3 mg, 3 mg, Oral, QHS, Julian Hy, DO, 3 mg at 02/12/22 2138 ?  midodrine (PROAMATINE) tablet 5 mg, 5 mg, Oral, TID WC, Bensimhon, Shaune Pascal, MD, 5 mg at 02/13/22 1229 ?  multivitamin with minerals tablet 1 tablet, 1 tablet, Oral, Daily, [name], MD, 1 tablet at 02/13/22 0915 ?  oxyCODONE (Oxy IR/ROXICODONE) immediate release tablet 5-10 mg, 5-10 mg, Per Tube, Q3H PRN, [name], MD, 10 mg at 02/09/22 0210 ?  pantoprazole (PROTONIX) EC tablet 40 mg, 40 mg, Oral, Daily, Gold, Wayne E, PA-C, 40 mg at 02/13/22 0913 ?  potassium chloride SA (KLOR-CON M) CR tablet 40 mEq, 40 mEq, Oral, Once, Joette Catching, PA-C ?  prochlorperazine (COMPAZINE) injection 10 mg, 10 mg, Intravenous, Q6H PRN, Julian Hy, DO ?  promethazine (PHENERGAN) 6.25 MG/5ML syrup 12.5 mg, 12.5 mg, Oral, Q6H PRN, Dahlia Byes, MD ?  protein supplement (ENSURE MAX) liquid, 11 oz, Oral, QHS, [name], MD, 237 mL at 02/12/22 2135 ?  rosuvastatin (CRESTOR) tablet 5 mg, 5 mg, Oral, Daily, Agarwala, Ravi, MD, 5 mg at 02/13/22 0914 ?  scopolamine (TRANSDERM-SCOP) 1 MG/3DAYS 1.5 mg, 1 patch, Transdermal, Q72H, Dahlia Byes, MD, 1.5 mg at 02/12/22 0930 ?  simethicone (MYLICON) 40 HW/3.8UE suspension 40 mg, 40 mg, Oral, Q6H PRN, [name], MD ?  sodium chloride flush (NS) 0.9 % injection 10-40 mL, 10-40 mL, Intracatheter, Q12H, Bartle, Fernande Boyden, MD, 10 mL at 02/12/22 2129 ?  sodium chloride flush (NS) 0.9 % injection 10-40 mL, 10-40 mL, Intracatheter, PRN, [name], MD ?  sodium chloride flush (NS) 0.9 % injection 10-40 mL, 10-40 mL, Intracatheter, Q12H, [name], MD, 10 mL at 02/13/22 0936 ?  sodium chloride flush (NS) 0.9 % injection 10-40 mL, 10-40 mL, Intracatheter, PRN, [name], MD ?  sodium chloride flush (NS) 0.9 % injection 3 mL, 3 mL, Intravenous, Q12H, Gold, Wayne E, PA-C, 3 mL at 02/12/22 2130 ?  sodium chloride flush (NS) 0.9 %  injection 3 mL, 3 mL, Intravenous, PRN, Gold, Wayne E, PA-C ?  torsemide (DEMADEX) tablet 40 mg, 40 mg, Oral, Daily, Joette Catching, PA-C, 40 mg at 02/13/22 2800 ?  traMADol (ULTRAM) tablet 50 mg, 50 m

## 2022-02-13 NOTE — Progress Notes (Addendum)
?  ? ? Advanced Heart Failure Rounding Note ? ?PCP-Cardiologist: None  ? ?Subjective:   ? ?76 y/o with severe CAD/NSTEMI and iCM EF 30-35%. Developed refractory angina and shock with respiratory failure. Intubated and IABP placed.  ?4/7 CABG + MVR  ?4/8 Extubated, IABP removed  ?04/11 AFL >>converted shortly after starting amio gtt ? ?POD # 7 ? ?NE weaned off yesterday. Has been off milrinone. Had some soft BP yesterday evening down to upper 80s-90s. Albumin added. SBP 100s-110s this am. ? ?Co-ox 49% this am. STAT recheck pending. ? ?CVP 11-12. ? ?Scopolamine added yesterday for N, V.  ? ?Denies recurrent vomiting overnight. Able to eat a little more this am.  ? ?Has already been up ambulating.  ? ? ?Limited echo 04/12: ?EF 35-40%, RMA, RV okay, mean gradient 7 across mitral valve prosthesis, large density in LA cavity that likely represents artifact  ? ? ?Objective:   ?Weight Range: ?59.7 kg ?Body mass index is 22.59 kg/m?.  ? ?Vital Signs:   ?Temp:  [98.2 ?F (36.8 ?C)-98.6 ?F (37 ?C)] 98.2 ?F (36.8 ?C) (04/14 0300) ?Pulse Rate:  [89-102] 102 (04/14 0700) ?Resp:  [11-23] 20 (04/14 0700) ?BP: (84-115)/(39-66) 98/55 (04/14 0500) ?SpO2:  [87 %-97 %] 87 % (04/14 0700) ?Weight:  [59.7 kg] 59.7 kg (04/14 0500) ?Last BM Date : 02/11/22 ? ?Weight change: ?Filed Weights  ? 02/11/22 0100 02/12/22 0637 02/13/22 0500  ?Weight: 61.5 kg 58.9 kg 59.7 kg  ? ? ?Intake/Output:  ? ?Intake/Output Summary (Last 24 hours) at 02/13/2022 0749 ?Last data filed at 02/13/2022 0400 ?Gross per 24 hour  ?Intake 554.68 ml  ?Output 650 ml  ?Net -95.32 ml  ?  ? ? ?Physical Exam  ? ?General:  No distress. Sitting up in chair. ?HEENT: normal ?Neck: supple. JVP 8 cm. Carotids 2+ bilat; no bruits.  ?Cor: PMI nondisplaced. Regular rate & rhythm. No rubs, gallops or murmurs. Sternum stable. ?Lungs: clear ?Abdomen: soft, nontender, nondistended. No hepatosplenomegaly.  ?Extremities: no cyanosis, clubbing, rash, + TED hose, + LUE PICC line ?Neuro: alert &  orientedx3, cranial nerves grossly intact. moves all 4 extremities w/o difficulty. Affect pleasant ? ? ? ? ? ?Telemetry  ? ?SR 90s ? ?Labs  ?  ?CBC ?Recent Labs  ?  02/12/22 ?5621 02/13/22 ?0310  ?WBC 18.1* 15.0*  ?HGB 9.4* 8.0*  ?HCT 28.6* 25.1*  ?MCV 86.9 88.1  ?PLT 602* 562*  ? ?Basic Metabolic Panel ?Recent Labs  ?  02/12/22 ?3086 02/12/22 ?1547 02/13/22 ?0310  ?NA 132* 134* 132*  ?K 3.0* 4.4 3.6  ?CL 84* 87* 90*  ?CO2 37* 35* 35*  ?GLUCOSE 180* 145* 105*  ?BUN '22 19 20  '$ ?CREATININE 0.92 0.95 0.92  ?CALCIUM 8.0* 8.2* 8.3*  ?MG 2.2  --  2.3  ? ?Liver Function Tests ?Recent Labs  ?  02/10/22 ?2000  ?AST 53*  ?ALT 84*  ?ALKPHOS 130*  ?BILITOT 0.9  ?PROT 5.6*  ?ALBUMIN 2.7*  ? ?No results for input(s): LIPASE, AMYLASE in the last 72 hours. ?Cardiac Enzymes ?No results for input(s): CKTOTAL, CKMB, CKMBINDEX, TROPONINI in the last 72 hours. ? ?BNP: ?BNP (last 3 results) ?Recent Labs  ?  02/02/22 ?2321  ?BNP 366.4*  ? ? ?ProBNP (last 3 results) ?No results for input(s): PROBNP in the last 8760 hours. ? ? ?D-Dimer ?No results for input(s): DDIMER in the last 72 hours. ?Hemoglobin A1C ?No results for input(s): HGBA1C in the last 72 hours. ? ?Fasting Lipid Panel ?No results for  input(s): CHOL, HDL, LDLCALC, TRIG, CHOLHDL, LDLDIRECT in the last 72 hours. ? ?Thyroid Function Tests ?No results for input(s): TSH, T4TOTAL, T3FREE, THYROIDAB in the last 72 hours. ? ?Invalid input(s): FREET3 ? ? ?Other results: ? ? ?Imaging  ? ? ?DG Chest Port 1 View ? ?Result Date: 02/13/2022 ?CLINICAL DATA:  76 year old female status post mitral valve replacement and CABG postoperative day 7. EXAM: PORTABLE CHEST 1 VIEW COMPARISON:  Portable chest 02/12/2022 and earlier. FINDINGS: Portable AP semi upright view at 0531 hours. Right IJ introducer sheath removed. Epicardial pacer wires removed. Left PICC line now in place, tip at the cavoatrial junction level. Small to moderate bilateral pleural effusions with lung base hypo ventilation. No  pneumothorax. No pulmonary edema. Stable cardiac size and mediastinal contours. Stable visualized osseous structures. Negative visible bowel gas. IMPRESSION: 1. Left PICC line placed.  Other lines and tubes removed. 2. Small to moderate pleural effusions with lung base hypoventilation. Electronically Signed   By: Genevie Ann M.D.   On: 02/13/2022 06:49  ? ?[place] EKG SITE RITE ? ?Result Date: 02/12/2022 ?If [employer] not attached, placement could not be confirmed due to current cardiac rhythm.  ? ? ?Medications:   ? ? ?Scheduled Medications: ? amiodarone  200 mg Oral BID  ? aspirin EC  81 mg Oral Daily  ? bisacodyl  10 mg Oral Daily  ? Or  ? bisacodyl  10 mg Rectal Daily  ? Chlorhexidine Gluconate Cloth  6 each Topical Daily  ? docusate sodium  200 mg Oral Daily  ? enoxaparin (LOVENOX) injection  40 mg Subcutaneous QHS  ? feeding supplement  237 mL Oral BID BM  ? feeding supplement (GLUCERNA SHAKE)  237 mL Oral BID BM  ? fluticasone  1 spray Each Nare Daily  ? insulin aspart  0-15 Units Subcutaneous TID WC  ? insulin detemir  10 Units Subcutaneous Daily  ? levothyroxine  25 mcg Oral Q0600  ? mouth rinse  15 mL Mouth Rinse BID  ? melatonin  3 mg Oral QHS  ? midodrine  5 mg Oral TID WC  ? multivitamin with minerals  1 tablet Oral Daily  ? pantoprazole  40 mg Oral Daily  ? potassium chloride  20 mEq Oral Q4H  ? Ensure Max Protein  11 oz Oral QHS  ? rosuvastatin  5 mg Oral Daily  ? scopolamine  1 patch Transdermal Q72H  ? sodium chloride flush  10-40 mL Intracatheter Q12H  ? sodium chloride flush  10-40 mL Intracatheter Q12H  ? sodium chloride flush  3 mL Intravenous Q12H  ? torsemide  40 mg Oral Daily  ? Warfarin - Physician Dosing Inpatient   Does not apply q1600  ? ? ?Infusions: ? sodium chloride Stopped (02/08/22 0826)  ? sodium chloride    ? sodium chloride Stopped (02/12/22 1734)  ? famotidine (PEPCID) IV Stopped (02/07/22 1703)  ? lactated ringers    ? lactated ringers Stopped (02/09/22 1631)  ? ? ?PRN  Medications: ?sodium chloride, Gerhardt's butt cream, oxyCODONE, prochlorperazine, promethazine, simethicone, sodium chloride flush, sodium chloride flush, sodium chloride flush, traMADol ? ? ? ?Patient Profile  ? ?76 y/o with severe CAD/NSTEMI and iCM EF 30-35%. Developed refractory angina and shock yesterday with respiratory failure. Intubated and IABP placed. Taken to OR 4/7 for CABG + MVR  ? ?Assessment/Plan  ? ?1. NSTEMI---> Cardiogenic Shock ?- HS Trop 237>6283> 6513 ?- Cath with 3V disease, elevated wedge, and reduced cardiac output. IABP placed in lab  with Norepi/Milrinone.  ?- 4/7 s/p CABG + MVR ?- On ASA 325. Would typically give plavix post NSTEMI, but also on warfarin with recent MVR. ?- on statin  ?  ?2. Acute Hypoxemic Respiratory Failure ?- Extubated 4/8. ?- stable ? ?3. Acute HFrEF, ICM  ?- Echo EF 30-35% RV ok. Severe MR. EF improved on post CABG TEE. ?- Limited echo 004/12: EF 35-40%, RV okay, large density in LA cavity (? Thrombus). Will review with Dr. Haroldine Laws to decide on additional imaging. ?- Now s/p CABG ?- Off Milrinone, NE weaned off 04/13. IABP out ?- Co-ox 49%, STAT recheck pending. May need to consider adding back inotrope support if co-ox remains low.  ?- Had soft BP overnight but improved this am after albumin. Continue midodrine 5 mg TID ?- CVP 11-12. Continue Torsemide 40 mg daily. Add metolazone 2.5 mg once. Responded well to this previously. ?- K 3.6 this am. Supp. ?- Start GDMT if blood pressure remains stable. ? ?4. Severe MR  ?- S/p MV replacement with 25 mm Mitris Resilia pericardial valve ?- Warfarin started per [facility] (will be on for 3 months) ?- INR 1.5 ?  ?5. Hypothyroidism  ?-On levothyroxine.  ?  ?6. AKI due to ATN/shock ?- resolved ?- monitor w/ diuresis  ? ?7. Acute blood loss anemia ?- transfuse as needed to keep hgb >= 8.0 ?- Hgb 9.4>8.0. Monitor. ? ?8. Fever ?- suspect atx. Most lines out ?- now resolved  ?- Encourage ongoing use IS ?- WBC remain elevated but  trending down, 23>20>23>18>15.  ?- CXR looks stable ?- Procalcitonin 1.38>0.72, 0.31 ? ?7. Lethargy ?- suspect due to pain meds ?- continues to improve ? ?8. B/l Pleural Effusions ?- Stable on CXR ?- diuresis per above ? ?9. A

## 2022-02-13 NOTE — Discharge Instructions (Addendum)
Discharge Instructions:  1. You may shower, please wash incisions daily with soap and water and keep dry.  If you wish to cover wounds with dressing you may do so but please keep clean and change daily.  No tub baths or swimming until incisions have completely healed.  If your incisions become red or develop any drainage please call our office at [phone]  2. No Driving until cleared by Dr. Bartle's office and you are no longer using narcotic pain medications  3. Monitor your weight daily.. Please use the same scale and weigh at same time... If you gain 5-10 lbs in 48 hours with associated lower extremity swelling, please contact our office at [phone]  4. Fever of 101.5 for at least 24 hours with no source, please contact our office at [phone]  5. Activity- up as tolerated, please walk at least 3 times per day.  Avoid strenuous activity, no lifting, pushing, or pulling with your arms over 8-10 lbs for a minimum of 6 weeks  6. If any questions or concerns arise, please do not hesitate to contact our office at [phone]   Information on my medicine - Coumadin   (Warfarin)  This medication education was reviewed with me or my healthcare representative as part of my discharge preparation.  The pharmacist that spoke with me during my hospital stay was:  Michael T Bitonti, RPH  Why was Coumadin prescribed for you? Coumadin was prescribed for you because you have a blood clot or a medical condition that can cause an increased risk of forming blood clots. Blood clots can cause serious health problems by blocking the flow of blood to the heart, lung, or brain. Coumadin can prevent harmful blood clots from forming. As a reminder your indication for Coumadin is:  Blood Clot Prevention after Heart Valve Surgery  What test will check on my response to Coumadin? While on Coumadin (warfarin) you will need to have an INR test regularly to ensure that your dose is keeping you in the desired  range. The INR (international normalized ratio) number is calculated from the result of the laboratory test called prothrombin time (PT).  If an INR APPOINTMENT HAS NOT ALREADY BEEN MADE FOR YOU please schedule an appointment to have this lab work done by your health care provider within 7 days. Your INR goal is usually a number between:  2 to 3 or your provider may give you a more narrow range like 2-2.5.  Ask your health care provider during an office visit what your goal INR is.  What  do you need to  know  About  COUMADIN? Take Coumadin (warfarin) exactly as prescribed by your healthcare provider about the same time each day.  DO NOT stop taking without talking to the doctor who prescribed the medication.  Stopping without other blood clot prevention medication to take the place of Coumadin may increase your risk of developing a new clot or stroke.  Get refills before you run out.  What do you do if you miss a dose? If you miss a dose, take it as soon as you remember on the same day then continue your regularly scheduled regimen the next day.  Do not take two doses of Coumadin at the same time.  Important Safety Information A possible side effect of Coumadin (Warfarin) is an increased risk of bleeding. You should call your healthcare provider right away if you experience any of the following: Bleeding from an injury or your nose that does not   stop. Unusual colored urine (red or dark brown) or unusual colored stools (red or black). Unusual bruising for unknown reasons. A serious fall or if you hit your head (even if there is no bleeding).  Some foods or medicines interact with Coumadin (warfarin) and might alter your response to warfarin. To help avoid this: Eat a balanced diet, maintaining a consistent amount of Vitamin K. Notify your provider about major diet changes you plan to make. Avoid alcohol or limit your intake to 1 drink for women and 2 drinks for men per day. (1 drink is 5 oz.  wine, 12 oz. beer, or 1.5 oz. liquor.)  Make sure that ANY health care provider who prescribes medication for you knows that you are taking Coumadin (warfarin).  Also make sure the healthcare provider who is monitoring your Coumadin knows when you have started a new medication including herbals and non-prescription products.  Coumadin (Warfarin)  Major Drug Interactions  Increased Warfarin Effect Decreased Warfarin Effect  Alcohol (large quantities) Antibiotics (esp. Septra/Bactrim, Flagyl, Cipro) Amiodarone (Cordarone) Aspirin (ASA) Cimetidine (Tagamet) Megestrol (Megace) NSAIDs (ibuprofen, naproxen, etc.) Piroxicam (Feldene) Propafenone (Rythmol SR) Propranolol (Inderal) Isoniazid (INH) Posaconazole (Noxafil) Barbiturates (Phenobarbital) Carbamazepine (Tegretol) Chlordiazepoxide (Librium) Cholestyramine (Questran) Griseofulvin Oral Contraceptives Rifampin Sucralfate (Carafate) Vitamin K   Coumadin (Warfarin) Major Herbal Interactions  Increased Warfarin Effect Decreased Warfarin Effect  Garlic Ginseng Ginkgo biloba Coenzyme Q10 Green tea St. John's wort    Coumadin (Warfarin) FOOD Interactions  Eat a consistent number of servings per week of foods HIGH in Vitamin K (1 serving =  cup)  Collards (cooked, or boiled & drained) Kale (cooked, or boiled & drained) Mustard greens (cooked, or boiled & drained) Parsley *serving size only =  cup Spinach (cooked, or boiled & drained) Swiss chard (cooked, or boiled & drained) Turnip greens (cooked, or boiled & drained)  Eat a consistent number of servings per week of foods MEDIUM-HIGH in Vitamin K (1 serving = 1 cup)  Asparagus (cooked, or boiled & drained) Broccoli (cooked, boiled & drained, or raw & chopped) Brussel sprouts (cooked, or boiled & drained) *serving size only =  cup Lettuce, raw (green leaf, endive, romaine) Spinach, raw Turnip greens, raw & chopped   These websites have more information on Coumadin  (warfarin):  www.coumadin.com; www.ahrq.gov/consumer/coumadin.htm;     

## 2022-02-13 NOTE — Progress Notes (Addendum)
[facility] DAILY ICU PROGRESS NOTE ? ?                 [address] ?           [place] [address] ?         [phone]  ? ?7 Days Post-Op ?Procedure(s) (LRB): ?CORONARY ARTERY BYPASS GRAFTING X4, Using Left and Right Greater Saphenous Vein Harvested Endoscopically (N/A) ?MITRAL VALVE (MV) REPLACEMENT Using 25MM Mitris Valve (N/A) ?TRANSESOPHAGEAL ECHOCARDIOGRAM (TEE) (N/A) ? ?Total Length of Stay:  LOS: 10 days  ? ?Subjective: ?Up in the bedside chair. Says appetite is improving. Still feels weak but says she is getting stronger. No new concerns.  ? ?Having bowel movements.  ?Currently on RA. ? ?Objective: ?Vital signs in last 24 hours: ?Temp:  [97.6 ?F (36.4 ?C)-98.6 ?F (37 ?C)] 97.6 ?F (36.4 ?C) (04/14 2130) ?Pulse Rate:  [89-102] 102 (04/14 0700) ?Cardiac Rhythm: Sinus tachycardia (04/14 0700) ?Resp:  [11-23] 20 (04/14 0700) ?BP: (84-115)/(39-66) 98/55 (04/14 0500) ?SpO2:  [87 %-97 %] 87 % (04/14 0700) ?Weight:  [59.7 kg] 59.7 kg (04/14 0500) ? ?Filed Weights  ? 02/11/22 0100 02/12/22 0637 02/13/22 0500  ?Weight: 61.5 kg 58.9 kg 59.7 kg  ? ? ?Weight change: 0.8 kg  ?  ? ?Intake/Output from previous day: ?04/13 0701 - 04/14 0700 ?In: 554.7 [I.V.:203.1; IV Piggyback:351.6] ?Out: 650 [Urine:650] ? ?Intake/Output this shift: ?No intake/output data recorded. ? ?Current Meds: ?Scheduled Meds: ? amiodarone  200 mg Oral BID  ? aspirin EC  81 mg Oral Daily  ? bisacodyl  10 mg Oral Daily  ? Or  ? bisacodyl  10 mg Rectal Daily  ? Chlorhexidine Gluconate Cloth  6 each Topical Daily  ? docusate sodium  200 mg Oral Daily  ? enoxaparin (LOVENOX) injection  40 mg Subcutaneous QHS  ? feeding supplement  237 mL Oral BID BM  ? feeding supplement (GLUCERNA SHAKE)  237 mL Oral BID BM  ? fluticasone  1 spray Each Nare Daily  ? insulin aspart  0-15 Units Subcutaneous TID WC  ? insulin detemir  10 Units Subcutaneous Daily  ? levothyroxine  25 mcg Oral Q0600  ? mouth rinse  15 mL Mouth Rinse BID  ? melatonin  3 mg Oral QHS   ? midodrine  5 mg Oral TID WC  ? multivitamin with minerals  1 tablet Oral Daily  ? pantoprazole  40 mg Oral Daily  ? potassium chloride  20 mEq Oral Q4H  ? Ensure Max Protein  11 oz Oral QHS  ? rosuvastatin  5 mg Oral Daily  ? scopolamine  1 patch Transdermal Q72H  ? sodium chloride flush  10-40 mL Intracatheter Q12H  ? sodium chloride flush  10-40 mL Intracatheter Q12H  ? sodium chloride flush  3 mL Intravenous Q12H  ? torsemide  40 mg Oral Daily  ? Warfarin - Physician Dosing Inpatient   Does not apply q1600  ? ?Continuous Infusions: ? sodium chloride Stopped (02/08/22 0826)  ? sodium chloride    ? sodium chloride Stopped (02/12/22 1734)  ? famotidine (PEPCID) IV Stopped (02/07/22 1703)  ? lactated ringers    ? lactated ringers Stopped (02/09/22 1631)  ? ?PRN Meds:.sodium chloride, Gerhardt's butt cream, oxyCODONE, prochlorperazine, promethazine, simethicone, sodium chloride flush, sodium chloride flush, sodium chloride flush, traMADol ? ?General appearance: alert, cooperative, and no distress ?Neurologic: intact ?Heart: regular rate and rhythm and NSR in the 90's ?Lungs: Clear anterior, CXR still showing significant right  pleural effusion, left effusion improved.   ?Abdomen: soft and non-tender. ?Extremities: warm and well perfused, minimal peripheral edema.  ?Wound: the sternotomy incision and bilateral LE incision are intact and dry.  ? ?Lab Results: ?CBC: ?Recent Labs  ?  02/12/22 ?4097 02/13/22 ?0310  ?WBC 18.1* 15.0*  ?HGB 9.4* 8.0*  ?HCT 28.6* 25.1*  ?PLT 602* 562*  ? ?BMET:  ?Recent Labs  ?  02/12/22 ?1547 02/13/22 ?0310  ?NA 134* 132*  ?K 4.4 3.6  ?CL 87* 90*  ?CO2 35* 35*  ?GLUCOSE 145* 105*  ?BUN 19 20  ?CREATININE 0.95 0.92  ?CALCIUM 8.2* 8.3*  ?  ?CMET: ?Lab Results  ?Component Value Date  ? WBC 15.0 (H) 02/13/2022  ? HGB 8.0 (L) 02/13/2022  ? HCT 25.1 (L) 02/13/2022  ? PLT 562 (H) 02/13/2022  ? GLUCOSE 105 (H) 02/13/2022  ? CHOL 151 02/04/2022  ? TRIG 83 02/04/2022  ? HDL 39 (L) 02/04/2022  ?  [place] 95 02/04/2022  ? ALT 84 (H) 02/10/2022  ? AST 53 (H) 02/10/2022  ? NA 132 (L) 02/13/2022  ? K 3.6 02/13/2022  ? CL 90 (L) 02/13/2022  ? CREATININE 0.92 02/13/2022  ? BUN 20 02/13/2022  ? CO2 35 (H) 02/13/2022  ? TSH 2.059 02/03/2022  ? INR 1.5 (H) 02/13/2022  ? HGBA1C 5.7 (H) 02/04/2022  ? ? ? ? ?PT/INR:  ?Recent Labs  ?  02/13/22 ?0310  ?LABPROT 18.0*  ?INR 1.5*  ? ?Radiology: [facility] Chest Port 1 View ? ?Result Date: 02/13/2022 ?CLINICAL DATA:  76 year old female status post mitral valve replacement and CABG postoperative day 7. EXAM: PORTABLE CHEST 1 VIEW COMPARISON:  Portable chest 02/12/2022 and earlier. FINDINGS: Portable AP semi upright view at 0531 hours. Right IJ introducer sheath removed. Epicardial pacer wires removed. Left PICC line now in place, tip at the cavoatrial junction level. Small to moderate bilateral pleural effusions with lung base hypo ventilation. No pneumothorax. No pulmonary edema. Stable cardiac size and mediastinal contours. Stable visualized osseous structures. Negative visible bowel gas. IMPRESSION: 1. Left PICC line placed.  Other lines and tubes removed. 2. Small to moderate pleural effusions with lung base hypoventilation. Electronically Signed   By: Genevie Ann M.D.   On: 02/13/2022 06:49  ? ?[place] EKG SITE RITE ? ?Result Date: 02/12/2022 ?If [employer] not attached, placement could not be confirmed due to current cardiac rhythm.  ? ? ?Assessment/Plan: ?S/P Procedure(s) (LRB): ?CORONARY ARTERY BYPASS GRAFTING X4, Using Left and Right Greater Saphenous Vein Harvested Endoscopically (N/A) ?MITRAL VALVE (MV) REPLACEMENT Using 25MM Mitris Valve (N/A) ?TRANSESOPHAGEAL ECHOCARDIOGRAM (TEE) (N/A) ?-Postop day 4 CABG x4 and mitral valve replacement.  Off milrinone.  Co. ox this morning was 49, repeat Coox and CVP measurement are pending.  Blood pressures were soft yesterday evening, scheduled albumin ordered and BP is more stable.  She remains in sinus rhythm .  Anticoagulating with tissue  mitral valve.  INR is 1.5 today, Coumadin 4 mg p.o. today. ? ?-Bilateral pleural effusions, left effusion appears to be resolving.  Still has a small to moderate right effusion.  We will ask interventional radiology to ultrasound and drain today if appropriate. ? ?-Hypokalemia-60 mEq replacement ordered for today. ? ?-DVT prophylaxis-on daily enoxaparin ? ?-Endo-no history of diabetes.  Glucose well controlled with sliding scale and Levemir.  History of hypothyroidism, replacement resumed. ? ?-GI-tolerating p.o.'s and having appropriate bowel function. ? ?-Renal-normal renal function at baseline.  Weight at baseline.  May be volume contracted.  CVP pending. ? ?Antony Odea, PA-C ?02/13/2022 8:19 AM ?Patient seen and examined, agree with above ?No complaints. Nausea better. ?Co-ox 65 on repeat ? ?Revonda Standard Roxan Hockey, MD ?[facility] ?[phone] ?

## 2022-02-13 NOTE — Progress Notes (Signed)
? ?   ?  [address] ?      [place] [address] ?            [phone]   ? ?  ?No complaints ? ?BP (!) 104/55   Pulse (!) 107   Temp 97.9 ?F (36.6 ?C) (Oral)   Resp (!) 21   Ht '5\' 4"'$  (1.626 m)   Wt 59.7 kg   SpO2 (!) 83%   BMI 22.59 kg/m?  ? ?Intake/Output Summary (Last 24 hours) at 02/13/2022 1650 ?Last data filed at 02/13/2022 1400 ?Gross per 24 hour  ?Intake 252.03 ml  ?Output 1400 ml  ?Net -1147.97 ml  ? ?CVP =14 ? ?Not enough fluid to drain when eval by IR ? ?Continue current Rx ? ?Revonda Standard. Roxan Hockey, MD ?[facility] ?[phone] ?

## 2022-02-13 NOTE — Progress Notes (Signed)
?  Inpatient Rehabilitation Admissions Coordinator  ? ?I met with patient and spouse at bedside to review estimated cost of care for [facility] if [employer] approves. Auth determination is still pending. We will follow up once determination from [facility] is made. ? ?Danne Baxter, RN, MSN ?Rehab Admissions Coordinator ?[phone] ?02/13/2022 11:42 AM ? ?

## 2022-02-13 NOTE — H&P (Incomplete)
? ? ?Physical Medicine and Rehabilitation Admission H&P ? ?  ?Chief Complaint  ?Patient presents with  ? Debility/functional deficits  ? ?HPI:  Tonya Hamilton is a 76 year old female with history of HTN, colon polyps, chronic/frequent UTIs-on bactrim, hypothyroidism who was admitted on 02/03/22 with SOB due to acute hypoxic respiratory failure due to CAP, acute systolic/diastolic heart failure, diffuse ST depression with elevated trops due to NSTEMI and cardiogenic shock. She was started on IV antibiotics and 2/d echo showed EF 30-35% with severe MVR. Cardiac cath done revealing severe CAD and Left main disease. She was intubated, had IABP placed. She underwent CABG X 4 with MVR by Dr. Cyndia Bent on 02/06/22. Post op on pressors and milrinone per Dr. Haroldine Laws. She tolerated extubation by 04/08 but had issues with fluid overload as well as lethargy felt to be due to narcotics. A Flutter converted with amiodarone and midodrine added to wean off NE.  ? ?Follow up echo 04/12 showed large density in LA cavity felt to be artifact. She continues to be hypotensive treated with IV albumin and has been transitioned to po amiodarone as of 04/13. Has completed antibiotic course and  WBC trending down. AKI has resolved. Nausea vomiting treated with course of reglan and scoplamine patch added.  ? ?Interventional radiology consulted for input on right effusion and  ? ?Hyperglycemia being managed with SSI.  ? ? ? ?ROS ? ? ?Past Medical History:  ?Diagnosis Date  ? Arthritis   ? Colon polyps   ? Diverticulitis   ? Hyperlipidemia   ? Hypertension   ? Hypothyroidism   ? PONV (postoperative nausea and vomiting)   ? Thyroid disease   ? UTI (urinary tract infection)   ? ? ?Past Surgical History:  ?Procedure Laterality Date  ? ABDOMINAL HYSTERECTOMY  1993  ? TAH, endometriosis  ? CHOLECYSTECTOMY  2002  ? CORONARY ARTERY BYPASS GRAFT N/A 02/06/2022  ? Procedure: CORONARY ARTERY BYPASS GRAFTING X4, Using Left and Right Greater Saphenous Vein  Harvested Endoscopically;  Surgeon: [name], MD;  Location: [facility];  Service: Open Heart Surgery;  Laterality: N/A;  ? IABP INSERTION N/A 02/03/2022  ? Procedure: IABP Insertion;  Surgeon: Leonie Man, MD;  Location: [facility];  Service: Cardiovascular;  Laterality: N/A;  ? MITRAL VALVE REPLACEMENT N/A 02/06/2022  ? Procedure: MITRAL VALVE (MV) REPLACEMENT Using 25MM Mitris Valve;  Surgeon: [name], MD;  Location: [facility] [facility];  Service: Open Heart Surgery;  Laterality: N/A;  ? RIGHT/LEFT HEART CATH AND CORONARY ANGIOGRAPHY N/A 02/03/2022  ? Procedure: RIGHT/LEFT HEART CATH AND CORONARY ANGIOGRAPHY;  Surgeon: Leonie Man, MD;  Location: [facility];  Service: Cardiovascular;  Laterality: N/A;  ? TEE WITHOUT CARDIOVERSION N/A 02/06/2022  ? Procedure: TRANSESOPHAGEAL ECHOCARDIOGRAM (TEE);  Surgeon: [name], MD;  Location: [facility];  Service: Open Heart Surgery;  Laterality: N/A;  ? TOTAL HIP ARTHROPLASTY Right 05/31/2013  ? Procedure: RIGHT TOTAL HIP ARTHROPLASTY ANTERIOR APPROACH;  Surgeon: Gearlean Alf, MD;  Location: [facility];  Service: Orthopedics;  Laterality: Right;  ? ? ?Family History  ?Problem Relation Age of Onset  ? Arthritis Mother   ? Hypertension Mother   ? Heart disease Mother   ? Hypertension Father   ? Stroke Father   ? Heart disease Father   ? Heart Problems Maternal Grandfather   ? Breast cancer Neg Hx   ? ? ?Social History:  Married. Independent PTA. She  reports that she  has never smoked. She has never used smokeless tobacco. She reports that she does not drink alcohol and does not use drugs. ? ? ?Allergies  ?Allergen Reactions  ? Botox [Onabotulinumtoxina]   ?  Was given too much botox and at night, felt like her throat was closing up.   ? Diclofenac Other (See Comments)  ?  Unknown reaction per pt and SO  ? Lisinopril Swelling  ? Simvastatin Other (See Comments)  ?  myalgias  ? Statins Other (See Comments)  ?  myalgias  ? Sulfa Antibiotics Itching  ? ?Medications  Prior to Admission  ?Medication Sig Dispense Refill  ? amLODipine (NORVASC) 5 MG tablet TAKE 1 TABLET BY MOUTH EVERY DAY (Patient taking differently: Take 5 mg by mouth daily.) 30 tablet 2  ? betamethasone dipropionate 0.05 % cream Apply to affected rash two times daily no longer than 2 weeks of continuous rash. (Patient taking differently: Apply 1 application. topically 2 (two) times daily. Apply to right hand) 30 g 1  ? bismuth subsalicylate (PEPTO BISMOL) 262 MG/15ML suspension Take 30 mLs by mouth every 6 (six) hours as needed for indigestion or diarrhea or loose stools.    ? fluticasone (FLONASE) 50 MCG/ACT nasal spray Place 1-2 sprays into both nostrils daily as needed for allergies or rhinitis.    ? ibuprofen (ADVIL) 200 MG tablet Take 400 mg by mouth every 6 (six) hours as needed for headache or mild pain.    ? levothyroxine (SYNTHROID) 25 MCG tablet TAKE 1 TABLET BY MOUTH EVERY DAY (Patient taking differently: Take 25 mcg by mouth daily before breakfast.) 30 tablet 0  ? ondansetron (ZOFRAN ODT) 4 MG disintegrating tablet Take 1 tablet (4 mg total) by mouth every 8 (eight) hours as needed for nausea or vomiting. 10 tablet 0  ? pantoprazole (PROTONIX) 40 MG tablet Take 40 mg by mouth daily.    ? trimethoprim (TRIMPEX) 100 MG tablet TAKE 1 TABLET BY MOUTH EVERY DAY (Patient taking differently: Take 100 mg by mouth daily.) 90 tablet 1  ? cephALEXin (KEFLEX) 500 MG capsule Take 1 capsule (500 mg total) by mouth 3 (three) times daily. (Patient not taking: Reported on 02/03/2022) 15 capsule 0  ? ciclopirox (LOPROX) 0.77 % cream Apply topically 2 (two) times daily. (Patient not taking: Reported on 02/03/2022) 15 g 1  ? ? ? ? ?Home: ?Home Living ?Family/patient expects to be discharged to:: Private residence ?Living Arrangements: Spouse/significant other ?Available Help at Discharge: Family ?Type of Home: House ?Home Access: Stairs to enter ?Entrance Stairs-Number of Steps: 1 ?Home Layout: Able to live on main level  with bedroom/bathroom ?Bathroom Shower/Tub: Walk-in shower ?Bathroom Toilet: Handicapped height ?Home Equipment: Kasandra Knudsen - single point ?  ?Functional History: ?Prior Function ?Prior Level of Function : Independent/Modified Independent ? ?Functional Status:  ?Mobility: ?Bed Mobility ?General bed mobility comments: OOB in chair ?Transfers ?Overall transfer level: Needs assistance ?Equipment used: Rolling Korson (2 wheels) ?Transfers: Sit to/from Stand ?Sit to Stand: Min assist ?General transfer comment: Min A for weight shift forward and correcting posterior lean ?Ambulation/Gait ?Ambulation/Gait assistance: Min assist, +2 safety/equipment ?Gait Distance (Feet): 30 Feet ?Assistive device: Rolling Collet (2 wheels) ?Gait Pattern/deviations: Step-through pattern, Decreased stride length, Narrow base of support ?General Gait Details: cues for larger step lengths, wider BOS, minA for balance and close chair follow utilized ?Gait velocity: decreased ?Gait velocity interpretation: <1.8 ft/sec, indicate of risk for recurrent falls ?  ? ?ADL: ?ADL ?Overall ADL's : Needs assistance/impaired ?Eating/Feeding: Minimal assistance, Sitting ?  Eating/Feeding Details (indicate cue type and reason): While seated in recliner, pt able to bring dinner roll to her mouth with BUEs ?Grooming: Minimal assistance, Sitting ?Upper Body Bathing: Maximal assistance, Sitting ?Lower Body Bathing: Maximal assistance, Sit to/from stand ?Upper Body Dressing : Maximal assistance, Sitting ?Lower Body Dressing: Moderate assistance, Sit to/from stand ?Lower Body Dressing Details (indicate cue type and reason): Unable to perform figure four and requiring Mod A for intiating donning of underwear. Once in standing, pt requiring Min A-Mod A for posterior lean and then able to pull underwear over hips with cues for compensatory techniques ?Toilet Transfer: Moderate assistance (simualted to recliner) ?Toileting- Clothing Manipulation and Hygiene: Maximal  assistance, +2 for safety/equipment, Sit to/from stand ?Toileting - Clothing Manipulation Details (indicate cue type and reason): Max A for peri care with second person to assist in maintaining standing ?Functiona

## 2022-02-13 NOTE — Discharge Summary (Signed)
Physician Discharge Summary  ?Patient ID: ?Tonya Hamilton ?MRN: [id number] ?DOB/AGE: [birth year] 76 y.o. ? ?Admit date: 02/02/2022 ?Discharge date: 02/19/2022 ? ?Admission Diagnoses: ?Severe mitral regurgitation ?Ischemic cardiomyopathy ?Multivessel coronary artery disease ?Acute respiratory failure ?Non-ST elevation myocardial infarction ?Cardiogenic shock ?Community-acquired pneumonia ?Hypothyroidism ? ?Discharge Diagnoses:  ? ?Admission Diagnoses: ?Severe mitral regurgitation ?Ischemic cardiomyopathy ?Multivessel coronary artery disease ?Acute hypoxemic respiratory failure ?Non-ST elevation myocardial infarction ?Cardiogenic shock ?Community-acquired pneumonia ?Hypothyroidism ?Multivessel coronary artery disease ?Status post CABG x4 and bioprosthetic mitral valve replacement ?Expected acute blood loss anemia ?Acute kidney injury due to acute tubular necrosis/shock ? ? ?Discharged Condition: stable ? ?History of Present Illness: ?  ?The patient is a 76 year old woman with a history of hypertension, hyperlipidemia, and hypothyroidism who presented to the [facility] ER last night with acute onset of shortness of breath, cough, and feeling poorly.  She was found to be hypoxic on room air with a sat of 88%.  Her BNP was 366 with an elevated at [facility] troponin of 708 increasing to 1454.  She had a leukocytosis of 23.6 K.  Flu and COVID test were negative.  Chest x-ray showed diffuse reticulonodular interstitial disease throughout the lungs which was felt to possibly be an atypical infection or chronic lung disease.  Electrocardiogram showed sinus tachycardia at 108 bpm with global ST depression.  She was put on BiPAP for hypoxia and transferred to [facility] for further evaluation.  She was started on empiric antibiotics for possible community-acquired pneumonia with sepsis.  Her troponin continued increasing to 6513.  Electrocardiogram done today showed ejection fraction of 30 to 35% with akinesis of the inferior and  posterior walls.  There is hypokinesis of the anterolateral wall and entire apex.  There is grade 3 diastolic dysfunction with elevated left atrial pressure.  There is moderately elevated PASP and RVSP estimated at 50 mmHg.  There is severe mitral regurgitation.  There is a large left pleural effusion.  There is no evidence of aortic stenosis or insufficiency.  She was treated with intravenous diuresis with some improvement.  She reportedly has had some symptoms for 2 to 3 weeks that began with some upper back aching and then some cough followed by cute onset of shortness last night when laying down. ?  ?She was taken to the Cath Lab late  afternoon which showed left main and severe three-vessel coronary disease.  PA pressure was elevated at 43/22 with a mean of 32.  PCWP was elevated with a mean of 26 and a V wave of 33 mmHg.  LVEDP was elevated at 28.  Cardiac index was 1.93 suggesting some cardiogenic shock.  She had an intra-aortic balloon pump placed and was intubated due to acute hypoxic respiratory failure.  She was started on norepinephrine for blood pressure support.  Dr. Cyndia Hamilton was called to the catheterization lab for consultation for consideration of surgery.  It was his opinion that she should undergo further medical stabilization to further maximize her diuresis.  Additionally the advanced heart failure team assisted with optimizing cardiac function preoperatively.   ? ?Hospital Course: ?Per Tonya Cutter PA-C: ?Following admission and testing as described above she was medically optimized with the assistance of the advanced heart failure and critical care medicine teams.  She was felt to be stable enough to proceed and on 02/06/2022 she was taken the operating room at which time she underwent coronary artery bypass grafting x4 as well as mitral valve replacement.  She tolerated this procedure and was  returned to the surgical intensive care unit in critical condition.  The patient was weaned and  extubated on POD #1.  Her IABP was also weaned and removed without difficulty.  She required additional support with Milrinone and Levophed.  These were weaned as hemodynamics allowed.  She was followed closely by the advanced heart failure team. She was started on Lasix for hypervolemia.  Her chest tubes were removed without difficulty.  She was initiated on coumadin for her Mitral Valve, this would need to be continued for 3 months.  By the third postoperative day, she was weaned off of the milrinone.  She remained on norepinephrine and Lasix continuous infusion.  She developed atrial flutter during the night on postop day 3.  Rapid atrial pacing was attempted but unable to achieve conversion.  He was started on IV amiodarone loading.  The norepinephrine was weaned slowly.  Chest x-ray showed bilateral pleural effusions.  These were followed with periodic chest x-rays. ?Tonya Hamilton was evaluated by the physical therapy team on postop day 4 and acute inpatient rehab was recommended as her eventual disposition. Anticoagulation was initiated with Coumadin for the prosthetic mitral valve. The INR was monitored daily. The epicardial pacer wires were remove on the 5th post-op day. She developed a low-grade fever and leukocytosis. There wee no signs of infection clinically. The invasive lines were removed or changed and pulmonary hyiene was encouraged. . The WBC trended down.  ?By the 7th post-op day, the oxygenhad been weaned to room air and she tolerated this well. CoOx was 64 off all inotropes and vasoactive drips. We requested interventional radiology to ultrasound the right pleural effusion and drain if indicated.  ?There was not enough fluid to drain.  ? ?Addendum: ?Advanced heart failure followed post op and monitored co ox. Patient was started on Midodrine for labile BP. She was diuresed with Demadex. She was deconditioned and PT assisted with mobility. Per advanced heart failure, volume status improved so Demadex  was stopped and she was continued on Spironolactone. She was surgically stable for transfer from the ICU to [facility] for further convalescence on 04/18. She continued to make steady progress. Zosyn was finished on 04/20. As discussed with Dr. Cyndia Hamilton, she will be given 5 days of Augmentin (likely pulmonary source as BC and UC negaitve and no sign of wound infection). INR is up to 2.4 this am so she will be discharged on 3 mg of Coumadin. She will need a PT/INR on Monday 04/24 which AHF should arrange. She did develop diarrhea so we stopped Colchicine. As discussed with Dr. Cyndia Hamilton, Midodrine was decreased to 2.5 mg bid at discharge. Advanced heart failure started Farxiga 10 mg daily today as well. AHF needs to arrange for follow up appointment and 6 week echo. She is surgically stable for discharge today. ? ?Consults:  Advanced heart failure ? ?Significant Diagnostic Studies:  ?Narrative & Impression  ?CLINICAL DATA:  Pneumonia. ?  ?EXAM: ?CT CHEST WITHOUT CONTRAST ?  ?TECHNIQUE: ?Multidetector CT imaging of the chest was performed following the ?standard protocol without IV contrast. ?  ?RADIATION DOSE REDUCTION: This exam was performed according to the ?departmental dose-optimization program which includes automated ?exposure control, adjustment of the mA and/or kV according to ?patient size and/or use of iterative reconstruction technique. ?  ?COMPARISON:  Chest x-ray from same day. ?  ?FINDINGS: ?Cardiovascular: Mild cardiomegaly status post CABG and mitral valve ?replacement. No pericardial effusion. No thoracic aortic aneurysm. ?Coronary, aortic arch, and branch vessel atherosclerotic vascular ?disease. ?  ?  Mediastinum/Nodes: No enlarged mediastinal or axillary lymph nodes. ?Thyroid gland, trachea, and esophagus demonstrate no significant ?findings. ?  ?Lungs/Pleura: Small pleural effusions, slightly greater on the ?right. Adjacent volume loss and right greater than left lower lobe ?atelectasis. No consolidation or  pneumothorax. Small cluster of ?peribronchovascular nodularity in the superior segment of the right ?lower lobe (series 5, image 92). ?  ?Upper Abdomen: No acute abnormality. ?  ?Musculoskeletal: No acute or

## 2022-02-13 NOTE — Progress Notes (Signed)
Physical Therapy Treatment ?Patient Details ?Name: Tonya Hamilton ?MRN: [id number] ?DOB: [birth year] ?Today's Date: 02/13/2022 ? ? ?History of Present Illness 76 y.o. F who presents 4/3 with dyspnea. Echo with reduced EF and regional wall motion abnormalities with severe MR, cardiology consulted and taken to cath lab which revealed severe LM and 3V disease.  IABP placed and intubated, in cardiogenic shock. S/p CABG and MVR 4/7. Significant PMH: diverticulitis, OA, HTN, HLD, osteopenia, anxiety. ? ?  ?PT Comments  ? ? Pt is making great progress with mobility, ambulating up to ~310 ft today. Pt did display a tendency to drift to the L inside the RW initially, but improved with cues. Pt only required min guard assist for safety when ambulating with RW, but when attempting to ambulate without RW she displayed a LOB secondary to poor feet clearance, resulting in her needing minA to recover. If pt continues to display consistent, good progress she may be able to go home with HHPT instead. Will continue to recommend AIR right now as she is still functioning significantly below her baseline and is at risk for falls. Will continue to follow acutely. ? ?   ?Recommendations for follow up therapy are one component of a multi-disciplinary discharge planning process, led by the attending physician.  Recommendations may be updated based on patient status, additional functional criteria and insurance authorization. ? ?Follow Up Recommendations ? Acute inpatient rehab (3hours/day) (vs HHPT pending pt's functional consistency) ?  ?  ?Assistance Recommended at Discharge Frequent or constant Supervision/Assistance  ?Patient can return home with the following A little help with walking and/or transfers;A little help with bathing/dressing/bathroom;Assist for transportation;Help with stairs or ramp for entrance;Assistance with cooking/housework ?  ?Equipment Recommendations ? Rolling [place] (2 wheels);BSC/3in1  ?  ?Recommendations for  Other Services   ? ? ?  ?Precautions / Restrictions Precautions ?Precautions: Sternal;Fall;Other (comment) ?Precaution Booklet Issued: No ?Precaution Comments: Watch BP, O2 ?Restrictions ?Weight Bearing Restrictions: Yes ?Other Position/Activity Restrictions: sternal precautions  ?  ? ?Mobility ? Bed Mobility ?Overal bed mobility: Needs Assistance ?Bed Mobility: Supine to Sit ?  ?  ?Supine to sit: Min guard, HOB elevated ?  ?  ?General bed mobility comments: Extra time, cues to avoid pushing through UEs, HOB elevated, min guard for safety ?  ? ?Transfers ?Overall transfer level: Needs assistance ?Equipment used: Rolling Pritz (2 wheels), None ?Transfers: Sit to/from Stand ?Sit to Stand: Min assist, Min guard ?  ?  ?  ?  ?  ?General transfer comment: MinA the first x2 reps, needing reminders not to reach to pull on RW but rather keep UEs on lap. Pt able to progress to only needing min guard assist to come to stand without AD when provided demonstration and cues for feet and hand placement and rocking to gain momentum via bringing "nose over toes". ?  ? ?Ambulation/Gait ?Ambulation/Gait assistance: Min guard, Min assist ?Gait Distance (Feet): 310 Feet ?Assistive device: Rolling Carbone (2 wheels), None ?Gait Pattern/deviations: Step-through pattern, Decreased stride length, Decreased dorsiflexion - right, Decreased dorsiflexion - left, Drifts right/left ?Gait velocity: reduced ?Gait velocity interpretation: 1.31 - 2.62 ft/sec, indicative of limited community ambulator ?  ?General Gait Details: Ambulated initial ~290 ft with RW at min guard level, no LOB. Pt with good upright posture, but tendency to drift to the L inside the RW. This may have been due to PT being on her R and pt trying to give space. Improved correction when cued. Cues provided to also increase feet clearance,  mod momentary success. Progressed to ambulating final ~20ish ft without UE support, but pt dragging toes and with LOB, needing minA to recover.  Educated pt and husband on her risk for falls and need for RW at this time. ? ? ?Stairs ?  ?  ?  ?  ?  ? ? ?Wheelchair Mobility ?  ? ?Modified Rankin (Stroke Patients Only) ?  ? ? ?  ?Balance Overall balance assessment: Needs assistance ?Sitting-balance support: Feet supported ?Sitting balance-Leahy Scale: Good ?Sitting balance - Comments: Supervision for safety sitting statically EOB ?  ?Standing balance support: Bilateral upper extremity supported, No upper extremity supported, During functional activity ?Standing balance-Leahy Scale: Fair ?Standing balance comment: Able to stand without UE support, but LOB with gait without UE support needing minA to recover. Benefits from RW for mobility ?  ?  ?  ?  ?  ?  ?  ?  ?  ?  ?  ?  ? ?  ?Cognition Arousal/Alertness: Awake/alert ?Behavior During Therapy: [facility] for tasks assessed/performed ?Overall Cognitive Status: Impaired/Different from baseline ?Area of Impairment: Memory, Following commands, Problem solving ?  ?  ?  ?  ?  ?  ?  ?  ?  ?  ?Memory: Decreased recall of precautions, Decreased short-term memory ?Following Commands: Follows multi-step commands with increased time ?  ?  ?Problem Solving: Difficulty sequencing, Requires verbal cues ?General Comments: Pt able to recall no pushing or pulling for sternal precautions. Pt needing repeated reminders and extra time to process multi-step cues for proper set-up for transfers. ?  ?  ? ?  ?Exercises Other Exercises ?Other Exercises: sit <> stand from recliner 5x ? ?  ?General Comments General comments (skin integrity, edema, etc.): Poor SpO2 waveforms on RA during gait with readings of 70s% but no change in pt presentation, improved to 90s% with good waveforms ?  ?  ? ?Pertinent Vitals/Pain Pain Assessment ?Pain Assessment: Faces ?Faces Pain Scale: Hurts a little bit ?Pain Location: sternum ?Pain Descriptors / Indicators: Discomfort, Operative site guarding ?Pain Intervention(s): Limited activity within patient's  tolerance, Monitored during session, Repositioned  ? ? ?Home Living   ?  ?  ?  ?  ?  ?  ?  ?  ?  ?   ?  ?Prior Function    ?  ?  ?   ? ?PT Goals (current goals can now be found in the care plan section) Acute Rehab PT Goals ?Patient Stated Goal: to get better and go home if able ?PT Goal Formulation: With patient/family ?Time For Goal Achievement: 02/24/22 ?Potential to Achieve Goals: Good ?Progress towards PT goals: Progressing toward goals ? ?  ?Frequency ? ? ? Min 3X/week ? ? ? ?  ?PT Plan Discharge plan needs to be updated  ? ? ?Co-evaluation   ?  ?  ?  ?  ? ?  ?AM-PAC PT "6 Clicks" Mobility   ?Outcome Measure ? Help needed turning from your back to your side while in a flat bed without using bedrails?: A Little ?Help needed moving from lying on your back to sitting on the side of a flat bed without using bedrails?: A Little ?Help needed moving to and from a bed to a chair (including a wheelchair)?: A Little ?Help needed standing up from a chair using your arms (e.g., wheelchair or bedside chair)?: A Little ?Help needed to walk in hospital room?: A Little ?Help needed climbing 3-5 steps with a railing? : A Lot ?6 Click Score:  17 ? ?  ?End of Session   ?Activity Tolerance: Patient tolerated treatment well ?Patient left: in chair;with call bell/phone within reach;with family/visitor present ?Nurse Communication: Mobility status ?PT Visit Diagnosis: Unsteadiness on feet (R26.81);Muscle weakness (generalized) (M62.81);Difficulty in walking, not elsewhere classified (R26.2);Other abnormalities of gait and mobility (R26.89) ?  ? ? ?Time: 2583-4621 ?PT Time Calculation (min) (ACUTE ONLY): 21 min ? ?Charges:  $Gait Training: 8-22 mins          ?          ? ?Moishe Spice, PT, DPT ?Acute Rehabilitation Services  ?Pager: [phone] ?Office: [phone] ? ? ? ?Maretta Bees Pettis ?02/13/2022, 3:14 PM ? ?

## 2022-02-13 NOTE — TOC Initial Note (Signed)
Transition of Care (TOC) - Initial/Assessment Note  ? ? ?Patient Details  ?Name: Tonya Hamilton ?MRN: [id number] ?Date of Birth: 28-Jun-1946 ? ?Transition of Care [facility]) CM/SW Contact:    ?Marcheta Grammes Rexene Alberts, RN ?Phone Number: [phone] ?02/13/2022, 11:12 AM ? ?Clinical Narrative:                 ? ?HF TOC CM spoke to pt and husband at bedside. Pt and husband agreeable to IP rehab. Pt has cane. Husband is wanting a 3n1 bedside commode at home. Pt may also need RW. Explained IP rehab CSW will get all DME prior to dc and [place] set up if needed. Husband at home to assist with care. Waiting Holli Humbles for IP rehab. Pt has a Long term Care policy and husband will check on plan benefits.  ? ? ? ?Expected Discharge Plan: [place] ?Barriers to Discharge: Continued Medical Work up ? ? ?Patient Goals and CMS Choice ?Patient states their goals for this hospitalization and ongoing recovery are:: goal is to get better and go home ?CMS Medicare.gov Compare Post Acute Care list provided to:: Patient ?Choice offered to / list presented to : Patient ? ?Expected Discharge Plan and Services ?Expected Discharge Plan: [place] ?  ?Discharge Planning Services: CM Consult ?Post Acute Care Choice: IP Rehab ?Living arrangements for the past 2 months: [place] ?                ?  ?  ?  ?  ?  ?  ?  ?  ?  ?  ? ?Prior Living Arrangements/Services ?Living arrangements for the past 2 months: [place] ?Lives with:: Spouse ?  ?Do you feel safe going back to the place where you live?: Yes      ?Need for Family Participation in Patient Care: Yes (Comment) ?Care giver support system in place?: Yes (comment) ?Current home services: (S) DME (cane, monitoring system, Long term care policy) ?Criminal Activity/Legal Involvement Pertinent to Current Situation/Hospitalization: No - Comment as needed ? ?Activities of Daily Living ?Home Assistive Devices/Equipment: None ?ADL Screening (condition at time of  admission) ?Patient's cognitive ability adequate to safely complete daily activities?: Yes ?Is the patient deaf or have difficulty hearing?: No ?Does the patient have difficulty seeing, even when wearing glasses/contacts?: No ?Does the patient have difficulty concentrating, remembering, or making decisions?: No ?Patient able to express need for assistance with ADLs?: Yes ?Does the patient have difficulty dressing or bathing?: No ?Independently performs ADLs?: Yes (appropriate for developmental age) ?Does the patient have difficulty walking or climbing stairs?: No ?Weakness of Legs: None ?Weakness of Arms/Hands: None ? ?Permission Sought/Granted ?Permission sought to share information with : Case Manager, Family Supports, PCP ?Permission granted to share information with : Yes, Verbal Permission Granted ? Share Information with NAME: Eliane Hammersmith ? Permission granted to share info w AGENCY: IP rehab ? Permission granted to share info w Relationship: husband ? Permission granted to share info w Contact Information: [phone] ? ?Emotional Assessment ?Appearance:: Appears stated age ?Attitude/Demeanor/Rapport: Gracious ?Affect (typically observed): Accepting ?Orientation: : Oriented to Self, Oriented to Place, Oriented to  Time, Oriented to Situation ?  ?Psych Involvement: No (comment) ? ?Admission diagnosis:  Acute respiratory failure ([place]) [J96.00] ?Respiratory failure ([place]) [J96.90] ?Non-STEMI (non-ST elevated myocardial infarction) ([place]) [I21.4] ?Acute respiratory failure with hypoxia ([place]) [J96.01] ?Community acquired pneumonia, unspecified laterality [J18.9] ?S/P CABG x 4 [Z95.1] ?Patient Active Problem List  ? Diagnosis Date Noted  ?  S/P CABG x 4 02/06/2022  ? Acute respiratory failure ([place]) 02/03/2022  ? Respiratory failure ([place]) 02/03/2022  ? Community acquired pneumonia   ? Non-STEMI (non-ST elevated myocardial infarction) ([place])   ? Cardiogenic shock ([place])   ? Severe mitral regurgitation   ? Ischemic  cardiomyopathy   ? Multiple vessel coronary artery disease   ? Chronic intractable headache 11/27/2019  ? Occipital neuralgia of left side 11/27/2019  ? Neck pain 11/27/2019  ? Hyperreflexia 11/27/2019  ? Arthritis, multiple joint involvement 11/27/2019  ? Spasmodic dysphonia 11/27/2019  ? Angioedema 11/06/2014  ? Osteoarthritis of right hip 02/20/2013  ? History of colonic diverticulitis 11/15/2012  ? Osteoarthritis 11/15/2012  ? Hypertension 11/15/2012  ? Hyperlipidemia 11/15/2012  ? Hypothyroid 11/15/2012  ? GERD (gastroesophageal reflux disease) 11/15/2012  ? Osteopenia 11/15/2012  ? Anxiety 11/15/2012  ? ?PCP:  [name], MD ?Pharmacy:   ?[id number] [place] [facility] [id number]?Phone: [phone]: [phone]? ? ? ? ?Social Determinants of Health (SDOH) Interventions ?  ? ?Readmission Risk Interventions ?   ? View : No data to display.  ?  ?  ?  ? ? ? ?

## 2022-02-14 LAB — CBC
HCT: 27.6 % — ABNORMAL LOW (ref 36.0–46.0)
Hemoglobin: 9 g/dL — ABNORMAL LOW (ref 12.0–15.0)
MCH: 28.5 pg (ref 26.0–34.0)
MCHC: 32.6 g/dL (ref 30.0–36.0)
MCV: 87.3 fL (ref 80.0–100.0)
Platelets: 730 10*3/uL — ABNORMAL HIGH (ref 150–400)
RBC: 3.16 MIL/uL — ABNORMAL LOW (ref 3.87–5.11)
RDW: 15 % (ref 11.5–15.5)
WBC: 17.9 10*3/uL — ABNORMAL HIGH (ref 4.0–10.5)
nRBC: 0 % (ref 0.0–0.2)

## 2022-02-14 LAB — URINALYSIS, ROUTINE W REFLEX MICROSCOPIC
Bilirubin Urine: NEGATIVE
Glucose, UA: NEGATIVE mg/dL
Hgb urine dipstick: NEGATIVE
Ketones, ur: NEGATIVE mg/dL
Leukocytes,Ua: NEGATIVE
Nitrite: NEGATIVE
Protein, ur: NEGATIVE mg/dL
Specific Gravity, Urine: 1.012 (ref 1.005–1.030)
pH: 8 (ref 5.0–8.0)

## 2022-02-14 LAB — COOXEMETRY PANEL
Carboxyhemoglobin: 2.1 % — ABNORMAL HIGH (ref 0.5–1.5)
Carboxyhemoglobin: 1.5 % (ref 0.5–1.5)
Methemoglobin: <0.7 % (ref 0.0–1.5)
Methemoglobin: 0.7 % (ref 0.0–1.5)
O2 Saturation: 53.1 %
O2 Saturation: 47.9 %
Total hemoglobin: 8.8 g/dL — ABNORMAL LOW (ref 12.0–16.0)
Total hemoglobin: 10.5 g/dL — ABNORMAL LOW (ref 12.0–16.0)

## 2022-02-14 LAB — GLUCOSE, CAPILLARY
Glucose-Capillary: 113 mg/dL — ABNORMAL HIGH (ref 70–99)
Glucose-Capillary: 125 mg/dL — ABNORMAL HIGH (ref 70–99)
Glucose-Capillary: 141 mg/dL — ABNORMAL HIGH (ref 70–99)
Glucose-Capillary: 142 mg/dL — ABNORMAL HIGH (ref 70–99)
Glucose-Capillary: 181 mg/dL — ABNORMAL HIGH (ref 70–99)
Glucose-Capillary: 99 mg/dL (ref 70–99)

## 2022-02-14 LAB — BASIC METABOLIC PANEL
Anion gap: 10 (ref 5–15)
BUN: 22 mg/dL (ref 8–23)
CO2: 35 mmol/L — ABNORMAL HIGH (ref 22–32)
Calcium: 8.7 mg/dL — ABNORMAL LOW (ref 8.9–10.3)
Chloride: 91 mmol/L — ABNORMAL LOW (ref 98–111)
Creatinine, Ser: 1.05 mg/dL — ABNORMAL HIGH (ref 0.44–1.00)
GFR, Estimated: 55 mL/min — ABNORMAL LOW (ref >60)
Glucose, Bld: 109 mg/dL — ABNORMAL HIGH (ref 70–99)
Potassium: 4 mmol/L (ref 3.5–5.1)
Sodium: 136 mmol/L (ref 135–145)

## 2022-02-14 LAB — PROTIME-INR
INR: 1.3 — ABNORMAL HIGH (ref 0.8–1.2)
Prothrombin Time: 16.2 seconds — ABNORMAL HIGH (ref 11.4–15.2)

## 2022-02-14 LAB — MAGNESIUM: Magnesium: 2.3 mg/dL (ref 1.7–2.4)

## 2022-02-14 MED ORDER — WARFARIN SODIUM 5 MG PO TABS: 5.0000 mg | ORAL_TABLET | Freq: Every day | ORAL | Status: DC

## 2022-02-14 MED ORDER — ~~LOC~~ CARDIAC SURGERY, PATIENT & FAMILY EDUCATION: Freq: Once | Status: DC

## 2022-02-14 MED ORDER — SODIUM CHLORIDE 0.9% FLUSH: 3.0000 mL | INTRAVENOUS | Status: DC | PRN

## 2022-02-14 MED ORDER — MIDODRINE HCL 5 MG PO TABS
2.5000 mg | ORAL_TABLET | Freq: Three times a day (TID) | ORAL | Status: DC
  Administered 2022-02-14 – 2022-02-19 (×15): 2.5 mg via ORAL
  Filled 2022-02-14 (×15): qty 1

## 2022-02-14 MED ORDER — SPIRONOLACTONE 12.5 MG HALF TABLET
12.5000 mg | ORAL_TABLET | Freq: Every day | ORAL | Status: DC
  Administered 2022-02-14 – 2022-02-18 (×5): 12.5 mg via ORAL
  Filled 2022-02-14 (×5): qty 1

## 2022-02-14 MED ORDER — CHLORHEXIDINE GLUCONATE CLOTH 2 % EX PADS
6.0000 | MEDICATED_PAD | Freq: Every day | CUTANEOUS | Status: DC
  Administered 2022-02-15 – 2022-02-17 (×3): 6 via TOPICAL

## 2022-02-14 MED ORDER — BISMUTH SUBSALICYLATE 262 MG/15ML PO SUSP
30.0000 mL | Freq: Four times a day (QID) | ORAL | Status: DC | PRN
  Filled 2022-02-14: qty 236

## 2022-02-14 MED ORDER — SODIUM CHLORIDE 0.9 % IV SOLN: 250.0000 mL | INTRAVENOUS | Status: DC | PRN

## 2022-02-14 MED ORDER — WARFARIN SODIUM 5 MG PO TABS
5.0000 mg | ORAL_TABLET | Freq: Every day | ORAL | Status: DC
  Administered 2022-02-14 – 2022-02-18 (×5): 5 mg via ORAL
  Filled 2022-02-14 (×5): qty 1

## 2022-02-14 MED ORDER — SODIUM CHLORIDE 0.9% FLUSH
3.0000 mL | Freq: Two times a day (BID) | INTRAVENOUS | Status: DC
  Administered 2022-02-15 – 2022-02-17 (×5): 3 mL via INTRAVENOUS

## 2022-02-14 NOTE — Progress Notes (Signed)
? ?   ?  [address] ?      [place] [address] ?            [phone]   ? ?  ?No complaints ?Stable day  ?Awaiting bed on progressive unit ? ?Revonda Standard Roxan Hockey, MD ?[facility] ?[phone] ?

## 2022-02-14 NOTE — Progress Notes (Signed)
?  ? ? Advanced Heart Failure Rounding Note ? ?PCP-Cardiologist: None  ? ?Subjective:   ? ?76 y/o with severe CAD/NSTEMI and iCM EF 30-35%. Developed refractory angina and shock with respiratory failure. Intubated and IABP placed.  ?4/7 CABG + MVR  ?4/8 Extubated, IABP removed  ?04/11 AFL >>converted shortly after starting amio gtt ? ?Feels ok. Denies CP or SOB. Walked halls with assistance. CVP 8-9 Co-ox 53% ? ? ? ?Limited echo 04/12: ?EF 35-40%, RMA, RV okay, mean gradient 7 across mitral valve prosthesis, large density in LA cavity that likely represents artifact  ? ? ?Objective:   ?Weight Range: ?58.4 kg ?Body mass index is 22.1 kg/m?.  ? ?Vital Signs:   ?Temp:  [97.9 ?F (36.6 ?C)-98.4 ?F (36.9 ?C)] 98.4 ?F (36.9 ?C) (04/15 0745) ?Pulse Rate:  [88-107] 98 (04/15 0900) ?Resp:  [14-25] 19 (04/15 0900) ?BP: (97-120)/(46-75) 119/60 (04/15 0900) ?SpO2:  [83 %-98 %] 94 % (04/15 0900) ?Weight:  [58.4 kg] 58.4 kg (04/15 0500) ?Last BM Date : 02/12/22 ? ?Weight change: ?Filed Weights  ? 02/12/22 0637 02/13/22 0500 02/14/22 0500  ?Weight: 58.9 kg 59.7 kg 58.4 kg  ? ? ?Intake/Output:  ? ?Intake/Output Summary (Last 24 hours) at 02/14/2022 1034 ?Last data filed at 02/14/2022 1000 ?Gross per 24 hour  ?Intake 237 ml  ?Output 1275 ml  ?Net -1038 ml  ? ?  ? ? ?Physical Exam  ? ?General:  Sitting in chair. No resp difficulty ?HEENT: normal ?Neck: supple. JVP 8-9 Carotids 2+ bilat; no bruits. No lymphadenopathy or thryomegaly appreciated. ?Cor: PMI nondisplaced. Sternal wound ok Regular rate & rhythm. No rubs, gallops or murmurs. ?Lungs: clear ?Abdomen: soft, nontender, nondistended. No hepatosplenomegaly. No bruits or masses. Good bowel sounds. ?Extremities: no cyanosis, clubbing, rash, edema ?Neuro: alert & orientedx3, cranial nerves grossly intact. moves all 4 extremities w/o difficulty. Affect pleasant ? ?Telemetry  ? ?SR 90-100 Personally reviewed ? ?Labs  ?  ?CBC ?Recent Labs  ?  02/13/22 ?0310 02/14/22 ?0355  ?WBC 15.0*  17.9*  ?HGB 8.0* 9.0*  ?HCT 25.1* 27.6*  ?MCV 88.1 87.3  ?PLT 562* 730*  ? ? ?Basic Metabolic Panel ?Recent Labs  ?  02/13/22 ?1512 02/14/22 ?0355  ?NA 133* 136  ?K 3.9 4.0  ?CL 87* 91*  ?CO2 34* 35*  ?GLUCOSE 146* 109*  ?BUN 25* 22  ?CREATININE 1.10* 1.05*  ?CALCIUM 8.7* 8.7*  ?MG 2.2 2.3  ? ? ?Liver Function Tests ?No results for input(s): AST, ALT, ALKPHOS, BILITOT, PROT, ALBUMIN in the last 72 hours. ? ?No results for input(s): LIPASE, AMYLASE in the last 72 hours. ?Cardiac Enzymes ?No results for input(s): CKTOTAL, CKMB, CKMBINDEX, TROPONINI in the last 72 hours. ? ?BNP: ?BNP (last 3 results) ?Recent Labs  ?  02/02/22 ?2321  ?BNP 366.4*  ? ? ? ?ProBNP (last 3 results) ?No results for input(s): PROBNP in the last 8760 hours. ? ? ?D-Dimer ?No results for input(s): DDIMER in the last 72 hours. ?Hemoglobin A1C ?No results for input(s): HGBA1C in the last 72 hours. ? ?Fasting Lipid Panel ?No results for input(s): CHOL, HDL, LDLCALC, TRIG, CHOLHDL, LDLDIRECT in the last 72 hours. ? ?Thyroid Function Tests ?No results for input(s): TSH, T4TOTAL, T3FREE, THYROIDAB in the last 72 hours. ? ?Invalid input(s): FREET3 ? ? ?Other results: ? ? ?Imaging  ? ? ?IR US CHEST ? ?Result Date: 02/13/2022 ?CLINICAL DATA:  Pleural effusion EXAM: CHEST ULTRASOUND COMPARISON:  None. FINDINGS: Focused sonographic exam of the right chest was performed.  Images demonstrate small volume pleural fluid, not amenable for safe image guided thoracentesis. No intervention performed. IMPRESSION: Small volume right pleural effusion.  No intervention performed. Electronically Signed   By: Albin Felling M.D.   On: 02/13/2022 16:12   ? ? ?Medications:   ? ? ?Scheduled Medications: ? amiodarone  200 mg Oral BID  ? aspirin EC  81 mg Oral Daily  ? bisacodyl  10 mg Oral Daily  ? Or  ? bisacodyl  10 mg Rectal Daily  ? Chlorhexidine Gluconate Cloth  6 each Topical Daily  ? docusate sodium  200 mg Oral Daily  ? enoxaparin (LOVENOX) injection  40 mg  Subcutaneous QHS  ? feeding supplement  237 mL Oral BID BM  ? feeding supplement (GLUCERNA SHAKE)  237 mL Oral BID BM  ? fluticasone  1 spray Each Nare Daily  ? insulin aspart  0-15 Units Subcutaneous TID WC  ? insulin detemir  10 Units Subcutaneous Daily  ? levothyroxine  25 mcg Oral Q0600  ? mouth rinse  15 mL Mouth Rinse BID  ? melatonin  3 mg Oral QHS  ? midodrine  5 mg Oral TID WC  ? multivitamin with minerals  1 tablet Oral Daily  ? pantoprazole  40 mg Oral Daily  ? Ensure Max Protein  11 oz Oral QHS  ? rosuvastatin  5 mg Oral Daily  ? scopolamine  1 patch Transdermal Q72H  ? sodium chloride flush  10-40 mL Intracatheter Q12H  ? sodium chloride flush  10-40 mL Intracatheter Q12H  ? sodium chloride flush  3 mL Intravenous Q12H  ? torsemide  40 mg Oral Daily  ? warfarin  5 mg Oral q1600  ? Warfarin - Physician Dosing Inpatient   Does not apply q1600  ? ? ?Infusions: ? sodium chloride Stopped (02/08/22 0826)  ? sodium chloride    ? sodium chloride Stopped (02/12/22 1734)  ? famotidine (PEPCID) IV Stopped (02/07/22 1703)  ? lactated ringers    ? lactated ringers Stopped (02/09/22 1631)  ? ? ?PRN Medications: ?sodium chloride, Gerhardt's butt cream, oxyCODONE, prochlorperazine, promethazine, simethicone, sodium chloride flush, sodium chloride flush, sodium chloride flush, traMADol ? ? ? ?Patient Profile  ? ?76 y/o with severe CAD/NSTEMI and iCM EF 30-35%. Developed refractory angina and shock yesterday with respiratory failure. Intubated and IABP placed. Taken to OR 4/7 for CABG + MVR  ? ?Assessment/Plan  ? ?1. NSTEMI---> Cardiogenic Shock ?- HS Trop 762>8315> 6513 ?- Cath with 3V disease, elevated wedge, and reduced cardiac output. IABP placed in lab with Norepi/Milrinone.  ?- 4/7 s/p CABG + MVR ?- On ASA 325. Would typically give plavix post NSTEMI, but also on warfarin with recent MVR. ?- Continue statin ?- Cardiac rehab  ?  ?2. Acute Hypoxemic Respiratory Failure ?- Extubated 4/8. ?- stable ? ?3. Acute HFrEF,  ICM  -> cardiogenic shock ?- Echo EF 30-35% RV ok. Severe MR. EF improved on post CABG TEE. ?- Limited echo 004/12: EF 35-40%, RV okay, large density in LA cavity (? Thrombus). Will review with Dr. Haroldine Laws to decide on additional imaging. ?- Now s/p CABG ?- Off Milrinone, NE weaned off 04/13. IABP out ?- Co-ox 53% Repeat pending ?- On midodrine 5 mg TID for BP support. Can decrease to 2.5 TID ?- CVP 8-9 Continue Torsemide 40 mg daily - Consider switch to SGLT2i as she is more mobile Add spiro 12.5 ?- K 4.0 this am. Supp. ?- Titrate GDMT as tolerated ? ?4. Severe MR  ?-  S/p MV replacement with 25 mm Mitris Resilia pericardial valve ?- Warfarin started per TCTS (will be on for 3 months) ?- INR 1.3 ?  ?5. Hypothyroidism  ?-On levothyroxine.  ?  ?6. AKI due to ATN/shock ?- resolved ?- monitor w/ diuresis  ? ?7. Acute blood loss anemia ?- transfuse as needed to keep hgb >= 8.0 ?- Hgb 9.0 ? ?8. Fever ?- suspect atx. Most lines out ?- now resolved  ?- Encourage ongoing use IS ?- WBC remain elevated but trending down, 23>20>23>18>15.  ?- CXR looks stable ?- Procalcitonin 1.38>0.72, 0.31 ? ?9. B/l Pleural Effusions ?- Improved with diuresis ?- lung u/s with IR 4/14 no significant effusion to tap ? ? ?10. Atrial Flutter ?- Currently SR 90s with 1st degree AVB  ?- Drop amio to 200 daily ?- on warfarin, INR 1.3 ? ?Can go to floor. CIR eval in process.  ? ?Length of Stay: 11 ? ?Glori Bickers, MD  ?02/14/2022, 10:34 AM ? ?Advanced Heart Failure Team ?Pager [phone] (M-F; 7a - 5p)  ?Please contact [facility] for night-coverage after hours (5p -7a ) and weekends on [email or web address] ? ?10:34 AM ? ? ?

## 2022-02-14 NOTE — Progress Notes (Signed)
8 Days Post-Op Procedure(s) (LRB): ?CORONARY ARTERY BYPASS GRAFTING X4, Using Left and Right Greater Saphenous Vein Harvested Endoscopically (N/A) ?MITRAL VALVE (MV) REPLACEMENT Using 25MM Mitris Valve (N/A) ?TRANSESOPHAGEAL ECHOCARDIOGRAM (TEE) (N/A) ?Subjective: ?No complaints this AM ? ?Objective: ?Vital signs in last 24 hours: ?Temp:  [97.9 ?F (36.6 ?C)-98.4 ?F (36.9 ?C)] 98.4 ?F (36.9 ?C) (04/15 0745) ?Pulse Rate:  [88-107] 98 (04/15 0700) ?Cardiac Rhythm: Normal sinus rhythm;Sinus tachycardia (04/15 0400) ?Resp:  [14-25] 21 (04/15 0700) ?BP: (97-120)/(46-75) 108/75 (04/15 0700) ?SpO2:  [83 %-98 %] 96 % (04/15 0700) ?Weight:  [58.4 kg] 58.4 kg (04/15 0500) ? ?Hemodynamic parameters for last 24 hours: ?CVP:  [3 mmHg-69 mmHg] 8 mmHg ? ?Intake/Output from previous day: ?04/14 0701 - 04/15 0700 ?In: 9 [P.O.:237] ?Out: 1525 [Urine:1525] ?Intake/Output this shift: ?No intake/output data recorded. ? ?General appearance: alert, cooperative, and no distress ?Neurologic: intact ?Heart: regular rate and rhythm ?Lungs: diminished breath sounds bibasilar R>L ?Abdomen: normal findings: soft, non-tender ? ?Lab Results: ?Recent Labs  ?  02/13/22 ?0310 02/14/22 ?0355  ?WBC 15.0* 17.9*  ?HGB 8.0* 9.0*  ?HCT 25.1* 27.6*  ?PLT 562* 730*  ? ?BMET:  ?Recent Labs  ?  02/13/22 ?1512 02/14/22 ?0355  ?NA 133* 136  ?K 3.9 4.0  ?CL 87* 91*  ?CO2 34* 35*  ?GLUCOSE 146* 109*  ?BUN 25* 22  ?CREATININE 1.10* 1.05*  ?CALCIUM 8.7* 8.7*  ?  ?PT/INR:  ?Recent Labs  ?  02/14/22 ?0355  ?LABPROT 16.2*  ?INR 1.3*  ? ?ABG ?   ?Component Value Date/Time  ? PHART 7.557 (H) 02/10/2022 2585  ? HCO3 38.0 (H) 02/10/2022 2778  ? TCO2 39 (H) 02/10/2022 2423  ? ACIDBASEDEF 3.0 (H) 02/07/2022 1010  ? O2SAT 53.1 02/14/2022 0355  ? ?CBG (last 3)  ?Recent Labs  ?  02/13/22 ?2330 02/14/22 ?0405 02/14/22 ?0740  ?GLUCAP 135* 113* 142*  ? ? ?Assessment/Plan: ?S/P Procedure(s) (LRB): ?CORONARY ARTERY BYPASS GRAFTING X4, Using Left and Right Greater Saphenous Vein  Harvested Endoscopically (N/A) ?MITRAL VALVE (MV) REPLACEMENT Using 25MM Mitris Valve (N/A) ?TRANSESOPHAGEAL ECHOCARDIOGRAM (TEE) (N/A) ?Plan transfer to [place] ?NEURO- intact ?CV- in SR on amiodarone,  ?co-ox this Am 53- followed by AHF team ? Midodrine for low BP ? ASA ? Warfarin- 4 mg yesterday INR only 1,3- will give 5 mg today ?RESP- minimal effusion on [place], still some atelectasis/ effusion at right base ?RENAL- creatinine and lytes OK ? On Demadex ?ENDO- CBG well controlled on levemir + SSI ?GI- tolerating PO, continue supplements ?Leukocytosis- no clear source, afebrile- will check UA, monitor ?Deconditioning- continue PT ? ? LOS: 11 days  ? ? ?Melrose Nakayama ?02/14/2022 ? ? ?

## 2022-02-14 NOTE — Progress Notes (Addendum)
Physical Therapy Treatment ?Patient Details ?Name: Tonya Hamilton ?MRN: [id number] ?DOB: 1945-12-09 ?Today's Date: 02/14/2022 ? ? ?History of Present Illness 76 y.o. F who presents 4/3 with dyspnea. Echo with reduced EF and regional wall motion abnormalities with severe MR, cardiology consulted and taken to cath lab which revealed severe LM and 3V disease.  IABP placed and intubated, in cardiogenic shock. S/p CABG and MVR 4/7. Significant PMH: diverticulitis, OA, HTN, HLD, osteopenia, anxiety. ? ?  ?PT Comments  ? ? Pt is continuing to display consistent, good progress with mobility, ambulating up to ~420 ft and navigating x2 stairs today. However, she remains at risk for falls, displaying a narrow BOS and LOB bouts when ambulating without UE support, requiring minA to recover. Pt able to navigate stairs with min guard assist when using a handrail. Educated pt and her husband on using the gait belt provided to safely hold onto/guard the pt with all standing mobility at home and to use the RW for improved safety with mobility. They verbalized understanding. As pt has made great functional progress and is likely at too high of a functional level for AIR now, updating d/c recs to HHPT. Will continue to follow acutely. Updated PT goals. ? ?  ?Recommendations for follow up therapy are one component of a multi-disciplinary discharge planning process, led by the attending physician.  Recommendations may be updated based on patient status, additional functional criteria and insurance authorization. ? ?Follow Up Recommendations ? Home health PT ?  ?  ?Assistance Recommended at Discharge Frequent or constant Supervision/Assistance  ?Patient can return home with the following A little help with walking and/or transfers;A little help with bathing/dressing/bathroom;Assist for transportation;Help with stairs or ramp for entrance;Assistance with cooking/housework ?  ?Equipment Recommendations ? Rolling [place] (2 wheels)  ?   ?Recommendations for Other Services   ? ? ?  ?Precautions / Restrictions Precautions ?Precautions: Sternal;Fall;Other (comment) ?Precaution Booklet Issued: Yes (comment) ?Restrictions ?Weight Bearing Restrictions: Yes ?Other Position/Activity Restrictions: sternal precautions  ?  ? ?Mobility ? Bed Mobility ?  ?  ?  ?  ?  ?  ?  ?General bed mobility comments: Pt up in recliner upon arrival. ?  ? ?Transfers ?Overall transfer level: Needs assistance ?Equipment used: None ?Transfers: Sit to/from Stand ?Sit to Stand: Min guard ?  ?  ?  ?  ?  ?General transfer comment: Pt with posterior sway and some difficulty powering up to stand, but able to recover and complete with only min guard for safety. ?  ? ?Ambulation/Gait ?Ambulation/Gait assistance: Min guard, Min assist ?Gait Distance (Feet): 420 Feet ?Assistive device: None ?Gait Pattern/deviations: Step-through pattern, Decreased stride length, Decreased dorsiflexion - right, Decreased dorsiflexion - left, Drifts right/left, Narrow base of support, Scissoring ?Gait velocity: reduced ?Gait velocity interpretation: 1.31 - 2.62 ft/sec, indicative of limited community ambulator ?  ?General Gait Details: Pt with slow, staggering gait with narrow BOS and intermittently scissorin, resulting in LOB bouts needing minA to recover intermittently. Pt's stance width and stability improved as distance and speed progressed, but still displayed moments of instability. cues provided to increase feet clearance ? ? ?Stairs ?Stairs: Yes ?Stairs assistance: Min guard ?Stair Management: One rail Right, One rail Left, Alternating pattern, Step to pattern, Forwards ?Number of Stairs: 2 ?General stair comments: Descends with L rail and step-to pattern, ascends with R rail and reciprocal pattern, no LOB but unsteadiness noted, min guard for safety ? ? ?Wheelchair Mobility ?  ? ?Modified Rankin (Stroke Patients Only) ?  ? ? ?  ?  Balance Overall balance assessment: Needs assistance ?Sitting-balance  support: Feet supported ?Sitting balance-Leahy Scale: Good ?  ?  ?Standing balance support: No upper extremity supported, During functional activity ?Standing balance-Leahy Scale: Fair ?Standing balance comment: Able to ambulate without UE support but displays instability, needing minA with LOB bouts intermittently. ?  ?  ?  ?  ?  ?  ?  ?  ?  ?  ?  ?  ? ?  ?Cognition Arousal/Alertness: Awake/alert ?Behavior During Therapy: [facility] for tasks assessed/performed ?Overall Cognitive Status: Impaired/Different from baseline ?Area of Impairment: Problem solving ?  ?  ?  ?  ?  ?  ?  ?  ?  ?  ?  ?  ?  ?  ?Problem Solving: Requires verbal cues, Slow processing ?General Comments: Pt with good recall and compliance with sternal precautions. Slow processing at times, but follows commands approrpiately and seems aware of her deficits. ?  ?  ? ?  ?Exercises   ? ?  ?General Comments General comments (skin integrity, edema, etc.): Educated pt and husband on need to use gait belt (provided) to hold onto pt for safety with mobility and to use RW for safer mobility at d/c. They verbalized understanding ?  ?  ? ?Pertinent Vitals/Pain Pain Assessment ?Pain Assessment: Faces ?Faces Pain Scale: Hurts a little bit ?Pain Location: sternum ?Pain Descriptors / Indicators: Discomfort, Operative site guarding ?Pain Intervention(s): Limited activity within patient's tolerance, Monitored during session, Repositioned  ? ? ?Home Living   ?  ?  ?  ?  ?  ?  ?  ?  ?  ?   ?  ?Prior Function    ?  ?  ?   ? ?PT Goals (current goals can now be found in the care plan section) Acute Rehab PT Goals ?Patient Stated Goal: to get better and go home if able ?PT Goal Formulation: With patient/family ?Time For Goal Achievement: 02/24/22 ?Potential to Achieve Goals: Good ?Progress towards PT goals: Progressing toward goals ? ?  ?Frequency ? ? ? Min 3X/week ? ? ? ?  ?PT Plan Discharge plan needs to be updated;Equipment recommendations need to be updated   ? ? ?Co-evaluation   ?  ?  ?  ?  ? ?  ?AM-PAC PT "6 Clicks" Mobility   ?Outcome Measure ? Help needed turning from your back to your side while in a flat bed without using bedrails?: A Little ?Help needed moving from lying on your back to sitting on the side of a flat bed without using bedrails?: A Little ?Help needed moving to and from a bed to a chair (including a wheelchair)?: A Little ?Help needed standing up from a chair using your arms (e.g., wheelchair or bedside chair)?: A Little ?Help needed to walk in hospital room?: A Little ?Help needed climbing 3-5 steps with a railing? : A Little ?6 Click Score: 18 ? ?  ?End of Session Equipment Utilized During Treatment: Gait belt ?Activity Tolerance: Patient tolerated treatment well ?Patient left: in chair;with call bell/phone within reach;with family/visitor present ?Nurse Communication: Mobility status ?PT Visit Diagnosis: Unsteadiness on feet (R26.81);Muscle weakness (generalized) (M62.81);Difficulty in walking, not elsewhere classified (R26.2);Other abnormalities of gait and mobility (R26.89) ?  ? ? ?Time: 1610-9604 ?PT Time Calculation (min) (ACUTE ONLY): 27 min ? ?Charges:  $Gait Training: 8-22 mins ?$Therapeutic Activity: 8-22 mins          ?          ? ?Moishe Spice, PT,  DPT ?Acute Rehabilitation Services  ?Pager: [phone] ?Office: [phone] ? ? ? ?Maretta Bees Pettis ?02/14/2022, 1:13 PM ? ?

## 2022-02-15 ENCOUNTER — Inpatient Hospital Stay (HOSPITAL_COMMUNITY): Payer: Medicare HMO

## 2022-02-15 LAB — BASIC METABOLIC PANEL
Anion gap: 10 (ref 5–15)
BUN: 21 mg/dL (ref 8–23)
CO2: 38 mmol/L — ABNORMAL HIGH (ref 22–32)
Calcium: 8.8 mg/dL — ABNORMAL LOW (ref 8.9–10.3)
Chloride: 86 mmol/L — ABNORMAL LOW (ref 98–111)
Creatinine, Ser: 1.09 mg/dL — ABNORMAL HIGH (ref 0.44–1.00)
GFR, Estimated: 53 mL/min — ABNORMAL LOW (ref >60)
Glucose, Bld: 117 mg/dL — ABNORMAL HIGH (ref 70–99)
Potassium: 3.5 mmol/L (ref 3.5–5.1)
Sodium: 134 mmol/L — ABNORMAL LOW (ref 135–145)

## 2022-02-15 LAB — LACTIC ACID, PLASMA: Lactic Acid, Venous: 1.2 mmol/L (ref 0.5–1.9)

## 2022-02-15 LAB — PROTIME-INR
INR: 1.4 — ABNORMAL HIGH (ref 0.8–1.2)
Prothrombin Time: 17.3 seconds — ABNORMAL HIGH (ref 11.4–15.2)

## 2022-02-15 LAB — COOXEMETRY PANEL
Carboxyhemoglobin: 1.8 % — ABNORMAL HIGH (ref 0.5–1.5)
Carboxyhemoglobin: 1.7 % — ABNORMAL HIGH (ref 0.5–1.5)
Methemoglobin: <0.7 % (ref 0.0–1.5)
Methemoglobin: <0.7 % (ref 0.0–1.5)
O2 Saturation: 44.9 %
O2 Saturation: 56.9 %
Total hemoglobin: 10.2 g/dL — ABNORMAL LOW (ref 12.0–16.0)
Total hemoglobin: 10 g/dL — ABNORMAL LOW (ref 12.0–16.0)

## 2022-02-15 LAB — GLUCOSE, CAPILLARY
Glucose-Capillary: 115 mg/dL — ABNORMAL HIGH (ref 70–99)
Glucose-Capillary: 146 mg/dL — ABNORMAL HIGH (ref 70–99)
Glucose-Capillary: 113 mg/dL — ABNORMAL HIGH (ref 70–99)
Glucose-Capillary: 141 mg/dL — ABNORMAL HIGH (ref 70–99)
Glucose-Capillary: 150 mg/dL — ABNORMAL HIGH (ref 70–99)
Glucose-Capillary: 118 mg/dL — ABNORMAL HIGH (ref 70–99)

## 2022-02-15 LAB — MAGNESIUM: Magnesium: 2.3 mg/dL (ref 1.7–2.4)

## 2022-02-15 LAB — CBC
HCT: 31 % — ABNORMAL LOW (ref 36.0–46.0)
Hemoglobin: 9.9 g/dL — ABNORMAL LOW (ref 12.0–15.0)
MCH: 28.7 pg (ref 26.0–34.0)
MCHC: 31.9 g/dL (ref 30.0–36.0)
MCV: 89.9 fL (ref 80.0–100.0)
Platelets: 936 K/uL (ref 150–400)
RBC: 3.45 MIL/uL — ABNORMAL LOW (ref 3.87–5.11)
RDW: 15 % (ref 11.5–15.5)
WBC: 27.2 K/uL — ABNORMAL HIGH (ref 4.0–10.5)
nRBC: 0 % (ref 0.0–0.2)

## 2022-02-15 LAB — PROCALCITONIN: Procalcitonin: <0.1 ng/mL

## 2022-02-15 LAB — MRSA NEXT GEN BY PCR, NASAL: MRSA by PCR Next Gen: NOT DETECTED

## 2022-02-15 MED ORDER — PIPERACILLIN-TAZOBACTAM 3.375 G IVPB
3.3750 g | Freq: Three times a day (TID) | INTRAVENOUS | Status: DC
  Administered 2022-02-15 – 2022-02-19 (×12): 3.375 g via INTRAVENOUS
  Filled 2022-02-15 (×12): qty 50

## 2022-02-15 MED ORDER — VANCOMYCIN HCL 1250 MG/250ML IV SOLN
1250.0000 mg | Freq: Once | INTRAVENOUS | Status: AC
  Administered 2022-02-15: 1250 mg via INTRAVENOUS
  Filled 2022-02-15: qty 250

## 2022-02-15 MED ORDER — POTASSIUM CHLORIDE CRYS ER 20 MEQ PO TBCR
20.0000 meq | EXTENDED_RELEASE_TABLET | ORAL | Status: AC
  Administered 2022-02-15 (×3): 20 meq via ORAL
  Filled 2022-02-15 (×3): qty 1

## 2022-02-15 MED ORDER — VANCOMYCIN HCL 750 MG/150ML IV SOLN
750.0000 mg | INTRAVENOUS | Status: DC
  Administered 2022-02-16: 750 mg via INTRAVENOUS
  Filled 2022-02-15 (×2): qty 150

## 2022-02-15 NOTE — Progress Notes (Addendum)
?  ? ? Advanced Heart Failure Rounding Note ? ?PCP-Cardiologist: None  ? ?Subjective:   ? ?76 y/o with severe CAD/NSTEMI and iCM EF 30-35%. Developed refractory angina and shock with respiratory failure. Intubated and IABP placed.  ?4/7 CABG + MVR  ?4/8 Extubated, IABP removed  ?04/11 AFL >>converted shortly after starting amio gtt ? ?Confused overnight. Denies CP or SOB. CVP 7. Co-ox 45->57% WBC 17 > 27K ? ? ?Limited echo 04/12: ?EF 35-40%, RMA, RV okay, mean gradient 7 across mitral valve prosthesis, large density in LA cavity that likely represents artifact  ? ? ?Objective:   ?Weight Range: ?57.1 kg ?Body mass index is 21.61 kg/m?.  ? ?Vital Signs:   ?Temp:  [95.5 ?F (35.3 ?C)-99.1 ?F (37.3 ?C)] 98.7 ?F (37.1 ?C) (04/16 0735) ?Pulse Rate:  [95-104] 98 (04/16 1100) ?Resp:  [14-27] 14 (04/16 1100) ?BP: (75-120)/(46-72) 116/61 (04/16 1100) ?SpO2:  [88 %-97 %] 93 % (04/16 1100) ?Weight:  [57.1 kg] 57.1 kg (04/16 0600) ?Last BM Date : 02/13/22 ? ?Weight change: ?Filed Weights  ? 02/13/22 0500 02/14/22 0500 02/15/22 0600  ?Weight: 59.7 kg 58.4 kg 57.1 kg  ? ? ?Intake/Output:  ? ?Intake/Output Summary (Last 24 hours) at 02/15/2022 1117 ?Last data filed at 02/15/2022 0715 ?Gross per 24 hour  ?Intake 150 ml  ?Output 1500 ml  ?Net -1350 ml  ? ?  ? ? ?Physical Exam  ? ?General:  Sitting in chair. Pale. Mildly confused ?HEENT: normal ?Neck: supple. no JVD. Carotids 2+ bilat; no bruits. No lymphadenopathy or thryomegaly appreciated. ?Cor: Sternal wound ok Regular rate & rhythm. No rubs, gallops or murmurs. ?Lungs: decreased at bases ?Abdomen: soft, nontender, nondistended. No hepatosplenomegaly. No bruits or masses. Good bowel sounds. ?Extremities: no cyanosis, clubbing, rash, edema ?Neuro: alert & orientedx3, cranial nerves grossly intact. moves all 4 extremities w/o difficulty. Affect pleasant but mildly confused ? ?Telemetry  ? ?SR [id number] Personally reviewed ? ?Labs  ?  ?CBC ?Recent Labs  ?  02/14/22 ?0355 02/15/22 ?0400   ?WBC 17.9* 27.2*  ?HGB 9.0* 9.9*  ?HCT 27.6* 31.0*  ?MCV 87.3 89.9  ?PLT 730* 936*  ? ? ?Basic Metabolic Panel ?Recent Labs  ?  02/14/22 ?0355 02/15/22 ?0400  ?NA 136 134*  ?K 4.0 3.5  ?CL 91* 86*  ?CO2 35* 38*  ?GLUCOSE 109* 117*  ?BUN 22 21  ?CREATININE 1.05* 1.09*  ?CALCIUM 8.7* 8.8*  ?MG 2.3 2.3  ? ? ?Liver Function Tests ?No results for input(s): AST, ALT, ALKPHOS, BILITOT, PROT, ALBUMIN in the last 72 hours. ? ?No results for input(s): LIPASE, AMYLASE in the last 72 hours. ?Cardiac Enzymes ?No results for input(s): CKTOTAL, CKMB, CKMBINDEX, TROPONINI in the last 72 hours. ? ?BNP: ?BNP (last 3 results) ?Recent Labs  ?  02/02/22 ?2321  ?BNP 366.4*  ? ? ? ?ProBNP (last 3 results) ?No results for input(s): PROBNP in the last 8760 hours. ? ? ?D-Dimer ?No results for input(s): DDIMER in the last 72 hours. ?Hemoglobin A1C ?No results for input(s): HGBA1C in the last 72 hours. ? ?Fasting Lipid Panel ?No results for input(s): CHOL, HDL, LDLCALC, TRIG, CHOLHDL, LDLDIRECT in the last 72 hours. ? ?Thyroid Function Tests ?No results for input(s): TSH, T4TOTAL, T3FREE, THYROIDAB in the last 72 hours. ? ?Invalid input(s): FREET3 ? ? ?Other results: ? ? ?Imaging  ? ? ?DG Chest Port 1 View ? ?Result Date: 02/15/2022 ?CLINICAL DATA:  Postop from mitral valve replacement and CABG. Follow-up atelectasis. EXAM: PORTABLE CHEST 1 VIEW  COMPARISON:  02/13/2022 FINDINGS: Stable mild cardiomegaly. Left arm PICC line remains in appropriate position. Small bilateral pleural effusions are unchanged. Mild bibasilar atelectasis or infiltrates appears stable. No new or worsening areas of pulmonary opacity are identified IMPRESSION: Stable small bilateral pleural effusions and bibasilar atelectasis versus infiltrates. Electronically Signed   By: Marlaine Hind M.D.   On: 02/15/2022 08:22   ? ? ?Medications:   ? ? ?Scheduled Medications: ? amiodarone  200 mg Oral BID  ? aspirin EC  81 mg Oral Daily  ? bisacodyl  10 mg Oral Daily  ? Or  ?  bisacodyl  10 mg Rectal Daily  ? Chlorhexidine Gluconate Cloth  6 each Topical Daily  ?  Cardiac Surgery, Patient & Family Education   Does not apply Once  ? docusate sodium  200 mg Oral Daily  ? enoxaparin (LOVENOX) injection  40 mg Subcutaneous QHS  ? feeding supplement  237 mL Oral BID BM  ? feeding supplement (GLUCERNA SHAKE)  237 mL Oral BID BM  ? fluticasone  1 spray Each Nare Daily  ? insulin aspart  0-15 Units Subcutaneous TID WC  ? insulin detemir  10 Units Subcutaneous Daily  ? levothyroxine  25 mcg Oral Q0600  ? mouth rinse  15 mL Mouth Rinse BID  ? melatonin  3 mg Oral QHS  ? midodrine  2.5 mg Oral TID WC  ? multivitamin with minerals  1 tablet Oral Daily  ? pantoprazole  40 mg Oral Daily  ? potassium chloride  20 mEq Oral Q4H  ? Ensure Max Protein  11 oz Oral QHS  ? rosuvastatin  5 mg Oral Daily  ? scopolamine  1 patch Transdermal Q72H  ? sodium chloride flush  10-40 mL Intracatheter Q12H  ? sodium chloride flush  3 mL Intravenous Q12H  ? spironolactone  12.5 mg Oral Daily  ? torsemide  40 mg Oral Daily  ? warfarin  5 mg Oral q1600  ? Warfarin - Physician Dosing Inpatient   Does not apply q1600  ? ? ?Infusions: ? sodium chloride    ? ? ?PRN Medications: ?sodium chloride, bismuth subsalicylate, Gerhardt's butt cream, oxyCODONE, prochlorperazine, promethazine, simethicone, sodium chloride flush, sodium chloride flush, traMADol ? ? ? ?Patient Profile  ? ?76 y/o with severe CAD/NSTEMI and iCM EF 30-35%. Developed refractory angina and shock yesterday with respiratory failure. Intubated and IABP placed. Taken to OR 4/7 for CABG + MVR  ? ?Assessment/Plan  ? ?1. NSTEMI---> Cardiogenic Shock ?- HS Trop 301>6010> 6513 ?- Cath with 3V disease, elevated wedge, and reduced cardiac output. IABP placed in lab with Norepi/Milrinone.  ?- 4/7 s/p CABG + MVR ?- On ASA 325. Would typically give plavix post NSTEMI, but also on warfarin with recent MVR. ?- No s/s angina. Continue statin ?- Cardiac rehab  ?  ?2.  Acute Hypoxemic Respiratory Failure ?- Extubated 4/8. ?- stable ? ?3. Acute HFrEF, ICM  -> cardiogenic shock ?- Echo EF 30-35% RV ok. Severe MR. EF improved on post CABG TEE. ?- Limited echo 004/12: EF 35-40%, RV okay, large density in LA cavity (? Thrombus). Will review with Dr. Haroldine Laws to decide on additional imaging. ?- Now s/p CABG ?- Off Milrinone, NE weaned off 04/13. IABP out ?- Co-ox has been labile  Early am 44% -> 57% ?- On midodrine 2.5 mg TID for BP support.  ?- CVP 7 Hold torsemide today with possible early sepsis  ?- Continue spiro 12.5 ?- Supp K ?- Titrate GDMT  as tolerated ? ?4. Severe MR  ?- S/p MV replacement with 25 mm Mitris Resilia pericardial valve ?- Warfarin started per TCTS (will be on for 3 months) ?- INR 1.4 ?  ?5. Hypothyroidism  ?-On levothyroxine.  ?  ?6. AKI due to ATN/shock ?- resolved ?- monitor w/ diuresis  ? ?7. Acute blood loss anemia ?- transfuse as needed to keep hgb >= 8.0 ?- Hgb 9.9 ? ?8. Confusion/leukocytosis ?- WBC 17-> 27k ?- UA ok ?- Will check PCT, lactic acid. Pull PICC ?-> lactic acid ok. D/w Dr. Carlis Abbott in [place] -> concern over RLL infiltrate. Start vanc/zosyn. Will get chest CT ? ?9. B/l Pleural Effusions ?- Improved with diuresis ?- lung u/s with IR 4/14 no significant effusion to tap ? ?10. Atrial Flutter ?- Currently SR 90s with 1st degree AVB  ?- Continue 200 daily ?- on warfarin, INR 1.4 ? ?Keep in ICU today ? ? ?Length of Stay: 12 ? ?Glori Bickers, MD  ?02/15/2022, 11:17 AM ? ?Advanced Heart Failure Team ?Pager [phone] (M-F; 7a - 5p)  ?Please contact [facility] for night-coverage after hours (5p -7a ) and weekends on [email or web address] ? ?11:17 AM ? ? ?

## 2022-02-15 NOTE — Progress Notes (Signed)
Pt refusing BIPAP 

## 2022-02-15 NOTE — Progress Notes (Signed)
9 Days Post-Op Procedure(s) (LRB): ?CORONARY ARTERY BYPASS GRAFTING X4, Using Left and Right Greater Saphenous Vein Harvested Endoscopically (N/A) ?MITRAL VALVE (MV) REPLACEMENT Using 25MM Mitris Valve (N/A) ?TRANSESOPHAGEAL ECHOCARDIOGRAM (TEE) (N/A) ?Subjective: ?No complaints this Am ? ?Objective: ?Vital signs in last 24 hours: ?Temp:  [95.5 ?F (35.3 ?C)-99.1 ?F (37.3 ?C)] 98.7 ?F (37.1 ?C) (04/16 0735) ?Pulse Rate:  [95-104] 97 (04/16 0800) ?Cardiac Rhythm: Normal sinus rhythm (04/16 0745) ?Resp:  [14-27] 15 (04/16 0800) ?BP: (75-120)/(46-72) 98/52 (04/16 0800) ?SpO2:  [88 %-97 %] 97 % (04/16 0800) ?Weight:  [57.1 kg] 57.1 kg (04/16 0600) ? ?Hemodynamic parameters for last 24 hours: ?CVP:  [4 mmHg-17 mmHg] 4 mmHg ? ?Intake/Output from previous day: ?04/15 0701 - 04/16 0700 ?In: 150 [P.O.:150] ?Out: 1600 [Urine:1600] ?Intake/Output this shift: ?Total I/O ?In: -  ?Out: 100 [Urine:100] ? ?General appearance: alert, cooperative, and no distress ?Neurologic: intact ?Heart: regular rate and rhythm ?Lungs: diminished breath sounds bibasilar ?Abdomen: normal findings: soft, non-tender ?Extremities: trace edema ?Wound: clean and dry ? ?Lab Results: ?Recent Labs  ?  02/13/22 ?0310 02/14/22 ?0355  ?WBC 15.0* 17.9*  ?HGB 8.0* 9.0*  ?HCT 25.1* 27.6*  ?PLT 562* 730*  ? ?BMET:  ?Recent Labs  ?  02/14/22 ?0355 02/15/22 ?0400  ?NA 136 134*  ?K 4.0 3.5  ?CL 91* 86*  ?CO2 35* 38*  ?GLUCOSE 109* 117*  ?BUN 22 21  ?CREATININE 1.05* 1.09*  ?CALCIUM 8.7* 8.8*  ?  ?PT/INR:  ?Recent Labs  ?  02/15/22 ?0400  ?LABPROT 17.3*  ?INR 1.4*  ? ?ABG ?   ?Component Value Date/Time  ? PHART 7.557 (H) 02/10/2022 2248  ? HCO3 38.0 (H) 02/10/2022 2500  ? TCO2 39 (H) 02/10/2022 3704  ? ACIDBASEDEF 3.0 (H) 02/07/2022 1010  ? O2SAT 44.9 02/15/2022 0400  ? ?CBG (last 3)  ?Recent Labs  ?  02/14/22 ?2337 02/15/22 ?0404 02/15/22 ?0734  ?GLUCAP 125* 115* 146*  ? ? ?Assessment/Plan: ?S/P Procedure(s) (LRB): ?CORONARY ARTERY BYPASS GRAFTING X4, Using Left and  Right Greater Saphenous Vein Harvested Endoscopically (N/A) ?MITRAL VALVE (MV) REPLACEMENT Using 25MM Mitris Valve (N/A) ?TRANSESOPHAGEAL ECHOCARDIOGRAM (TEE) (N/A) ?NEURO- mild confusion last night, appropriate this AM ?CV- in Sr, co-ox low at 45, CVP 3-5 ? BP remains relatively low on midodrine ?RESP- Continue pulmonary hygiene ? CXR stable ?RENAL- creatinine stable ? Hypokalemia- supplement K ?ENDO- CBG well controlled ?GI- tolerating POs ?Deconditioning- stable ?Leukocytosis- Afebrile, U/A unremarkable, recheck WBC tomorrow ?Transfer to [facility] when bed available ? LOS: 12 days  ? ? ?Melrose Nakayama ?02/15/2022 ? ? ?

## 2022-02-15 NOTE — Progress Notes (Signed)
Pharmacy Antibiotic Note ? ?Tonya Hamilton is a 76 y.o. female admitted on 02/02/2022 with aspiration pneumonia.  Pharmacy has been consulted for vancomycin/zosyn dosing. ? ?WBC increased dramatically the last couple of days from 15>17>27. Pt is afebrile. Patient has multiple antibiotics earlier on during admission (see below).  ? ?Plan: ?Zosyn EI 3.375 q8h ?Vancomycin 1250 mg x1 then 750 mg q24h (expected AUC 508, Scr 1.09) ?F/u MRSA PCR, cultures, clinical status, LOT and ability to de-escalate ? ?Height: '5\' 4"'$  (162.6 cm) ?Weight: 57.1 kg (125 lb 14.1 oz) ?IBW/kg (Calculated) : 54.7 ? ?Temp (24hrs), Avg:98.4 ?F (36.9 ?C), Min:97.9 ?F (36.6 ?C), Max:99.1 ?F (37.3 ?C) ? ?Recent Labs  ?Lab 02/11/22 ?0304 02/11/22 ?0786 02/12/22 ?7544 02/12/22 ?1547 02/13/22 ?0310 02/13/22 ?1512 02/14/22 ?0355 02/15/22 ?0400 02/15/22 ?1127  ?WBC 22.5*  --  18.1*  --  15.0*  --  17.9* 27.2*  --   ?CREATININE 0.98   < > 0.92 0.95 0.92 1.10* 1.05* 1.09*  --   ?LATICACIDVEN  --   --   --   --   --   --   --   --  1.2  ? < > = values in this interval not displayed.  ?  ?Estimated Creatinine Clearance: 37.9 mL/min (A) (by C-G formula based on SCr of 1.09 mg/dL (H)).   ? ?Allergies  ?Allergen Reactions  ? Botox [Onabotulinumtoxina]   ?  Was given too much botox and at night, felt like her throat was closing up.   ? Diclofenac Other (See Comments)  ?  Unknown reaction per pt and SO  ? Lisinopril Swelling  ? Simvastatin Other (See Comments)  ?  myalgias  ? Statins Other (See Comments)  ?  myalgias  ? Sulfa Antibiotics Itching  ? ? ?Antimicrobials this admission: ?4/16 vancomycin >>  ?4/16 zosyn >>  ?4/7 cefazolin >> 4/9 ?4/4 ceftriaxone ?4/4 azithromycin ?4/4 cefdnir >> 4/6 ?4/4 trimethoprim >> 4/7 ? ?Dose adjustments this admission: ? ? ?Microbiology results: ?4/16 Rcx- sent ?4/16 Bcx- sent ?4/16 MRSA PCR sent ? ?Thank you for allowing pharmacy to be a part of this patient?s care. ? ?Tonya Hamilton ?02/15/2022 1:52 PM ? ?

## 2022-02-15 NOTE — Progress Notes (Signed)
? ?   ?  [address] ?      [place] [address] ?            [phone]   ? ?  ?Resting comfortably ? ?BP (!) 111/59   Pulse 98   Temp 98.3 ?F (36.8 ?C) (Oral)   Resp 15   Ht '5\' 4"'$  (1.626 m)   Wt 57.1 kg   SpO2 94%   BMI 21.61 kg/m?  ? ?Intake/Output Summary (Last 24 hours) at 02/15/2022 1918 ?Last data filed at 02/15/2022 1800 ?Gross per 24 hour  ?Intake 173.59 ml  ?Output 1500 ml  ?Net -1326.41 ml  ? ?WBC elevated to 27K ?CT chest shows possible right lower lobe pneumonia ?Started on Vanco and Zosyn empirically ?PCT and lactic acid normal ? ?Revonda Standard Roxan Hockey, MD ?[facility] ?[phone] ? ?

## 2022-02-16 ENCOUNTER — Encounter (HOSPITAL_COMMUNITY): Payer: Self-pay | Admitting: Surgery

## 2022-02-16 DIAGNOSIS — J9601 Acute respiratory failure with hypoxia: Secondary | ICD-10-CM | POA: Diagnosis not present

## 2022-02-16 DIAGNOSIS — I4892 Unspecified atrial flutter: Secondary | ICD-10-CM | POA: Diagnosis not present

## 2022-02-16 DIAGNOSIS — D72829 Elevated white blood cell count, unspecified: Secondary | ICD-10-CM

## 2022-02-16 LAB — BASIC METABOLIC PANEL
Anion gap: 8 (ref 5–15)
BUN: 33 mg/dL — ABNORMAL HIGH (ref 8–23)
CO2: 34 mmol/L — ABNORMAL HIGH (ref 22–32)
Calcium: 8.7 mg/dL — ABNORMAL LOW (ref 8.9–10.3)
Chloride: 93 mmol/L — ABNORMAL LOW (ref 98–111)
Creatinine, Ser: 1.11 mg/dL — ABNORMAL HIGH (ref 0.44–1.00)
GFR, Estimated: 52 mL/min — ABNORMAL LOW (ref >60)
Glucose, Bld: 123 mg/dL — ABNORMAL HIGH (ref 70–99)
Potassium: 4.2 mmol/L (ref 3.5–5.1)
Sodium: 135 mmol/L (ref 135–145)

## 2022-02-16 LAB — CBC
HCT: 28.5 % — ABNORMAL LOW (ref 36.0–46.0)
Hemoglobin: 9.1 g/dL — ABNORMAL LOW (ref 12.0–15.0)
MCH: 28.2 pg (ref 26.0–34.0)
MCHC: 31.9 g/dL (ref 30.0–36.0)
MCV: 88.2 fL (ref 80.0–100.0)
Platelets: 813 10*3/uL — ABNORMAL HIGH (ref 150–400)
RBC: 3.23 MIL/uL — ABNORMAL LOW (ref 3.87–5.11)
RDW: 14.9 % (ref 11.5–15.5)
WBC: 29.1 10*3/uL — ABNORMAL HIGH (ref 4.0–10.5)
nRBC: 0 % (ref 0.0–0.2)

## 2022-02-16 LAB — GLUCOSE, CAPILLARY
Glucose-Capillary: 123 mg/dL — ABNORMAL HIGH (ref 70–99)
Glucose-Capillary: 147 mg/dL — ABNORMAL HIGH (ref 70–99)
Glucose-Capillary: 123 mg/dL — ABNORMAL HIGH (ref 70–99)
Glucose-Capillary: 151 mg/dL — ABNORMAL HIGH (ref 70–99)

## 2022-02-16 LAB — SEDIMENTATION RATE: Sed Rate: 32 mm/hr — ABNORMAL HIGH (ref 0–22)

## 2022-02-16 LAB — PROTIME-INR
INR: 1.9 — ABNORMAL HIGH (ref 0.8–1.2)
Prothrombin Time: 21.2 seconds — ABNORMAL HIGH (ref 11.4–15.2)

## 2022-02-16 LAB — MAGNESIUM: Magnesium: 2.3 mg/dL (ref 1.7–2.4)

## 2022-02-16 LAB — C-REACTIVE PROTEIN: CRP: 5.8 mg/dL — ABNORMAL HIGH (ref <1.0)

## 2022-02-16 LAB — SAVE SMEAR(SSMR), FOR PROVIDER SLIDE REVIEW

## 2022-02-16 LAB — PROCALCITONIN: Procalcitonin: <0.1 ng/mL

## 2022-02-16 MED ORDER — ENSURE ENLIVE PO LIQD
237.0000 mL | Freq: Two times a day (BID) | ORAL | Status: DC
  Administered 2022-02-16 – 2022-02-17 (×2): 237 mL via ORAL

## 2022-02-16 MED ORDER — COLCHICINE 0.6 MG PO TABS
0.6000 mg | ORAL_TABLET | Freq: Two times a day (BID) | ORAL | Status: DC
  Administered 2022-02-16 – 2022-02-18 (×6): 0.6 mg via ORAL
  Filled 2022-02-16 (×6): qty 1

## 2022-02-16 NOTE — TOC CM/SW Note (Signed)
HF TOC CM spoke to pt and husband at bedside. Pt states she is agreeable to Cardiac rehab outpatient. TOC CM ordered pt a rolling [place] and husband agreeable to pay out or pocket to save her insurance benefit. Pt reports she will need a 3n1 bedside commode. [place] for DME for home. Jonnie Finner RN3 CCM, Heart Failure TOC CM [phone]  ?

## 2022-02-16 NOTE — Progress Notes (Signed)
Physical Therapy Treatment ?Patient Details ?Name: Tonya Hamilton ?MRN: [id number] ?DOB: 05/25/1946 ?Today's Date: 02/16/2022 ? ? ?History of Present Illness 76 y.o. F who presents 4/3 with dyspnea. Echo with reduced EF and regional wall motion abnormalities with severe MR, cardiology consulted and taken to cath lab which revealed severe LM and 3V disease.  IABP placed and intubated, in cardiogenic shock. S/p CABG and MVR 4/7. Significant PMH: diverticulitis, OA, HTN, HLD, osteopenia, anxiety. ? ?  ?PT Comments  ? ? Pt has made excellent progress towards her physical therapy goals, exhibiting improved activity tolerance and ambulation distance. Pt ambulating initial 370 ft with a RW and then an additional 50 ft with no assistive device. Practiced sit to stands with maintaining sternal precautions. Updated d/c plan in light of pt progress. ?   ?Recommendations for follow up therapy are one component of a multi-disciplinary discharge planning process, led by the attending physician.  Recommendations may be updated based on patient status, additional functional criteria and insurance authorization. ? ?Follow Up Recommendations ? Other (comment) (OP Cardiac rehab) ?  ?  ?Assistance Recommended at Discharge Frequent or constant Supervision/Assistance  ?Patient can return home with the following A little help with walking and/or transfers;A little help with bathing/dressing/bathroom;Assist for transportation;Help with stairs or ramp for entrance;Assistance with cooking/housework ?  ?Equipment Recommendations ? Rolling Tatlock (2 wheels)  ?  ?Recommendations for Other Services   ? ? ?  ?Precautions / Restrictions Precautions ?Precautions: Sternal;Fall;Other (comment) ?Precaution Booklet Issued: Yes (comment) ?Restrictions ?Weight Bearing Restrictions: Yes ?Other Position/Activity Restrictions: sternal precautions  ?  ? ?Mobility ? Bed Mobility ?Overal bed mobility: Modified Independent ?  ?  ?  ?  ?  ?  ?General bed mobility  comments: No physical assist required, HOB elevated ?  ? ?Transfers ?Overall transfer level: Needs assistance ?Equipment used: None ?Transfers: Sit to/from Stand ?Sit to Stand: Min guard ?  ?  ?  ?  ?  ?General transfer comment: cues for hand placement, does not need to rock forward any longer and can power up on first bout, initial min guard to steady ?  ? ?Ambulation/Gait ?Ambulation/Gait assistance: Min guard, Supervision ?Gait Distance (Feet): 420 Feet ?Assistive device: None ?Gait Pattern/deviations: Step-through pattern, Decreased stride length, Drifts right/left, Narrow base of support ?Gait velocity: reduced ?  ?  ?General Gait Details: Pt with steady pace, ambulating initial 370 ft with RW and additional 50 ft with no assistive device at a supervision-min guard assist level ? ? ?Stairs ?  ?  ?  ?  ?  ? ? ?Wheelchair Mobility ?  ? ?Modified Rankin (Stroke Patients Only) ?  ? ? ?  ?Balance Overall balance assessment: Needs assistance ?Sitting-balance support: Feet supported ?Sitting balance-Leahy Scale: Good ?  ?  ?Standing balance support: No upper extremity supported, During functional activity ?Standing balance-Leahy Scale: Good ?Standing balance comment: Able to ambulate without UE support ?  ?  ?  ?  ?  ?  ?  ?  ?  ?  ?  ?  ? ?  ?Cognition Arousal/Alertness: Awake/alert ?Behavior During Therapy: [facility] for tasks assessed/performed ?Overall Cognitive Status: Impaired/Different from baseline ?Area of Impairment: Problem solving ?  ?  ?  ?  ?  ?  ?  ?  ?  ?  ?  ?  ?  ?  ?Problem Solving: Requires verbal cues, Slow processing ?General Comments: Min cues for sternal precautions ?  ?  ? ?  ?Exercises General Exercises - Lower  Extremity ?Long Arc Quad: Both, 10 reps, Seated ?Hip Flexion/Marching: Both, 10 reps ?Other Exercises ?Other Exercises: Serial sit to stands x 3 ? ?  ?General Comments   ?  ?  ? ?Pertinent Vitals/Pain Pain Assessment ?Pain Assessment: Faces ?Faces Pain Scale: Hurts a little bit ?Pain  Location: sternum ?Pain Descriptors / Indicators: Discomfort, Operative site guarding ?Pain Intervention(s): Monitored during session  ? ? ?Home Living   ?  ?  ?  ?  ?  ?  ?  ?  ?  ?   ?  ?Prior Function    ?  ?  ?   ? ?PT Goals (current goals can now be found in the care plan section) Acute Rehab PT Goals ?Patient Stated Goal: to get better and go home if able ?PT Goal Formulation: With patient/family ?Time For Goal Achievement: 02/24/22 ?Potential to Achieve Goals: Good ?Progress towards PT goals: Progressing toward goals ? ?  ?Frequency ? ? ? Min 3X/week ? ? ? ?  ?PT Plan Discharge plan needs to be updated;Equipment recommendations need to be updated  ? ? ?Co-evaluation   ?  ?  ?  ?  ? ?  ?AM-PAC PT "6 Clicks" Mobility   ?Outcome Measure ? Help needed turning from your back to your side while in a flat bed without using bedrails?: None ?Help needed moving from lying on your back to sitting on the side of a flat bed without using bedrails?: None ?Help needed moving to and from a bed to a chair (including a wheelchair)?: A Little ?Help needed standing up from a chair using your arms (e.g., wheelchair or bedside chair)?: A Little ?Help needed to walk in hospital room?: A Little ?Help needed climbing 3-5 steps with a railing? : A Little ?6 Click Score: 20 ? ?  ?End of Session   ?Activity Tolerance: Patient tolerated treatment well ?Patient left: with call bell/phone within reach;with family/visitor present;in bed ?Nurse Communication: Mobility status ?PT Visit Diagnosis: Unsteadiness on feet (R26.81);Muscle weakness (generalized) (M62.81);Difficulty in walking, not elsewhere classified (R26.2);Other abnormalities of gait and mobility (R26.89) ?  ? ? ?Time: 8563-1497 ?PT Time Calculation (min) (ACUTE ONLY): 23 min ? ?Charges:  $Therapeutic Activity: 23-37 mins          ?          ? ?Wyona Almas, PT, DPT ?Acute Rehabilitation Services ?Pager [phone] ?Office [phone] ? ? ? ?Carloine Margo Aye ?02/16/2022, 11:27  AM ? ?

## 2022-02-16 NOTE — Assessment & Plan Note (Signed)
Tolerating low-dose Crestor ?

## 2022-02-16 NOTE — Progress Notes (Addendum)
? ?NAME:  SAUSHA RAYMOND, MRN:  [id number], DOB:  11-26-1945, LOS: 18 ?ADMISSION DATE:  02/02/2022, CONSULTATION DATE:  02/03/22 ?REFERRING MD:  Christy Gentles CHIEF COMPLAINT:  Dyspnea  ? ?History of Present Illness:  ?Tonya Hamilton is a 76 y.o. female who has a PMH as outlined below.  She presented to [facility] ED 4/3 with dyspnea.  She states that symptoms began suddenly and progressed.  She also endorsed cough intimately productive of sputum.  No fevers/chills/sweats, chest pain, N/V/D, abdominal pain, myalgias, exposures known sick contacts, recent travel. ? ?In ED, she was found to be hypoxic.  CXR showed bilateral opacities and bilateral mild edema.  Troponin was elevated at 708 with repeat up to 1454.  BNP mildly elevated at 366.  EKG with ST depressions and global ischemia.  Leukocytosis to 23.6.  She was initially placed on Ventimask but was later upgraded to BiPAP.  She continued to require BiPAP which she did feel subjective improvement in symptoms.  Patient stable subsequently called for transfer to ICU at [facility] for further evaluation management. ? ?Pertinent  Medical History:  ?has History of colonic diverticulitis; Osteoarthritis; Hypertension; Hyperlipidemia; Hypothyroid; GERD (gastroesophageal reflux disease); Osteopenia; Anxiety; Osteoarthritis of right hip; Angioedema; Chronic intractable headache; Occipital neuralgia of left side; Neck pain; Hyperreflexia; Arthritis, multiple joint involvement; Spasmodic dysphonia; Acute respiratory failure ([place]); Respiratory failure ([place]); Community acquired pneumonia; Non-STEMI (non-ST elevated myocardial infarction) ([place]); Cardiogenic shock ([place]); Severe mitral regurgitation; Ischemic cardiomyopathy; Multiple vessel coronary artery disease; and S/P CABG x 4 on their problem list. ? ?Significant Hospital Events: ?Including procedures, antibiotic start and stop dates in addition to other pertinent events   ?4/4 admit, started on Cefepime and Azith and Bipap.   Trop rising, on heparin gtt,  Echo with reduced EF and regional wall motion abnormalities with severe MR, cardiology consulted and taken to cath lab which revealed severe LM and 3V disease.  IABP placed and intubated, in cardiogenic shock ?4/6 Lying in bed on IABP with progressive stability seen, plan for CABG with MVR tomorrow  ?4/7 OR to CABG and MVR, no issues overnight  ?4/8 Extubated and IABP removed.  Chest tubes removed ?4/9 up to chair. ?4/10 improved UOP with metolazone and increased lasix ?4/13 PICC placed ?4/17 worsening leukocytosis. ? ?Interim History / Subjective:  ?Patient is awake and in very good spirits. Has not vomited since taking oral potassium. She denies dyspnea or chest pain.  Has been up and ambulating.  ? ?Objective:  ?Blood pressure (!) 104/59, pulse 97, temperature 98 ?F (36.7 ?C), temperature source Oral, resp. rate 14, height '5\' 4"'$  (1.626 m), weight 57.8 kg, SpO2 100 %. ?CVP:  [2 mmHg-10 mmHg] 10 mmHg  ?   ? ?Intake/Output Summary (Last 24 hours) at 02/16/2022 0904 ?Last data filed at 02/16/2022 0700 ?Gross per 24 hour  ?Intake 776.04 ml  ?Output 1180 ml  ?Net -403.96 ml  ? ? ?Filed Weights  ? 02/14/22 0500 02/15/22 0600 02/16/22 0500  ?Weight: 58.4 kg 57.1 kg 57.8 kg  ? ?Physical Exam  ?General:  frail-appearing woman sitting in bed.  ?HEENT: [place]/AT, eyes anicteric ?Neuro: Awake and alert, moving all extremities 4+/5 strength throughout.  ?CV: S1-S2, regular rate and rhythm. No rub.  ?PULM: no distress on 2lpm with no crackles or bronchial breath sounds  ?GI: Soft, nontender, nondistended ?Extremities: No cyanosis, no significant edema ?Skin: No diffuse rashes, warm and dry.  Sternal incision healing well.  No erythema around left upper extremity PICC. ? ? ?Assessment &  Plan:  ? ?Leukocytosis ?Persistent leukocytosis since admission with nadir of 15.0 ?Now 29.1. Was normal as of 9/16/ 2021 ?Persistent thrombocytosis as well.  ?No clear source of infection, negative procalcitonin today.   ? ?- continue antibiotics ?- blood film review. Inflammatory response to MI/surgery.  ? ?Community acquired pneumonia ?Suspected initially on admission, but likely simply had HF from MI ? ?Severe mitral regurgitation ?INR: 1.9  ? ?-Coumadin for 3 months, ASA only therafter ?-Can stop Lovenox once INR > 2.0 ? ?Atrial flutter ([place]) ?In sinus rhythm now.  ? ?-Continue on oral amiodarone.  ? ?Hyperlipidemia ?Tolerating low-dose Crestor ? ?Multiple vessel coronary artery disease ?CORONARY ARTERY BYPASS GRAFTING X4, Using Left and Right Greater Saphenous Vein Harvested Endoscopically (N/A) . LIMA to LAD, SVG to D1, OM1, RCA ? ? ? ?Ischemic cardiomyopathy ?Repeat echo post operatively shows EF 35-40%  ?Off inotropic support, ScvO2 remain marginal 56.9 ? ?- continue midodrine and spironolactone. ?- blood pressure remains too marginal for betablocker or Entresto.  ?- consider holding diuretic due to contraction alkalosis.  ? ?S/P CABG x 4 ?Persistent leukocytosis with small pleural and pericardial effusions on CT ?PCT negative  ? ?- Suspect pericardial injury syndrome from MI ([place]) and recent pericardiotomy ?- Trial of colchicine ?- Low threshold to stop antibiotics.   ? ?Best practice (evaluated daily):  ?Diet/type: Regular consistency (see orders) ?DVT prophylaxis: Prophylactic Lovenox until warfarin therapeutic.  ?GI prophylaxis: H2B and PPI ?Lines: Left PICC ?Foley:  N/A ?Code Status:  full code ?Last date of multidisciplinary goals of care discussion: Family updated at bedside 4/10 ? ?Kipp Brood, MD FRCPC ?ICU Physician ?[place]  ?Pager: [phone] ?Mobile: [phone] ?After hours: [phone]. ? ? ?

## 2022-02-16 NOTE — Assessment & Plan Note (Signed)
Suspected initially on admission, but likely simply had HF from MI ?

## 2022-02-16 NOTE — Assessment & Plan Note (Addendum)
INR: 2.1 ? ?-Coumadin for 3 months, ASA only therafter ?-Stop Lovenox today ?

## 2022-02-16 NOTE — Assessment & Plan Note (Addendum)
Repeat echo post operatively shows EF 35-40%  ?Off inotropic support, ScvO2 remain marginal 56.9 ?Creatinine has improved.  ? ?- continue midodrine and spironolactone. ?- blood pressure remains too marginal for betablocker or Entresto. Anticipate weaning off midodrine eventually (prior to discharge or as outpatient) ?  ?

## 2022-02-16 NOTE — Assessment & Plan Note (Signed)
CORONARY ARTERY BYPASS GRAFTING X4, Using Left and Right Greater Saphenous Vein Harvested Endoscopically (N/A) . LIMA to LAD, SVG to D1, OM1, RCA ? ? ?

## 2022-02-16 NOTE — Progress Notes (Signed)
Occupational Therapy Treatment ?Patient Details ?Name: Tonya Hamilton ?MRN: [id number] ?DOB: 1946/06/21 ?Today's Date: 02/16/2022 ? ? ?History of present illness 76 y.o. F who presents 4/3 with dyspnea. Echo with reduced EF and regional wall motion abnormalities with severe MR, cardiology consulted and taken to cath lab which revealed severe LM and 3V disease.  IABP placed and intubated, in cardiogenic shock. S/p CABG and MVR 4/7. Significant PMH: diverticulitis, OA, HTN, HLD, osteopenia, anxiety. ?  ?OT comments ? Pt progressing towards established OT goals. Focused session on education for compensatory techniques for dressing. Pt donning/doffing shirt with Min A and underwear/pants with Min Guard A. Cues for adherence to sternal precautions. Pt performing functional mobility in hallway and toilet transfer with Min Guard A. Due to pt's increased functional performance, balance, and strength, recommend dc to home with follow up with OP PT/cardiac rehab. Will continue to follow acutely as admitted.   ? ?Recommendations for follow up therapy are one component of a multi-disciplinary discharge planning process, led by the attending physician.  Recommendations may be updated based on patient status, additional functional criteria and insurance authorization. ?   ?Follow Up Recommendations ? No OT follow up  ?  ?Assistance Recommended at Discharge Frequent or constant Supervision/Assistance  ?Patient can return home with the following ? A little help with walking and/or transfers;A little help with bathing/dressing/bathroom ?  ?Equipment Recommendations ? BSC/3in1  ?  ?Recommendations for Other Services   ? ?  ?Precautions / Restrictions Precautions ?Precautions: Sternal;Fall ?Precaution Booklet Issued: Yes (comment) ?Restrictions ?Weight Bearing Restrictions: Yes ?Other Position/Activity Restrictions: sternal precautions  ? ? ?  ? ?Mobility Bed Mobility ?Overal bed mobility: Modified Independent ?  ?  ?  ?  ?  ?   ?General bed mobility comments: No physical assist required, HOB elevated ?  ? ?Transfers ?Overall transfer level: Needs assistance ?Equipment used: None ?Transfers: Sit to/from Stand ?Sit to Stand: Min guard ?  ?  ?  ?  ?  ?General transfer comment: cues for hand placement, does not need to rock forward any longer and can power up on first bout, initial min guard to steady ?  ?  ?Balance Overall balance assessment: Needs assistance ?Sitting-balance support: Feet supported ?Sitting balance-Leahy Scale: Good ?  ?  ?Standing balance support: No upper extremity supported, During functional activity ?Standing balance-Leahy Scale: Good ?Standing balance comment: Able to ambulate without UE support ?  ?  ?  ?  ?  ?  ?  ?  ?  ?  ?  ?   ? ?ADL either performed or assessed with clinical judgement  ? ?ADL Overall ADL's : Needs assistance/impaired ?  ?  ?  ?  ?  ?  ?  ?  ?Upper Body Dressing : Minimal assistance;Sitting ?Upper Body Dressing Details (indicate cue type and reason): Educating on donning shirt while adhering to sternal precautions. Pt requiring MIn A but the shirt from home was also tight ?Lower Body Dressing: Min guard;Sit to/from stand ?Lower Body Dressing Details (indicate cue type and reason): Pt donning and doffing underwear, pants, and shoes with MIn guard A for safety and cues for adherance to precautions ?Toilet Transfer: Min guard;Ambulation;Regular Toilet ?  ?  ?  ?  ?  ?Functional mobility during ADLs: Min guard ?General ADL Comments: Focused session on dressing and mobility in hallway. Pt continues to present with decreased acitivy tolerance but very motivated and demonstrating increased strength and balance. ?  ? ?Extremity/Trunk Assessment Upper Extremity Assessment ?  Upper Extremity Assessment: Generalized weakness ?  ?Lower Extremity Assessment ?Lower Extremity Assessment: Defer to PT evaluation ?  ?  ?  ? ?Vision   ?  ?  ?Perception   ?  ?Praxis   ?  ? ?Cognition Arousal/Alertness:  Awake/alert ?Behavior During Therapy: [facility] for tasks assessed/performed ?Overall Cognitive Status: Impaired/Different from baseline ?Area of Impairment: Problem solving ?  ?  ?  ?  ?  ?  ?  ?  ?  ?  ?  ?  ?  ?  ?Problem Solving: Requires verbal cues, Slow processing ?General Comments: Requiring Min-Mod cues throughout for adherance to precautions during ADLs ?  ?  ?   ?Exercises   ? ?  ?Shoulder Instructions   ? ? ?  ?General Comments husband present. SpO2 96% on RA. HR 90-110s  ? ? ?Pertinent Vitals/ Pain       Pain Assessment ?Pain Assessment: Faces ?Faces Pain Scale: Hurts a little bit ?Pain Location: sternum ?Pain Descriptors / Indicators: Discomfort, Operative site guarding ?Pain Intervention(s): Monitored during session, Limited activity within patient's tolerance, Repositioned ? ?Home Living   ?  ?  ?  ?  ?  ?  ?  ?  ?  ?  ?  ?  ?  ?  ?  ?  ?  ?  ? ?  ?Prior Functioning/Environment    ?  ?  ?  ?   ? ?Frequency ? Min 2X/week  ? ? ? ? ?  ?Progress Toward Goals ? ?OT Goals(current goals can now be found in the care plan section) ? Progress towards OT goals: Progressing toward goals ? ?Acute Rehab OT Goals ?OT Goal Formulation: With patient/family ?Time For Goal Achievement: 02/24/22 ?Potential to Achieve Goals: Good ?ADL Goals ?Pt Will Perform Upper Body Dressing: with min assist;sitting ?Pt Will Perform Lower Body Dressing: with min assist;with adaptive equipment;with caregiver independent in assisting;sit to/from stand ?Pt Will Transfer to Toilet: with min assist;bedside commode;stand pivot transfer ?Pt Will Perform Toileting - Clothing Manipulation and hygiene: with min assist;with caregiver independent in assisting;with adaptive equipment;sitting/lateral leans;sit to/from stand ?Additional ADL Goal #1: Pt will demonstrate selective attention to ADLs with Min cues  ?Plan Discharge plan needs to be updated   ? ?Co-evaluation ? ? ?   ?  ?  ?  ?  ? ?  ?AM-PAC OT "6 Clicks" Daily Activity     ?Outcome Measure ? ?  Help from another person eating meals?: A Little ?Help from another person taking care of personal grooming?: A Lot ?Help from another person toileting, which includes using toliet, bedpan, or urinal?: A Lot ?Help from another person bathing (including washing, rinsing, drying)?: A Lot ?Help from another person to put on and taking off regular upper body clothing?: A Lot ?Help from another person to put on and taking off regular lower body clothing?: A Lot ?6 Click Score: 13 ? ?  ?End of Session Equipment Utilized During Treatment: Rolling [place] (2 wheels) ? ?OT Visit Diagnosis: Unsteadiness on feet (R26.81);Other abnormalities of gait and mobility (R26.89);Muscle weakness (generalized) (M62.81) ?  ?Activity Tolerance Patient tolerated treatment well ?  ?Patient Left with call bell/phone within reach;with family/visitor present (toilet - NT switching to assist back to bed) ?  ?Nurse Communication Mobility status ?  ? ?   ? ?Time: 3016-0109 ?OT Time Calculation (min): 37 min ? ?Charges: OT General Charges ?$OT Visit: 1 Visit ?OT Treatments ?$Self Care/Home Management : 23-37 mins ? ?[place],  OTR/L ?Acute Rehab ?Pager: [phone] ?Office: [phone] ? ?Angie Hogg M Kristan Votta ?02/16/2022, 3:01 PM ? ? ?

## 2022-02-16 NOTE — Progress Notes (Signed)
10 Days Post-Op Procedure(s) (LRB): ?CORONARY ARTERY BYPASS GRAFTING X4, Using Left and Right Greater Saphenous Vein Harvested Endoscopically (N/A) ?MITRAL VALVE (MV) REPLACEMENT Using 25MM Mitris Valve (N/A) ?TRANSESOPHAGEAL ECHOCARDIOGRAM (TEE) (N/A) ?Subjective: ?Continues to feel better. Walked this am. Eating, working on IS and getting to 1000. ? ?Objective: ?Vital signs in last 24 hours: ?Temp:  [97.8 ?F (36.6 ?C)-98.7 ?F (37.1 ?C)] 97.8 ?F (36.6 ?C) (04/17 0400) ?Pulse Rate:  [88-101] 95 (04/17 0700) ?Cardiac Rhythm: Normal sinus rhythm (04/17 0500) ?Resp:  [14-32] 15 (04/17 0700) ?BP: (90-141)/(46-120) 141/120 (04/17 0700) ?SpO2:  [91 %-100 %] 100 % (04/17 0700) ?Weight:  [57.8 kg] 57.8 kg (04/17 0500) ? ?Hemodynamic parameters for last 24 hours: ?CVP:  [2 mmHg-10 mmHg] 10 mmHg ? ?Intake/Output from previous day: ?04/16 0701 - 04/17 0700 ?In: 73 [P.O.:390; IV Piggyback:386] ?Out: 1280 [Urine:1280] ?Intake/Output this shift: ?No intake/output data recorded. ? ?General appearance: alert and cooperative ?Neurologic: intact ?Heart: regular rate and rhythm, S1, S2 normal, no murmur ?Lungs: diminished breath sounds bibasilar ?Abdomen: soft, non-tender; bowel sounds normal ?Extremities: no edema ?Wound: incision healing well ? ?Lab Results: ?Recent Labs  ?  02/15/22 ?0400 02/16/22 ?0053  ?WBC 27.2* 29.1*  ?HGB 9.9* 9.1*  ?HCT 31.0* 28.5*  ?PLT 936* 813*  ? ?BMET:  ?Recent Labs  ?  02/15/22 ?0400 02/16/22 ?0053  ?NA 134* 135  ?K 3.5 4.2  ?CL 86* 93*  ?CO2 38* 34*  ?GLUCOSE 117* 123*  ?BUN 21 33*  ?CREATININE 1.09* 1.11*  ?CALCIUM 8.8* 8.7*  ?  ?PT/INR:  ?Recent Labs  ?  02/16/22 ?0053  ?LABPROT 21.2*  ?INR 1.9*  ? ?ABG ?   ?Component Value Date/Time  ? PHART 7.557 (H) 02/10/2022 1941  ? HCO3 38.0 (H) 02/10/2022 7408  ? TCO2 39 (H) 02/10/2022 1448  ? ACIDBASEDEF 3.0 (H) 02/07/2022 1010  ? O2SAT 56.9 02/15/2022 0840  ? ?CBG (last 3)  ?Recent Labs  ?  02/15/22 ?1930 02/15/22 ?2328 02/16/22 ?0454  ?GLUCAP 150* 118*  123*  ? ? ?Assessment/Plan: ?S/P Procedure(s) (LRB): ?CORONARY ARTERY BYPASS GRAFTING X4, Using Left and Right Greater Saphenous Vein Harvested Endoscopically (N/A) ?MITRAL VALVE (MV) REPLACEMENT Using 25MM Mitris Valve (N/A) ?TRANSESOPHAGEAL ECHOCARDIOGRAM (TEE) (N/A) ? ?POD 10 ? ?Hemodynamically stable on midodrine 2.5 tid. Maintaining sinus rhythm on amio po. EF 35-40% on echo last week. ? ?She is probably euvolemic with wt below preop, no edema, contraction alkalosis with rising BUN. I would consider holding diuretic but see what DB wants to do. ? ?Leukocytosis with no fever, normal PCT and lactic acid. Started on vanc and zosyn empirically for possibly RLL pneumonia seen on CT. Continue IS, ambulation. Cultures of blood negative so far. ? ?Encourage nutrition and mobilization, PT.  ? ?Can transfer to stepdown when ok with DB. I would rather have her on 4E. ? ? ? ? LOS: 13 days  ? ? ?[name] ?02/16/2022 ? ? ?

## 2022-02-16 NOTE — Progress Notes (Addendum)
?  ? ? Advanced Heart Failure Rounding Note ? ?PCP-Cardiologist: None  ? ?Subjective:   ? ?76 y/o with severe CAD/NSTEMI and iCM EF 30-35%. Developed refractory angina and shock with respiratory failure. Intubated and IABP placed.  ?4/7 CABG + MVR  ?4/8 Extubated, IABP removed  ?04/11 AFL >>converted shortly after starting amio gtt ? ? ?WBC 17.9>27>29K. AF. Procalcitonin < 0.10. Lactic acid okay. BC X 2 pending. Now on vanc + zosyn d/t concern for possible PNA. PICC line out. ? ?CT chest 04/16 with small cluster peribronchovascular nodularity in RLL c/w mild infectious or inflammatory bronchiolitis ? ?Scr 1.11, BUN trending up. Weight 127 lb, pre-op 128 lb. ? ?Feels okay. No dyspnea, orthopnea or PND. Ambulated halls this am. Appetite seems to be improving. ? ? ?Limited echo 04/12: ?EF 35-40%, RMA, RV okay, mean gradient 7 across mitral valve prosthesis, large density in LA cavity that likely represents artifact  ? ? ?Objective:   ?Weight Range: ?57.8 kg ?Body mass index is 21.87 kg/m?.  ? ?Vital Signs:   ?Temp:  [97.8 ?F (36.6 ?C)-98.7 ?F (37.1 ?C)] 98 ?F (36.7 ?C) (04/17 0746) ?Pulse Rate:  [88-101] 95 (04/17 0700) ?Resp:  [14-32] 15 (04/17 0700) ?BP: (90-141)/(46-120) 141/120 (04/17 0700) ?SpO2:  [91 %-100 %] 100 % (04/17 0700) ?Weight:  [57.8 kg] 57.8 kg (04/17 0500) ?Last BM Date : 02/13/22 ? ?Weight change: ?Filed Weights  ? 02/14/22 0500 02/15/22 0600 02/16/22 0500  ?Weight: 58.4 kg 57.1 kg 57.8 kg  ? ? ?Intake/Output:  ? ?Intake/Output Summary (Last 24 hours) at 02/16/2022 0806 ?Last data filed at 02/16/2022 0700 ?Gross per 24 hour  ?Intake 776.04 ml  ?Output 1180 ml  ?Net -403.96 ml  ?  ? ? ?Physical Exam  ? ?General: No distress. Sitting up in bed. ?HEENT: normal ?Neck: supple. no JVD. Carotids 2+ bilat; no bruits.  ?Cor: PMI nondisplaced. Regular rate & rhythm. No rubs, gallops or murmurs. Sternum stable. ?Lungs: clear ?Abdomen: soft, nontender, nondistended.  ?Extremities: no cyanosis, clubbing, rash,  edema ?Neuro: alert & orientedx3, cranial nerves grossly intact. moves all 4 extremities w/o difficulty. Affect pleasant ? ? ?Telemetry  ? ?SR 90s-100s, variable PVCs (up to 10-20/min at times) ? ?Labs  ?  ?CBC ?Recent Labs  ?  02/15/22 ?0400 02/16/22 ?0053  ?WBC 27.2* 29.1*  ?HGB 9.9* 9.1*  ?HCT 31.0* 28.5*  ?MCV 89.9 88.2  ?PLT 936* 813*  ? ?Basic Metabolic Panel ?Recent Labs  ?  02/15/22 ?0400 02/16/22 ?0053  ?NA 134* 135  ?K 3.5 4.2  ?CL 86* 93*  ?CO2 38* 34*  ?GLUCOSE 117* 123*  ?BUN 21 33*  ?CREATININE 1.09* 1.11*  ?CALCIUM 8.8* 8.7*  ?MG 2.3 2.3  ? ?Liver Function Tests ?No results for input(s): AST, ALT, ALKPHOS, BILITOT, PROT, ALBUMIN in the last 72 hours. ? ?No results for input(s): LIPASE, AMYLASE in the last 72 hours. ?Cardiac Enzymes ?No results for input(s): CKTOTAL, CKMB, CKMBINDEX, TROPONINI in the last 72 hours. ? ?BNP: ?BNP (last 3 results) ?Recent Labs  ?  02/02/22 ?2321  ?BNP 366.4*  ? ? ?ProBNP (last 3 results) ?No results for input(s): PROBNP in the last 8760 hours. ? ? ?D-Dimer ?No results for input(s): DDIMER in the last 72 hours. ?Hemoglobin A1C ?No results for input(s): HGBA1C in the last 72 hours. ? ?Fasting Lipid Panel ?No results for input(s): CHOL, HDL, LDLCALC, TRIG, CHOLHDL, LDLDIRECT in the last 72 hours. ? ?Thyroid Function Tests ?No results for input(s): TSH, T4TOTAL, T3FREE, THYROIDAB in  the last 72 hours. ? ?Invalid input(s): FREET3 ? ? ?Other results: ? ? ?Imaging  ? ? ?CT CHEST WO CONTRAST ? ?Result Date: 02/15/2022 ?CLINICAL DATA:  Pneumonia. EXAM: CT CHEST WITHOUT CONTRAST TECHNIQUE: Multidetector CT imaging of the chest was performed following the standard protocol without IV contrast. RADIATION DOSE REDUCTION: This exam was performed according to the departmental dose-optimization program which includes automated exposure control, adjustment of the mA and/or kV according to patient size and/or use of iterative reconstruction technique. COMPARISON:  Chest x-ray from same day.  FINDINGS: Cardiovascular: Mild cardiomegaly status post CABG and mitral valve replacement. No pericardial effusion. No thoracic aortic aneurysm. Coronary, aortic arch, and branch vessel atherosclerotic vascular disease. Mediastinum/Nodes: No enlarged mediastinal or axillary lymph nodes. Thyroid gland, trachea, and esophagus demonstrate no significant findings. Lungs/Pleura: Small pleural effusions, slightly greater on the right. Adjacent volume loss and right greater than left lower lobe atelectasis. No consolidation or pneumothorax. Small cluster of peribronchovascular nodularity in the superior segment of the right lower lobe (series 5, image 92). Upper Abdomen: No acute abnormality. Musculoskeletal: No acute or significant osseous findings. IMPRESSION: 1. Small cluster of peribronchovascular nodularity in the superior segment of the right lower lobe, consistent with mild infectious or inflammatory bronchiolitis. 2. Small pleural effusions, slightly greater on the right, with adjacent volume loss and atelectasis. 3. Aortic Atherosclerosis (ICD10-I70.0). Electronically Signed   By: Titus Dubin M.D.   On: 02/15/2022 14:01   ? ? ?Medications:   ? ? ?Scheduled Medications: ? amiodarone  200 mg Oral BID  ? aspirin EC  81 mg Oral Daily  ? bisacodyl  10 mg Oral Daily  ? Or  ? bisacodyl  10 mg Rectal Daily  ? Chlorhexidine Gluconate Cloth  6 each Topical Daily  ? [facility], Patient & Family Education   Does not apply Once  ? docusate sodium  200 mg Oral Daily  ? enoxaparin (LOVENOX) injection  40 mg Subcutaneous QHS  ? feeding supplement  237 mL Oral BID BM  ? feeding supplement (GLUCERNA SHAKE)  237 mL Oral BID BM  ? fluticasone  1 spray Each Nare Daily  ? insulin aspart  0-15 Units Subcutaneous TID WC  ? insulin detemir  10 Units Subcutaneous Daily  ? levothyroxine  25 mcg Oral Q0600  ? mouth rinse  15 mL Mouth Rinse BID  ? melatonin  3 mg Oral QHS  ? midodrine  2.5 mg Oral TID WC  ? multivitamin  with minerals  1 tablet Oral Daily  ? pantoprazole  40 mg Oral Daily  ? Ensure Max Protein  11 oz Oral QHS  ? rosuvastatin  5 mg Oral Daily  ? scopolamine  1 patch Transdermal Q72H  ? sodium chloride flush  10-40 mL Intracatheter Q12H  ? sodium chloride flush  3 mL Intravenous Q12H  ? spironolactone  12.5 mg Oral Daily  ? torsemide  40 mg Oral Daily  ? warfarin  5 mg Oral q1600  ? Warfarin - Physician Dosing Inpatient   Does not apply q1600  ? ? ?Infusions: ? sodium chloride    ? piperacillin-tazobactam (ZOSYN)  IV 12.5 mL/hr at 02/16/22 0700  ? vancomycin    ? ? ?PRN Medications: ?sodium chloride, bismuth subsalicylate, Gerhardt's butt cream, oxyCODONE, prochlorperazine, promethazine, simethicone, sodium chloride flush, sodium chloride flush, traMADol ? ? ? ?Patient Profile  ? ?76 y/o with severe CAD/NSTEMI and iCM EF 30-35%. Developed refractory angina and shock yesterday with respiratory failure. Intubated and  IABP placed. Taken to OR 4/7 for CABG + MVR  ? ?Assessment/Plan  ? ?1. NSTEMI---> Cardiogenic Shock ?- HS Trop 700>1749> 6513 ?- Cath with 3V disease, elevated wedge, and reduced cardiac output. IABP placed in lab with Norepi/Milrinone.  ?- 4/7 s/p CABG + MVR ?- On ASA 325. Would typically give plavix post NSTEMI, but also on warfarin with recent MVR. ?- No s/s angina. Continue statin ?- Cardiac rehab  ?  ?2. Acute Hypoxemic Respiratory Failure ?- Extubated 4/8. ?- stable ? ?3. Acute HFrEF, ICM  -> cardiogenic shock ?- Echo EF 30-35% RV ok. Severe MR. EF improved on post CABG TEE. ?- Limited echo 004/12: EF 35-40%, RV okay, large density in LA cavity (? Thrombus). Will review with Dr. Haroldine Laws to decide on additional imaging. ?- Now s/p CABG ?- Off Milrinone, NE weaned off 04/13. IABP out ?- Co-ox had been labile. PICC now out d/t concern for infection. ?- On midodrine 2.5 mg TID for BP support.  ?- Volume looks good on exam. BUN beginning to trend up and she is now below pre-op weight. Will hold  Torsemide. Continue to watch volume status ?- Continue spiro 12.5 ?- Consider losartan tomorrow if BP stable. ?- Titrate GDMT as tolerated. SBP ranging 90s-110s ? ?4. Severe MR  ?- S/p MV replacement with 25 mm M

## 2022-02-16 NOTE — Progress Notes (Signed)
Patient ID: Tonya Hamilton, female   DOB: Nov 06, 1945, 76 y.o.   MRN: [id number] ? ?TCTS Evening Rounds: ? ?Hemodynamically stable in sinus rhythm. ? ?Ambulated two more times today. ? ?Feels better. ? ?Plan to transfer to floor tomorrow if no changes. ?

## 2022-02-16 NOTE — Assessment & Plan Note (Signed)
In sinus rhythm now.  ? ?-Continue on oral amiodarone.  ?

## 2022-02-16 NOTE — Progress Notes (Signed)
Nutrition Follow-up ? ?DOCUMENTATION CODES:  ? ?Not applicable ? ?INTERVENTION:  ? ?Continue Ensure Enlive po BID, each supplement provides 350 kcal and 20 grams of protein. Pt to continue for at least 2 weeks post discharge ? ?D/C Ensure Max daily ?D/C Glucerna Shakes BID ? ?Continue MVI with Minerals daily ? ?Change diet to Heart Healthy  ? ?NUTRITION DIAGNOSIS:  ? ?Inadequate oral intake related to acute illness as evidenced by NPO status. ? ?Being addressed via diet advancement, supplements ? ?GOAL:  ? ?Patient will meet greater than or equal to 90% of their needs ? ?Progressing ? ?MONITOR:  ? ?PO intake, Supplement acceptance, Labs, Weight trends, Skin, I & O's ? ?REASON FOR ASSESSMENT:  ? ?Ventilator, Consult ?Enteral/tube feeding initiation and management ? ?ASSESSMENT:  ? ?76 yo female admitted with NSTEMI with acute cardiogenic shock, acute respiratory failure requiring intubation. PMH includes HTN, CAD, ischemic CM, diverticulitis, GERD ? ?4/04 Cath Lab: severe 3V CAD, Intubated ?4/05 IABP ?4/07 CABG x 4, MV replacement ?4/08 Extubated, IABP removed ? ?Pt alert, appears to be in good spirits on visit today. Husband at bedside. Noted pt plans to go to Outpatient Cardiac Rehab. Per PT, pt is progressing well post-op and will not require Inpatient Rehab.  ? ?Recorded po intake 5-100% of meals; appetite improving. Pt ate grilled cheese sandwich and soup for lunch today. Pt reports she never eats 3 meals at home; this is not her normal eating pattern. Pt also drinking some of the nutrition shakes; noted pt with orders for Glucerna, Ensure Max and Ensure Enlive; pt reports Ensure Enlive, RD to discontinue to others ? ?Pt plans to continue Ensure type supplement post discharge, at least initially. Agree that this is a good plan ? ?Current wt 57.8 kg; admit weight 59.9 kg. Net negative for the admission  ? ?Labs: HgbA1c 5.7, CBGs 118-150 ?Meds: ss novolog, levemir, MVI, colace ? ?Diet Order:   ?Diet Order   ? ?        ?  Diet Carb Modified Fluid consistency: Thin; Room service appropriate? Yes  Diet effective now       ?  ? ?  ?  ? ?  ? ? ?EDUCATION NEEDS:  ? ?Not appropriate for education at this time ? ?Skin:  Skin Assessment: Skin Integrity Issues: ?Skin Integrity Issues:: Incisions ?Incisions: closed chest, rt leg, lt leg ? ?Last BM:  4/14 ? ?Height:  ? ?Ht Readings from Last 1 Encounters:  ?02/09/22 '5\' 4"'$  (1.626 m)  ? ? ?Weight:  ? ?Wt Readings from Last 1 Encounters:  ?02/16/22 57.8 kg  ? ? ? ?BMI:  Body mass index is 21.87 kg/m?. ? ?Estimated Nutritional Needs:  ? ?Kcal:  1800-2000 ? ?Protein:  100-115 grams ? ?Fluid:  > 1.8 L ? ?Kerman Passey MS, RDN, LDN, CNSC ?Registered Dietitian III ?Clinical Nutrition ?RD Pager and On-Call Pager Number Located in [place]  ? ?

## 2022-02-16 NOTE — Progress Notes (Signed)
?  Inpatient Rehabilitation Admissions Coordinator  ? ?Patient has progressed to a functional level to no longer need a CIR admit before discharge.I met with patient and her spouse at bedside and they are in agreement. They would like to pursue outpatient at this time.I have alerted acute team and TOC. We will sign off at this time. ? ?Danne Baxter, RN, MSN ?Rehab Admissions Coordinator ?[phone] ?02/16/2022 10:53 AM ? ?

## 2022-02-16 NOTE — Assessment & Plan Note (Addendum)
Persistent leukocytosis since admission with nadir of 15.0 ?Now 29.1. Was normal as of 9/16/ 2021 ?Persistent thrombocytosis as well.  ?No clear source of infection, negative procalcitonin today again but CRP elevated at 5.8 and ESR up to 32.  ?Blood film shows reactive marrow.  ? ?- Stop antibiotics after 5 days.  ?-  Inflammatory response to MI/surgery. Should resolve over time and would only consider hematology consultation if it persists as an outpatient.  ?- Continue colchicine for at least 5 days. ?

## 2022-02-16 NOTE — Assessment & Plan Note (Signed)
Persistent leukocytosis with small pleural and pericardial effusions on CT ?PCT negative  ? ?- Suspect pericardial injury syndrome from MI ([facility]) and recent pericardiotomy ?- Trial of colchicine ?- Low threshold to stop antibiotics.  ?

## 2022-02-17 ENCOUNTER — Other Ambulatory Visit (HOSPITAL_COMMUNITY): Payer: Self-pay

## 2022-02-17 DIAGNOSIS — I2510 Atherosclerotic heart disease of native coronary artery without angina pectoris: Secondary | ICD-10-CM | POA: Diagnosis not present

## 2022-02-17 DIAGNOSIS — R570 Cardiogenic shock: Secondary | ICD-10-CM | POA: Diagnosis not present

## 2022-02-17 DIAGNOSIS — J9601 Acute respiratory failure with hypoxia: Secondary | ICD-10-CM | POA: Diagnosis not present

## 2022-02-17 LAB — BASIC METABOLIC PANEL
Anion gap: 18 — ABNORMAL HIGH (ref 5–15)
BUN: 33 mg/dL — ABNORMAL HIGH (ref 8–23)
CO2: 25 mmol/L (ref 22–32)
Calcium: 8.3 mg/dL — ABNORMAL LOW (ref 8.9–10.3)
Chloride: 92 mmol/L — ABNORMAL LOW (ref 98–111)
Creatinine, Ser: 0.94 mg/dL (ref 0.44–1.00)
GFR, Estimated: >60 mL/min (ref >60)
Glucose, Bld: 120 mg/dL — ABNORMAL HIGH (ref 70–99)
Potassium: 3.5 mmol/L (ref 3.5–5.1)
Sodium: 135 mmol/L (ref 135–145)

## 2022-02-17 LAB — GLUCOSE, CAPILLARY
Glucose-Capillary: 100 mg/dL — ABNORMAL HIGH (ref 70–99)
Glucose-Capillary: 104 mg/dL — ABNORMAL HIGH (ref 70–99)
Glucose-Capillary: 136 mg/dL — ABNORMAL HIGH (ref 70–99)
Glucose-Capillary: 161 mg/dL — ABNORMAL HIGH (ref 70–99)
Glucose-Capillary: 164 mg/dL — ABNORMAL HIGH (ref 70–99)

## 2022-02-17 LAB — PROTIME-INR
INR: 2.1 — ABNORMAL HIGH (ref 0.8–1.2)
Prothrombin Time: 23 seconds — ABNORMAL HIGH (ref 11.4–15.2)

## 2022-02-17 LAB — PROCALCITONIN: Procalcitonin: <0.1 ng/mL

## 2022-02-17 LAB — MAGNESIUM: Magnesium: 2.3 mg/dL (ref 1.7–2.4)

## 2022-02-17 MED ORDER — ~~LOC~~ CARDIAC SURGERY, PATIENT & FAMILY EDUCATION: Freq: Once | Status: AC

## 2022-02-17 MED ORDER — POTASSIUM CHLORIDE CRYS ER 20 MEQ PO TBCR
30.0000 meq | EXTENDED_RELEASE_TABLET | Freq: Once | ORAL | Status: AC
  Administered 2022-02-17: 30 meq via ORAL
  Filled 2022-02-17: qty 1

## 2022-02-17 MED ORDER — SODIUM CHLORIDE 0.9% FLUSH
3.0000 mL | INTRAVENOUS | Status: DC | PRN
Start: 2022-02-17 — End: 2022-02-19

## 2022-02-17 MED ORDER — SODIUM CHLORIDE 0.9% FLUSH
3.0000 mL | Freq: Two times a day (BID) | INTRAVENOUS | Status: DC
  Administered 2022-02-17 – 2022-02-19 (×3): 3 mL via INTRAVENOUS

## 2022-02-17 MED ORDER — SODIUM CHLORIDE 0.9 % IV SOLN: 250.0000 mL | INTRAVENOUS | Status: DC | PRN

## 2022-02-17 NOTE — Progress Notes (Signed)
EVENING ROUNDS NOTE : ? ?   ?[address] ?      [place] [address] ?            [phone]   ?              ?11 Days Post-Op ?Procedure(s) (LRB): ?CORONARY ARTERY BYPASS GRAFTING X4, Using Left and Right Greater Saphenous Vein Harvested Endoscopically (N/A) ?MITRAL VALVE (MV) REPLACEMENT Using 25MM Mitris Valve (N/A) ?TRANSESOPHAGEAL ECHOCARDIOGRAM (TEE) (N/A) ? ? ?Total Length of Stay:  LOS: 14 days  ?Events:   ?No events ?In bed ?Awaiting floor bed ? ? ? ?BP (!) 106/51 (BP Location: Left Arm)   Pulse 91   Temp 98.6 ?F (37 ?C) (Oral)   Resp 15   Ht '5\' 4"'$  (1.626 m)   Wt 58 kg   SpO2 94%   BMI 21.94 kg/m?  ? ?  ? ?  ? ? sodium chloride    ? piperacillin-tazobactam (ZOSYN)  IV Stopped (02/17/22 1341)  ? ? ?I/O last 3 completed shifts: ?In: 1953.5 [P.O.:1584; I.V.:6; IV Piggyback:363.5] ?Out: 1550 [[id number]] ? ? ? ?  Latest Ref Rng & Units 02/16/2022  ? 12:53 AM 02/15/2022  ?  4:00 AM 02/14/2022  ?  3:55 AM  ?CBC  ?WBC 4.0 - 10.5 K/uL 29.1   27.2   17.9    ?Hemoglobin 12.0 - 15.0 g/dL 9.1   9.9   9.0    ?Hematocrit 36.0 - 46.0 % 28.5   31.0   27.6    ?Platelets 150 - 400 K/uL 813   936   730    ? ? ? ?  Latest Ref Rng & Units 02/17/2022  ? 12:54 AM 02/16/2022  ? 12:53 AM 02/15/2022  ?  4:00 AM  ?BMP  ?Glucose 70 - 99 mg/dL 120   123   117    ?BUN 8 - 23 mg/dL 33   33   21    ?Creatinine 0.44 - 1.00 mg/dL 0.94   1.11   1.09    ?Sodium 135 - 145 mmol/L 135   135   134    ?Potassium 3.5 - 5.1 mmol/L 3.5   4.2   3.5    ?Chloride 98 - 111 mmol/L 92   93   86    ?CO2 22 - 32 mmol/L 25   34   38    ?Calcium 8.9 - 10.3 mg/dL 8.3   8.7   8.8    ? ? ?ABG ?   ?Component Value Date/Time  ? PHART 7.557 (H) 02/10/2022 4332  ? PCO2ART 42.7 02/10/2022 0204  ? PO2ART 103 02/10/2022 0204  ? HCO3 38.0 (H) 02/10/2022 9518  ? TCO2 39 (H) 02/10/2022 8416  ? ACIDBASEDEF 3.0 (H) 02/07/2022 1010  ? O2SAT 56.9 02/15/2022 0840  ? ? ? ? ? ?Melodie Bouillon, MD ?02/17/2022 5:41 PM ? ? ?

## 2022-02-17 NOTE — Progress Notes (Signed)
? ?NAME:  Tonya Hamilton, MRN:  [id number], DOB:  [birth year], LOS: 25 ?ADMISSION DATE:  02/02/2022, CONSULTATION DATE:  02/03/22 ?REFERRING MD:  Christy Gentles CHIEF COMPLAINT:  Dyspnea  ? ?History of Present Illness:  ?Tonya Hamilton is a 76 y.o. female who has a PMH as outlined below.  She presented to [facility] ED 4/3 with dyspnea.  She states that symptoms began suddenly and progressed.  She also endorsed cough intimately productive of sputum.  No fevers/chills/sweats, chest pain, N/V/D, abdominal pain, myalgias, exposures known sick contacts, recent travel. ? ?In ED, she was found to be hypoxic.  CXR showed bilateral opacities and bilateral mild edema.  Troponin was elevated at 708 with repeat up to 1454.  BNP mildly elevated at 366.  EKG with ST depressions and global ischemia.  Leukocytosis to 23.6.  She was initially placed on Ventimask but was later upgraded to BiPAP.  She continued to require BiPAP which she did feel subjective improvement in symptoms.  Patient stable subsequently called for transfer to ICU at [facility] for further evaluation management. ? ?Pertinent  Medical History:  ?has History of colonic diverticulitis; Osteoarthritis; Hypertension; Hyperlipidemia; Hypothyroid; GERD (gastroesophageal reflux disease); Osteopenia; Anxiety; Osteoarthritis of right hip; Angioedema; Chronic intractable headache; Occipital neuralgia of left side; Neck pain; Hyperreflexia; Arthritis, multiple joint involvement; Spasmodic dysphonia; Non-STEMI (non-ST elevated myocardial infarction) ([place]); Severe mitral regurgitation; Ischemic cardiomyopathy; Multiple vessel coronary artery disease; S/P CABG x 4; Leukocytosis; and Atrial flutter ([place]) on their problem list. ? ?Significant Hospital Events: ?Including procedures, antibiotic start and stop dates in addition to other pertinent events   ?4/4 admit, started on Cefepime and Azith and Bipap.  Trop rising, on heparin gtt,  Echo with reduced EF and regional wall motion  abnormalities with severe MR, cardiology consulted and taken to cath lab which revealed severe LM and 3V disease.  IABP placed and intubated, in cardiogenic shock ?4/6 Lying in bed on IABP with progressive stability seen, plan for CABG with MVR tomorrow  ?4/7 OR to CABG and MVR, no issues overnight  ?4/8 Extubated and IABP removed.  Chest tubes removed ?4/9 up to chair. ?4/10 improved UOP with metolazone and increased lasix ?4/13 PICC placed ?4/17 worsening leukocytosis. ?4/18 up for transfer to the floor.  ? ?Interim History / Subjective:  ?Patient is awake and in very good spirits.  Has been up and ambulating.  ? ?Objective:  ?Blood pressure 115/62, pulse 91, temperature 98.1 ?F (36.7 ?C), temperature source Oral, resp. rate 17, height _0  (1.626 m), weight 58 kg, SpO2 95 %. ?   ?   ? ?Intake/Output Summary (Last 24 hours) at 02/17/2022 0942 ?Last data filed at 02/17/2022 0800 ?Gross per 24 hour  ?Intake 1226.75 ml  ?Output 1000 ml  ?Net 226.75 ml  ? ? ?Filed Weights  ? 02/15/22 0600 02/16/22 0500 02/17/22 0500  ?Weight: 57.1 kg 57.8 kg 58 kg  ? ?Physical Exam  ?General:  frail-appearing woman sitting in bed.  ?HEENT: [place]/AT, eyes anicteric ?Neuro: Awake and alert, moving all extremities 4+/5 strength throughout.  ?CV: S1-S2, regular rate and rhythm. No rub.  ?PULM: no distress on room air with no crackles or bronchial breath sounds  ?GI: Soft, nontender, nondistended ?Extremities: No cyanosis, no significant edema ?Skin: No diffuse rashes, warm and dry.  Sternal incision healing well.  No erythema around left upper extremity PICC. ? ? ?Assessment & Plan:  ? ?Leukocytosis ?Persistent leukocytosis since admission with nadir of 15.0 ?Now 29.1. Was normal as of  9/16/ 2021 ?Persistent thrombocytosis as well.  ?No clear source of infection, negative procalcitonin today again but CRP elevated at 5.8 and ESR up to 32.  ?Blood film shows reactive marrow.  ? ?- Stop antibiotics after 5 days.  ?-  Inflammatory response to  MI/surgery. Should resolve over time and would only consider hematology consultation if it persists as an outpatient.  ?- Continue colchicine for at least 5 days. ? ?Community acquired pneumonia ?Suspected initially on admission, but likely simply had HF from MI ? ?Severe mitral regurgitation ?INR: 2.1 ? ?-Coumadin for 3 months, ASA only therafter ?-Stop Lovenox today ? ?Atrial flutter ([place]) ?In sinus rhythm now.  ? ?-Continue on oral amiodarone.  ? ?Hyperlipidemia ?Tolerating low-dose Crestor ? ?Multiple vessel coronary artery disease ?CORONARY ARTERY BYPASS GRAFTING X4, Using Left and Right Greater Saphenous Vein Harvested Endoscopically (N/A) . LIMA to LAD, SVG to D1, OM1, RCA ? ? ? ?Ischemic cardiomyopathy ?Repeat echo post operatively shows EF 35-40%  ?Off inotropic support, ScvO2 remain marginal 56.9 ?Creatinine has improved.  ? ?- continue midodrine and spironolactone. ?- blood pressure remains too marginal for betablocker or Entresto. Anticipate weaning off midodrine eventually (prior to discharge or as outpatient) ?  ? ?S/P CABG x 4 ?Persistent leukocytosis with small pleural and pericardial effusions on CT ?PCT negative  ? ?- Suspect pericardial injury syndrome from MI ([facility]ler's) and recent pericardiotomy ?- Trial of colchicine ?- Low threshold to stop antibiotics.   ? ?Best practice (evaluated daily):  ?Diet/type: Regular consistency (see orders) ?DVT prophylaxis: Prophylactic Lovenox until warfarin therapeutic.  ?GI prophylaxis: H2B and PPI ?Lines: Left PICC ?Foley:  N/A ?Code Status:  full code ?Last date of multidisciplinary goals of care discussion: Family updated at bedside 4/10 ? ?Kipp Brood, MD FRCPC ?ICU Physician ?[place]  ?Pager: [phone] ?Mobile: [phone] ?After hours: [phone]. ? ? ?

## 2022-02-17 NOTE — Progress Notes (Addendum)
?  ? ? Advanced Heart Failure Rounding Note ? ?PCP-Cardiologist: None  ? ?Subjective:   ? ?76 y/o with severe CAD/NSTEMI and iCM EF 30-35%. Developed refractory angina and shock with respiratory failure. Intubated and IABP placed.  ?4/7 CABG + MVR  ?4/8 Extubated, IABP removed  ?04/11 AFL >>converted shortly after starting amio gtt ? ? ?WBC 17.9>27>29K. AF. Procalcitonin < 0.10. BC X 2 pending. On empiric abx for possible PNA. Colchicine started for possible pericarditis. ? ?No BMET this am. Mag okay. More PVCs last couple of hours, ~ 20/min ? ?Feeling well. Has already ambulated a lap around the unit. No dyspnea.  ? ? ?Limited echo 04/12: ?EF 35-40%, RMA, RV okay, mean gradient 7 across mitral valve prosthesis, large density in LA cavity that likely represents artifact  ? ? ?Objective:   ?Weight Range: ?58 kg ?Body mass index is 21.94 kg/m?.  ? ?Vital Signs:   ?Temp:  [98.1 ?F (36.7 ?C)-98.7 ?F (37.1 ?C)] 98.1 ?F (36.7 ?C) (04/18 0400) ?Pulse Rate:  [87-103] 103 (04/18 0700) ?Resp:  [11-31] 19 (04/18 0700) ?BP: (94-116)/(46-89) 115/62 (04/18 0600) ?SpO2:  [90 %-100 %] 100 % (04/18 0700) ?Weight:  [58 kg] 58 kg (04/18 0500) ?Last BM Date : 02/16/22 ? ?Weight change: ?Filed Weights  ? 02/15/22 0600 02/16/22 0500 02/17/22 0500  ?Weight: 57.1 kg 57.8 kg 58 kg  ? ? ?Intake/Output:  ? ?Intake/Output Summary (Last 24 hours) at 02/17/2022 0803 ?Last data filed at 02/17/2022 0600 ?Gross per 24 hour  ?Intake 1218.72 ml  ?Output 1000 ml  ?Net 218.72 ml  ?  ? ? ?Physical Exam  ? ?General:  Sitting up in chair. ?HEENT: normal ?Neck: supple. no JVD. Carotids 2+ bilat; no bruits.  ?Cor: PMI nondisplaced. Regular rate & rhythm. No rubs, gallops or murmurs.Sternum stable. ?Lungs: clear ?Abdomen: soft, nontender, nondistended. No hepatosplenomegaly.  ?Extremities: no cyanosis, clubbing, rash, edema ?Neuro: alert & orientedx3, cranial nerves grossly intact. moves all 4 extremities w/o difficulty. Affect pleasant ? ? ? ?Telemetry   ? ?SR 90s- 100s, variable PVC burden, rare overnight, ~ 20/min last couple of hours ? ?Labs  ?  ?CBC ?Recent Labs  ?  02/15/22 ?0400 02/16/22 ?0053  ?WBC 27.2* 29.1*  ?HGB 9.9* 9.1*  ?HCT 31.0* 28.5*  ?MCV 89.9 88.2  ?PLT 936* 813*  ? ?Basic Metabolic Panel ?Recent Labs  ?  02/15/22 ?0400 02/16/22 ?0053 02/17/22 ?8657  ?NA 134* 135  --   ?K 3.5 4.2  --   ?CL 86* 93*  --   ?CO2 38* 34*  --   ?GLUCOSE 117* 123*  --   ?BUN 21 33*  --   ?CREATININE 1.09* 1.11*  --   ?CALCIUM 8.8* 8.7*  --   ?MG 2.3 2.3 2.3  ? ?Liver Function Tests ?No results for input(s): AST, ALT, ALKPHOS, BILITOT, PROT, ALBUMIN in the last 72 hours. ? ?No results for input(s): LIPASE, AMYLASE in the last 72 hours. ?Cardiac Enzymes ?No results for input(s): CKTOTAL, CKMB, CKMBINDEX, TROPONINI in the last 72 hours. ? ?BNP: ?BNP (last 3 results) ?Recent Labs  ?  02/02/22 ?2321  ?BNP 366.4*  ? ? ?ProBNP (last 3 results) ?No results for input(s): PROBNP in the last 8760 hours. ? ? ?D-Dimer ?No results for input(s): DDIMER in the last 72 hours. ?Hemoglobin A1C ?No results for input(s): HGBA1C in the last 72 hours. ? ?Fasting Lipid Panel ?No results for input(s): CHOL, HDL, LDLCALC, TRIG, CHOLHDL, LDLDIRECT in the last 72 hours. ? ?Thyroid  Function Tests ?No results for input(s): TSH, T4TOTAL, T3FREE, THYROIDAB in the last 72 hours. ? ?Invalid input(s): FREET3 ? ? ?Other results: ? ? ?Imaging  ? ? ?No results found. ? ? ?Medications:   ? ? ?Scheduled Medications: ? amiodarone  200 mg Oral BID  ? aspirin EC  81 mg Oral Daily  ? bisacodyl  10 mg Oral Daily  ? Or  ? bisacodyl  10 mg Rectal Daily  ? Chlorhexidine Gluconate Cloth  6 each Topical Daily  ? colchicine  0.6 mg Oral BID  ? [facility], Patient [facility] Family Education   Does not apply Once  ? docusate sodium  200 mg Oral Daily  ? feeding supplement  237 mL Oral BID BM  ? fluticasone  1 spray Each Nare Daily  ? insulin aspart  0-15 Units Subcutaneous TID WC  ? levothyroxine  25 mcg Oral  Q0600  ? mouth rinse  15 mL Mouth Rinse BID  ? melatonin  3 mg Oral QHS  ? midodrine  2.5 mg Oral TID WC  ? multivitamin with minerals  1 tablet Oral Daily  ? pantoprazole  40 mg Oral Daily  ? rosuvastatin  5 mg Oral Daily  ? sodium chloride flush  10-40 mL Intracatheter Q12H  ? sodium chloride flush  3 mL Intravenous Q12H  ? spironolactone  12.5 mg Oral Daily  ? warfarin  5 mg Oral q1600  ? Warfarin - Physician Dosing Inpatient   Does not apply q1600  ? ? ?Infusions: ? sodium chloride    ? piperacillin-tazobactam (ZOSYN)  IV 3.375 g (02/17/22 9371)  ? vancomycin Stopped (02/16/22 1815)  ? ? ?PRN Medications: ?sodium chloride, bismuth subsalicylate, Gerhardt's butt cream, oxyCODONE, prochlorperazine, promethazine, simethicone, sodium chloride flush, sodium chloride flush, traMADol ? ? ? ?Patient Profile  ? ?76 y/o with severe CAD/NSTEMI and iCM EF 30-35%. Developed refractory angina and shock yesterday with respiratory failure. Intubated and IABP placed. Taken to OR 4/7 for CABG + MVR  ? ?Assessment/Plan  ? ?1. NSTEMI---> Cardiogenic Shock ?- HS Trop 696>7893> 6513 ?- Cath with 3V disease, elevated wedge, and reduced cardiac output. IABP placed in lab with Norepi/Milrinone.  ?- 4/7 s/p CABG + MVR ?- On ASA 325. Would typically give plavix post NSTEMI, but also on warfarin with recent MVR. ?- No s/s angina. Continue statin ?- Cardiac rehab  ?  ?2. Acute Hypoxemic Respiratory Failure ?- Extubated 4/8. ?- stable ? ?3. Acute HFrEF, ICM  -> cardiogenic shock ?- Echo EF 30-35% RV ok. Severe MR. EF improved on post CABG TEE. ?- Limited echo 04/12: EF 35-40%, RV okay, large density in LA cavity likely artifact ?- Off Milrinone, NE weaned off 04/13. IABP out ?- On midodrine 2.5 mg TID for BP support.  ?- Volume looks good on exam. Off diuretics. ?- Titrate GDMT as tolerated. No room today, SBP 90s-100s ? ?4. Severe MR  ?- S/p MV replacement with 25 mm Mitris Resilia pericardial valve ?- Warfarin started per TCTS (will be on  for 3 months) ?- INR 2.1 ? ?5. PVCs ?- Variable on telemetry. Rare overnight, about 20/min last couple of hours ?- Mag okay. Will check BMET ?- On amio 200 mg BID for AFL. Continue. ?  ?6. Hypothyroidism  ?-On levothyroxine.  ?  ?7. AKI due to ATN/shock ?- resolved ?- monitor w/ diuretics ? ?8. Acute blood loss anemia ?- transfuse as needed to keep hgb >= 8.0 ?- Hgb stable at 9.1 yesterday ? ?  9. Leukocytosis ?- WBC 17-> 27 -> 29k ?- UA ok ?- PCT < 0.10, Lactic acid okay, BC NG so far.  ?- PICC line out ?- Vanc + zosyn for possible PNA, will complete 5 day course ?- Now on colchicine for possible pericarditis ? ?10. B/l Pleural Effusions ?- Improved with diuresis ?- lung u/s with IR 4/14 no significant effusion to tap ? ?11. Atrial Flutter ?- Currently SR 90s with 1st degree AVB  ?- Continue amiodarone 200 mg twice daily ?- on warfarin, INR 2.1 ? ? ? ?Transferring out of ICU to 4E ? ? ? ?Length of Stay: 14 ? ?FINCH, LINDSAY N, PA-C  ?02/17/2022, 8:03 AM ? ?Advanced Heart Failure Team ?Pager [phone] (M-F; 7a - 5p)  ?Please contact [facility] for night-coverage after hours (5p -7a ) and weekends on [email or web address] ? ?Patient seen and examined with the above-signed Advanced Practice Provider and/or Housestaff. I personally reviewed laboratory data, imaging studies and relevant notes. I independently examined the patient and formulated the important aspects of the plan. I have edited the note to reflect any of my changes or salient points. I have personally discussed the plan with the patient and/or family. ? ?Remains on broad spectrum abx. Feeling better. No fevers or chills. No CBC today. Walking unit. Remains in NSR but having more PVCs ? ?General:  Sitting in chair  No resp difficulty ?HEENT: normal ?Neck: supple. no JVD. Carotids 2+ bilat; no bruits. No lymphadenopathy or thryomegaly appreciated. ?Cor: PMI nondisplaced. Regular rate & rhythm. No rubs, gallops or murmurs. ?Lungs: clear ?Abdomen: soft, nontender,  nondistended. No hepatosplenomegaly. No bruits or masses. Good bowel sounds. ?Extremities: no cyanosis, clubbing, rash, edema ?Neuro: alert & orientedx3, cranial nerves grossly intact. moves all 4 extremities w/o

## 2022-02-17 NOTE — Progress Notes (Signed)
11 Days Post-Op Procedure(s) (LRB): ?CORONARY ARTERY BYPASS GRAFTING X4, Using Left and Right Greater Saphenous Vein Harvested Endoscopically (N/A) ?MITRAL VALVE (MV) REPLACEMENT Using 25MM Mitris Valve (N/A) ?TRANSESOPHAGEAL ECHOCARDIOGRAM (TEE) (N/A) ?Subjective: ?No complaints, slept well. ?Ambulated this am around unit without [place]. ? ?Objective: ?Vital signs in last 24 hours: ?Temp:  [98 ?F (36.7 ?C)-98.7 ?F (37.1 ?C)] 98.1 ?F (36.7 ?C) (04/18 0400) ?Pulse Rate:  [87-101] 87 (04/18 0600) ?Cardiac Rhythm: Normal sinus rhythm (04/18 0600) ?Resp:  [11-31] 15 (04/18 0600) ?BP: (94-116)/(46-89) 115/62 (04/18 0600) ?SpO2:  [90 %-100 %] 94 % (04/18 0600) ? ?Hemodynamic parameters for last 24 hours: ?  ? ?Intake/Output from previous day: ?04/17 0701 - 04/18 0700 ?In: 1477.3 [P.O.:1194; I.V.:6; IV Piggyback:277.3] ?Out: 1000 [Urine:1000] ?Intake/Output this shift: ?No intake/output data recorded. ? ?General appearance: alert and cooperative ?Neurologic: intact ?Heart: regular rate and rhythm, S1, S2 normal, no murmur ?Lungs: clear to auscultation bilaterally ?Extremities: no edema ?Wound: incisions healing well ? ?Lab Results: ?Recent Labs  ?  02/15/22 ?0400 02/16/22 ?0053  ?WBC 27.2* 29.1*  ?HGB 9.9* 9.1*  ?HCT 31.0* 28.5*  ?PLT 936* 813*  ? ?BMET:  ?Recent Labs  ?  02/15/22 ?0400 02/16/22 ?0053  ?NA 134* 135  ?K 3.5 4.2  ?CL 86* 93*  ?CO2 38* 34*  ?GLUCOSE 117* 123*  ?BUN 21 33*  ?CREATININE 1.09* 1.11*  ?CALCIUM 8.8* 8.7*  ?  ?PT/INR:  ?Recent Labs  ?  02/17/22 ?1610  ?LABPROT 23.0*  ?INR 2.1*  ? ?ABG ?   ?Component Value Date/Time  ? PHART 7.557 (H) 02/10/2022 9604  ? HCO3 38.0 (H) 02/10/2022 5409  ? TCO2 39 (H) 02/10/2022 8119  ? ACIDBASEDEF 3.0 (H) 02/07/2022 1010  ? O2SAT 56.9 02/15/2022 0840  ? ?CBG (last 3)  ?Recent Labs  ?  02/16/22 ?1603 02/17/22 ?1478 02/17/22 ?2956  ?GLUCAP 151* 161* 100*  ? ? ?Assessment/Plan: ?S/P Procedure(s) (LRB): ?CORONARY ARTERY BYPASS GRAFTING X4, Using Left and Right Greater  Saphenous Vein Harvested Endoscopically (N/A) ?MITRAL VALVE (MV) REPLACEMENT Using 25MM Mitris Valve (N/A) ?TRANSESOPHAGEAL ECHOCARDIOGRAM (TEE) (N/A) ? ?POD 11 ? ?Hemodynamically stable on midodrine 2.5 tid. Maintaining sinus on amio. ? ?Wt stable below preop. No edema. ? ?On empiric vanc and Zosyn for possible pneumonia. Will complete 5 day course. She has leukocytosis of unclear etiology but presumed to be pneumonia. No fever or other signs of sepsis. Cultures negative. Could be inflammatory. On colchicine for possible pericarditis. ? ?Glucose under good control and preop Hgb A1c 5.8. Will DC Levemir and continue SSI today.  ? ?INR therapeutic on 5 mg Coumadin. Continue this dose for now. Goal is 2-3. ? ?Transfer to 4E. Continue IS, ambulation. ? ?Home once she completes IV antibiotic course. ? ? LOS: 14 days  ? ? ?[name] ?02/17/2022 ? ? ?

## 2022-02-17 NOTE — Progress Notes (Signed)
CARDIAC REHAB PHASE I  ? ?Offered to walk with pt. Pt states recent ambulation around the unit without as assistive device. Pt and family deny DME needs for home. Provided support and encouragement. Will continue to follow. ? ?[id number] ?Rufina Falco, RN BSN ?02/17/2022 ?1:22 PM ? ?

## 2022-02-17 NOTE — TOC Benefit Eligibility Note (Signed)
Patient Advocate Encounter ? ?Insurance verification completed.   ? ?The patient is currently admitted and upon discharge could be taking colchicine 0.6 mg tablets. ? ?The current 30 day co-pay is, $7.00.  ? ?The patient is insured through [employer] Part D  ? ? ? ?Lyndel Safe, CPhT ?Pharmacy Patient Advocate Specialist ?[facility] Patient Advocate Team ?Direct Number: [phone]  Fax: [phone] ? ? ? ? ? ?  ?

## 2022-02-17 NOTE — Progress Notes (Signed)
Pt received from [place]. VSS. Telemetry applied. Pt and family oriented to room and unit. Call light in reach. ? ?Clyde Canterbury, RN ? ?

## 2022-02-18 LAB — BASIC METABOLIC PANEL
Anion gap: 10 (ref 5–15)
BUN: 21 mg/dL (ref 8–23)
CO2: 29 mmol/L (ref 22–32)
Calcium: 8.1 mg/dL — ABNORMAL LOW (ref 8.9–10.3)
Chloride: 96 mmol/L — ABNORMAL LOW (ref 98–111)
Creatinine, Ser: 0.83 mg/dL (ref 0.44–1.00)
GFR, Estimated: >60 mL/min (ref >60)
Glucose, Bld: 106 mg/dL — ABNORMAL HIGH (ref 70–99)
Potassium: 3.4 mmol/L — ABNORMAL LOW (ref 3.5–5.1)
Sodium: 135 mmol/L (ref 135–145)

## 2022-02-18 LAB — PROTIME-INR
INR: 1.9 — ABNORMAL HIGH (ref 0.8–1.2)
Prothrombin Time: 21.2 seconds — ABNORMAL HIGH (ref 11.4–15.2)

## 2022-02-18 LAB — CBC
HCT: 27 % — ABNORMAL LOW (ref 36.0–46.0)
Hemoglobin: 8.5 g/dL — ABNORMAL LOW (ref 12.0–15.0)
MCH: 27.8 pg (ref 26.0–34.0)
MCHC: 31.5 g/dL (ref 30.0–36.0)
MCV: 88.2 fL (ref 80.0–100.0)
Platelets: 777 10*3/uL — ABNORMAL HIGH (ref 150–400)
RBC: 3.06 MIL/uL — ABNORMAL LOW (ref 3.87–5.11)
RDW: 15.2 % (ref 11.5–15.5)
WBC: 21.9 10*3/uL — ABNORMAL HIGH (ref 4.0–10.5)
nRBC: 0 % (ref 0.0–0.2)

## 2022-02-18 LAB — GLUCOSE, CAPILLARY
Glucose-Capillary: 113 mg/dL — ABNORMAL HIGH (ref 70–99)
Glucose-Capillary: 132 mg/dL — ABNORMAL HIGH (ref 70–99)
Glucose-Capillary: 133 mg/dL — ABNORMAL HIGH (ref 70–99)
Glucose-Capillary: 114 mg/dL — ABNORMAL HIGH (ref 70–99)

## 2022-02-18 MED ORDER — SPIRONOLACTONE 25 MG PO TABS
25.0000 mg | ORAL_TABLET | Freq: Every day | ORAL | Status: DC
  Administered 2022-02-19: 25 mg via ORAL
  Filled 2022-02-18: qty 1

## 2022-02-18 NOTE — Progress Notes (Signed)
CARDIAC REHAB PHASE I  ? ?PRE:  Rate/Rhythm: 93 SR ? ?BP:  Sitting: 119/56     ? ?SaO2: 90 RA ? ?MODE:  Ambulation: 400 ft  ? ?POST:  Rate/Rhythm: 107 ST ? ?BP:  Sitting: 128/60 ? ?  SaO2: 99 RA ? ? ?Pt ambulated 422f in hallway standby assist with fast gait. Pt took two short standing rest breaks c/o SOB, denies CP or dizziness. Pt returned to bed. Encouraged continued ambulation and IS use. Will continue to follow. ? ?[id number]?TRufina Falco RN BSN ?02/18/2022 ?10:53 AM ? ?

## 2022-02-18 NOTE — Progress Notes (Addendum)
?  ? ? Advanced Heart Failure Rounding Note ? ?PCP-Cardiologist: None  ? ?Subjective:   ? ?76 y/o with severe CAD/NSTEMI and iCM EF 30-35%. Developed refractory angina and shock with respiratory failure. Intubated and IABP placed.  ?4/7 CABG + MVR  ?4/8 Extubated, IABP removed  ?04/11 AFL >>converted shortly after starting amio gtt ?On empiric abx for possible PNA. Colchicine started for possible pericarditis. ? ?Remains on Zosyn. WBC improving, 29>>21K. Afebrile. BCx NGTD ? ?Currently off tele, getting bedside bath. RRR on exam.  ? ?K 3.4 ? ?Feeling better. Denies dyspnea. No cough. Has been ambulating w/o much difficulty.  ? ?Limited echo 04/12: ?EF 35-40%, RMA, RV okay, mean gradient 7 across mitral valve prosthesis, large density in LA cavity that likely represents artifact  ? ? ?Objective:   ?Weight Range: ?57.8 kg ?Body mass index is 21.89 kg/m?.  ? ?Vital Signs:   ?Temp:  [98 ?F (36.7 ?C)-99 ?F (37.2 ?C)] 98 ?F (36.7 ?C) (04/19 0739) ?Pulse Rate:  [90-100] 93 (04/19 0739) ?Resp:  [15-22] 15 (04/19 0739) ?BP: (96-118)/(40-70) 111/63 (04/19 0739) ?SpO2:  [88 %-99 %] 99 % (04/19 0739) ?Weight:  [57.8 kg] 57.8 kg (04/19 0426) ?Last BM Date : 02/17/22 ? ?Weight change: ?Filed Weights  ? 02/16/22 0500 02/17/22 0500 02/18/22 0426  ?Weight: 57.8 kg 58 kg 57.8 kg  ? ? ?Intake/Output:  ? ?Intake/Output Summary (Last 24 hours) at 02/18/2022 0831 ?Last data filed at 02/18/2022 0736 ?Gross per 24 hour  ?Intake 509.69 ml  ?Output 301 ml  ?Net 208.69 ml  ?  ? ? ?Physical Exam  ? ?General:  Well appearing, sitting up in chair. No respiratory difficulty ?HEENT: normal ?Neck: supple. no JVD. Carotids 2+ bilat; no bruits. No lymphadenopathy or thyromegaly appreciated. Sternotomy site stable  ?Cor: PMI nondisplaced. Regular rate & rhythm. No rubs, gallops or murmurs. ?Lungs: clear ?Abdomen: soft, nontender, nondistended. No hepatosplenomegaly. No bruits or masses. Good bowel sounds. ?Extremities: no cyanosis, clubbing, rash,  edema ?Neuro: alert & oriented x 3, cranial nerves grossly intact. moves all 4 extremities w/o difficulty. Affect pleasant. ? ?Telemetry  ? ?Currently off tele (getting bath)  ? ?Labs  ?  ?CBC ?Recent Labs  ?  02/16/22 ?0053 02/18/22 ?0156  ?WBC 29.1* 21.9*  ?HGB 9.1* 8.5*  ?HCT 28.5* 27.0*  ?MCV 88.2 88.2  ?PLT 813* 777*  ? ?Basic Metabolic Panel ?Recent Labs  ?  02/16/22 ?0053 02/17/22 ?9244 02/18/22 ?0156  ?NA 135 135 135  ?K 4.2 3.5 3.4*  ?CL 93* 92* 96*  ?CO2 34* 25 29  ?GLUCOSE 123* 120* 106*  ?BUN 33* 33* 21  ?CREATININE 1.11* 0.94 0.83  ?CALCIUM 8.7* 8.3* 8.1*  ?MG 2.3 2.3  --   ? ?Liver Function Tests ?No results for input(s): AST, ALT, ALKPHOS, BILITOT, PROT, ALBUMIN in the last 72 hours. ? ?No results for input(s): LIPASE, AMYLASE in the last 72 hours. ?Cardiac Enzymes ?No results for input(s): CKTOTAL, CKMB, CKMBINDEX, TROPONINI in the last 72 hours. ? ?BNP: ?BNP (last 3 results) ?Recent Labs  ?  02/02/22 ?2321  ?BNP 366.4*  ? ? ?ProBNP (last 3 results) ?No results for input(s): PROBNP in the last 8760 hours. ? ? ?D-Dimer ?No results for input(s): DDIMER in the last 72 hours. ?Hemoglobin A1C ?No results for input(s): HGBA1C in the last 72 hours. ? ?Fasting Lipid Panel ?No results for input(s): CHOL, HDL, LDLCALC, TRIG, CHOLHDL, LDLDIRECT in the last 72 hours. ? ?Thyroid Function Tests ?No results for input(s): TSH, T4TOTAL,  T3FREE, THYROIDAB in the last 72 hours. ? ?Invalid input(s): FREET3 ? ? ?Other results: ? ? ?Imaging  ? ? ?No results found. ? ? ?Medications:   ? ? ?Scheduled Medications: ? amiodarone  200 mg Oral BID  ? aspirin EC  81 mg Oral Daily  ? colchicine  0.6 mg Oral BID  ? feeding supplement  237 mL Oral BID BM  ? fluticasone  1 spray Each Nare Daily  ? levothyroxine  25 mcg Oral Q0600  ? mouth rinse  15 mL Mouth Rinse BID  ? melatonin  3 mg Oral QHS  ? midodrine  2.5 mg Oral TID WC  ? multivitamin with minerals  1 tablet Oral Daily  ? pantoprazole  40 mg Oral Daily  ? rosuvastatin  5 mg  Oral Daily  ? sodium chloride flush  10-40 mL Intracatheter Q12H  ? sodium chloride flush  3 mL Intravenous Q12H  ? spironolactone  12.5 mg Oral Daily  ? warfarin  5 mg Oral q1600  ? Warfarin - Physician Dosing Inpatient   Does not apply q1600  ? ? ?Infusions: ? sodium chloride    ? piperacillin-tazobactam (ZOSYN)  IV 3.375 g (02/18/22 5277)  ? ? ?PRN Medications: ?sodium chloride, bismuth subsalicylate, Gerhardt's butt cream, prochlorperazine, promethazine, simethicone, sodium chloride flush, sodium chloride flush, traMADol ? ? ? ?Patient Profile  ? ?76 y/o with severe CAD/NSTEMI and iCM EF 30-35%. Developed refractory angina and shock yesterday with respiratory failure. Intubated and IABP placed. Taken to OR 4/7 for CABG + MVR  ? ?Assessment/Plan  ? ?1. NSTEMI---> Cardiogenic Shock ?- HS Trop 824>2353> 6513 ?- Cath with 3V disease, elevated wedge, and reduced cardiac output. IABP placed in lab with Norepi/Milrinone.  ?- 4/7 s/p CABG + MVR ?- On ASA 325. Would typically give plavix post NSTEMI, but also on warfarin with recent MVR. ?- No s/s angina. Continue statin ?- Cardiac rehab  ?  ?2. Acute Hypoxemic Respiratory Failure ?- Extubated 4/8. ?- stable on RA  ? ?3. Acute HFrEF, ICM  -> cardiogenic shock ?- Echo EF 30-35% RV ok. Severe MR. EF improved on post CABG TEE. ?- Limited echo 04/12: EF 35-40%, RV okay, large density in LA cavity likely artifact ?- Off Milrinone, NE weaned off 04/13. IABP out ?- On midodrine 2.5 mg TID for BP support. SBPs 100-110 ?- Volume looks good on exam. Off diuretics. ?- Titrate GDMT as tolerated.  ?- Increase Spiro to 25 mg daily  ? ?4. Severe MR  ?- S/p MV replacement with 25 mm Mitris Resilia pericardial valve ?- Warfarin started per TCTS (will be on for 3 months) ?- INR 1.9 ? ?5. PVCs ?- improved, rare PVCs on tele  ?- On amio 200 mg BID for AFL. Continue. ?- Keep K > 4.0 and Mg > 2.0  ?  ?6. Hypothyroidism  ?-On levothyroxine.  ?  ?7. AKI due to ATN/shock ?- resolved ?- monitor  w/ diuretics ? ?8. Acute blood loss anemia ?- transfuse as needed to keep hgb >= 8.0 ?- Hgb stable at 8.5  ? ?9. Leukocytosis ?- WBC 17-> 27 -> 29->21k ?- UA ok ?- PCT < 0.10, Lactic acid okay, BCx NG so far.  ?- PICC line out ?- Vanc + zosyn for possible PNA, will complete 5 day course ?- Now on colchicine for possible pericarditis ? ?10. B/l Pleural Effusions ?- Improved with diuresis ?- lung u/s with IR 4/14 no significant effusion to tap ? ?11. Atrial Flutter ?- Currently  SR 90s with 1st degree AVB  ?- Continue amiodarone 200 mg twice daily ?- on warfarin, INR 1.9 ? ?12. Hypokalemia ?- K 3.4 ?- supp w/ KCl ?- increase Spiro to 25 mg daily  ? ? ?Length of Stay: 15 ? ?Lyda Jester, PA-C  ?02/18/2022, 8:31 AM ? ?Advanced Heart Failure Team ?Pager [phone] (M-F; 7a - 5p)  ?Please contact [facility] for night-coverage after hours (5p -7a ) and weekends on [email or web address] ? ?Patient seen and examined with the above-signed Advanced Practice Provider and/or Housestaff. I personally reviewed laboratory data, imaging studies and relevant notes. I independently examined the patient and formulated the important aspects of the plan. I have edited the note to reflect any of my changes or salient points. I have personally discussed the plan with the patient and/or family. ? ?Feeling better. Denies CP or SOB. WBC falling on abx. Rhythm stable ? ?General:  Sitting up in chair No resp difficulty ?HEENT: normal ?Neck: supple. no JVD. Carotids 2+ bilat; no bruits. No lymphadenopathy or thryomegaly appreciated. ?Cor: PMI nondisplaced. Regular rate & rhythm. No rubs, gallops or murmurs. ?Lungs: clear ?Abdomen: soft, nontender, nondistended. No hepatosplenomegaly. No bruits or masses. Good bowel sounds. ?Extremities: no cyanosis, clubbing, rash, edema ?Neuro: alert & orientedx3, cranial nerves grossly intact. moves all 4 extremities w/o difficulty. Affect pleasant ? ?Looks much better, Will continue abx and pulmonary toilet. INR  1.9 Supp K.  ? ?Glori Bickers, MD  ?11:44 AM ? ? ? ?

## 2022-02-18 NOTE — Progress Notes (Addendum)
? ?   ?  [address] ?      [place] [address] ?            [phone]   ? ?  ? ? ?12 Days Post-Op Procedure(s) (LRB): ?CORONARY ARTERY BYPASS GRAFTING X4, Using Left and Right Greater Saphenous Vein Harvested Endoscopically (N/A) ?MITRAL VALVE (MV) REPLACEMENT Using 25MM Mitris Valve (N/A) ?TRANSESOPHAGEAL ECHOCARDIOGRAM (TEE) (N/A) ? ?Subjective: ?Patient in good spirits this am. She walked several times yesterday. She has no specific complaint this am. ? ?Objective: ?Vital signs in last 24 hours: ?Temp:  [98 ?F (36.7 ?C)-99 ?F (37.2 ?C)] 98 ?F (36.7 ?C) (04/19 0739) ?Pulse Rate:  [90-100] 93 (04/19 0739) ?Cardiac Rhythm: Heart block (04/18 1850) ?Resp:  [15-22] 15 (04/19 0739) ?BP: (96-118)/(40-70) 111/63 (04/19 0739) ?SpO2:  [88 %-99 %] 99 % (04/19 0739) ?Weight:  [57.8 kg] 57.8 kg (04/19 0426) ? ? ?Current Weight  ?02/18/22 57.8 kg  ?  ? ?Intake/Output from previous day: ?04/18 0701 - 04/19 0700 ?In: 350.3 [P.O.:250; IV Piggyback:100.3] ?Out: 301 [Urine:300; Stool:1] ? ? ?Physical Exam: ? ?Cardiovascular: RRR, no murmur ?Pulmonary: Clear to auscultation bilaterally ?Abdomen: Soft, non tender, bowel sounds present. ?Extremities: No lower extremity edema. ?Wounds: Clean and dry.  No erythema or signs of infection. ? ?Lab Results: ?CBC: ?Recent Labs  ?  02/16/22 ?0053 02/18/22 ?0156  ?WBC 29.1* 21.9*  ?HGB 9.1* 8.5*  ?HCT 28.5* 27.0*  ?PLT 813* 777*  ? ?BMET:  ?Recent Labs  ?  02/17/22 ?3888 02/18/22 ?0156  ?NA 135 135  ?K 3.5 3.4*  ?CL 92* 96*  ?CO2 25 29  ?GLUCOSE 120* 106*  ?BUN 33* 21  ?CREATININE 0.94 0.83  ?CALCIUM 8.3* 8.1*  ?  ?PT/INR:  ?Lab Results  ?Component Value Date  ? INR 1.9 (H) 02/18/2022  ? INR 2.1 (H) 02/17/2022  ? INR 1.9 (H) 02/16/2022  ? ?ABG:  ?INR: ?Will add last result for INR, ABG once components are confirmed ?Will add last 4 CBG results once components are confirmed ? ?Assessment/Plan: ? ?1. CV - S/p NSTEMI, cardiogenic shock. Previous a flutter. SR, first degree  heart block. INR slightly decreased to 1.9. On Amiodarone 200 mg bid, Midodrine 2.5 mg tid, and Coumadin. On Colchicine for possible pericarditis ?2.  Pulmonary - On room air. Encourage incentive spirometer. ?3.Acute HFrEF, ICM- on Spironolactone 12.5 mg daily ?4.  Expected post op acute blood loss anemia - H and H this am decreased to 8.5 and 27 ?5. CBGs 164/104/113. Pre op HGA1C 5.7. She likely has pre diabetes. She will need further surveillance by medical doctor after discharge. ?6. Thrombocytosis-platelets decreased to 777,000 ?7. Supplement potassium ?8. History of hypothyroidism-on Levothyroxine 25 mcg daily ?9. ID-on Zosyn for possible PNA (Vanco stopped).  ?10. Appears Zosyn will be finished tonight. Possible discharge in am ? ?Donielle M ZimmermanPA-C ?7:56 AM ?  ? Chart reviewed, patient examined, agree with above. ?She feels well and is walking independently. WBC is trending down. Unclear if this is due to infection or inflammation. She will complete 5 day course of broad spectrum antibiotics tonight. Plan home in am if no changes. ?

## 2022-02-19 ENCOUNTER — Other Ambulatory Visit: Payer: Self-pay | Admitting: Family Medicine

## 2022-02-19 DIAGNOSIS — D72829 Elevated white blood cell count, unspecified: Secondary | ICD-10-CM | POA: Diagnosis not present

## 2022-02-19 DIAGNOSIS — Z951 Presence of aortocoronary bypass graft: Secondary | ICD-10-CM | POA: Diagnosis not present

## 2022-02-19 DIAGNOSIS — R531 Weakness: Secondary | ICD-10-CM | POA: Diagnosis not present

## 2022-02-19 DIAGNOSIS — I4892 Unspecified atrial flutter: Secondary | ICD-10-CM | POA: Diagnosis not present

## 2022-02-19 LAB — BASIC METABOLIC PANEL
Anion gap: 7 (ref 5–15)
BUN: 17 mg/dL (ref 8–23)
CO2: 31 mmol/L (ref 22–32)
Calcium: 8.1 mg/dL — ABNORMAL LOW (ref 8.9–10.3)
Chloride: 99 mmol/L (ref 98–111)
Creatinine, Ser: 0.95 mg/dL (ref 0.44–1.00)
GFR, Estimated: >60 mL/min (ref >60)
Glucose, Bld: 123 mg/dL — ABNORMAL HIGH (ref 70–99)
Potassium: 3.4 mmol/L — ABNORMAL LOW (ref 3.5–5.1)
Sodium: 137 mmol/L (ref 135–145)

## 2022-02-19 LAB — CBC WITH DIFFERENTIAL/PLATELET
Abs Immature Granulocytes: 0.15 10*3/uL — ABNORMAL HIGH (ref 0.00–0.07)
Basophils Absolute: 0 10*3/uL (ref 0.0–0.1)
Basophils Relative: 0 %
Eosinophils Absolute: 0.4 10*3/uL (ref 0.0–0.5)
Eosinophils Relative: 2 %
HCT: 27.8 % — ABNORMAL LOW (ref 36.0–46.0)
Hemoglobin: 8.8 g/dL — ABNORMAL LOW (ref 12.0–15.0)
Immature Granulocytes: 1 %
Lymphocytes Relative: 12 %
Lymphs Abs: 2.2 10*3/uL (ref 0.7–4.0)
MCH: 27.9 pg (ref 26.0–34.0)
MCHC: 31.7 g/dL (ref 30.0–36.0)
MCV: 88.3 fL (ref 80.0–100.0)
Monocytes Absolute: 1.5 10*3/uL — ABNORMAL HIGH (ref 0.1–1.0)
Monocytes Relative: 8 %
Neutro Abs: 14 10*3/uL — ABNORMAL HIGH (ref 1.7–7.7)
Neutrophils Relative %: 77 %
Platelets: 839 10*3/uL — ABNORMAL HIGH (ref 150–400)
RBC: 3.15 MIL/uL — ABNORMAL LOW (ref 3.87–5.11)
RDW: 15.4 % (ref 11.5–15.5)
WBC: 18.3 10*3/uL — ABNORMAL HIGH (ref 4.0–10.5)
nRBC: 0 % (ref 0.0–0.2)

## 2022-02-19 LAB — PROTIME-INR
INR: 2.4 — ABNORMAL HIGH (ref 0.8–1.2)
Prothrombin Time: 26.2 seconds — ABNORMAL HIGH (ref 11.4–15.2)

## 2022-02-19 LAB — GLUCOSE, CAPILLARY: Glucose-Capillary: 109 mg/dL — ABNORMAL HIGH (ref 70–99)

## 2022-02-19 MED ORDER — SPIRONOLACTONE 25 MG PO TABS: 25.0000 mg | ORAL_TABLET | Freq: Every day | ORAL | 1 refills | Status: DC

## 2022-02-19 MED ORDER — TRIMETHOPRIM 100 MG PO TABS: 100.0000 mg | ORAL_TABLET | Freq: Every day | ORAL | Status: DC

## 2022-02-19 MED ORDER — DAPAGLIFLOZIN PROPANEDIOL 10 MG PO TABS
10.0000 mg | ORAL_TABLET | Freq: Every day | ORAL | Status: DC
Start: 2022-02-19 — End: 2022-02-19
  Administered 2022-02-19: 10 mg via ORAL
  Filled 2022-02-19: qty 1

## 2022-02-19 MED ORDER — ASPIRIN 81 MG PO TBEC: 81.0000 mg | DELAYED_RELEASE_TABLET | Freq: Every day | ORAL | 11 refills | Status: AC

## 2022-02-19 MED ORDER — AMIODARONE HCL 200 MG PO TABS: 200.0000 mg | ORAL_TABLET | Freq: Two times a day (BID) | ORAL | 1 refills | Status: DC

## 2022-02-19 MED ORDER — DAPAGLIFLOZIN PROPANEDIOL 10 MG PO TABS: 10.0000 mg | ORAL_TABLET | Freq: Every day | ORAL | 1 refills | Status: DC

## 2022-02-19 MED ORDER — WARFARIN SODIUM 3 MG PO TABS: 3.0000 mg | ORAL_TABLET | Freq: Every day | ORAL | 1 refills | Status: DC

## 2022-02-19 MED ORDER — MIDODRINE HCL 2.5 MG PO TABS: 2.5000 mg | ORAL_TABLET | Freq: Two times a day (BID) | ORAL | 1 refills | Status: DC

## 2022-02-19 MED ORDER — ROSUVASTATIN CALCIUM 5 MG PO TABS: 5.0000 mg | ORAL_TABLET | Freq: Every day | ORAL | 1 refills | Status: DC

## 2022-02-19 MED ORDER — BETAMETHASONE DIPROPIONATE 0.05 % EX CREA: 1.0000 "application " | TOPICAL_CREAM | Freq: Two times a day (BID) | CUTANEOUS | Status: DC

## 2022-02-19 MED ORDER — TRAMADOL HCL 50 MG PO TABS: 50.0000 mg | ORAL_TABLET | Freq: Four times a day (QID) | ORAL | 0 refills | Status: AC | PRN

## 2022-02-19 MED ORDER — ADULT MULTIVITAMIN W/MINERALS CH: 1.0000 | ORAL_TABLET | Freq: Every day | ORAL | Status: AC

## 2022-02-19 MED ORDER — AMOXICILLIN-POT CLAVULANATE 875-125 MG PO TABS: 1.0000 | ORAL_TABLET | Freq: Two times a day (BID) | ORAL | 0 refills | Status: DC

## 2022-02-19 NOTE — Progress Notes (Addendum)
? ?   ?  [address] ?      [place] [address] ?            [phone]   ? ?  ? ? ?13 Days Post-Op Procedure(s) (LRB): ?CORONARY ARTERY BYPASS GRAFTING X4, Using Left and Right Greater Saphenous Vein Harvested Endoscopically (N/A) ?MITRAL VALVE (MV) REPLACEMENT Using 25MM Mitris Valve (N/A) ?TRANSESOPHAGEAL ECHOCARDIOGRAM (TEE) (N/A) ? ?Subjective: ?Patient starting to have loose stools. I assisted her to the bathroom this am. ? ?Objective: ?Vital signs in last 24 hours: ?Temp:  [98 ?F (36.7 ?C)-98.8 ?F (37.1 ?C)] 98.5 ?F (36.9 ?C) (04/20 0416) ?Pulse Rate:  [89-96] 89 (04/20 0416) ?Cardiac Rhythm: Heart block (04/19 1900) ?Resp:  [14-19] 19 (04/20 0416) ?BP: (103-122)/(51-64) 109/53 (04/20 0416) ?SpO2:  [96 %-99 %] 96 % (04/20 0416) ?Weight:  [57.5 kg] 57.5 kg (04/20 9935) ? ? ?Current Weight  ?02/19/22 57.5 kg  ?  ? ?Intake/Output from previous day: ?04/19 0701 - 04/20 0700 ?In: 275.3 [P.O.:180; IV Piggyback:95.3] ?Out: -  ? ? ?Physical Exam: ? ?Cardiovascular: RRR, no murmur ?Pulmonary: Clear to auscultation bilaterally ?Abdomen: Soft, non tender, bowel sounds present. ?Extremities: No lower extremity edema. ?Wounds: Clean and dry.  No erythema or signs of infection. ? ?Lab Results: ?CBC: ?Recent Labs  ?  02/18/22 ?0156 02/19/22 ?0120  ?WBC 21.9* 18.3*  ?HGB 8.5* 8.8*  ?HCT 27.0* 27.8*  ?PLT 777* 839*  ? ? ?BMET:  ?Recent Labs  ?  02/17/22 ?7017 02/18/22 ?0156  ?NA 135 135  ?K 3.5 3.4*  ?CL 92* 96*  ?CO2 25 29  ?GLUCOSE 120* 106*  ?BUN 33* 21  ?CREATININE 0.94 0.83  ?CALCIUM 8.3* 8.1*  ? ?  ?PT/INR:  ?Lab Results  ?Component Value Date  ? INR 2.4 (H) 02/19/2022  ? INR 1.9 (H) 02/18/2022  ? INR 2.1 (H) 02/17/2022  ? ?ABG:  ?INR: ?Will add last result for INR, ABG once components are confirmed ?Will add last 4 CBG results once components are confirmed ? ?Assessment/Plan: ? ?1. CV - S/p NSTEMI, cardiogenic shock. Previous a flutter. SR, first degree heart block this am. INR slightly increased to  1.9. to 2.41 this am. On Amiodarone 200 mg bid, Midodrine 2.5 mg tid, and Coumadin 5 mg daily. As discussed with Dr. Cyndia Bent, will discharge on 3 mg of Coumadin and decrease Midodrine to 2.5 mg bid. . On Colchicine for possible pericarditis ?2.  Pulmonary - On room air. Encourage incentive spirometer. ?3.Acute HFrEF, ICM- on Spironolactone 12.5 mg daily ?4.  Expected post op acute blood loss anemia - H and H this am stable at 8.8 and 27.8 ?6. Thrombocytosis-platelets decreased to 839,,000 ?7. History of hypothyroidism-on Levothyroxine 25 mcg daily ?8. ID-WBC decreased to 18,300. on Zosyn for possible PNA (Vanco stopped). Blood culture no growth for 3 days, UC no growth, and no sign of wound infection. As discussed with Dr. Cyndia Bent, will give 5 days of oral Augmentin ?9. GI-diarrhea. Likely due to Colchicine so will stop ?10. Hope to discharge after seen by [facility]. Will need follow up appointment and PT/INR appointment for Monday 04/24 by [facility] ? ?Donielle M ZimmermanPA-C ?7:02 AM ?  ? Chart reviewed, patient examined, agree with above. ?She feels well except for diarrhea. Stop colchicine. ?Plan home today on meds as noted above.  ?

## 2022-02-19 NOTE — TOC CM/SW Note (Addendum)
.. ? ?  ?  Durable Medical Equipment  ?(From admission, onward)  ?  ? ? ?  ? ?  Start     Ordered  ? 02/17/22 1613  For home use only DME 3 n 1  Once       ? 02/17/22 1612  ? 02/16/22 1227  For home use only DME [place] rolling  Once       ?Comments: Will pay for out of pocket  ?Question Answer Comment  ?[place]: With 5 Inch Wheels   ?Patient needs a [place] to treat with the following condition Heart failure ([place])   ?Patient needs a [place] to treat with the following condition S/P CABG x 4   ?  ? 02/16/22 1227  ? ?  ?  ? ?  ?  Patient recently had CABG, and HF, on diuretics. Pt is high risk falls due to current medically conditions. Jonnie Finner RN3 CCM, Heart Failure TOC CM [phone]  ? ? ?CABG X4 ICD-10 code I25. 810 ?Heart Failure ICD-10 code I50.9 ? ?

## 2022-02-19 NOTE — Care Management Important Message (Signed)
Important Message ? ?Patient Details  ?Name: Tonya Hamilton ?MRN: [id number] ?Date of Birth: [birth year] ? ? ?Medicare Important Message Given:  Yes ? ? ? ? ?[name] ?02/19/2022, 9:14 AM ?

## 2022-02-19 NOTE — Progress Notes (Addendum)
Physical Therapy Treatment ?Patient Details ?Name: Tonya Hamilton ?MRN: [id number] ?DOB: Mar 03, 1946 ?Today's Date: 02/19/2022 ? ? ?History of Present Illness 76 y.o. F who presents 4/3 with dyspnea. Echo with reduced EF and regional wall motion abnormalities with severe MR, cardiology consulted and taken to cath lab which revealed severe LM and 3V disease.  IABP placed and intubated, in cardiogenic shock. S/p CABG and MVR 4/7. Significant PMH: diverticulitis, OA, HTN, HLD, osteopenia, anxiety. ? ?  ?PT Comments  ? ? Pt received in supine, agreeable to therapy session with emphasis on stairs training. PT is making good progress toward her physical therapy goals, demonstrating an increase in ambulation distance and stairs. Practiced sit to stands and basic lower extremity strengthening exercises with maintaining sternal precautions. Pt returned to bed due to feeling tired, more due to lack of sleep, not due to increased muscle fatigue. Pt would benefit from increased distance while gait training and stairs with continued emphasis and reinforcement of sternal precautions during functional activities. ?  ?Recommendations for follow up therapy are one component of a multi-disciplinary discharge planning process, led by the attending physician.  Recommendations may be updated based on patient status, additional functional criteria and insurance authorization. ? ?Follow Up Recommendations ?   ?  ?  ?Assistance Recommended at Discharge Frequent or constant Supervision/Assistance  ?Patient can return home with the following A little help with walking and/or transfers;A little help with bathing/dressing/bathroom;Assist for transportation;Help with stairs or ramp for entrance;Assistance with cooking/housework ?  ?Equipment Recommendations ? Rolling Frayre (2 wheels)  ?  ?Recommendations for Other Services Rehab consult ? ? ?  ?Precautions / Restrictions Precautions ?Precautions: Sternal;Fall ?Precaution Comments: Watch BP,  O2 ?Restrictions ?Weight Bearing Restrictions: Yes ?RUE Weight Bearing: Non weight bearing ?LUE Weight Bearing: Non weight bearing ?Other Position/Activity Restrictions: sternal precautions  ?  ? ?Mobility ? Bed Mobility ?Overal bed mobility: Modified Independent ?Bed Mobility: Supine to Sit ?  ?  ?Supine to sit: Modified independent ?  ?  ?General bed mobility comments: No physical assist required ?  ? ?Transfers ?Overall transfer level: need assistance ?Equipment used: None ?Transfers: Sit to/from Stand ?Sit to Stand: Min guard ?  ?  ?  ?  ?  ?General transfer comment: cues for hand placement, does not need to rock forward any longer and can power up on first bout, initial min guard to steady ?  ? ?Ambulation/Gait ?Ambulation/Gait assistance: Min guard, Supervision ?Gait Distance (Feet): 100 Feet ?Assistive device: None ?  ?Gait velocity: reduced ?  ?  ?General Gait Details: Pt with steady pace, ambulating 173f with no assistive device at a supervision-min guard assist level ? ? ?Stairs ?Stairs: Yes ?Stairs assistance: Min guard ?Stair Management: One rail Right, Alternating pattern, Forwards ?Number of Stairs: 4 ?General stair comments: Ascends and descends with R rail and reciprocal pattern, no LOB but unsteadiness noted, min guard for safety ? ? ? ? ?  ?Balance Overall balance assessment: Modified Independent ?Sitting-balance support: Feet supported ?Sitting balance-Leahy Scale: Good ?  ?  ?Standing balance support: No upper extremity supported, During functional activity ?Standing balance-Leahy Scale: Good ?Standing balance comment: Able to ambulate without UE support ?  ?  ?  ? ?  ?Cognition Arousal/Alertness: Awake/alert ?Behavior During Therapy: [facility]/performed ?Overall Cognitive Status: Within Functional Limits for tasks assessed ?  ?  ?  ?Current Attention Level: Sustained ?  ?Following Commands: Follows one step commands consistently ?  ?Awareness: Intellectual ?Problem Solving:  Requires verbal cues ?  General Comments: Requiring Min-Mod cues throughout for adherance to precautions during ADLs. At end of session, pt reports an RPE of 5/10 more due to lack of sleep rathe than due to muscle fatigue. ?  ?  ? ?  ?Exercises General Exercises - Lower Extremity ?Long Arc Quad: Both, 10 reps, Seated ?Heel Slides: AROM, Both, 10 reps, Seated ?Hip Flexion/Marching: AROM, Both, 10 reps, Seated ?Other Exercises ?Other Exercises: Serial sit to stands x 5 ? ?  ?\   ? ?Pertinent Vitals/Pain Pain Assessment ?Pain Assessment: No/denies pain ?Pain Score: 0-No pain  ? ? ?   ?   ? ?PT Goals (current goals can now be found in the care plan section) Acute Rehab PT Goals ?Patient Stated Goal: to get better and go home if able ?PT Goal Formulation: With patient/family ?Time For Goal Achievement: 02/24/22 ?Potential to Achieve Goals: Good ?Progress towards PT goals: Progressing toward goals ? ?  ?Frequency ? ? ? Min 3X/week ? ? ? ?  ?PT Plan Current plan remains appropriate  ? ? ?   ?AM-PAC PT "6 Clicks" Mobility   ?Outcome Measure ? Help needed turning from your back to your side while in a flat bed without using bedrails?: None ?Help needed moving from lying on your back to sitting on the side of a flat bed without using bedrails?: None ?Help needed moving to and from a bed to a chair (including a wheelchair)?: A Little ?Help needed standing up from a chair using your arms (e.g., wheelchair or bedside chair)?: A Little ?Help needed to walk in hospital room?: A Little ?Help needed climbing 3-5 steps with a railing? : A Little ?6 Click Score: 20 ? ?  ?End of Session Equipment Utilized During Treatment: Gait belt ? Activity Tolerance: Pt tolerated treatment well ?Patient left: with call bell/phone within reach; in bed; with bed alarm set ?Nurse Communication: Mobility Status ?  ?  ? ? ?Time: 0947-0962 ?PT Time Calculation (min) (ACUTE ONLY): 27 min ? ?Charges:   $Therapeutic Exercise ?$Gait Training         ? ?Wadie Lessen, SPTA ? ? ? ?Tiffany Mutch ?02/19/2022, 11:03 AM ? ?

## 2022-02-19 NOTE — Progress Notes (Signed)
CARDIAC REHAB PHASE I  ? ?D/c education completed with pt and spouse. Pt educated on importance of site care and monitoring incisions daily. Encouraged continued IS use, walks, and sternal precautions. Pt given in-the-tube sheet along with heart healthy diet. Reviewed restrictions and exercise guidelines. Will refer to CRP II [place]. ? ?[id number] ?Rufina Falco, RN BSN ?02/19/2022 ?11:30 AM ? ?

## 2022-02-19 NOTE — Progress Notes (Addendum)
?  ? ? Advanced Heart Failure Rounding Note ? ?PCP-Cardiologist: None  ? ?Subjective:   ? ?76 y/o with severe CAD/NSTEMI and iCM EF 30-35%. Developed refractory angina and shock with respiratory failure. Intubated and IABP placed.  ?4/7 CABG + MVR  ?4/8 Extubated, IABP removed  ?04/11 AFL >>converted shortly after starting amio gtt ? ?On empiric abx for possible PNA. Colchicine started for possible pericarditis. ?Remains on Zosyn. WBC improving, 29>>21>>18  Afebrile. BCx NGTD ? ?Limited echo 04/12: ?EF 35-40%, RMA, RV okay, mean gradient 7 across mitral valve prosthesis, large density in LA cavity that likely represents artifact  ? ?Wants to go home. Denies SOB.  ? ? ?Objective:   ?Weight Range: ?57.5 kg ?Body mass index is 21.77 kg/m?.  ? ?Vital Signs:   ?Temp:  [98.3 ?F (36.8 ?C)-98.8 ?F (37.1 ?C)] 98.7 ?F (37.1 ?C) (04/20 0809) ?Pulse Rate:  [89-96] 89 (04/20 0809) ?Resp:  [14-20] 20 (04/20 0809) ?BP: (103-122)/(51-64) 106/53 (04/20 0809) ?SpO2:  [96 %-98 %] 98 % (04/20 0809) ?Weight:  [57.5 kg] 57.5 kg (04/20 9509) ?Last BM Date : 02/18/22 ? ?Weight change: ?Filed Weights  ? 02/17/22 0500 02/18/22 0426 02/19/22 0605  ?Weight: 58 kg 57.8 kg 57.5 kg  ? ? ?Intake/Output:  ? ?Intake/Output Summary (Last 24 hours) at 02/19/2022 0925 ?Last data filed at 02/19/2022 3267 ?Gross per 24 hour  ?Intake 335.28 ml  ?Output --  ?Net 335.28 ml  ?  ? ? ?Physical Exam  ?General:  In bed.  No resp difficulty ?HEENT: normal ?Neck: supple. no JVD. Carotids 2+ bilat; no bruits. No lymphadenopathy or thryomegaly appreciated. ?Cor: PMI nondisplaced. Regular rate & rhythm. No rubs, gallops or murmurs. Sternal incision approximated.  ?Lungs: clear ?Abdomen: soft, nontender, nondistended. No hepatosplenomegaly. No bruits or masses. Good bowel sounds. ?Extremities: no cyanosis, clubbing, rash, edema ?Neuro: alert & orientedx3, cranial nerves grossly intact. moves all 4 extremities w/o difficulty. Affect pleasant ? ?Telemetry  ?SR 80-90s  personally checked.  ? ?Labs  ?  ?CBC ?Recent Labs  ?  02/18/22 ?0156 02/19/22 ?0120  ?WBC 21.9* 18.3*  ?NEUTROABS  --  14.0*  ?HGB 8.5* 8.8*  ?HCT 27.0* 27.8*  ?MCV 88.2 88.3  ?PLT 777* 839*  ? ?Basic Metabolic Panel ?Recent Labs  ?  02/17/22 ?1245 02/18/22 ?0156  ?NA 135 135  ?K 3.5 3.4*  ?CL 92* 96*  ?CO2 25 29  ?GLUCOSE 120* 106*  ?BUN 33* 21  ?CREATININE 0.94 0.83  ?CALCIUM 8.3* 8.1*  ?MG 2.3  --   ? ?Liver Function Tests ?No results for input(s): AST, ALT, ALKPHOS, BILITOT, PROT, ALBUMIN in the last 72 hours. ? ?No results for input(s): LIPASE, AMYLASE in the last 72 hours. ?Cardiac Enzymes ?No results for input(s): CKTOTAL, CKMB, CKMBINDEX, TROPONINI in the last 72 hours. ? ?BNP: ?BNP (last 3 results) ?Recent Labs  ?  02/02/22 ?2321  ?BNP 366.4*  ? ? ?ProBNP (last 3 results) ?No results for input(s): PROBNP in the last 8760 hours. ? ? ?D-Dimer ?No results for input(s): DDIMER in the last 72 hours. ?Hemoglobin A1C ?No results for input(s): HGBA1C in the last 72 hours. ? ?Fasting Lipid Panel ?No results for input(s): CHOL, HDL, LDLCALC, TRIG, CHOLHDL, LDLDIRECT in the last 72 hours. ? ?Thyroid Function Tests ?No results for input(s): TSH, T4TOTAL, T3FREE, THYROIDAB in the last 72 hours. ? ?Invalid input(s): FREET3 ? ? ?Other results: ? ? ?Imaging  ? ? ?No results found. ? ? ?Medications:   ? ? ?Scheduled Medications: ?  amiodarone  200 mg Oral BID  ? aspirin EC  81 mg Oral Daily  ? feeding supplement  237 mL Oral BID BM  ? fluticasone  1 spray Each Nare Daily  ? levothyroxine  25 mcg Oral Q0600  ? mouth rinse  15 mL Mouth Rinse BID  ? melatonin  3 mg Oral QHS  ? midodrine  2.5 mg Oral TID WC  ? multivitamin with minerals  1 tablet Oral Daily  ? pantoprazole  40 mg Oral Daily  ? rosuvastatin  5 mg Oral Daily  ? sodium chloride flush  10-40 mL Intracatheter Q12H  ? sodium chloride flush  3 mL Intravenous Q12H  ? spironolactone  25 mg Oral Daily  ? warfarin  5 mg Oral q1600  ? Warfarin - Physician Dosing Inpatient    Does not apply q1600  ? ? ?Infusions: ? sodium chloride    ? piperacillin-tazobactam (ZOSYN)  IV 3.375 g (02/19/22 5974)  ? ? ?PRN Medications: ?sodium chloride, bismuth subsalicylate, Gerhardt's butt cream, prochlorperazine, promethazine, simethicone, sodium chloride flush, sodium chloride flush, traMADol ? ? ? ?Patient Profile  ? ?76 y/o with severe CAD/NSTEMI and iCM EF 30-35%. Developed refractory angina and shock yesterday with respiratory failure. Intubated and IABP placed. Taken to OR 4/7 for CABG + MVR  ? ?Assessment/Plan  ? ?1. NSTEMI---> Cardiogenic Shock ?- HS Trop 163>8453> 6513 ?- Cath with 3V disease, elevated wedge, and reduced cardiac output. IABP placed in lab with Norepi/Milrinone.  ?- 4/7 s/p CABG + MVR ?- On ASA 325. Would typically give plavix post NSTEMI, but also on warfarin with recent MVR. ?- No s/s angina. Continue statin ?- Cardiac rehab  ?  ?2. Acute Hypoxemic Respiratory Failure ?- Extubated 4/8. ?- stable on RA  ? ?3. Acute HFrEF, ICM  -> cardiogenic shock ?- Echo EF 30-35% RV ok. Severe MR. EF improved on post CABG TEE. ?- Limited echo 04/12: EF 35-40%, RV okay, large density in LA cavity likely artifact ?- Off Milrinone, NE weaned off 04/13. IABP out ?- Continue midodrine 2.5 mg TID for BP support. Today SBP low 100s.  ?- Volume status stable. She does not need loop diuretics.  ?- Add farxiga 10 mg daily  ?- Hold off on Arb/BB.  ?- Continue Spiro to 25 mg daily  ?- Check BMET  ? ?4. Severe MR  ?- S/p MV replacement with 25 mm Mitris Resilia pericardial valve ?- Warfarin started per TCTS (will be on for 3 months) ?- INR 2.4  ? ?5. PVCs ?- improved, rare PVCs on tele  ?- On amio 200 mg BID for AFL. Continue twice a day x7 days then 200 mg daily.  ?- Keep K > 4.0 and Mg > 2.0  ?  ?6. Hypothyroidism  ?-On levothyroxine.  ?  ?7. AKI due to ATN/shock ?- resolved ?- monitor w/ diuretics ? ?8. Acute blood loss anemia ?- transfuse as needed to keep hgb >= 8.0 ?- Hgb stable at 8.8 ? ?9.  Leukocytosis ?- WBC 17-> 27 -> 29->21-->18 k ?- UA ok ?- PCT < 0.10, Lactic acid okay, BCx NGTD.  ?- Vanc + zosyn for possible PNA per CT surgery.  ?- Now on colchicine for possible pericarditis ? ?10. B/l Pleural Effusions ?- Improved with diuresis ?- lung u/s with IR 4/14 no significant effusion to tap ? ?11. Atrial Flutter ?- In SR.  ?- Continue amiodarone 200 mg twice daily x7 days then 200 mg daily.  ?- on warfarin, INR 1.9 ? ?  12. Hypokalemia ?- BMET pending.  ?- Continue Spiro to 25 mg daily  ? ?Add farxiga 10 mg daily  ?Continue spiro 25 mg daily  ?Continue amio 200 mg twice a day 7 days then down to 200 mg daily ?ASA 81 mg daily  ?Midodrine 2.5 mg tid  ? ?HF Follow up has been set up.  ? ?Can go home if renal function stable.  ? ?Length of Stay: 16 ? ?Darrick Grinder, NP  ?02/19/2022, 9:25 AM ? ?Advanced Heart Failure Team ?Pager [phone] (M-F; 7a - 5p)  ?Please contact [facility] for night-coverage after hours (5p -7a ) and weekends on [email or web address] ? ?Agree with above. She continues to improve. Denies SOB, orthopnea or PND.  ? ?Exam stable ] ?No edema. Remains in NSR.  ? ?She is ready for d/c. Would add SGLT2i. D/c meds as above. We will follow in HF Clinic next week.  ? ?Glori Bickers, MD  ?5:33 PM ? ?

## 2022-02-19 NOTE — Progress Notes (Signed)
4 chest tube sutures removed.  Steri-strips applied. ?

## 2022-02-19 NOTE — TOC CM/SW Note (Addendum)
HF TOC CM contacted [place], Freda Munro with referral for 3n1 bedside commode. [employer] will deliver to room. PT recommended OP Cardiac Rehab, will send message for attending to send referral. Jonnie Finner RN3 CCM, Heart Failure TOC CM [phone]  ?

## 2022-02-19 NOTE — Progress Notes (Signed)
CARDIAC REHAB PHASE I  ? ?Went to offer to walk with pt. Pt just ambulated and practiced stairs with PT. For d/c today. Will f/u to complete education once husband arrives.  ? ?1010-1020 ?Rufina Falco, RN BSN ?02/19/2022 ?10:19 AM ? ?

## 2022-02-19 NOTE — TOC CM/SW Note (Signed)
HF TOC spoke to [facility] and they will ship DME 3n1 bedside commode to the home. Jonnie Finner RN3 CCM, Heart Failure TOC CM [phone]  ?

## 2022-02-20 ENCOUNTER — Telehealth (HOSPITAL_COMMUNITY): Payer: Self-pay

## 2022-02-20 LAB — CULTURE, BLOOD (ROUTINE X 2)
Culture: NO GROWTH
Culture: NO GROWTH
Special Requests: ADEQUATE

## 2022-02-20 NOTE — Telephone Encounter (Signed)
Called patient to see if she is interested in the Cardiac Rehab Program. Patient expressed interest. Explained scheduling process and went over insurance, patient verbalized understanding. Will contact patient for scheduling once f/u has been completed. 

## 2022-02-20 NOTE — Telephone Encounter (Signed)
Pt insurance is active and benefits verified through [facility]. Co-pay $40.00, DED $0.00/$0.00 met, out of pocket $4,500.00/$0.00 met, co-insurance 0%. No pre-authorization required. Passport, 02/20/22 @ 11:43AM, TVJ#[id number] ?  ?Will contact patient to see if she is interested in the Cardiac Rehab Program. If interested, patient will need to complete follow up appt. Once completed, patient will be contacted for scheduling upon review by the RN Navigator. ?  ?

## 2022-02-23 ENCOUNTER — Other Ambulatory Visit: Payer: Self-pay

## 2022-02-23 ENCOUNTER — Ambulatory Visit: Payer: Medicare HMO

## 2022-02-23 DIAGNOSIS — E785 Hyperlipidemia, unspecified: Secondary | ICD-10-CM

## 2022-02-23 DIAGNOSIS — Z5181 Encounter for therapeutic drug level monitoring: Secondary | ICD-10-CM | POA: Diagnosis not present

## 2022-02-23 DIAGNOSIS — Z952 Presence of prosthetic heart valve: Secondary | ICD-10-CM | POA: Insufficient documentation

## 2022-02-23 DIAGNOSIS — Z7901 Long term (current) use of anticoagulants: Secondary | ICD-10-CM | POA: Insufficient documentation

## 2022-02-23 DIAGNOSIS — I4892 Unspecified atrial flutter: Secondary | ICD-10-CM

## 2022-02-23 LAB — POCT INR: INR: 2.8 (ref 2.0–3.0)

## 2022-02-23 NOTE — Progress Notes (Unsigned)
done

## 2022-02-23 NOTE — Patient Instructions (Signed)
TAKE 1 TABLET DAILY.  INR in 1 week.  A full discussion of the nature of anticoagulants has been carried out.  A benefit risk analysis has been presented to the patient, so that they understand the justification for choosing anticoagulation at this time. The need for frequent and regular monitoring, precise dosage adjustment and compliance is stressed.  Side effects of potential bleeding are discussed.  The patient should avoid any OTC items containing aspirin or ibuprofen, and should avoid great swings in general diet.  Avoid alcohol consumption.  Call if any signs of abnormal bleeding.  [phone]  

## 2022-02-24 LAB — POTASSIUM: Potassium: 5.2 mmol/L (ref 3.5–5.2)

## 2022-03-02 ENCOUNTER — Ambulatory Visit (INDEPENDENT_AMBULATORY_CARE_PROVIDER_SITE_OTHER): Payer: Medicare HMO

## 2022-03-02 DIAGNOSIS — I4892 Unspecified atrial flutter: Secondary | ICD-10-CM | POA: Diagnosis not present

## 2022-03-02 DIAGNOSIS — Z7901 Long term (current) use of anticoagulants: Secondary | ICD-10-CM | POA: Diagnosis not present

## 2022-03-02 DIAGNOSIS — Z952 Presence of prosthetic heart valve: Secondary | ICD-10-CM

## 2022-03-02 LAB — POCT INR: INR: 1.8 — AB (ref 2.0–3.0)

## 2022-03-02 NOTE — Patient Instructions (Signed)
TAKE 1.5 TABLETS TODAY AND THEN INCREASE TO 1 TABLET DAILY, EXCEPT 1.5 TABLETS ON Wednesday.  INR in 1 week. ? ?[id number] ?

## 2022-03-02 NOTE — Progress Notes (Signed)
? ?ADVANCED HF CLINIC CONSULT NOTE ? ? ?Primary Care: [name], MD ?HF Cardiologist: Dr. Haroldine Laws ? ?HPI: ?Tonya Hamilton is a 76 y.o. with h/o HTN, hyperlipidemia, hypothyroidism, diverticulitis, arthritis, GERD, and chronic/frequent UTI, and new diagnosis of CAD and systolic heart failure.  ?  ?Admitted 4/23 with acute systolic heart failure. Echo EF 30-35%,  mild LV hypertrophy of basal septal segment, akinetic inferior and posterior wall, hypokinetic anterolateral wall and entire apex, grade 3 DD, elevated LA pressure, RV normal, moderately elevated PASP with RVSP 65mHg, mildly dilated LA, large pleural effusion in the left lateral region, severe MR, aortic sclerosis. Given IV lasix and placed on BiPap due to hypoxia. Cardiology consulted and underwent R/LHC showing severe 3 vessel disease with occluded distal LCx-OM 3 with severe upstream disease as the culprit lesion but also significant disease in the LAD/diagonal system as well as RCA. RA 9 PAP 43/22 mean 32, PCWP 26, fick CO 3.2, CI 1.9> IABP placed in the lab. Placed milrinone + norepi + lasix gtts. Intubated in the lab. CT surgery consulted and underwent CABG + MVR on 02/06/22. She was extubated, IABP removed and drips weaned off. Had AFL but converted before initiating amio gtt. Limited echo post surgery showed EF 35-40%, RMA, RV okay, mean gradient 7 across mitral valve prosthesis, large density in LA cavity that likely represents artifact. GDMT titrated and she was discharged home, weight 126 lbs. ? ?Today she returns for post hospital HF follow up with her husband. Overall feeling fine. Some fatigue but improves with rest. Able to get around the house and walk on flat ground without significant dyspnea. Denies palpitations, abnormal bleeding, CP, dizziness, edema, or PND/Orthopnea. Appetite ok. No fever or chills. Weight at home stable. Taking all medications. [place] have to be out by 04/16/22,  [employer] [place] ? ? ?Cardiac Studies: ?- Echo (4/23): EF 30-35%,  mild LV hypertrophy of basal septal segment, akinetic inferior and posterior wall, hypokinetic anterolateral wall and entire apex, grade 3 DD, elevated LA pressure, RV normal, moderately elevated PASP with RVSP 571mg, mildly dilated LA, large pleural effusion in the left lateral region, severe MR, aortic sclerosis.  ? ?- L/RHC (4/23): EF 30-35%, severe multi vessel CAD with occluded distal LCx-OM 3 with severe upstream disease as the culprit lesion but also significant disease in the LAD/diagonal system as well as RCA. ? ?RAP mean 9 mmHg, RVP 47/6-10 mmHg; PAP 43/22 mmHg with a mean of 32 mmHg.  PCWP mean 26 mmHg with a V wave of 33 mmHg suggestive of MR. LVEDP-EDP 110/10 mmHg - 28 mmHg; ALP-MAP 113/66 mmHg-90 million mercury-this was prior to balloon pump and pressors being placed after intubation. ? ?- Ltd echo (post surgery): EF 35-40%, RMA, RV okay, mean gradient 7 across mitral valve prosthesis, large density in LA cavity that likely represents artifact ? ?Review of Systems: [y] = yes, '[ ]'$  = no  ? ?General: Weight gain '[ ]'$ ; Weight loss '[ ]'$ ; Anorexia '[ ]'$ ; Fatigue [ y]; Fever '[ ]'$ ; Chills '[ ]'$ ; Weakness '[ ]'$   ?Cardiac: Chest pain/pressure '[ ]'$ ; Resting SOB '[ ]'$ ; Exertional SOB '[ ]'$ ; Orthopnea '[ ]'$ ; Pedal Edema '[ ]'$ ; Palpitations '[ ]'$ ; Syncope '[ ]'$ ; Presyncope '[ ]'$ ; Paroxysmal nocturnal dyspnea'[ ]'$   ?Pulmonary: Cough '[ ]'$ ; Wheezing'[ ]'$ ; Hemoptysis'[ ]'$ ; Sputum '[ ]'$ ; Snoring '[ ]'$   ?GI: Vomiting'[ ]'$ ; Dysphagia'[ ]'$ ; Melena'[ ]'$ ; Hematochezia '[ ]'$ ; Heartburn'[ ]'$ ; Abdominal pain '[ ]'$ ;  Constipation '[ ]'$ ; Diarrhea '[ ]'$ ; BRBPR '[ ]'$   ?GU: Hematuria'[ ]'$ ; Dysuria '[ ]'$ ; Nocturia'[ ]'$   ?Vascular: Pain in legs with walking '[ ]'$ ; Pain in feet with lying flat '[ ]'$ ; Non-healing sores '[ ]'$ ; Stroke '[ ]'$ ; TIA '[ ]'$ ; Slurred speech '[ ]'$ ;  ?Neuro: Headaches'[ ]'$ ; Vertigo'[ ]'$ ; Seizures'[ ]'$ ; Paresthesias'[ ]'$ ;Blurred vision '[ ]'$ ; Diplopia '[ ]'$ ; Vision changes '[ ]'$   ?Ortho/Skin: Arthritis '[ ]'$ ; Joint pain  '[ ]'$ ; Muscle pain '[ ]'$ ; Joint swelling '[ ]'$ ; Back Pain '[ ]'$ ; Rash '[ ]'$   ?Psych: Depression'[ ]'$ ; Anxiety'[ ]'$   ?Heme: Bleeding problems '[ ]'$ ; Clotting disorders '[ ]'$ ; Anemia Blue.Reese ]  ?Endocrine: Diabetes '[ ]'$ ; Thyroid dysfunction[y ] ? ?Past Medical History:  ?Diagnosis Date  ? Acute respiratory failure ([place]) 02/03/2022  ? Arthritis   ? Cardiogenic shock ([place])   ? Colon polyps   ? Community acquired pneumonia   ? Diverticulitis   ? Hyperlipidemia   ? Hypertension   ? Hypothyroidism   ? PONV (postoperative nausea and vomiting)   ? Thyroid disease   ? UTI (urinary tract infection)   ? ?Current Outpatient Medications  ?Medication Sig Dispense Refill  ? amiodarone (PACERONE) 200 MG tablet Take 200 mg by mouth daily.    ? aspirin EC 81 MG EC tablet Take 1 tablet (81 mg total) by mouth daily. Swallow whole. 30 tablet 11  ? dapagliflozin propanediol (FARXIGA) 10 MG TABS tablet Take 1 tablet (10 mg total) by mouth daily. 30 tablet 1  ? fluticasone (FLONASE) 50 MCG/ACT nasal spray Place 1-2 sprays into both nostrils daily as needed for allergies or rhinitis.    ? levothyroxine (SYNTHROID) 25 MCG tablet TAKE 1 TABLET BY MOUTH EVERY DAY 30 tablet 0  ? midodrine (PROAMATINE) 2.5 MG tablet Take 1 tablet (2.5 mg total) by mouth 2 (two) times daily with a meal. 60 tablet 1  ? ondansetron (ZOFRAN ODT) 4 MG disintegrating tablet Take 1 tablet (4 mg total) by mouth every 8 (eight) hours as needed for nausea or vomiting. 10 tablet 0  ? rosuvastatin (CRESTOR) 5 MG tablet Take 1 tablet (5 mg total) by mouth daily. 30 tablet 1  ? spironolactone (ALDACTONE) 25 MG tablet Take 1 tablet (25 mg total) by mouth daily. 30 tablet 1  ? traMADol (ULTRAM) 50 MG tablet Take 1 tablet (50 mg total) by mouth every 6 (six) hours as needed for severe pain. 20 tablet 0  ? trimethoprim (TRIMPEX) 100 MG tablet Take 1 tablet (100 mg total) by mouth daily.    ? warfarin (COUMADIN) 3 MG tablet Take 1 tablet (3 mg total) by mouth daily at 4 PM. Or as directed 30 tablet 1  ?  betamethasone dipropionate 0.05 % cream Apply 1 application. topically 2 (two) times daily. Apply to right hand    ? bismuth subsalicylate (PEPTO BISMOL) 262 MG/15ML suspension Take 30 mLs by mouth every 6 (six) hours as needed for indigestion or diarrhea or loose stools.    ? Multiple Vitamin (MULTIVITAMIN WITH MINERALS) TABS tablet Take 1 tablet by mouth daily.    ? pantoprazole (PROTONIX) 40 MG tablet Take 40 mg by mouth daily.    ? ?No current facility-administered medications for this encounter.  ? ?Allergies  ?Allergen Reactions  ? Botox [Onabotulinumtoxina]   ?  Was given too much botox and at night, felt like her throat was closing up.   ? Diclofenac Other (See Comments)  ?  Unknown reaction per pt and SO  ?  Lisinopril Swelling  ? Simvastatin Other (See Comments)  ?  myalgias  ? Statins Other (See Comments)  ?  myalgias  ? Sulfa Antibiotics Itching  ? ?Social History  ? ?Socioeconomic History  ? Marital status: Married  ?  Spouse name: Juanda Crumble   ? Number of children: 2  ? Years of education: 58  ? Highest education level: Not on file  ?Occupational History  ? Not on file  ?Tobacco Use  ? Smoking status: Never  ? Smokeless tobacco: Never  ?Vaping Use  ? Vaping Use: Never used  ?Substance and Sexual Activity  ? Alcohol use: No  ? Drug use: No  ? Sexual activity: Not Currently  ?Other Topics Concern  ? Not on file  ?Social History Narrative  ? 2 cups coffee per day  ? Soda daily (1/day)  ? Lives with husband  ? Right handed  ? ?Social Determinants of Health  ? ?Financial Resource Strain: Low Risk   ? Difficulty of Paying Living Expenses: Not hard at all  ?Food Insecurity: No Food Insecurity  ? Worried About Charity fundraiser in the Last Year: Never true  ? Ran Out of Food in the Last Year: Never true  ?Transportation Needs: No Transportation Needs  ? Lack of Transportation (Medical): No  ? Lack of Transportation (Non-Medical): No  ?Physical Activity: Insufficiently Active  ? Days of Exercise per Week: 5 days   ? Minutes of Exercise per Session: 10 min  ?Stress: No Stress Concern Present  ? Feeling of Stress : Not at all  ?Social Connections: Socially Integrated  ? Frequency of Communication with Friends and Fami

## 2022-03-03 ENCOUNTER — Encounter (HOSPITAL_COMMUNITY): Payer: Self-pay

## 2022-03-03 ENCOUNTER — Ambulatory Visit (HOSPITAL_COMMUNITY)
Admit: 2022-03-03 | Discharge: 2022-03-03 | Disposition: A | Discharge status: Home / Self Care | Payer: Medicare HMO | Attending: Family Medicine | Admitting: Family Medicine

## 2022-03-03 ENCOUNTER — Telehealth (HOSPITAL_COMMUNITY): Payer: Self-pay | Admitting: Surgery

## 2022-03-03 VITALS — BP 144/88 | HR 98 | Ht 64.5 in | Wt 127.4 lb

## 2022-03-03 DIAGNOSIS — I2510 Atherosclerotic heart disease of native coronary artery without angina pectoris: Secondary | ICD-10-CM

## 2022-03-03 DIAGNOSIS — I252 Old myocardial infarction: Secondary | ICD-10-CM | POA: Diagnosis not present

## 2022-03-03 DIAGNOSIS — Z7901 Long term (current) use of anticoagulants: Secondary | ICD-10-CM | POA: Diagnosis not present

## 2022-03-03 DIAGNOSIS — I493 Ventricular premature depolarization: Secondary | ICD-10-CM | POA: Diagnosis not present

## 2022-03-03 DIAGNOSIS — I340 Nonrheumatic mitral (valve) insufficiency: Secondary | ICD-10-CM | POA: Diagnosis not present

## 2022-03-03 DIAGNOSIS — Z79899 Other long term (current) drug therapy: Secondary | ICD-10-CM | POA: Insufficient documentation

## 2022-03-03 DIAGNOSIS — J90 Pleural effusion, not elsewhere classified: Secondary | ICD-10-CM

## 2022-03-03 DIAGNOSIS — I5022 Chronic systolic (congestive) heart failure: Secondary | ICD-10-CM

## 2022-03-03 DIAGNOSIS — I700 Atherosclerosis of aorta: Secondary | ICD-10-CM | POA: Diagnosis not present

## 2022-03-03 DIAGNOSIS — I110 Hypertensive heart disease with heart failure: Secondary | ICD-10-CM | POA: Diagnosis not present

## 2022-03-03 DIAGNOSIS — K5792 Diverticulitis of intestine, part unspecified, without perforation or abscess without bleeding: Secondary | ICD-10-CM | POA: Diagnosis not present

## 2022-03-03 DIAGNOSIS — E039 Hypothyroidism, unspecified: Secondary | ICD-10-CM

## 2022-03-03 DIAGNOSIS — N179 Acute kidney failure, unspecified: Secondary | ICD-10-CM | POA: Insufficient documentation

## 2022-03-03 DIAGNOSIS — Z7984 Long term (current) use of oral hypoglycemic drugs: Secondary | ICD-10-CM | POA: Insufficient documentation

## 2022-03-03 DIAGNOSIS — Z951 Presence of aortocoronary bypass graft: Secondary | ICD-10-CM | POA: Insufficient documentation

## 2022-03-03 DIAGNOSIS — Z7982 Long term (current) use of aspirin: Secondary | ICD-10-CM | POA: Diagnosis not present

## 2022-03-03 DIAGNOSIS — Z952 Presence of prosthetic heart valve: Secondary | ICD-10-CM | POA: Diagnosis not present

## 2022-03-03 DIAGNOSIS — I4892 Unspecified atrial flutter: Secondary | ICD-10-CM

## 2022-03-03 DIAGNOSIS — D649 Anemia, unspecified: Secondary | ICD-10-CM

## 2022-03-03 LAB — BASIC METABOLIC PANEL
Anion gap: 8 (ref 5–15)
BUN: 12 mg/dL (ref 8–23)
CO2: 26 mmol/L (ref 22–32)
Calcium: 9.2 mg/dL (ref 8.9–10.3)
Chloride: 108 mmol/L (ref 98–111)
Creatinine, Ser: 0.9 mg/dL (ref 0.44–1.00)
GFR, Estimated: >60 mL/min (ref >60)
Glucose, Bld: 114 mg/dL — ABNORMAL HIGH (ref 70–99)
Potassium: 5.5 mmol/L — ABNORMAL HIGH (ref 3.5–5.1)
Sodium: 142 mmol/L (ref 135–145)

## 2022-03-03 LAB — CBC
HCT: 37.2 % (ref 36.0–46.0)
Hemoglobin: 11.1 g/dL — ABNORMAL LOW (ref 12.0–15.0)
MCH: 26.8 pg (ref 26.0–34.0)
MCHC: 29.8 g/dL — ABNORMAL LOW (ref 30.0–36.0)
MCV: 89.9 fL (ref 80.0–100.0)
Platelets: 572 10*3/uL — ABNORMAL HIGH (ref 150–400)
RBC: 4.14 MIL/uL (ref 3.87–5.11)
RDW: 15.6 % — ABNORMAL HIGH (ref 11.5–15.5)
WBC: 10.3 10*3/uL (ref 4.0–10.5)
nRBC: 0 % (ref 0.0–0.2)

## 2022-03-03 NOTE — Telephone Encounter (Signed)
Patient contacted to review results and recommendations per provider.  She is aware to stop Spironolactone and will return for labs May 9th.  Medication list updated in [facility]. ?

## 2022-03-03 NOTE — Telephone Encounter (Signed)
-----   Message from Rafael Bihari, [place] sent at 03/03/2022 12:00 PM EDT ----- ?K is too high. Please stop spiro. Repeat BMET in 1 week ?

## 2022-03-03 NOTE — Patient Instructions (Signed)
Thank you for coming in today ? ?Labs were done today, if any labs are abnormal the clinic will call you ?No news is good news ? ?STOP Midodrine  ? ?Your physician recommends that you schedule a follow-up appointment in:  ?2 weeks and the again 4-5 weeks in clinic  ? ?At the [facility], you and your health needs are our priority. As part of our continuing mission to provide you with exceptional heart care, we have created designated Provider Care Teams. These Care Teams include your primary Cardiologist (physician) and Advanced Practice Providers (APPs- Physician Assistants and Nurse Practitioners) who all work together to provide you with the care you need, when you need it.  ? ?You may see any of the following providers on your designated Care Team at your next follow up: ?Dr Glori Bickers ?Dr Loralie Champagne ?Darrick Grinder, NP ?Lyda Jester, PA ?Jessica Milford,NP ?Marlyce Huge, PA ?Audry Riles, PharmD ? ? ?Please be sure to bring in all your medications bottles to every appointment.  ? ?If you have any questions or concerns before your next appointment please send [place] a message through [place] or call our office at [phone].   ? ?TO LEAVE A MESSAGE FOR THE NURSE SELECT OPTION 2, PLEASE LEAVE A MESSAGE INCLUDING: ?YOUR NAME ?DATE OF BIRTH ?CALL BACK NUMBER ?REASON FOR CALL**this is important as we prioritize the call backs ? ?YOU WILL RECEIVE A CALL BACK THE SAME DAY AS LONG AS YOU CALL BEFORE 4:00 PM ?  ?

## 2022-03-04 ENCOUNTER — Telehealth (HOSPITAL_COMMUNITY): Payer: Self-pay | Admitting: *Deleted

## 2022-03-04 MED ORDER — FLUCONAZOLE 150 MG PO TABS: 150.0000 mg | ORAL_TABLET | Freq: Once | ORAL | 0 refills | Status: AC

## 2022-03-04 NOTE — Telephone Encounter (Signed)
Pt aware and agreeable with plan.  

## 2022-03-04 NOTE — Telephone Encounter (Signed)
Pt said she was supposed to call if her yeast infection became worse. Pt said she noticed it was worse last night and today. Pt also asked if it is ok to take dulcolax for constipation.  ? ?Routed to [employer] ?

## 2022-03-05 ENCOUNTER — Telehealth: Payer: Self-pay

## 2022-03-05 ENCOUNTER — Telehealth (HOSPITAL_COMMUNITY): Payer: Self-pay | Admitting: *Deleted

## 2022-03-05 NOTE — Telephone Encounter (Signed)
Pts husband left VM requesting a call from our office about possible interactions with diflucan and pts medications. He said they picked up the medication from the pharmacy and the pharmacist said there could be bad interactions. Pt was prescribed diflucan for yeast infection from [place]. ? ?Message routed to [place] ?

## 2022-03-05 NOTE — Telephone Encounter (Signed)
I spoke to pt's husband who called to inform us that the patient was prescribed 1 dose of Fluconazole so I had her hold Coumadin this evening 5/4.  They verbalized understanding and we will check INR 5/11. ?

## 2022-03-05 NOTE — Telephone Encounter (Signed)
Reviewed medication profile. Fluconazole has interactions with both amiodarone and warfarin. Fluconazole can enhance the Qtc-prolonging effect of amiodarone. Patient's last Qtc interval on 03/02/22 was 414. Given that she is only taking a one time dose of fluconazole, this is safe to combine. The fluconazole can also increase INR for patients on warfarin. Since the fluconazole is low dose and only a one time dose, this is safe to combine. I did instruct the patient to reach out to their Coumadin Clinic so they are aware. Patient was very grateful for call.

## 2022-03-07 ENCOUNTER — Other Ambulatory Visit: Payer: Self-pay | Admitting: Family Medicine

## 2022-03-10 ENCOUNTER — Ambulatory Visit (HOSPITAL_COMMUNITY)
Admission: RE | Admit: 2022-03-10 | Discharge: 2022-03-10 | Disposition: A | Discharge status: Home / Self Care | Payer: Medicare HMO | Source: Ambulatory Visit | Attending: Cardiology | Admitting: Cardiology

## 2022-03-10 DIAGNOSIS — I5022 Chronic systolic (congestive) heart failure: Secondary | ICD-10-CM

## 2022-03-10 LAB — BASIC METABOLIC PANEL
Anion gap: 7 (ref 5–15)
BUN: 8 mg/dL (ref 8–23)
CO2: 27 mmol/L (ref 22–32)
Calcium: 8.8 mg/dL — ABNORMAL LOW (ref 8.9–10.3)
Chloride: 106 mmol/L (ref 98–111)
Creatinine, Ser: 0.7 mg/dL (ref 0.44–1.00)
GFR, Estimated: >60 mL/min (ref >60)
Glucose, Bld: 130 mg/dL — ABNORMAL HIGH (ref 70–99)
Potassium: 4.6 mmol/L (ref 3.5–5.1)
Sodium: 140 mmol/L (ref 135–145)

## 2022-03-12 ENCOUNTER — Ambulatory Visit: Payer: Medicare HMO

## 2022-03-12 DIAGNOSIS — Z7901 Long term (current) use of anticoagulants: Secondary | ICD-10-CM | POA: Diagnosis not present

## 2022-03-12 DIAGNOSIS — Z952 Presence of prosthetic heart valve: Secondary | ICD-10-CM

## 2022-03-12 DIAGNOSIS — I4892 Unspecified atrial flutter: Secondary | ICD-10-CM

## 2022-03-12 LAB — POCT INR: INR: 1.9 — AB (ref 2.0–3.0)

## 2022-03-12 NOTE — Patient Instructions (Signed)
TAKE 2 TABLETS TODAY ONLY and then continue 1 tablet Daily, except 1.5 Wednesday.  INR in 2 weeks. ? ?[id number] ?

## 2022-03-13 ENCOUNTER — Other Ambulatory Visit: Payer: Self-pay | Admitting: Physician Assistant

## 2022-03-14 ENCOUNTER — Other Ambulatory Visit: Payer: Self-pay | Admitting: Physician Assistant

## 2022-03-16 NOTE — Progress Notes (Addendum)
? ?ADVANCED HF CLINIC NOTE ? ? ?Primary Care: [name], MD ?HF Cardiologist: Dr. Haroldine Laws ? ?HPI: ?[name] is a 76 y.o. with h/o HTN, hyperlipidemia, hypothyroidism, diverticulitis, arthritis, GERD, and chronic/frequent UTI, and new diagnosis of CAD and systolic heart failure.  ?  ?Admitted 4/23 with acute systolic heart failure. Echo EF 30-35%,  mild LV hypertrophy of basal septal segment, akinetic inferior and posterior wall, hypokinetic anterolateral wall and entire apex, grade 3 DD, elevated LA pressure, RV normal, moderately elevated PASP with RVSP 5mHg, mildly dilated LA, large pleural effusion in the left lateral region, severe MR, aortic sclerosis. Given IV lasix and placed on BiPap due to hypoxia. Cardiology consulted and underwent R/LHC showing severe 3 vessel disease with occluded distal LCx-OM 3 with severe upstream disease as the culprit lesion but also significant disease in the LAD/diagonal system as well as RCA. RA 9 PAP 43/22 mean 32, PCWP 26, fick CO 3.2, CI 1.9> IABP placed in the lab. Placed milrinone + norepi + lasix gtts. Intubated in the lab. CT surgery consulted and underwent CABG + MVR on 02/06/22. She was extubated, IABP removed and drips weaned off. Had AFL but converted before initiating amio gtt. Limited echo post surgery showed EF 35-40%, RMA, RV okay, mean gradient 7 across mitral valve prosthesis, large density in LA cavity that likely represents artifact. GDMT titrated and she was discharged home, weight 126 lbs. ? ?Post hospital follow up 5/23, feeling well. [place] [facility] [place] Midodrine stopped with plans to add back GDMT. Labs showed K 5.5 and spiro stopped.  ? ?Today she returns for HF follow up with her husband. Overall feeling fine. [place] walking [place]. Denies palpitations, abnormal bleeding, CP, dizziness, edema, or PND/Orthopnea. Appetite ok. No fever or chills. Weight at home  127 pounds. Taking all medications. No further yeast infections. Planning on [place]locating to [place] on [place]16[place].  ? ?Cardiac Studies: ?- Echo (4/23): EF 30-35%,  mild LV hypertrophy of basal septal segment, akinetic inferior and posterior wall, hypokinetic anterolateral wall and entire apex, grade 3 DD, elevated LA pressure, RV normal, moderately elevated PASP with RVSP 570mg, mildly dilated LA, large pleural effusion in the left lateral region, severe MR, aortic sclerosis.  ? ?- L/RHC (4/23): EF 30-35%, severe multi vessel CAD with occluded distal LCx-OM 3 with severe upstream disease as the culprit lesion but also significant disease in the LAD/diagonal system as well as RCA. ? ?RAP mean 9 mmHg, RVP 47/6-10 mmHg; PAP 43/22 mmHg with a mean of 32 mmHg.  PCWP mean 26 mmHg with a V wave of 33 mmHg suggestive of MR. LVEDP-EDP 110/10 mmHg - 28 mmHg; ALP-MAP 113/66 mmHg-90 million mercury-this was prior to balloon pump and pressors being placed after intubation. ? ?- Ltd echo (post surgery): EF 35-40%, RMA, RV okay, mean gradient 7 across mitral valve prosthesis, large density in LA cavity that likely represents artifact ? ?Past Medical History:  ?Diagnosis Date  ? [place]  ? Arthritis   ? [place] ([place]  ? Colon polyps   ? Community acquired pneumonia   ? Diverticulitis   ? Hyperlipidemia   ? Hypertension   ? Hypothyroidism   ? PONV (postoperative nausea and vomiting)   ? Thyroid disease   ? UTI (urinary tract infection)   ? ?Current Outpatient Medications  ?Medication Sig Dispense Refill  ? amiodarone (PACERONE) 200 MG  tablet Take 200 mg by mouth daily.    ? aspirin EC 81 MG EC tablet Take 1 tablet (81 mg total) by mouth daily. Swallow whole. 30 tablet 11  ? betamethasone dipropionate 0.05 % cream Apply 1 application. topically 2 (two) times daily. Apply to right hand    ? bismuth subsalicylate (PEPTO BISMOL) 262 MG/15ML suspension Take 30 mLs by mouth every  6 (six) hours as needed for indigestion or diarrhea or loose stools.    ? fluticasone (FLONASE) 50 MCG/ACT nasal spray Place 1-2 sprays into both nostrils daily as needed for allergies or rhinitis.    ? levothyroxine (SYNTHROID) 25 MCG tablet TAKE 1 TABLET BY MOUTH EVERY DAY 30 tablet 0  ? Multiple Vitamin (MULTIVITAMIN WITH MINERALS) TABS tablet Take 1 tablet by mouth daily.    ? ondansetron (ZOFRAN ODT) 4 MG disintegrating tablet Take 1 tablet (4 mg total) by mouth every 8 (eight) hours as needed for nausea or vomiting. 10 tablet 0  ? pantoprazole (PROTONIX) 40 MG tablet Take 40 mg by mouth daily.    ? rosuvastatin (CRESTOR) 5 MG tablet Take 1 tablet (5 mg total) by mouth daily. 30 tablet 1  ? traMADol (ULTRAM) 50 MG tablet Take 1 tablet (50 mg total) by mouth every 6 (six) hours as needed for severe pain. 20 tablet 0  ? trimethoprim (TRIMPEX) 100 MG tablet Take 1 tablet (100 mg total) by mouth daily.    ? warfarin (COUMADIN) 3 MG tablet Take 1 tablet (3 mg total) by mouth daily at 4 PM. Or as directed 30 tablet 1  ? ?No current facility-administered medications for this encounter.  ? ?Allergies  ?Allergen Reactions  ? Botox [Onabotulinumtoxina]   ?  Was given too much botox and at night, felt like her throat was closing up.   ? Diclofenac Other (See Comments)  ?  Unknown reaction per pt and SO  ? Lisinopril Swelling  ? Simvastatin Other (See Comments)  ?  myalgias  ? Statins Other (See Comments)  ?  myalgias  ? Sulfa Antibiotics Itching  ? ?Social History  ? ?Socioeconomic History  ? Marital status: Married  ?  Spouse name: Juanda Crumble   ? Number of children: 2  ? Years of education: 21  ? Highest education level: Not on file  ?Occupational History  ? Not on file  ?Tobacco Use  ? Smoking status: Never  ? Smokeless tobacco: Never  ?Vaping Use  ? Vaping Use: Never used  ?Substance and Sexual Activity  ? Alcohol use: No  ? Drug use: No  ? Sexual activity: Not Currently  ?Other Topics Concern  ? Not on file  ?Social  History Narrative  ? 2 cups coffee per day  ? Soda daily (1/day)  ? Lives with husband  ? Right handed  ? ?Social Determinants of Health  ? ?Financial Resource Strain: Low Risk   ? Difficulty of Paying Living Expenses: Not hard at all  ?Food Insecurity: No Food Insecurity  ? Worried About Charity fundraiser in the Last Year: Never true  ? Ran Out of Food in the Last Year: Never true  ?Transportation Needs: No Transportation Needs  ? Lack of Transportation (Medical): No  ? Lack of Transportation (Non-Medical): No  ?Physical Activity: Insufficiently Active  ? Days of Exercise per Week: 5 days  ? Minutes of Exercise per Session: 10 min  ?Stress: No Stress Concern Present  ? Feeling of Stress : Not at all  ?Social Connections:  Socially Integrated  ? Frequency of Communication with Friends and Family: Twice a week  ? Frequency of Social Gatherings with Friends and Family: Twice a week  ? Attends Religious Services: More than 4 times per year  ? Active Member of Clubs or Organizations: Yes  ? Attends Archivist Meetings: More than 4 times per year  ? Marital Status: Married  ?Intimate Partner Violence: Not At Risk  ? Fear of Current or Ex-Partner: No  ? Emotionally Abused: No  ? Physically Abused: No  ? Sexually Abused: No  ? ?Family History  ?Problem Relation Age of Onset  ? Arthritis Mother   ? Hypertension Mother   ? Heart disease Mother   ? Hypertension Father   ? Stroke Father   ? Heart disease Father   ? Heart Problems Maternal Grandfather   ? Breast cancer Neg Hx   ? ?BP 130/90   Pulse 99   Ht '5\' 4"'$  (1.626 m)   Wt 58.2 kg (128 lb 6.4 oz)   SpO2 96%   BMI 22.04 kg/m?  ? ?Wt Readings from Last 3 Encounters:  ?03/18/22 58.2 kg (128 lb 6.4 oz)  ?03/03/22 57.8 kg (127 lb 6.4 oz)  ?02/19/22 57.5 kg (126 lb 12.8 oz)  ? ?PHYSICAL EXAM: ?General:  NAD. No resp difficulty ?HEENT: Normal ?Neck: Supple. No JVD. Carotids 2+ bilat; no bruits. No lymphadenopathy or thryomegaly appreciated. ?Cor: PMI nondisplaced.  Regular rate & rhythm. No rubs, gallops or murmurs. ?Lungs: Clear ?Abdomen: Soft, nontender, nondistended. No hepatosplenomegaly. No bruits or masses. Good bowel sounds. ?Extremities: No cyanosis, clubbing, ras

## 2022-03-18 ENCOUNTER — Encounter (HOSPITAL_COMMUNITY): Payer: Self-pay

## 2022-03-18 ENCOUNTER — Ambulatory Visit (HOSPITAL_COMMUNITY)
Admission: RE | Admit: 2022-03-18 | Discharge: 2022-03-18 | Disposition: A | Discharge status: Home / Self Care | Payer: Medicare HMO | Source: Ambulatory Visit | Attending: Family Medicine | Admitting: Family Medicine

## 2022-03-18 ENCOUNTER — Other Ambulatory Visit: Payer: Self-pay | Admitting: Family Medicine

## 2022-03-18 ENCOUNTER — Ambulatory Visit (INDEPENDENT_AMBULATORY_CARE_PROVIDER_SITE_OTHER): Payer: Medicare HMO | Admitting: Family Medicine

## 2022-03-18 ENCOUNTER — Encounter: Payer: Self-pay | Admitting: Family Medicine

## 2022-03-18 VITALS — BP 122/64 | HR 98 | Temp 97.7°F | Ht 64.0 in | Wt 129.3 lb

## 2022-03-18 VITALS — BP 130/90 | HR 99 | Ht 64.0 in | Wt 128.4 lb

## 2022-03-18 DIAGNOSIS — I252 Old myocardial infarction: Secondary | ICD-10-CM | POA: Diagnosis not present

## 2022-03-18 DIAGNOSIS — I493 Ventricular premature depolarization: Secondary | ICD-10-CM

## 2022-03-18 DIAGNOSIS — I4892 Unspecified atrial flutter: Secondary | ICD-10-CM

## 2022-03-18 DIAGNOSIS — Z7901 Long term (current) use of anticoagulants: Secondary | ICD-10-CM | POA: Diagnosis not present

## 2022-03-18 DIAGNOSIS — E875 Hyperkalemia: Secondary | ICD-10-CM | POA: Insufficient documentation

## 2022-03-18 DIAGNOSIS — K219 Gastro-esophageal reflux disease without esophagitis: Secondary | ICD-10-CM | POA: Diagnosis not present

## 2022-03-18 DIAGNOSIS — E039 Hypothyroidism, unspecified: Secondary | ICD-10-CM

## 2022-03-18 DIAGNOSIS — Z79890 Hormone replacement therapy (postmenopausal): Secondary | ICD-10-CM | POA: Insufficient documentation

## 2022-03-18 DIAGNOSIS — I110 Hypertensive heart disease with heart failure: Secondary | ICD-10-CM | POA: Insufficient documentation

## 2022-03-18 DIAGNOSIS — I5022 Chronic systolic (congestive) heart failure: Secondary | ICD-10-CM

## 2022-03-18 DIAGNOSIS — I2510 Atherosclerotic heart disease of native coronary artery without angina pectoris: Secondary | ICD-10-CM | POA: Diagnosis not present

## 2022-03-18 DIAGNOSIS — J90 Pleural effusion, not elsewhere classified: Secondary | ICD-10-CM

## 2022-03-18 DIAGNOSIS — E785 Hyperlipidemia, unspecified: Secondary | ICD-10-CM | POA: Diagnosis not present

## 2022-03-18 DIAGNOSIS — Z951 Presence of aortocoronary bypass graft: Secondary | ICD-10-CM | POA: Diagnosis not present

## 2022-03-18 DIAGNOSIS — I214 Non-ST elevation (NSTEMI) myocardial infarction: Secondary | ICD-10-CM | POA: Diagnosis not present

## 2022-03-18 DIAGNOSIS — D649 Anemia, unspecified: Secondary | ICD-10-CM | POA: Diagnosis not present

## 2022-03-18 DIAGNOSIS — Z87440 Personal history of urinary (tract) infections: Secondary | ICD-10-CM | POA: Insufficient documentation

## 2022-03-18 DIAGNOSIS — Z79899 Other long term (current) drug therapy: Secondary | ICD-10-CM | POA: Insufficient documentation

## 2022-03-18 DIAGNOSIS — M1990 Unspecified osteoarthritis, unspecified site: Secondary | ICD-10-CM | POA: Diagnosis not present

## 2022-03-18 DIAGNOSIS — Z952 Presence of prosthetic heart valve: Secondary | ICD-10-CM | POA: Diagnosis not present

## 2022-03-18 LAB — BASIC METABOLIC PANEL
Anion gap: 8 (ref 5–15)
BUN: 10 mg/dL (ref 8–23)
CO2: 25 mmol/L (ref 22–32)
Calcium: 8.6 mg/dL — ABNORMAL LOW (ref 8.9–10.3)
Chloride: 108 mmol/L (ref 98–111)
Creatinine, Ser: 0.77 mg/dL (ref 0.44–1.00)
GFR, Estimated: >60 mL/min (ref >60)
Glucose, Bld: 110 mg/dL — ABNORMAL HIGH (ref 70–99)
Potassium: 3.8 mmol/L (ref 3.5–5.1)
Sodium: 141 mmol/L (ref 135–145)

## 2022-03-18 MED ORDER — CARVEDILOL 3.125 MG PO TABS: 3.1250 mg | ORAL_TABLET | Freq: Two times a day (BID) | ORAL | 3 refills | Status: AC

## 2022-03-18 MED ORDER — PANTOPRAZOLE SODIUM 40 MG PO TBEC: 40.0000 mg | DELAYED_RELEASE_TABLET | Freq: Every day | ORAL | 1 refills | Status: AC

## 2022-03-18 MED ORDER — ONDANSETRON 4 MG PO TBDP: 4.0000 mg | ORAL_TABLET | Freq: Three times a day (TID) | ORAL | 0 refills | Status: AC | PRN

## 2022-03-18 MED ORDER — AMIODARONE HCL 200 MG PO TABS: 100.0000 mg | ORAL_TABLET | Freq: Every day | ORAL | 1 refills | Status: DC

## 2022-03-18 NOTE — Patient Instructions (Signed)
Thank you for coming in today ? ?Labs were done today, if any labs are abnormal the clinic will call you ?No news is good news ? ?START Carvedilol 3.125 mg twice daily  ? ?DECREASE Amiodarone to 100 mg 1/2 tablet daily  ? ?Your physician recommends that you schedule a follow-up appointment in:  ?1-2 weeks with Pharmacy ? ?At the [facility], you and your health needs are our priority. As part of our continuing mission to provide you with exceptional heart care, we have created designated Provider Care Teams. These Care Teams include your primary Cardiologist (physician) and Advanced Practice Providers (APPs- Physician Assistants and Nurse Practitioners) who all work together to provide you with the care you need, when you need it.  ? ?You may see any of the following providers on your designated Care Team at your next follow up: ?Dr Glori Bickers ?Dr Loralie Champagne ?Darrick Grinder, NP ?Lyda Jester, PA ?Jessica Milford,NP ?Marlyce Huge, PA ?Audry Riles, PharmD ? ? ?Please be sure to bring in all your medications bottles to every appointment.  ? ?If you have any questions or concerns before your next appointment please send [place] a message through [place] or call our office at [phone].   ? ?TO LEAVE A MESSAGE FOR THE NURSE SELECT OPTION 2, PLEASE LEAVE A MESSAGE INCLUDING: ?YOUR NAME ?DATE OF BIRTH ?CALL BACK NUMBER ?REASON FOR CALL**this is important as we prioritize the call backs ? ?YOU WILL RECEIVE A CALL BACK THE SAME DAY AS LONG AS YOU CALL BEFORE 4:00 PM ? ?

## 2022-03-18 NOTE — Progress Notes (Signed)
Established Patient Office Visit  Subjective   Patient ID: Tonya Hamilton, female    DOB: [birth year]  Age: 76 y.o. MRN: [id number]  Chief Complaint  Patient presents with   Follow-up    HPI   Tonya Hamilton is seen regarding recent hospital follow-up.  She presented to the ER with dyspnea.  Was admitted April 23 with systolic heart failure.  Echo revealed ejection fraction 30 to 35% with some inferior akinesis as well as hypokinetic anterolateral wall and entire apex.  She was noted to have severe mitral regurgitation.  Cath showed severe three-vessel disease.  Cardiothoracic surgery consulted and patient underwent CABG and mitral valve replacement April 7.  Developed some atrial flutter following surgery and was placed on amiodarone.  She is doing well at this time.  She and her husband plan to move to [facility] middle of June.  She had been tried on Aldactone but had hyperkalemia.  Weights have been stable.  No peripheral edema.  She did apparently have yeast infection on Farxiga and this was discontinued.  Current medications include amiodarone (which was reduced to 100 mg today), aspirin 81 mg, carvedilol 3.125 mg twice daily which was started today, levothyroxine 25 mcg daily, Protonix 40 mg daily, rosuvastatin 5 mg daily, trimethoprim 100 mg daily, and Coumadin dosing per Coumadin clinic.  Past Medical History:  Diagnosis Date   Acute respiratory failure ([place]) 02/03/2022   Arthritis    Cardiogenic shock ([facility])    Colon polyps    Community acquired pneumonia    Diverticulitis    Hyperlipidemia    Hypertension    Hypothyroidism    PONV (postoperative nausea and vomiting)    Thyroid disease    UTI (urinary tract infection)    Past Surgical History:  Procedure Laterality Date   ABDOMINAL HYSTERECTOMY  1993   TAH, endometriosis   CHOLECYSTECTOMY  2002   CORONARY ARTERY BYPASS GRAFT N/A 02/06/2022   Procedure: CORONARY ARTERY BYPASS GRAFTING X4, Using Left and Right  Greater Saphenous Vein Harvested Endoscopically;  Surgeon: [name], MD;  Location: [place];  Service: Open Heart Surgery;  Laterality: N/A;   IABP INSERTION N/A 02/03/2022   Procedure: IABP Insertion;  Surgeon: Leonie Man, MD;  Location: [place] [facility];  Service: Cardiovascular;  Laterality: N/A;   MITRAL VALVE REPLACEMENT N/A 02/06/2022   Procedure: MITRAL VALVE (MV) REPLACEMENT Using 25MM Mitris Valve;  Surgeon: [name], MD;  Location: [place] [facility];  Service: Open Heart Surgery;  Laterality: N/A;   RIGHT/LEFT HEART CATH AND CORONARY ANGIOGRAPHY N/A 02/03/2022   Procedure: RIGHT/LEFT HEART CATH AND CORONARY ANGIOGRAPHY;  Surgeon: Leonie Man, MD;  Location: [place] [facility];  Service: Cardiovascular;  Laterality: N/A;   TEE WITHOUT CARDIOVERSION N/A 02/06/2022   Procedure: TRANSESOPHAGEAL ECHOCARDIOGRAM (TEE);  Surgeon: [name], MD;  Location: [place];  Service: Open Heart Surgery;  Laterality: N/A;   TOTAL HIP ARTHROPLASTY Right 05/31/2013   Procedure: RIGHT TOTAL HIP ARTHROPLASTY ANTERIOR APPROACH;  Surgeon: Gearlean Alf, MD;  Location: [facility];  Service: Orthopedics;  Laterality: Right;    reports that she has never smoked. She has never used smokeless tobacco. She reports that she does not drink alcohol and does not use drugs. family history includes Arthritis in her mother; Heart Problems in her maternal grandfather; Heart disease in her father and mother; Hypertension in her father and mother; Stroke in her father. Allergies  Allergen Reactions   Botox [Onabotulinumtoxina]  Was given too much botox and at night, felt like her throat was closing up.    Diclofenac Other (See Comments)    Unknown reaction per pt and SO   Lisinopril Swelling   Simvastatin Other (See Comments)    myalgias   Statins Other (See Comments)    myalgias   Sulfa Antibiotics Itching    Review of Systems  Constitutional:  Negative for chills and fever.  Respiratory:  Negative for  cough, shortness of breath and wheezing.   Cardiovascular:  Negative for chest pain.  Gastrointestinal:  Negative for abdominal pain.  Genitourinary:  Negative for dysuria.     Objective:     BP 122/64 (BP Location: Right Arm, Patient Position: Sitting, Cuff Size: Normal)   Pulse 98   Temp 97.7 F (36.5 C) (Oral)   Ht '5\' 4"'$  (1.626 m)   Wt 129 lb 4.8 oz (58.7 kg)   SpO2 96%   BMI 22.19 kg/m    Physical Exam Vitals reviewed.  Constitutional:      Appearance: Normal appearance.  Cardiovascular:     Rate and Rhythm: Normal rate and regular rhythm.  Pulmonary:     Effort: Pulmonary effort is normal.     Comments: Slightly decreased breath sounds right base compared with left.  No rales.  No wheezes. Musculoskeletal:     Right lower leg: No edema.     Left lower leg: No edema.  Neurological:     General: No focal deficit present.     Mental Status: She is alert.     Results for orders placed or performed during the hospital encounter of 65/78/46  Basic Metabolic Panel (BMET)  Result Value Ref Range   Sodium 141 135 - 145 mmol/L   Potassium 3.8 3.5 - 5.1 mmol/L   Chloride 108 98 - 111 mmol/L   CO2 25 22 - 32 mmol/L   Glucose, Bld 110 (H) 70 - 99 mg/dL   BUN 10 8 - 23 mg/dL   Creatinine, Ser 0.77 0.44 - 1.00 mg/dL   Calcium 8.6 (L) 8.9 - 10.3 mg/dL   GFR, Estimated >60 >60 mL/min   Anion gap 8 5 - 15      The ASCVD Risk score (Arnett DK, et al., 2019) failed to calculate for the following reasons:   The patient has a prior MI or stroke diagnosis    Assessment & Plan:   #1 CAD with severe three-vessel disease and severe mitral valve regurgitation.  Status post CABG and mitral valve replacement 02/06/2022.  Currently stable.  She has had close follow-up with cardiology will need to be established with new cardiologist in [facility] soon.  Will presumably need to start cardiac rehab in [place] given their impending move  #2 systolic heart failure.   Patient intolerant of Farxiga.  Recent initiation of carvedilol.  Plans to look at additional medications soon if blood pressure tolerates.  She had hyperkalemia with Aldactone.  bmet earlier today stable  #3 atrial flutter which is paroxysmal.  Sinus rhythm today.  Amiodarone decreased earlier today to 100 mg daily.  On Coumadin per Coumadin clinic.  #4 hypothyroidism treated with low-dose levothyroxine.  No follow-ups on file.    Carolann Littler, MD

## 2022-03-20 ENCOUNTER — Other Ambulatory Visit: Payer: Self-pay | Admitting: Surgery

## 2022-03-20 DIAGNOSIS — Z951 Presence of aortocoronary bypass graft: Secondary | ICD-10-CM

## 2022-03-23 ENCOUNTER — Ambulatory Visit
Admission: RE | Admit: 2022-03-23 | Discharge: 2022-03-23 | Disposition: A | Discharge status: Home / Self Care | Payer: Medicare HMO | Source: Ambulatory Visit | Attending: Surgery | Admitting: Surgery

## 2022-03-23 ENCOUNTER — Ambulatory Visit (INDEPENDENT_AMBULATORY_CARE_PROVIDER_SITE_OTHER): Payer: Self-pay | Admitting: Physician Assistant

## 2022-03-23 VITALS — BP 133/77 | HR 98 | Resp 20 | Ht 64.0 in | Wt 127.0 lb

## 2022-03-23 DIAGNOSIS — Z951 Presence of aortocoronary bypass graft: Secondary | ICD-10-CM

## 2022-03-23 DIAGNOSIS — Z952 Presence of prosthetic heart valve: Secondary | ICD-10-CM

## 2022-03-23 MED ORDER — POTASSIUM CHLORIDE ER 10 MEQ PO TBCR: 10.0000 meq | EXTENDED_RELEASE_TABLET | Freq: Every day | ORAL | 0 refills | Status: DC

## 2022-03-23 MED ORDER — FUROSEMIDE 40 MG PO TABS: 40.0000 mg | ORAL_TABLET | Freq: Every day | ORAL | 0 refills | Status: DC

## 2022-03-23 NOTE — Progress Notes (Signed)
[address]       [place] [address]             [phone]       HPI: Patient returns for routine postoperative follow-up having undergone CABG x 4 and Mitral Valve Replacement on 02/06/2022.  The patient's early postoperative recovery while in the hospital was notable for inotropic support which was weaned as hemodynamics allowed.  She developed Atrial Flutter and was treated with IV Amiodarone.  Atrial pacing was attempted but was ultimately unsuccessful.  She was medically optimized by Advanced Heart Failure team.  Since hospital discharge the patient reports for the most part she is doing well.  She does note she gets tired more easily with the addition of her new medication.  She states she takes a small nap during the day which helps.  She is ambulating independently.  She denies pain and shortness of breath.  She states her weight has been stable.  Incisions are healing well.  Current Outpatient Medications  Medication Sig Dispense Refill   amiodarone (PACERONE) 200 MG tablet Take 0.5 tablets (100 mg total) by mouth daily. 90 tablet 1   aspirin EC 81 MG EC tablet Take 1 tablet (81 mg total) by mouth daily. Swallow whole. 30 tablet 11   betamethasone dipropionate 0.05 % cream Apply 1 application. topically 2 (two) times daily. Apply to right hand     bismuth subsalicylate (PEPTO BISMOL) 262 MG/15ML suspension Take 30 mLs by mouth every 6 (six) hours as needed for indigestion or diarrhea or loose stools.     carvedilol (COREG) 3.125 MG tablet Take 1 tablet (3.125 mg total) by mouth 2 (two) times daily with a meal. 180 tablet 3   fluticasone (FLONASE) 50 MCG/ACT nasal spray Place 1-2 sprays into both nostrils daily as needed for allergies or rhinitis.     levothyroxine (SYNTHROID) 25 MCG tablet TAKE 1 TABLET BY MOUTH EVERY DAY 90 tablet 3   Multiple Vitamin (MULTIVITAMIN WITH MINERALS) TABS tablet Take 1 tablet by mouth daily.     ondansetron (ZOFRAN ODT) 4 MG  disintegrating tablet Take 1 tablet (4 mg total) by mouth every 8 (eight) hours as needed for nausea or vomiting. 20 tablet 0   pantoprazole (PROTONIX) 40 MG tablet Take 1 tablet (40 mg total) by mouth daily. 30 tablet 1   rosuvastatin (CRESTOR) 5 MG tablet Take 1 tablet (5 mg total) by mouth daily. 30 tablet 1   traMADol (ULTRAM) 50 MG tablet Take 1 tablet (50 mg total) by mouth every 6 (six) hours as needed for severe pain. 20 tablet 0   trimethoprim (TRIMPEX) 100 MG tablet Take 1 tablet (100 mg total) by mouth daily.     warfarin (COUMADIN) 3 MG tablet Take 1 tablet (3 mg total) by mouth daily at 4 PM. Or as directed 30 tablet 1   No current facility-administered medications for this visit.    Physical Exam:  BP 133/77 (BP Location: Right Arm, Patient Position: Sitting)   Pulse 98   Resp 20   Ht '5\' 4"'$  (1.626 m)   Wt 127 lb (57.6 kg)   SpO2 97% Comment: RA  BMI 21.80 kg/m   Gen: NAD Heart:RRR Lungs: diminished right base Abd: soft non-tender, non-distended Ext: no edema Incisions: well healed  Diagnostic Tests:  CXR: small right pleural effusion, MV prosthesis present, sternal wires intact   A/P:  S/P CABG x 4/MV Replacement- post operative Echocardiogram w/o evidence  of MR... she overall is doing well, does have some fatigue and decrease in energy level with addition of Coreg, hopefully will improve.. if not as EF improves could do Toprol XL nightly Right Pleural effusion- this is small patient is asymptomatic and weight has been stable.. will give a 7 day course of Lasix, potassium.Marland Kitchen educated patient on signs and symptoms of this progressing Cardiac rehabilitation- okay to start if patient wishes, she is moving to [place] and it may be best to wait until she has settled Activity- increase ambulation as tolerated, okay to resume driving if off narcotic pain medication, cardiac rehabilitation if interested, Endocarditis prophylaxis provided Dispo- RTC prn, patient is moving  to [place] will need to establish Cardiology care once settled   Ellwood Handler, PA-C [facility] [phone]

## 2022-03-23 NOTE — Patient Instructions (Signed)
Endocarditis is a potentially serious infection of heart valves or inside lining of the heart.  It occurs more commonly in patients with diseased heart valves (such as patient's with aortic or mitral valve disease) and in patients who have undergone heart valve repair or replacement.  Certain surgical and dental procedures may put you at risk, such as dental cleaning, other dental procedures, or any surgery involving the respiratory, urinary, gastrointestinal tract, gallbladder or prostate gland.   To minimize your chances for develooping endocarditis, maintain good oral health and seek prompt medical attention for any infections involving the mouth, teeth, gums, skin or urinary tract.    Always notify your doctor or dentist about your underlying heart valve condition before having any invasive procedures. You will need to take antibiotics before certain procedures, including all routine dental cleanings or other dental procedures.  Your cardiologist or dentist should prescribe these antibiotics for you to be taken ahead of time.    You may continue to gradually increase your physical activity as tolerated.  Refrain from any heavy lifting or strenuous use of your arms and shoulders until at least 8 weeks from the time of your surgery, and avoid activities that cause increased pain in your chest on the side of your surgical incision.  Otherwise you may continue to increase activities without any particular limitations.  Increase the intensity and duration of physical activity gradually.    You are encouraged to enroll and participate in the outpatient cardiac rehab program beginning as soon as practical.   You may return to driving an automobile as long as you are no longer requiring oral narcotic pain relievers during the daytime.  It would be wise to start driving only short distances during the daylight and gradually increase from there as you feel comfortable.   Make every effort to maintain a  "heart-healthy" lifestyle with regular physical exercise and adherence to a low-fat, low-carbohydrate diet.  Continue to seek regular follow-up appointments with your primary care physician and/or cardiologist.

## 2022-03-23 NOTE — Progress Notes (Signed)
Advanced Heart Failure Clinic Note   Primary Care: [name], MD HF Cardiologist: Dr. Haroldine Laws  HPI:  Tonya Hamilton is a 76 y.o. with h/o HTN, hyperlipidemia, hypothyroidism, diverticulitis, arthritis, GERD, and chronic/frequent UTI, and new diagnosis of CAD and systolic heart failure.    Admitted 01/2022 with acute systolic heart failure. Echo EF 30-35%,  mild LV hypertrophy of basal septal segment, akinetic inferior and posterior wall, hypokinetic anterolateral wall and entire apex, grade 3 DD, elevated LA pressure, RV normal, moderately elevated PASP with RVSP 52mHg, mildly dilated LA, large pleural effusion in the left lateral region, severe MR, aortic sclerosis. Given IV Lasix and placed on BiPap due to hypoxia. Cardiology consulted and underwent R/LHC showing severe 3 vessel disease with occluded distal LCx-OM 3 with severe upstream disease as the culprit lesion but also significant disease in the LAD/diagonal system as well as RCA. RA 9 PAP 43/22 mean 32, PCWP 26, fick CO 3.2, CI 1.9> IABP placed in the lab. Placed milrinone + norepi + lasix gtts. Intubated in the lab. CT surgery consulted and underwent CABG + MVR on 02/06/22. She was extubated, IABP removed and drips weaned off. Had AFL but converted before initiating amio gtt. Limited echo post surgery showed EF 35-40%, RMA, RV okay, mean gradient 7 across mitral valve prosthesis, large density in LA cavity that likely represents artifact. GDMT titrated and she was discharged home, weight 126 lbs.   Post hospital follow up 03/2022, feeling well. [place] Midodrine stopped with plans to add back GDMT. Labs showed K 5.5 and spironolactone was stopped.    [facility]. Overall was feeling fine. Was doing well off midodrine. She reported walking up stairs and [place] the [place] without dyspnea. Denied palpitations, abnormal bleeding, CP,  dizziness, edema, or PND/Orthopnea. Appetite was ok. No fever or chills. Weight at home was 127 pounds. Reported taking all medications. No further yeast infections. Planning on re-locating to [place] on 04/16/22.   Today she returns to HF clinic for pharmacist medication titration. At last visit with [facility], carvedilol 3.125 mg BID was initiated and amiodarone was decreased to 100 mg daily. Overall she is feeling well today. States she is more energetic since last visit and has been sleeping better. Occasional dizziness if she stands up to fast. This only occurs a few times a week and lasts a few seconds. It is not bothersome. She does not check her BP at home. BP in clinic 152/80. No SOB/DOE. No issues going up stairs or up hills. Weight at home has been stable at 123-125 lbs. Was instructed to take Lasix 40 mg daily x7 days by TCTS for pleural effusion. This was then stopped after 2 days after she had a 5 lb weight loss overnight. She has not needed any Lasix since that time (03/27/22). No LEE, PND or orthopnea. Taking all medications as prescribed and tolerating all medications. Moving to [place] 04/16/22. Is concerned that they have not heard anything about the cardiology referral.    HF Medications: Carvedilol 3.125 mg BID  Has the patient been experiencing any side effects to the medications prescribed?  no  Does the patient have any problems obtaining medications due to transportation or finances?   No - [employer] Medicare  Understanding of regimen: good Understanding of indications: good Potential of compliance: good Patient understands to avoid NSAIDs. Patient understands to avoid decongestants.  Pertinent Lab Values: 03/18/22: Serum creatinine 0.77, BUN 10, Potassium 3.8, Sodium 141  Vital Signs: Weight: 125.8 lbs (last clinic weight: 129.3 lbs) Blood pressure: 152/80  Heart rate: 90   Assessment/Plan: 1. CAD - Cath 01/2022 with 3V disease, elevated wedge, and reduced  cardiac output. IABP placed in lab with Norepi/Milrinone.  - s/p CABG + MVR (02/06/22) - Would typically give Plavix post NSTEMI, but also on warfarin with recent MVR. - No s/s angina.  - Continue statin + ASA. - Cardiac rehab when cleared by CT surgery.   2. Chronic HFrEF, ICM   - Echo (01/2022): EF 30-35% RV ok. Severe MR. EF improved on post CABG TEE. - Limited echo (02/11/22): EF 35-40%, RV okay, large density in LA cavity likely artifact - Stable NYHA II. Volume status stable.  - Continue Lasix 20 mg PRN only - Continue carvedilol 3.125 mg BID. - Start Losartan 12.5 mg daily. History of angioedema in 2016 with lisinopril. No ACEI/ARNI. Repeat BMET next week.  - Did not tolerate Farxiga due to yeast infections - Off spironolactone with hyperkalemia. Consider re-challenge in future.   3. Severe MR  - S/p MV replacement with 25 mm Mitris Resilia pericardial valve - Warfarin per TCTS surgery (will be on for 3 months) - INR 2.4. She will need INR checks arranged in [place] through July. Messaged office staff about status of cardiology referral.    4. PVCs - None on ECG 03/18/22. - Continue amiodarone 100 mg daily.   5. Atrial Flutter, paroxysmal - NSR on ECG 03/18/22. - Continue amiodarone 100 mg daily. - On warfarin, INR per Coumadin Clinic.   6. Hypothyroidism  - On levothyroxine.    7. Anemia, ABLA - Hgb up to 11.1 on 03/03/22 - Stable.   8. B/l Pleural Effusions - lung u/s with IR 02/13/22 no significant effusion to tap. - Clear on exam today.    Follow up 1 week with [facility]. Obtain BMET at that time.    Audry Riles, PharmD, BCPS, BCCP, CPP Heart Failure Clinic Pharmacist [phone]

## 2022-03-27 ENCOUNTER — Ambulatory Visit: Payer: Medicare HMO

## 2022-03-27 ENCOUNTER — Telehealth: Payer: Self-pay | Admitting: *Deleted

## 2022-03-27 DIAGNOSIS — I4892 Unspecified atrial flutter: Secondary | ICD-10-CM

## 2022-03-27 DIAGNOSIS — Z7901 Long term (current) use of anticoagulants: Secondary | ICD-10-CM

## 2022-03-27 DIAGNOSIS — Z952 Presence of prosthetic heart valve: Secondary | ICD-10-CM

## 2022-03-27 LAB — POCT INR: INR: 1.4 — AB (ref 2.0–3.0)

## 2022-03-27 NOTE — Telephone Encounter (Signed)
Patient contacted the office stating she had a 5lb weight loss over night while on Lasix. Per patient she began taking the Lasix on Wednesday for Pleural Effusion. Patient had her INR checked this morning with a drop in value. Patient was told to contact our office. Patient advised per E. Barrett, PA to stop taking the Lasix and to continue Coumadin at current dose. Patient advised to weigh self daily and to restart taking the Lasix and Potassium for any 24 hour weight gain of 3-5lbs. Patient appreciative of instructions.

## 2022-03-27 NOTE — Patient Instructions (Signed)
TAKE 2 TABLETS TODAY ONLY and then increase to 1 tablet Daily, except 1.5 Monday, Wednesday and Friday.  INR in 2 weeks.  [phone]

## 2022-03-31 ENCOUNTER — Other Ambulatory Visit: Payer: Self-pay | Admitting: Physician Assistant

## 2022-04-01 ENCOUNTER — Ambulatory Visit (HOSPITAL_COMMUNITY)
Admission: RE | Admit: 2022-04-01 | Discharge: 2022-04-01 | Disposition: A | Discharge status: Home / Self Care | Payer: Medicare HMO | Source: Ambulatory Visit | Attending: Internal Medicine | Admitting: Internal Medicine

## 2022-04-01 VITALS — BP 152/80 | HR 90 | Wt 125.8 lb

## 2022-04-01 DIAGNOSIS — J90 Pleural effusion, not elsewhere classified: Secondary | ICD-10-CM | POA: Diagnosis not present

## 2022-04-01 DIAGNOSIS — M1990 Unspecified osteoarthritis, unspecified site: Secondary | ICD-10-CM | POA: Insufficient documentation

## 2022-04-01 DIAGNOSIS — K219 Gastro-esophageal reflux disease without esophagitis: Secondary | ICD-10-CM | POA: Diagnosis not present

## 2022-04-01 DIAGNOSIS — I5022 Chronic systolic (congestive) heart failure: Secondary | ICD-10-CM

## 2022-04-01 DIAGNOSIS — I4892 Unspecified atrial flutter: Secondary | ICD-10-CM | POA: Insufficient documentation

## 2022-04-01 DIAGNOSIS — I2510 Atherosclerotic heart disease of native coronary artery without angina pectoris: Secondary | ICD-10-CM | POA: Diagnosis not present

## 2022-04-01 DIAGNOSIS — Z79899 Other long term (current) drug therapy: Secondary | ICD-10-CM | POA: Diagnosis not present

## 2022-04-01 DIAGNOSIS — Z7982 Long term (current) use of aspirin: Secondary | ICD-10-CM | POA: Diagnosis not present

## 2022-04-01 DIAGNOSIS — Z7901 Long term (current) use of anticoagulants: Secondary | ICD-10-CM | POA: Insufficient documentation

## 2022-04-01 DIAGNOSIS — I493 Ventricular premature depolarization: Secondary | ICD-10-CM | POA: Insufficient documentation

## 2022-04-01 DIAGNOSIS — E785 Hyperlipidemia, unspecified: Secondary | ICD-10-CM | POA: Insufficient documentation

## 2022-04-01 DIAGNOSIS — D649 Anemia, unspecified: Secondary | ICD-10-CM | POA: Diagnosis not present

## 2022-04-01 DIAGNOSIS — I252 Old myocardial infarction: Secondary | ICD-10-CM | POA: Insufficient documentation

## 2022-04-01 DIAGNOSIS — Z951 Presence of aortocoronary bypass graft: Secondary | ICD-10-CM | POA: Diagnosis not present

## 2022-04-01 DIAGNOSIS — Z87440 Personal history of urinary (tract) infections: Secondary | ICD-10-CM | POA: Insufficient documentation

## 2022-04-01 DIAGNOSIS — Z79890 Hormone replacement therapy (postmenopausal): Secondary | ICD-10-CM | POA: Diagnosis not present

## 2022-04-01 DIAGNOSIS — E039 Hypothyroidism, unspecified: Secondary | ICD-10-CM | POA: Diagnosis not present

## 2022-04-01 DIAGNOSIS — I340 Nonrheumatic mitral (valve) insufficiency: Secondary | ICD-10-CM | POA: Diagnosis not present

## 2022-04-01 DIAGNOSIS — Z7902 Long term (current) use of antithrombotics/antiplatelets: Secondary | ICD-10-CM | POA: Diagnosis not present

## 2022-04-01 DIAGNOSIS — I110 Hypertensive heart disease with heart failure: Secondary | ICD-10-CM | POA: Diagnosis not present

## 2022-04-01 DIAGNOSIS — K5792 Diverticulitis of intestine, part unspecified, without perforation or abscess without bleeding: Secondary | ICD-10-CM | POA: Diagnosis not present

## 2022-04-01 MED ORDER — LOSARTAN POTASSIUM 25 MG PO TABS: 12.5000 mg | ORAL_TABLET | Freq: Every day | ORAL | 1 refills | Status: AC

## 2022-04-01 MED ORDER — FUROSEMIDE 20 MG PO TABS: 20.0000 mg | ORAL_TABLET | Freq: Every day | ORAL | 1 refills | Status: DC | PRN

## 2022-04-01 NOTE — Patient Instructions (Signed)
It was a pleasure seeing you today!  MEDICATIONS: -We are changing your medications today -Start losartan 12.5 mg (1/2 tablet) daily.  -Call if you have questions about your medications.   NEXT APPOINTMENT: Return to clinic in 1 week with [facility].  In general, to take care of your heart failure: -Limit your fluid intake to 2 Liters (half-gallon) per day.   -Limit your salt intake to ideally 2-3 grams (2000-3000 mg) per day. -Weigh yourself daily and record, and bring that "weight diary" to your next appointment.  (Weight gain of 2-3 pounds in 1 day typically means fluid weight.) -The medications for your heart are to help your heart and help you live longer.   -Please contact us before stopping any of your heart medications.  Call the clinic at [phone] with questions or to reschedule future appointments.

## 2022-04-06 NOTE — Progress Notes (Incomplete)
ADVANCED HF CLINIC NOTE   Primary Care: Tonya Post, MD HF Cardiologist: Dr. Haroldine Hamilton  HPI: [name] is a 76 y.o. with h/o HTN, hyperlipidemia, hypothyroidism, diverticulitis, arthritis, GERD, and chronic/frequent UTI, and new diagnosis of CAD and systolic heart failure.    Admitted 4/23 with acute systolic heart failure. Echo EF 30-35%,  mild LV hypertrophy of basal septal segment, akinetic inferior and posterior wall, hypokinetic anterolateral wall and entire apex, grade 3 DD, elevated LA pressure, RV normal, moderately elevated PASP with RVSP 30mHg, mildly dilated LA, large pleural effusion in the left lateral region, severe MR, aortic sclerosis. Given IV lasix and placed on BiPap due to hypoxia. Cardiology consulted and underwent R/LHC showing severe 3 vessel disease with occluded distal LCx-OM 3 with severe upstream disease as the culprit lesion but also significant disease in the LAD/diagonal system as well as RCA. RA 9 PAP 43/22 mean 32, PCWP 26, fick CO 3.2, CI 1.9> IABP placed in the lab. Placed milrinone + norepi + lasix gtts. Intubated in the lab. CT surgery consulted and underwent CABG + MVR on 02/06/22. She was extubated, IABP removed and drips weaned off. Had AFL but converted before initiating amio gtt. Limited echo Hamilton surgery showed EF 35-40%, RMA, RV okay, mean gradient 7 across mitral valve prosthesis, large density in LA cavity that likely represents artifact. GDMT titrated and she was discharged home, weight 126 lbs.  [facility] hospital follow up 5/23, feeling well. [place] Midodrine stopped with plans to add back GDMT. Labs showed K 5.5 and spiro stopped.   Today she returns for HF follow up with her husband. Overall feeling fine. She is walking up stairs and [place] dyspnea. Denies palpitations, abnormal bleeding, CP, dizziness, edema, or PND/Orthopnea. Appetite ok. No fever or chills. Weight at home  127 pounds. Taking all medications. No further yeast infections. Planning on re-locating to [place], [place] on 04/16/22.   Cardiac Studies: - Echo (4/23): EF 30-35%,  mild LV hypertrophy of basal septal segment, akinetic inferior and posterior wall, hypokinetic anterolateral wall and entire apex, grade 3 DD, elevated LA pressure, RV normal, moderately elevated PASP with RVSP 573mg, mildly dilated LA, large pleural effusion in the left lateral region, severe MR, aortic sclerosis.   - L/RHC (4/23): EF 30-35%, severe multi vessel CAD with occluded distal LCx-OM 3 with severe upstream disease as the culprit lesion but also significant disease in the LAD/diagonal system as well as RCA.  RAP mean 9 mmHg, RVP 47/6-10 mmHg; PAP 43/22 mmHg with a mean of 32 mmHg.  PCWP mean 26 mmHg with a V wave of 33 mmHg suggestive of MR. LVEDP-EDP 110/10 mmHg - 28 mmHg; ALP-MAP 113/66 mmHg-90 million mercury-this was prior to balloon pump and pressors being placed after intubation.  - Ltd echo (Hamilton surgery): EF 35-40%, RMA, RV okay, mean gradient 7 across mitral valve prosthesis, large density in LA cavity that likely represents artifact  Past Medical History:  Diagnosis Date   Acute respiratory failure ([place]/01/2022   Arthritis    Cardiogenic shock (HCC)    Colon polyps    Community acquired pneumonia    Diverticulitis    Hyperlipidemia    Hypertension    Hypothyroidism    PONV (postoperative nausea and vomiting)    Thyroid disease    UTI (urinary tract infection)    Current Outpatient Medications  Medication Sig Dispense Refill   amiodarone (PACERONE) 200 MG  tablet Take 0.5 tablets (100 mg total) by mouth daily. 90 tablet 1   aspirin EC 81 MG EC tablet Take 1 tablet (81 mg total) by mouth daily. Swallow whole. 30 tablet 11   betamethasone dipropionate 0.05 % cream Apply 1 application. topically 2 (two) times daily. Apply to right hand     bismuth subsalicylate (PEPTO BISMOL) 262 MG/15ML  suspension Take 30 mLs by mouth every 6 (six) hours as needed for indigestion or diarrhea or loose stools.     carvedilol (COREG) 3.125 MG tablet Take 1 tablet (3.125 mg total) by mouth 2 (two) times daily with a meal. 180 tablet 3   fluticasone (FLONASE) 50 MCG/ACT nasal spray Place 1-2 sprays into both nostrils daily as needed for allergies or rhinitis.     furosemide (LASIX) 20 MG tablet Take 1 tablet (20 mg total) by mouth daily as needed for fluid. 90 tablet 1   levothyroxine (SYNTHROID) 25 MCG tablet TAKE 1 TABLET BY MOUTH EVERY DAY 90 tablet 3   losartan (COZAAR) 25 MG tablet Take 0.5 tablets (12.5 mg total) by mouth daily. 45 tablet 1   Multiple Vitamin (MULTIVITAMIN WITH MINERALS) TABS tablet Take 1 tablet by mouth daily.     ondansetron (ZOFRAN ODT) 4 MG disintegrating tablet Take 1 tablet (4 mg total) by mouth every 8 (eight) hours as needed for nausea or vomiting. 20 tablet 0   pantoprazole (PROTONIX) 40 MG tablet Take 1 tablet (40 mg total) by mouth daily. 30 tablet 1   rosuvastatin (CRESTOR) 5 MG tablet Take 1 tablet (5 mg total) by mouth daily. 30 tablet 1   traMADol (ULTRAM) 50 MG tablet Take 1 tablet (50 mg total) by mouth every 6 (six) hours as needed for severe pain. 20 tablet 0   trimethoprim (TRIMPEX) 100 MG tablet Take 1 tablet (100 mg total) by mouth daily.     warfarin (COUMADIN) 3 MG tablet Take 1 tablet (3 mg total) by mouth daily at 4 PM. Or as directed 30 tablet 1   No current facility-administered medications for this visit.   Allergies  Allergen Reactions   Botox [Onabotulinumtoxina]     Was given too much botox and at night, felt like her throat was closing up.    Diclofenac Other (See Comments)    Unknown reaction per pt and SO   Lisinopril Swelling    Face/chin swelling   Simvastatin Other (See Comments)    myalgias   Statins Other (See Comments)    myalgias   Sulfa Antibiotics Itching   Social History   Socioeconomic History   Marital status: Married     Spouse name: Tonya Hamilton    Number of children: 2   Years of education: 13   Highest education level: Not on file  Occupational History   Not on file  Tobacco Use   Smoking status: Never   Smokeless tobacco: Never  Vaping Use   Vaping Use: Never used  Substance and Sexual Activity   Alcohol use: No   Drug use: No   Sexual activity: Not Currently  Other Topics Concern   Not on file  Social History Narrative   2 cups coffee per day   Soda daily (1/day)   Lives with husband   Right handed   Social Determinants of Health   Financial Resource Strain: Low Risk    Difficulty of Paying Living Expenses: Not hard at all  Food Insecurity: No Food Insecurity   Worried About Running Out of  Food in the Last Year: Never true   [place] in the Last Year: Never true  Transportation Needs: No Transportation Needs   Lack of Transportation (Medical): No   Lack of Transportation (Non-Medical): No  Physical Activity: Insufficiently Active   Days of Exercise per Week: 5 days   Minutes of Exercise per Session: 10 min  Stress: No Stress Concern Present   Feeling of Stress : Not at all  Social Connections: Socially Integrated   Frequency of Communication with Friends and Family: Twice a week   Frequency of Social Gatherings with Friends and Family: Twice a week   Attends Religious Services: More than 4 times per year   Active Member of [employer] or Organizations: Yes   Attends Music therapist: More than 4 times per year   Marital Status: Married  Human resources officer Violence: Not At Risk   Fear of Current or Ex-Partner: No   Emotionally Abused: No   Physically Abused: No   Sexually Abused: No   Family History  Problem Relation Age of Onset   Arthritis Mother    Hypertension Mother    Heart disease Mother    Hypertension Father    Stroke Father    Heart disease Father    Heart Problems Maternal Grandfather    Breast cancer Neg Hx    There were no vitals taken for this  visit.  Wt Readings from Last 3 Encounters:  04/01/22 57.1 kg (125 lb 12.8 oz)  03/23/22 57.6 kg (127 lb)  03/18/22 58.2 kg (128 lb 6.4 oz)   PHYSICAL EXAM: General:  NAD. No resp difficulty HEENT: Normal Neck: Supple. No JVD. Carotids 2+ bilat; no bruits. No lymphadenopathy or thryomegaly appreciated. Cor: PMI nondisplaced. Regular rate & rhythm. No rubs, gallops or murmurs. Lungs: Clear Abdomen: Soft, nontender, nondistended. No hepatosplenomegaly. No bruits or masses. Good bowel sounds. Extremities: No cyanosis, clubbing, rash, edema Neuro: Alert & oriented x 3, cranial nerves grossly intact. Moves all 4 extremities w/o difficulty. Affect pleasant. Mild head tremor.  ECG: NSR 1AVB PR 216 msec, QTc 426 msec, 98 bpm (personally reviewed)  ASSESSMENT & PLAN: 1. CAD - Cath 4/23 with 3V disease, elevated wedge, and reduced cardiac output. IABP placed in lab with Norepi/Milrinone.  - s/p CABG + MVR (02/06/22) - Would typically give Plavix Hamilton NSTEMI, but also on warfarin with recent MVR. - No s/s angina.  - Continue statin + ASA. - Cardiac rehab when cleared by CT surgery.   2. Chronic HFrEF, ICM   - Echo (4/23): EF 30-35% RV ok. Severe MR. EF improved on Hamilton CABG TEE. - Limited echo (02/11/22): EF 35-40%, RV okay, large density in LA cavity likely artifact - Stable NYHA II. Volume status stable. She does not need loop diuretics.  - Start carvedilol 3.125 mg bid. - Continue Farxiga 10 mg daily. Had recent yeast infection, tx with fluconazole. Would stop if recurs. - Add ARB/ARNi next. - Off spiro with hyperkalemia. Consider re-challenge in future. - I asked her to continue to weight daily. - BMET today.   3. Severe MR  - S/p MV replacement with 25 mm Mitris Resilia pericardial valve - Warfarin per TCTS surgery (will be on for 3 months) - INR 2.4. She will need INR checks arranged in [place] through July.   4. PVCs - None on ECG today. - Decrease amiodarone to 100 mg daily.  5.  Atrial Flutter, paroxysmal - NSR on ECG today. - Decrease amiodarone to  100 mg daily as above. - On warfarin, INR per [facility]. - No bleeding issues.   6. Hypothyroidism  - On levothyroxine.    7. Anemia, ABLA - Hgb up to 11.1 on 03/03/22 - Stable.   8. B/l Pleural Effusions - lung u/s with IR 4/14 no significant effusion to tap. - Clear on exam today.  I have called [facility] in [facility], [place] to discuss referral (awaiting call back). Will require coordination of INR monitoring, she has CT surgery follow up next week. Discussed with Dr. Haroldine Hamilton.  Follow up with PharmD in 1-2 weeks (add ARB or ARNi if able). Will make sure she had 90-day supply for her HF meds.  Tonya Katz, FNP-BC 04/06/22

## 2022-04-07 ENCOUNTER — Other Ambulatory Visit: Payer: Self-pay | Admitting: Physician Assistant

## 2022-04-07 ENCOUNTER — Ambulatory Visit (HOSPITAL_COMMUNITY)
Admission: RE | Admit: 2022-04-07 | Discharge: 2022-04-07 | Disposition: A | Discharge status: Home / Self Care | Payer: Medicare HMO | Source: Ambulatory Visit | Attending: Family Medicine | Admitting: Family Medicine

## 2022-04-07 ENCOUNTER — Encounter (HOSPITAL_COMMUNITY): Payer: Self-pay

## 2022-04-07 VITALS — BP 116/64 | HR 83 | Wt 129.4 lb

## 2022-04-07 DIAGNOSIS — Z87440 Personal history of urinary (tract) infections: Secondary | ICD-10-CM | POA: Insufficient documentation

## 2022-04-07 DIAGNOSIS — I493 Ventricular premature depolarization: Secondary | ICD-10-CM | POA: Diagnosis not present

## 2022-04-07 DIAGNOSIS — I2510 Atherosclerotic heart disease of native coronary artery without angina pectoris: Secondary | ICD-10-CM | POA: Diagnosis present

## 2022-04-07 DIAGNOSIS — E039 Hypothyroidism, unspecified: Secondary | ICD-10-CM | POA: Diagnosis not present

## 2022-04-07 DIAGNOSIS — I252 Old myocardial infarction: Secondary | ICD-10-CM | POA: Diagnosis not present

## 2022-04-07 DIAGNOSIS — Z952 Presence of prosthetic heart valve: Secondary | ICD-10-CM

## 2022-04-07 DIAGNOSIS — J90 Pleural effusion, not elsewhere classified: Secondary | ICD-10-CM | POA: Diagnosis not present

## 2022-04-07 DIAGNOSIS — M1990 Unspecified osteoarthritis, unspecified site: Secondary | ICD-10-CM | POA: Diagnosis not present

## 2022-04-07 DIAGNOSIS — E785 Hyperlipidemia, unspecified: Secondary | ICD-10-CM | POA: Insufficient documentation

## 2022-04-07 DIAGNOSIS — I4892 Unspecified atrial flutter: Secondary | ICD-10-CM | POA: Diagnosis not present

## 2022-04-07 DIAGNOSIS — K219 Gastro-esophageal reflux disease without esophagitis: Secondary | ICD-10-CM | POA: Diagnosis not present

## 2022-04-07 DIAGNOSIS — Z951 Presence of aortocoronary bypass graft: Secondary | ICD-10-CM | POA: Insufficient documentation

## 2022-04-07 DIAGNOSIS — Z79899 Other long term (current) drug therapy: Secondary | ICD-10-CM | POA: Diagnosis not present

## 2022-04-07 DIAGNOSIS — I5022 Chronic systolic (congestive) heart failure: Secondary | ICD-10-CM | POA: Diagnosis not present

## 2022-04-07 DIAGNOSIS — E875 Hyperkalemia: Secondary | ICD-10-CM | POA: Insufficient documentation

## 2022-04-07 DIAGNOSIS — I110 Hypertensive heart disease with heart failure: Secondary | ICD-10-CM | POA: Insufficient documentation

## 2022-04-07 LAB — BASIC METABOLIC PANEL
Anion gap: 9 (ref 5–15)
BUN: 14 mg/dL (ref 8–23)
CO2: 26 mmol/L (ref 22–32)
Calcium: 8.5 mg/dL — ABNORMAL LOW (ref 8.9–10.3)
Chloride: 108 mmol/L (ref 98–111)
Creatinine, Ser: 0.79 mg/dL (ref 0.44–1.00)
GFR, Estimated: >60 mL/min (ref >60)
Glucose, Bld: 114 mg/dL — ABNORMAL HIGH (ref 70–99)
Potassium: 3.8 mmol/L (ref 3.5–5.1)
Sodium: 143 mmol/L (ref 135–145)

## 2022-04-07 NOTE — Addendum Note (Signed)
Encounter addended by: Rafael Bihari, FNP on: 04/07/2022 4:11 PM  Actions taken: Clinical Note Signed

## 2022-04-07 NOTE — Patient Instructions (Addendum)
Thank you for coming in today  Labs were done today, if any labs are abnormal the clinic will call you No news is good news  STOP Amiodarone  At the [facility], you and your health needs are our priority. As part of our continuing mission to provide you with exceptional heart care, we have created designated Provider Care Teams. These Care Teams include your primary Cardiologist (physician) and Advanced Practice Providers (APPs- Physician Assistants and Nurse Practitioners) who all work together to provide you with the care you need, when you need it.   You may see any of the following providers on your designated Care Team at your next follow up: Dr Glori Bickers Dr Haynes Kerns, NP Lyda Jester, [place] [facility] [place], [place] Audry Riles, PharmD   Please be sure to bring in all your medications bottles to every appointment.   If you have any questions or concerns before your next appointment please send [place] a message through [place] or call our office at [phone].    TO LEAVE A MESSAGE FOR THE NURSE SELECT OPTION 2, PLEASE LEAVE A MESSAGE INCLUDING: YOUR NAME DATE OF BIRTH CALL BACK NUMBER REASON FOR CALL**this is important as we prioritize the call backs  YOU WILL RECEIVE A CALL BACK THE SAME DAY AS LONG AS YOU CALL BEFORE 4:00 PM

## 2022-04-08 ENCOUNTER — Other Ambulatory Visit: Payer: Self-pay | Admitting: Physician Assistant

## 2022-04-08 ENCOUNTER — Other Ambulatory Visit (HOSPITAL_COMMUNITY): Payer: Self-pay

## 2022-04-08 ENCOUNTER — Other Ambulatory Visit: Payer: Self-pay | Admitting: Family Medicine

## 2022-04-08 ENCOUNTER — Other Ambulatory Visit: Payer: Self-pay

## 2022-04-08 DIAGNOSIS — I4892 Unspecified atrial flutter: Secondary | ICD-10-CM

## 2022-04-08 MED ORDER — TRIMETHOPRIM 100 MG PO TABS
100.0000 mg | ORAL_TABLET | Freq: Every day | ORAL | 0 refills | Status: AC
Start: 2022-04-08 — End: ?
  Filled 2022-04-08: qty 90, 90d supply, fill #0

## 2022-04-08 MED ORDER — ROSUVASTATIN CALCIUM 5 MG PO TABS: 5.0000 mg | ORAL_TABLET | Freq: Every day | ORAL | 1 refills | Status: AC

## 2022-04-08 MED ORDER — WARFARIN SODIUM 3 MG PO TABS: ORAL_TABLET | ORAL | 0 refills | Status: DC

## 2022-04-08 NOTE — Telephone Encounter (Signed)
Prescription refill request received for warfarin Lov: 04/07/22 Sherri Sear, NP)  Next INR check: 04/10/22 Warfarin tablet strength: '3mg'$   Appropriate dose and refill sent to requested pharmacy

## 2022-04-09 ENCOUNTER — Other Ambulatory Visit: Payer: Self-pay

## 2022-04-10 ENCOUNTER — Other Ambulatory Visit: Payer: Self-pay

## 2022-04-10 ENCOUNTER — Ambulatory Visit (INDEPENDENT_AMBULATORY_CARE_PROVIDER_SITE_OTHER): Payer: Medicare HMO

## 2022-04-10 DIAGNOSIS — I4892 Unspecified atrial flutter: Secondary | ICD-10-CM

## 2022-04-10 DIAGNOSIS — Z952 Presence of prosthetic heart valve: Secondary | ICD-10-CM

## 2022-04-10 DIAGNOSIS — Z7901 Long term (current) use of anticoagulants: Secondary | ICD-10-CM

## 2022-04-10 LAB — POCT INR: INR: 1.6 — AB (ref 2.0–3.0)

## 2022-04-10 MED ORDER — WARFARIN SODIUM 3 MG PO TABS: ORAL_TABLET | ORAL | 0 refills | Status: AC

## 2022-04-10 NOTE — Patient Instructions (Signed)
Description   TAKE 2 TABLETS TODAY ONLY and then START taking 1.5 tablet daily, except 1 tablet on Mondays and Thursdays.  Recheck INR in 1 week. Coumadin Clinic: [phone]

## 2022-04-10 NOTE — Telephone Encounter (Signed)
Prescription refill request received for warfarin Lov: 04/07/22 ([place], NP) Next INR check: 04/16/22 Warfarin tablet strength: '3mg'$   Appropriate dose and refill sent to requested pharmacy.

## 2022-04-13 ENCOUNTER — Telehealth (HOSPITAL_COMMUNITY): Payer: Self-pay

## 2022-04-13 NOTE — Telephone Encounter (Signed)
Called pt to see if she was still moving to [place] and to make sure she didn't want to participate in cardiac rehab here at [facility]. Pt stated that she is moving this week to [place], [place] and that she wanted to make sure her cardiology referral was sent to [facility] in [place], [place]. I called [facility] in [place] and spoke to [facility] and she stated that, they didn't have pt referral yet. I took down the fax number and called the HF clinic and left a message with the front staff to advised Janett Billow FNP to please place pt referral to the [facility] in [place], [place] and provided fax number [phone].

## 2022-04-14 ENCOUNTER — Other Ambulatory Visit: Payer: Self-pay

## 2022-04-15 ENCOUNTER — Other Ambulatory Visit: Payer: Self-pay

## 2022-04-16 ENCOUNTER — Ambulatory Visit: Payer: Medicare HMO

## 2022-04-16 DIAGNOSIS — Z7901 Long term (current) use of anticoagulants: Secondary | ICD-10-CM | POA: Diagnosis not present

## 2022-04-16 DIAGNOSIS — I4892 Unspecified atrial flutter: Secondary | ICD-10-CM

## 2022-04-16 DIAGNOSIS — Z952 Presence of prosthetic heart valve: Secondary | ICD-10-CM | POA: Diagnosis not present

## 2022-04-16 LAB — POCT INR: INR: 1.5 — AB (ref 2.0–3.0)

## 2022-04-16 NOTE — Patient Instructions (Addendum)
Description   Take 2 tablets today and then START taking 1.5 tablet daily. Recheck INR in 10 days at lab in [facility]: [phone]

## 2022-04-17 ENCOUNTER — Other Ambulatory Visit: Payer: Self-pay | Admitting: Family Medicine

## 2022-04-27 ENCOUNTER — Other Ambulatory Visit: Payer: Self-pay | Admitting: Internal Medicine

## 2022-04-28 ENCOUNTER — Ambulatory Visit (INDEPENDENT_AMBULATORY_CARE_PROVIDER_SITE_OTHER): Payer: Medicare HMO | Admitting: Cardiology

## 2022-04-28 DIAGNOSIS — Z7901 Long term (current) use of anticoagulants: Secondary | ICD-10-CM

## 2022-04-28 DIAGNOSIS — Z952 Presence of prosthetic heart valve: Secondary | ICD-10-CM

## 2022-04-28 DIAGNOSIS — I4892 Unspecified atrial flutter: Secondary | ICD-10-CM

## 2022-04-28 LAB — PT AND PTT
INR: 1.3 — ABNORMAL HIGH (ref 0.9–1.2)
Prothrombin Time: 13.5 s — ABNORMAL HIGH (ref 9.1–12.0)
aPTT: 32 s (ref 24–33)

## 2022-05-08 ENCOUNTER — Other Ambulatory Visit: Payer: Self-pay | Admitting: Internal Medicine

## 2022-05-08 ENCOUNTER — Telehealth (HOSPITAL_COMMUNITY): Payer: Self-pay | Admitting: *Deleted

## 2022-05-08 ENCOUNTER — Telehealth: Payer: Self-pay | Admitting: *Deleted

## 2022-05-08 NOTE — Telephone Encounter (Addendum)
Received a voicemail from the pt's husband to call back in reference to an INR order to have pt lab drawn today. Pt stressed the importance and frustration over the voicemail. Returned a call to the pt and the husband advised that they had spoken to [place] at [facility] and she was assisting them. He apologized for the message and was glad I returned the call. Advised that I will be on the lookout for the results and call if received today. Also, he stated that he referral for a Cardiologist in [facility] has not done, advised I see where Allena Katz did call [facility] in reference to this per her note.   Pt did states she has a copy of the lab order and it states weekly or as needed-it will be valid for 6 months per protocol.   [place] to check on lab results and at 445pm results not resulted at this time. Also, called the pt and husband to update them that the results are not back and that we will update them on Monday; they verbalized  understanding and was thankful for assistance.   05/11/22 spoke with pt and discussed warfarin dose and next lab draw. She was thankful for the assistance and will call back if needed sooner.

## 2022-05-08 NOTE — Telephone Encounter (Signed)
Husband called from [facility] in [place], [place]. He reports he and his wife moved there after being seen here in [facility] and post cardiac surgery per Dr. Cyndia Bent.  Pt reports he need Rx for PT/INR for [facility] (and they are closing now). He says Mrs. Walkers' INR is being managed by Frederik Schmidt, NP at [facility] and he is unable to reach that office, "it is closed". Order sent for weekly or as needed PT/INR to [facility] in [place], [place]:   Phone: [phone]   Fax: [place] re: above.  Patient also asked for help with referral to local Cardiologist. Spoke with Allena Katz, NP who has called and left message with [facility]. Per Janett Billow, Dr. Haroldine Laws has also reached out to cardiology there as well.  I called and spoke with [facility], no record of patient. Referral packet faxed.   I called and updated Mr. Holleran re: above. Contact information shared re: [facility].  Zada Girt RN [phone]

## 2022-05-09 LAB — PROTIME-INR
INR: 1.7 — ABNORMAL HIGH (ref 0.9–1.2)
Prothrombin Time: 16.8 s — ABNORMAL HIGH (ref 9.1–12.0)

## 2022-05-11 ENCOUNTER — Ambulatory Visit (INDEPENDENT_AMBULATORY_CARE_PROVIDER_SITE_OTHER): Payer: Medicare HMO | Admitting: *Deleted

## 2022-05-11 DIAGNOSIS — I4892 Unspecified atrial flutter: Secondary | ICD-10-CM

## 2022-05-11 DIAGNOSIS — Z952 Presence of prosthetic heart valve: Secondary | ICD-10-CM | POA: Diagnosis not present

## 2022-05-11 DIAGNOSIS — Z7901 Long term (current) use of anticoagulants: Secondary | ICD-10-CM

## 2022-05-11 NOTE — Patient Instructions (Addendum)
Description   Spoke with wife and instructed pt to take 2.5 tablets today and then INCREASE TO 2 tablets daily, EXCEPT 1.5 tablets on Sunday, Tuesday, and Thursday. Resume at least one serving of leafy vegetable a week. Recheck INR in [facility] in [place], [place]. Coumadin Clinic: [phone]

## 2022-05-18 ENCOUNTER — Other Ambulatory Visit: Payer: Self-pay | Admitting: Internal Medicine

## 2022-05-18 LAB — PROTIME-INR: INR: 2.8 — AB (ref <=1.20)

## 2022-05-19 ENCOUNTER — Ambulatory Visit (INDEPENDENT_AMBULATORY_CARE_PROVIDER_SITE_OTHER): Payer: Medicare HMO | Admitting: *Deleted

## 2022-05-19 DIAGNOSIS — Z7901 Long term (current) use of anticoagulants: Secondary | ICD-10-CM | POA: Diagnosis not present

## 2022-05-19 DIAGNOSIS — I4892 Unspecified atrial flutter: Secondary | ICD-10-CM | POA: Diagnosis not present

## 2022-05-19 DIAGNOSIS — Z952 Presence of prosthetic heart valve: Secondary | ICD-10-CM

## 2022-05-19 LAB — PROTIME-INR
INR: 2.8 — ABNORMAL HIGH (ref 0.9–1.2)
Prothrombin Time: 27.8 s — ABNORMAL HIGH (ref 9.1–12.0)

## 2022-05-19 NOTE — Patient Instructions (Addendum)
Description   Spoke with pt and instructed pt to continue taking Warfarin 2 tablets daily, EXCEPT 1.5 tablets on Sunday, Tuesday, and Thursday. Resume at least one serving of leafy vegetable a week. Recheck INR in 2 [facility] in [place], [place]. Coumadin Clinic: [phone]

## 2022-06-01 ENCOUNTER — Other Ambulatory Visit: Payer: Self-pay | Admitting: Internal Medicine

## 2022-06-01 LAB — PROTIME-INR: INR: 2.5 — AB (ref <=1.20)

## 2022-06-02 ENCOUNTER — Ambulatory Visit (INDEPENDENT_AMBULATORY_CARE_PROVIDER_SITE_OTHER): Payer: Medicare HMO | Admitting: *Deleted

## 2022-06-02 DIAGNOSIS — Z952 Presence of prosthetic heart valve: Secondary | ICD-10-CM | POA: Diagnosis not present

## 2022-06-02 DIAGNOSIS — I4892 Unspecified atrial flutter: Secondary | ICD-10-CM | POA: Diagnosis not present

## 2022-06-02 DIAGNOSIS — Z7901 Long term (current) use of anticoagulants: Secondary | ICD-10-CM | POA: Diagnosis not present

## 2022-06-02 LAB — PROTIME-INR
INR: 2.5 — ABNORMAL HIGH (ref 0.9–1.2)
Prothrombin Time: 24.7 s — ABNORMAL HIGH (ref 9.1–12.0)

## 2022-06-05 ENCOUNTER — Telehealth: Payer: Self-pay

## 2022-06-05 NOTE — Telephone Encounter (Signed)
Received call from patient informing us that her new Cardiologist in [place] is discontinuing Warfarin and starting her on Eliquis.

## 2022-06-08 ENCOUNTER — Other Ambulatory Visit (HOSPITAL_COMMUNITY): Payer: Self-pay | Admitting: Family Medicine

## 2022-06-30 ENCOUNTER — Other Ambulatory Visit: Payer: Self-pay | Admitting: Family Medicine

## 2022-06-30 DIAGNOSIS — I4892 Unspecified atrial flutter: Secondary | ICD-10-CM

## 2022-07-27 ENCOUNTER — Telehealth: Payer: Self-pay | Admitting: Family Medicine

## 2022-07-27 NOTE — Telephone Encounter (Signed)
Left message for patient to call back and schedule Medicare Annual Wellness Visit (AWV) either virtually or in office. Left  my Herbie Drape number [phone]   Last AWV ;08/12/21  please schedule at anytime with [facility] Nurse Health Advisor 1 or 2

## 2022-07-27 NOTE — Telephone Encounter (Signed)
Patient returned my call.  She stated she moved to [place]  in June

## 2022-10-11 ENCOUNTER — Other Ambulatory Visit: Payer: Self-pay | Admitting: Internal Medicine

## 2023-04-08 IMAGING — DX DG ABDOMEN 1V
1 series · 1 of 1 positions shown · non-contrast
Comparison: CT abdomen and pelvis 06/10/2020.

CLINICAL DATA: OG tube.

EXAM:
ABDOMEN - 1 VIEW

[abdomen]
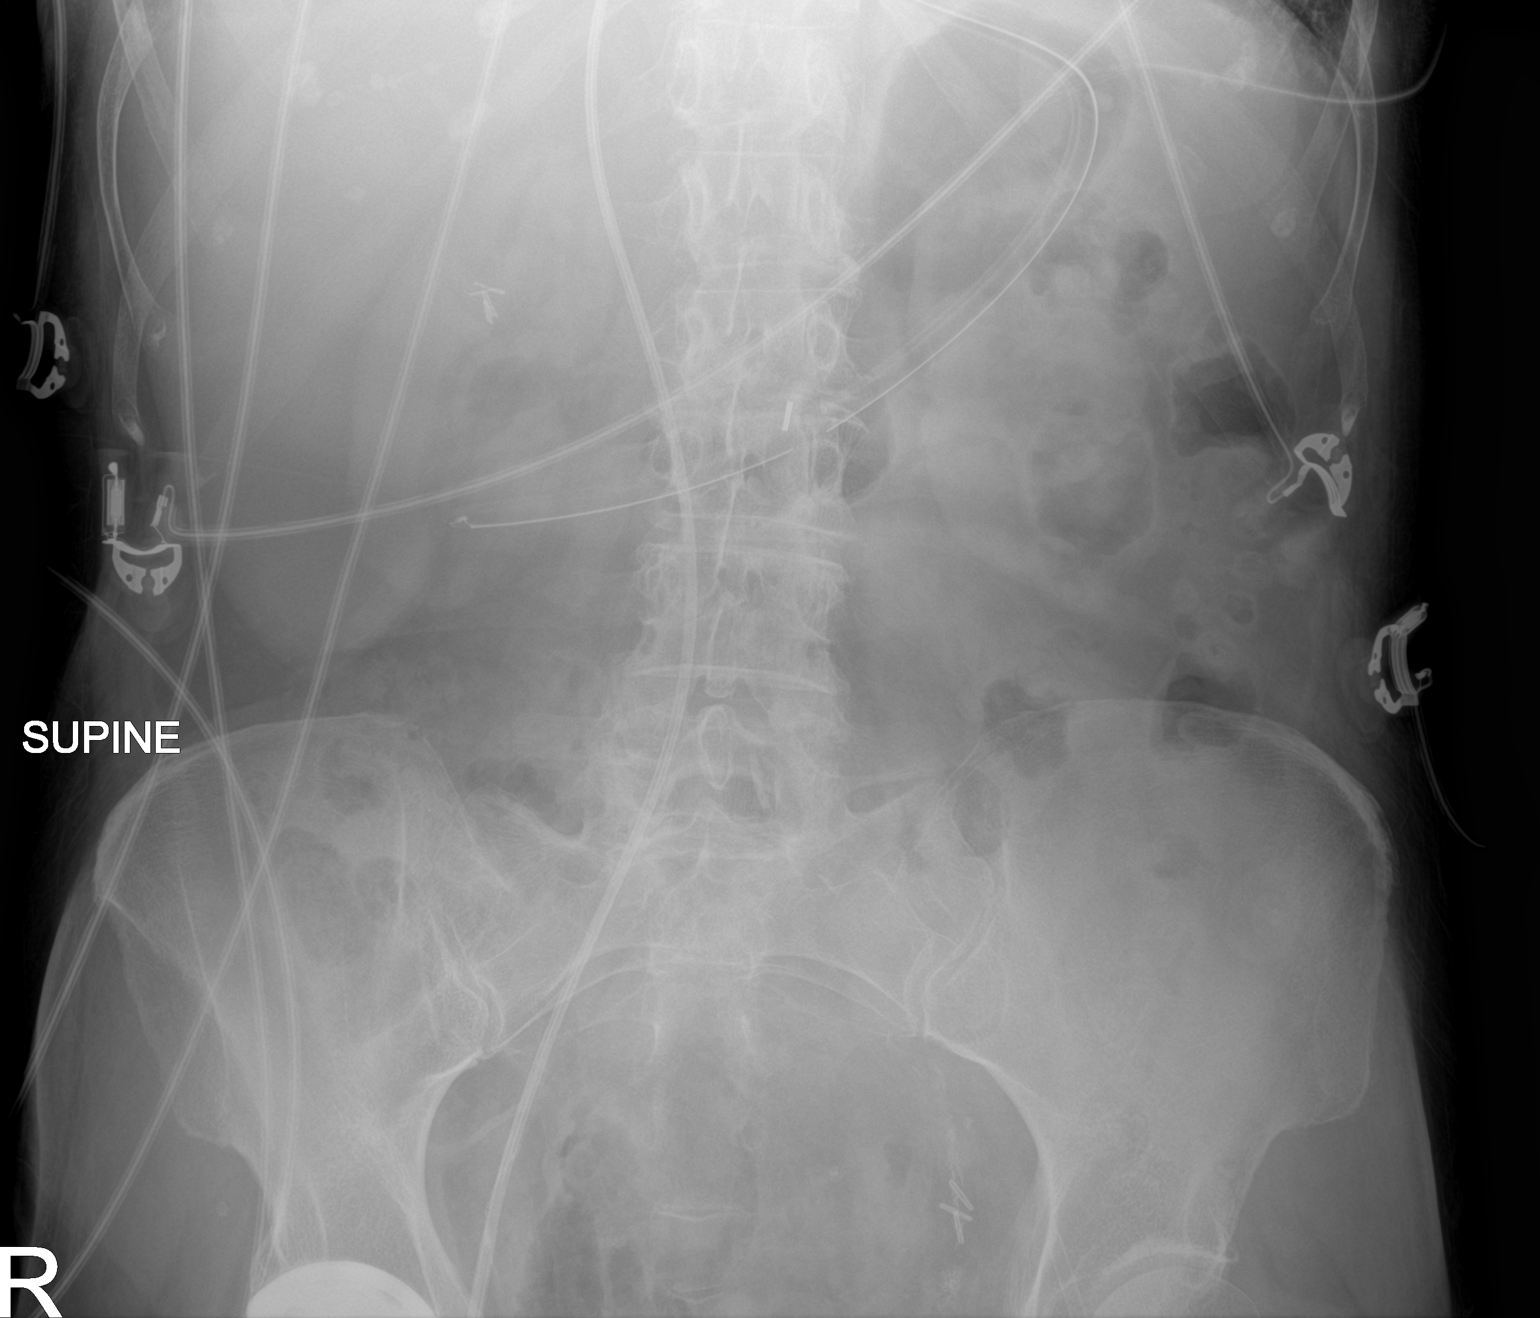

[1 of 1 positions shown; findings below may reference images not displayed]

FINDINGS: Orogastric tube tip is at the level of the gastric antrum. No
dilated bowel loops are seen. There are surgical clips in the right
abdomen and left pelvis.
IMPRESSION: 1. Orogastric tube tip at the level of the gastric antrum.

## 2023-04-08 IMAGING — DX DG CHEST 1V PORT
1 series · 1 of 1 positions shown · non-contrast
Comparison: Chest x-ray 02/02/2022.

CLINICAL DATA: Intra-aortic balloon pump, OG tube.

EXAM:
PORTABLE CHEST 1 VIEW

[chest]
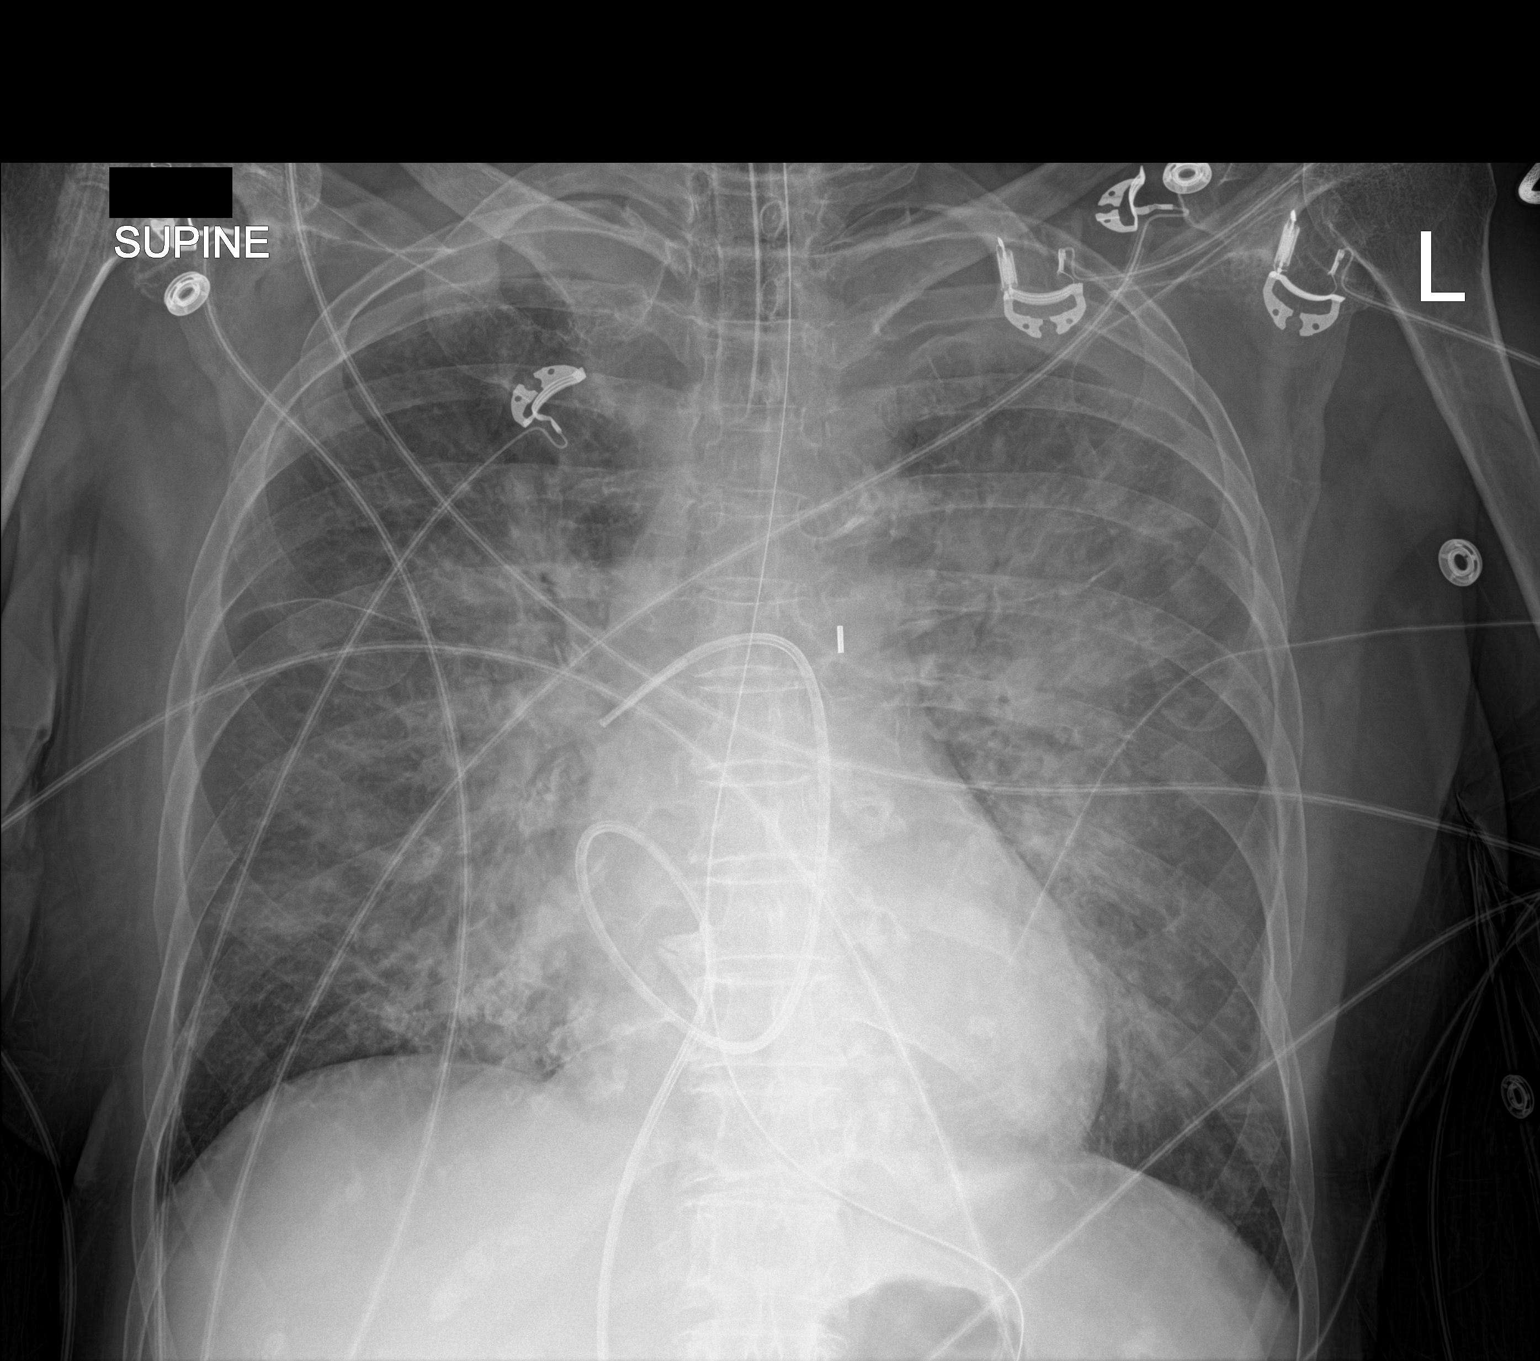

[1 of 1 positions shown; findings below may reference images not displayed]

FINDINGS: Endotracheal tube tip is 4 cm above the carina. Enteric tube extends
below the diaphragm. Swan-Ganz catheter tip projects over the right
main pulmonary artery. Intra-aortic balloon pump tip is 2 cm
inferior to the aortic arch.

Cardiomediastinal silhouette is within normal limits. There are
patchy airspace opacities centrally in the left upper lobe and right
lower lung which have increased from prior. There are new bilateral
pleural effusions seen near the lung apices. There is no evidence
for pneumothorax on the supine view. No acute fractures are seen.
IMPRESSION: 1. Endotracheal tube tip 4 cm above carina.
2. Intra-aortic balloon pump tip 2 cm inferior to aortic arch.
3. Swan-Ganz catheter tip over the right main pulmonary artery.
4. New central bilateral airspace disease worrisome for edema,
infection or hemorrhage.

## 2023-04-09 IMAGING — DX DG CHEST 1V PORT
1 series · 1 of 1 positions shown · non-contrast
Comparison: 02/03/2022

CLINICAL DATA: Myocardial infarction.

EXAM:
PORTABLE CHEST 1 VIEW

[chest ap]
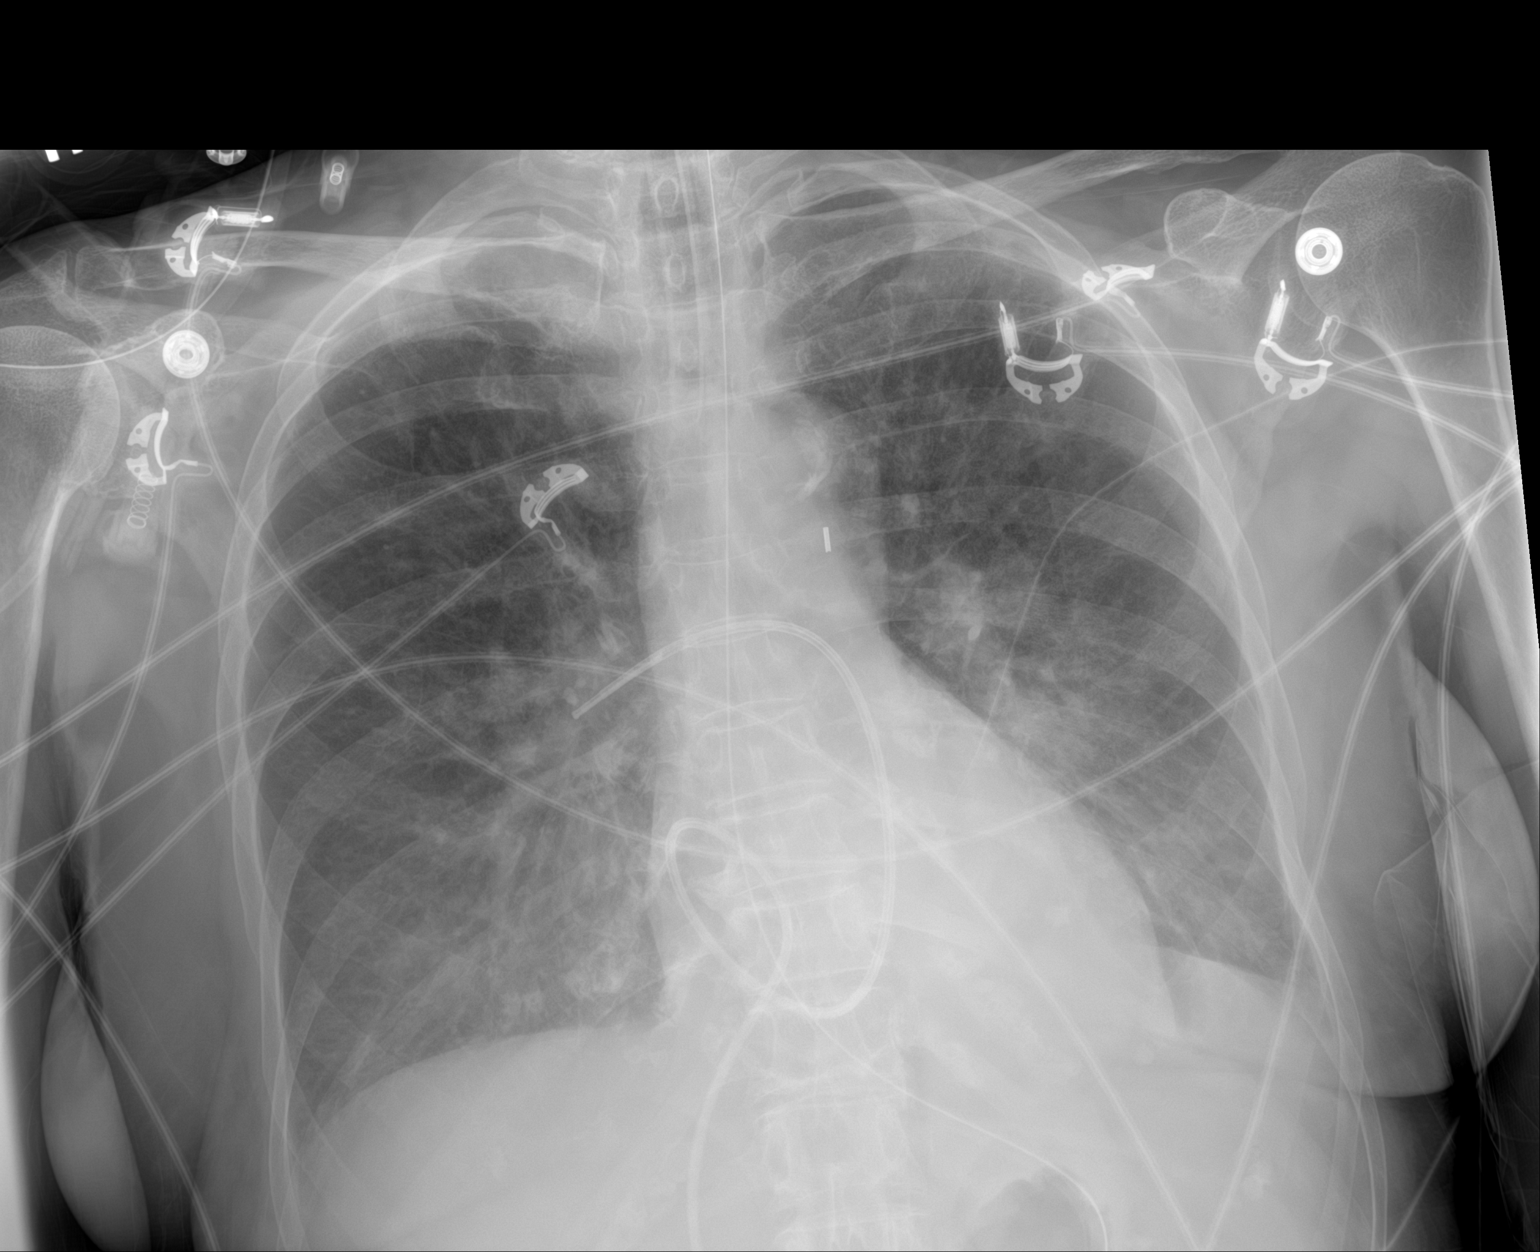

[1 of 1 positions shown; findings below may reference images not displayed]

FINDINGS: The endotracheal tube, NG tube, swan Ganz catheter and IABP are in
good position, unchanged.

The cardiac silhouette, mediastinal and hilar contours are within
normal limits. The lungs demonstrate resolving perihilar pattern of
pulmonary edema. Possible small left effusion. No pneumothorax.
IMPRESSION: 1. Stable support apparatus.
2. Resolving perihilar pattern of pulmonary edema.

## 2023-04-11 IMAGING — DX DG CHEST 1V PORT
1 series · 1 of 1 positions shown · non-contrast
Comparison: Portable chest yesterday at [DATE] a.m.

CLINICAL DATA: Encounter for intra-balloon bone management.

EXAM:
PORTABLE CHEST 1 VIEW

[chest ap]
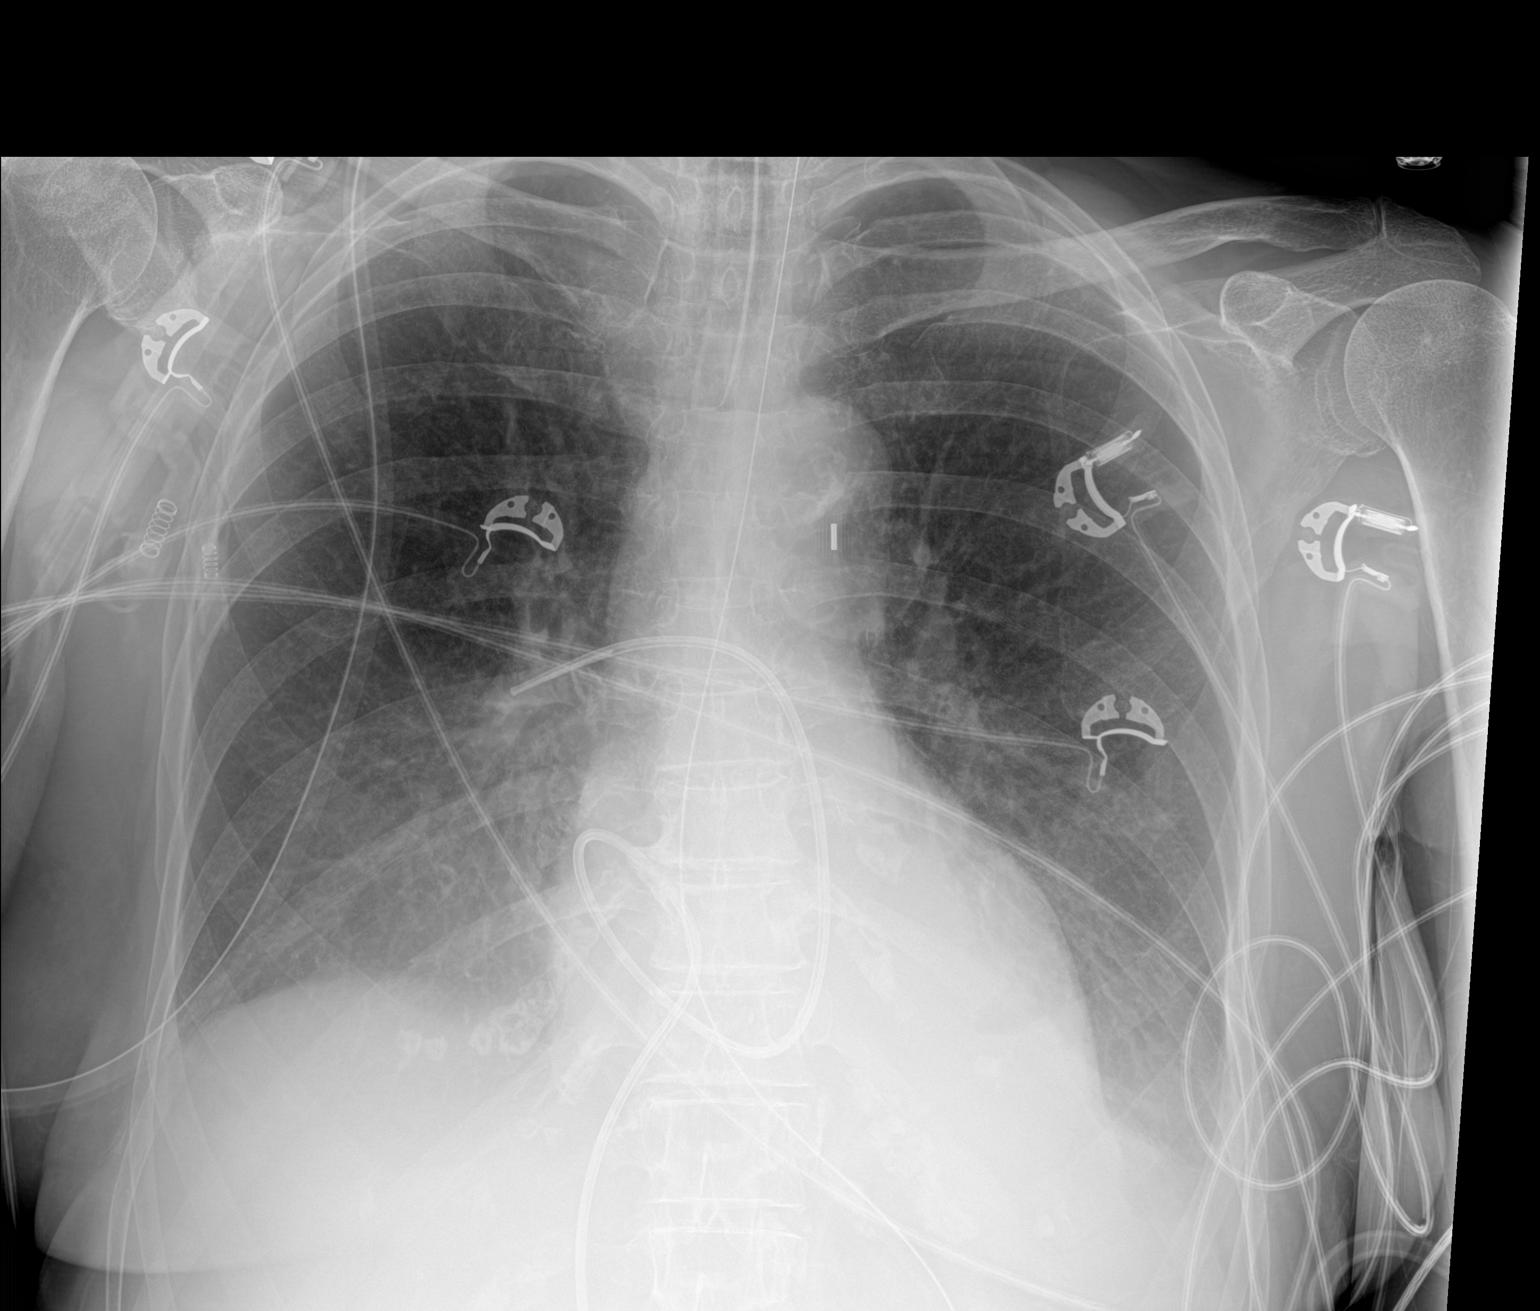

[1 of 1 positions shown; findings below may reference images not displayed]

FINDINGS: [DATE] a.m., 02/06/2022. Swan-Ganz catheter approaching from
inferiorly again has its tip in the distal right pulmonary artery.

IABP radiopaque marker again seen just below the aortic knob,
stable. There is aortic atherosclerosis with stable mediastinum.

ETT tip remains 4.6 cm from the carina with NGT entering the stomach
but not fully included.

Cardiac size is stable. Mild perihilar vascular congestion and mild
lower zonal interstitial edema persist with small underlying pleural
effusions and hazy overlying opacities in the lower lung fields,
consistent with atelectasis, pneumonitis or edema.

The mid and upper lung fields remain clear. The overall aeration
seems unchanged
IMPRESSION: 1. All support devices are unaltered in positioning.
2. Small pleural effusions with stable mild perihilar vascular
congestion, lower zonal interstitial edema and stable overlying hazy
opacity of the lower lung fields.

## 2023-04-12 IMAGING — DX DG CHEST 1V PORT
1 series · 1 of 1 positions shown · non-contrast
Comparison: Yesterday

CLINICAL DATA: Status post CABG

EXAM:
PORTABLE CHEST 1 VIEW

[chest]
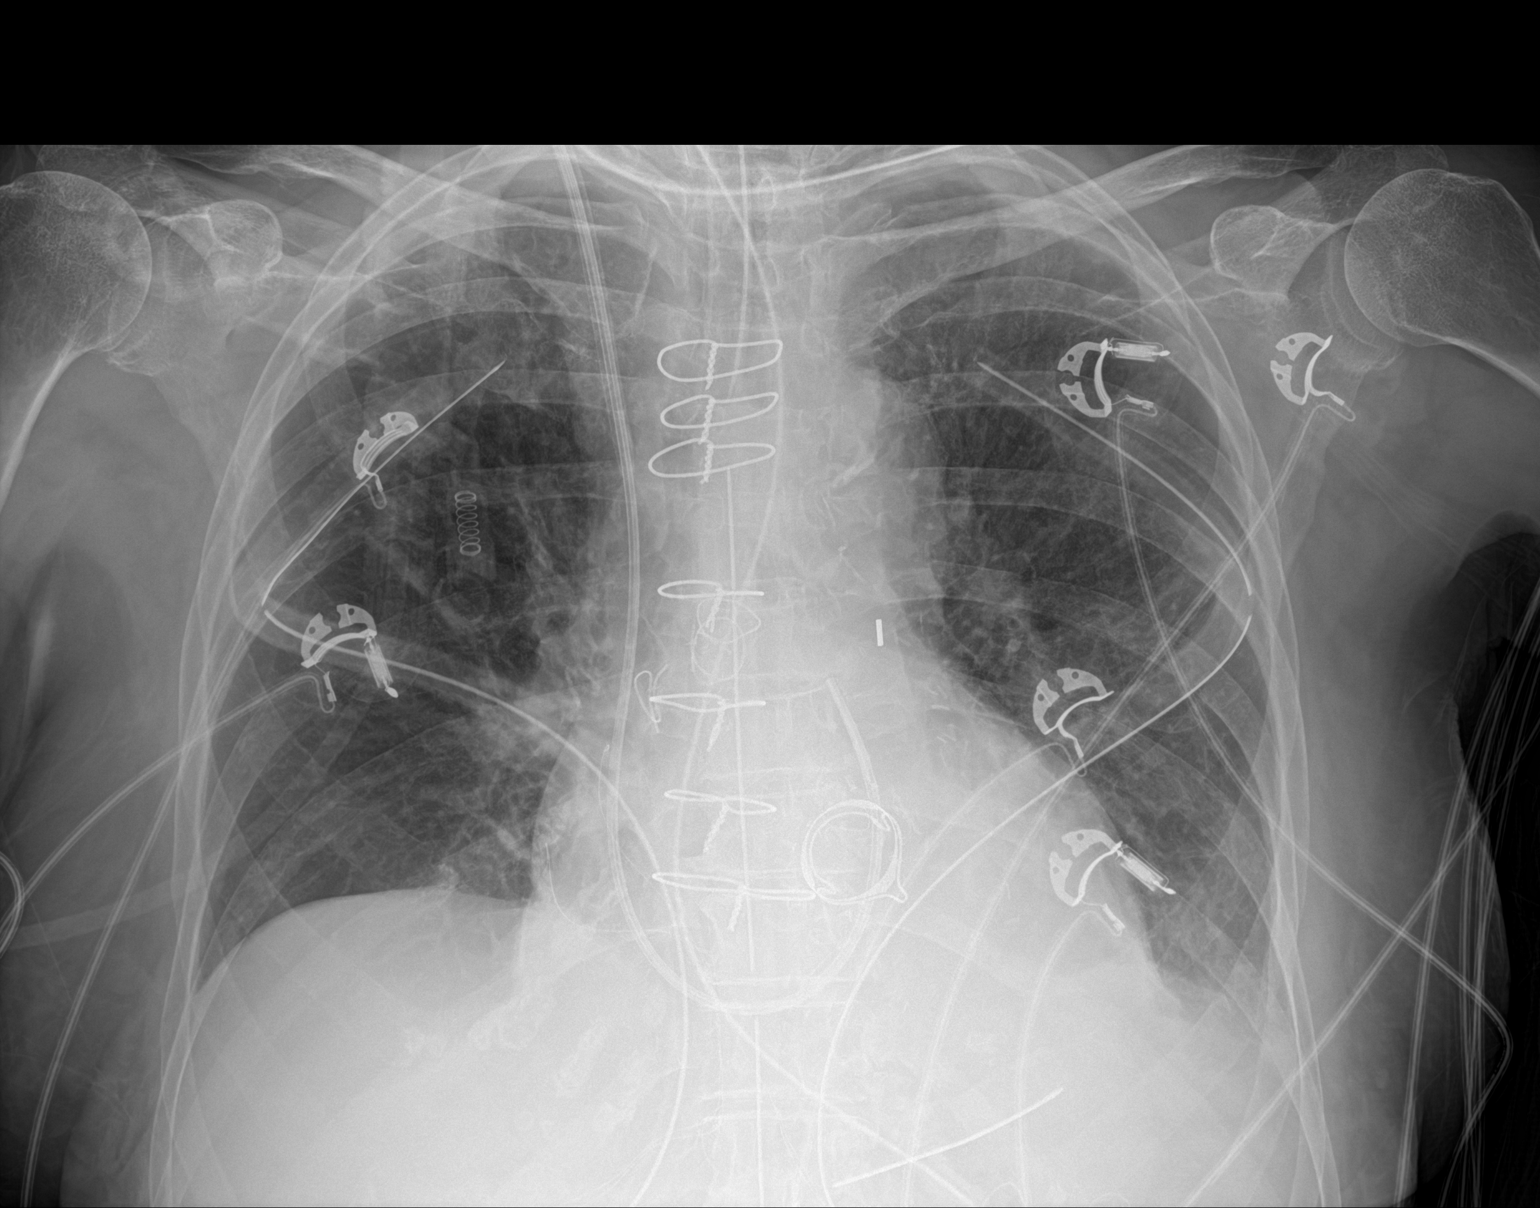

[1 of 1 positions shown; findings below may reference images not displayed]

FINDINGS: Endotracheal tube with tip just below the clavicular heads. An
enteric tube reaches the stomach. Multiple chest drains in place.
Swan-Ganz catheter from the right with tip the pulmonary artery
outflow level. Aortic balloon pump with tip 6.2 cm below the top of
the aortic knob. CABG and mitral valve replacement. Mild atelectasis
and small volume pleural fluid. Worsening aeration at the left base
where the diaphragm is no longer seen. No visible pneumothorax.
IMPRESSION: Unchanged hardware positioning.

Mild increase in atelectasis at the left base. No visible
pneumothorax.
